# Patient Record
Sex: Male | Born: 1960 | Race: White | Hispanic: No | Marital: Married | Smoking: Former smoker
Health system: Southern US, Community
[De-identification: ages and names within clinical notes are randomized; demographics above are authoritative.]

## PROBLEM LIST (undated history)

## (undated) DIAGNOSIS — N529 Male erectile dysfunction, unspecified: Secondary | ICD-10-CM

## (undated) DIAGNOSIS — F419 Anxiety disorder, unspecified: Secondary | ICD-10-CM

## (undated) DIAGNOSIS — I10 Essential (primary) hypertension: Secondary | ICD-10-CM

## (undated) DIAGNOSIS — R0602 Shortness of breath: Secondary | ICD-10-CM

## (undated) DIAGNOSIS — H1045 Other chronic allergic conjunctivitis: Secondary | ICD-10-CM

## (undated) DIAGNOSIS — E785 Hyperlipidemia, unspecified: Secondary | ICD-10-CM

## (undated) DIAGNOSIS — J309 Allergic rhinitis, unspecified: Secondary | ICD-10-CM

## (undated) DIAGNOSIS — I509 Heart failure, unspecified: Secondary | ICD-10-CM

## (undated) DIAGNOSIS — Z860100 Personal history of colon polyps, unspecified: Secondary | ICD-10-CM

## (undated) DIAGNOSIS — G4733 Obstructive sleep apnea (adult) (pediatric): Secondary | ICD-10-CM

## (undated) DIAGNOSIS — E669 Obesity, unspecified: Secondary | ICD-10-CM

## (undated) DIAGNOSIS — M2550 Pain in unspecified joint: Secondary | ICD-10-CM

## (undated) DIAGNOSIS — IMO0002 Reserved for concepts with insufficient information to code with codable children: Secondary | ICD-10-CM

## (undated) DIAGNOSIS — I5032 Chronic diastolic (congestive) heart failure: Secondary | ICD-10-CM

## (undated) DIAGNOSIS — I2510 Atherosclerotic heart disease of native coronary artery without angina pectoris: Secondary | ICD-10-CM

## (undated) HISTORY — PX: RETINAL DETACHMENT SURGERY: SHX105

## (undated) HISTORY — PX: QUADRICEPS REPAIR: SHX2281

## (undated) HISTORY — DX: Anxiety disorder, unspecified: F41.9

## (undated) HISTORY — PX: APPENDECTOMY: SHX54

## (undated) HISTORY — DX: Obesity, unspecified: E66.9

## (undated) HISTORY — DX: Pain in unspecified joint: M25.50

## (undated) HISTORY — PX: SPINE SURGERY: SHX786

## (undated) HISTORY — DX: Male erectile dysfunction, unspecified: N52.9

## (undated) HISTORY — DX: Obstructive sleep apnea (adult) (pediatric): G47.33

## (undated) HISTORY — PX: TONSILLECTOMY AND ADENOIDECTOMY: SHX28

## (undated) HISTORY — DX: Chronic diastolic (congestive) heart failure: I50.32

## (undated) HISTORY — DX: Personal history of colon polyps, unspecified: Z86.0100

## (undated) HISTORY — PX: QUADRICEPS TENDON REPAIR: SHX756

## (undated) HISTORY — PX: ROTATOR CUFF REPAIR: SHX139

## (undated) HISTORY — DX: Heart failure, unspecified: I50.9

## (undated) HISTORY — PX: CATARACT EXTRACTION: SUR2

## (undated) HISTORY — DX: Reserved for concepts with insufficient information to code with codable children: IMO0002

## (undated) HISTORY — DX: Hyperlipidemia, unspecified: E78.5

## (undated) HISTORY — PX: BACK SURGERY: SHX140

## (undated) HISTORY — DX: Shortness of breath: R06.02

## (undated) HISTORY — DX: Allergic rhinitis, unspecified: J30.9

## (undated) HISTORY — DX: Other chronic allergic conjunctivitis: H10.45

## (undated) HISTORY — DX: Personal history of colonic polyps: Z86.010

## (undated) HISTORY — DX: Essential (primary) hypertension: I10

---
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
## 1978-07-11 HISTORY — PX: OTHER SURGICAL HISTORY: SHX169

## 1997-11-04 ENCOUNTER — Ambulatory Visit (HOSPITAL_COMMUNITY): Admission: RE | Admit: 1997-11-04 | Discharge: 1997-11-04 | Payer: Self-pay | Admitting: *Deleted

## 1997-11-12 ENCOUNTER — Ambulatory Visit (HOSPITAL_COMMUNITY): Admission: RE | Admit: 1997-11-12 | Discharge: 1997-11-12 | Payer: Self-pay | Admitting: *Deleted

## 1997-12-19 ENCOUNTER — Encounter (HOSPITAL_COMMUNITY): Admission: RE | Admit: 1997-12-19 | Discharge: 1998-03-19 | Payer: Self-pay | Admitting: *Deleted

## 1999-05-17 ENCOUNTER — Encounter: Payer: Self-pay | Admitting: Emergency Medicine

## 1999-05-17 ENCOUNTER — Inpatient Hospital Stay (HOSPITAL_COMMUNITY): Admission: EM | Admit: 1999-05-17 | Discharge: 1999-05-19 | Payer: Self-pay | Admitting: Emergency Medicine

## 1999-05-27 ENCOUNTER — Encounter (INDEPENDENT_AMBULATORY_CARE_PROVIDER_SITE_OTHER): Payer: Self-pay | Admitting: Specialist

## 1999-05-27 ENCOUNTER — Ambulatory Visit (HOSPITAL_COMMUNITY): Admission: RE | Admit: 1999-05-27 | Discharge: 1999-05-27 | Payer: Self-pay | Admitting: Gastroenterology

## 2000-05-25 ENCOUNTER — Encounter: Payer: Self-pay | Admitting: Family Medicine

## 2000-05-25 LAB — CONVERTED CEMR LAB
Blood Glucose, Fasting: 134 mg/dL
Hgb A1c MFr Bld: 9.7 %

## 2000-05-26 ENCOUNTER — Encounter: Payer: Self-pay | Admitting: Family Medicine

## 2000-05-26 LAB — CONVERTED CEMR LAB: Microalbumin U total vol: 2.8 mg/L

## 2000-05-31 ENCOUNTER — Encounter: Payer: Self-pay | Admitting: Family Medicine

## 2001-02-23 ENCOUNTER — Encounter: Payer: Self-pay | Admitting: Family Medicine

## 2001-05-30 ENCOUNTER — Encounter: Payer: Self-pay | Admitting: Family Medicine

## 2001-05-30 LAB — CONVERTED CEMR LAB: Blood Glucose, Fasting: 160 mg/dL

## 2001-05-31 ENCOUNTER — Encounter: Payer: Self-pay | Admitting: Family Medicine

## 2001-05-31 LAB — CONVERTED CEMR LAB: Hgb A1c MFr Bld: 9.4 %

## 2001-09-13 ENCOUNTER — Encounter: Payer: Self-pay | Admitting: Family Medicine

## 2001-09-13 LAB — CONVERTED CEMR LAB: RBC count: 4.88 10*6/uL

## 2001-10-05 ENCOUNTER — Inpatient Hospital Stay (HOSPITAL_COMMUNITY): Admission: AD | Admit: 2001-10-05 | Discharge: 2001-10-06 | Payer: Self-pay | Admitting: Cardiology

## 2001-11-23 ENCOUNTER — Encounter: Payer: Self-pay | Admitting: Family Medicine

## 2001-11-23 LAB — CONVERTED CEMR LAB: Hgb A1c MFr Bld: 8.8 %

## 2002-02-12 ENCOUNTER — Encounter: Payer: Self-pay | Admitting: Family Medicine

## 2002-02-12 LAB — CONVERTED CEMR LAB: Hgb A1c MFr Bld: 9.2 %

## 2002-07-31 ENCOUNTER — Encounter: Payer: Self-pay | Admitting: Family Medicine

## 2002-07-31 LAB — CONVERTED CEMR LAB
Hgb A1c MFr Bld: 11.6 %
Microalbumin U total vol: 6.2 mg/L

## 2002-08-01 ENCOUNTER — Encounter: Payer: Self-pay | Admitting: Family Medicine

## 2002-08-01 LAB — CONVERTED CEMR LAB: TSH: 3.13 microintl units/mL

## 2003-01-29 ENCOUNTER — Encounter: Payer: Self-pay | Admitting: Family Medicine

## 2003-05-06 ENCOUNTER — Encounter: Payer: Self-pay | Admitting: Family Medicine

## 2003-05-06 LAB — CONVERTED CEMR LAB
Blood Glucose, Fasting: 174 mg/dL
Hgb A1c MFr Bld: 10.3 %

## 2003-08-06 ENCOUNTER — Encounter: Admission: RE | Admit: 2003-08-06 | Discharge: 2003-08-06 | Payer: Self-pay | Admitting: Neurosurgery

## 2003-08-20 ENCOUNTER — Encounter: Admission: RE | Admit: 2003-08-20 | Discharge: 2003-08-20 | Payer: Self-pay | Admitting: Neurosurgery

## 2003-08-28 ENCOUNTER — Encounter: Admission: RE | Admit: 2003-08-28 | Discharge: 2003-08-28 | Payer: Self-pay | Admitting: Neurosurgery

## 2003-11-11 ENCOUNTER — Encounter: Payer: Self-pay | Admitting: Family Medicine

## 2003-11-13 ENCOUNTER — Inpatient Hospital Stay (HOSPITAL_COMMUNITY): Admission: RE | Admit: 2003-11-13 | Discharge: 2003-11-17 | Payer: Self-pay | Admitting: Neurosurgery

## 2004-05-21 ENCOUNTER — Ambulatory Visit: Payer: Self-pay | Admitting: Family Medicine

## 2004-06-10 ENCOUNTER — Ambulatory Visit: Payer: Self-pay | Admitting: Family Medicine

## 2004-06-10 LAB — CONVERTED CEMR LAB
Blood Glucose, Fasting: 280 mg/dL
Microalbumin U total vol: 3.3 mg/L

## 2004-06-14 ENCOUNTER — Ambulatory Visit: Payer: Self-pay | Admitting: Family Medicine

## 2004-08-02 ENCOUNTER — Ambulatory Visit (HOSPITAL_COMMUNITY): Admission: RE | Admit: 2004-08-02 | Discharge: 2004-08-02 | Payer: Self-pay | Admitting: Gastroenterology

## 2004-08-16 ENCOUNTER — Ambulatory Visit (HOSPITAL_COMMUNITY): Admission: RE | Admit: 2004-08-16 | Discharge: 2004-08-16 | Payer: Self-pay | Admitting: Gastroenterology

## 2004-09-08 ENCOUNTER — Ambulatory Visit: Payer: Self-pay | Admitting: Family Medicine

## 2004-09-13 ENCOUNTER — Ambulatory Visit: Payer: Self-pay | Admitting: Family Medicine

## 2004-10-26 ENCOUNTER — Ambulatory Visit: Payer: Self-pay | Admitting: Family Medicine

## 2004-12-31 ENCOUNTER — Ambulatory Visit: Payer: Self-pay | Admitting: Family Medicine

## 2005-01-03 ENCOUNTER — Ambulatory Visit: Payer: Self-pay | Admitting: Family Medicine

## 2005-01-12 ENCOUNTER — Ambulatory Visit: Payer: Self-pay | Admitting: Family Medicine

## 2005-01-21 ENCOUNTER — Ambulatory Visit: Payer: Self-pay | Admitting: Family Medicine

## 2005-03-08 ENCOUNTER — Ambulatory Visit: Payer: Self-pay | Admitting: Family Medicine

## 2005-05-11 ENCOUNTER — Ambulatory Visit: Payer: Self-pay | Admitting: Family Medicine

## 2005-06-08 ENCOUNTER — Ambulatory Visit: Payer: Self-pay | Admitting: Family Medicine

## 2005-06-08 LAB — CONVERTED CEMR LAB
Blood Glucose, Fasting: 111 mg/dL
Hgb A1c MFr Bld: 9.2 %

## 2005-06-10 ENCOUNTER — Ambulatory Visit: Payer: Self-pay | Admitting: Family Medicine

## 2005-09-06 ENCOUNTER — Ambulatory Visit: Payer: Self-pay | Admitting: Family Medicine

## 2005-09-08 ENCOUNTER — Ambulatory Visit: Payer: Self-pay | Admitting: Family Medicine

## 2006-07-24 ENCOUNTER — Ambulatory Visit: Payer: Self-pay | Admitting: Family Medicine

## 2006-07-24 LAB — CONVERTED CEMR LAB
ALT: 17 units/L (ref 0–40)
AST: 14 units/L (ref 0–37)
Albumin: 3.5 g/dL (ref 3.5–5.2)
Alkaline Phosphatase: 61 units/L (ref 39–117)
Chloride: 104 meq/L (ref 96–112)
Glucose, Bld: 260 mg/dL — ABNORMAL HIGH (ref 70–99)
HDL: 40 mg/dL (ref >39.0)
LDL Cholesterol: 108 mg/dL — ABNORMAL HIGH (ref 0–99)
Microalb, Ur: 1 mg/dL (ref 0.0–1.9)
Potassium: 4.4 meq/L (ref 3.5–5.1)
Sodium: 140 meq/L (ref 135–145)
TSH: 3.74 microintl units/mL (ref 0.35–5.50)
Total Bilirubin: 0.7 mg/dL (ref 0.3–1.2)
VLDL: 18 mg/dL (ref 0–40)

## 2006-07-28 ENCOUNTER — Ambulatory Visit: Payer: Self-pay | Admitting: Family Medicine

## 2006-08-11 ENCOUNTER — Ambulatory Visit: Payer: Self-pay | Admitting: Family Medicine

## 2006-12-02 ENCOUNTER — Encounter: Admission: RE | Admit: 2006-12-02 | Discharge: 2006-12-02 | Payer: Self-pay | Admitting: Orthopaedic Surgery

## 2006-12-22 ENCOUNTER — Ambulatory Visit (HOSPITAL_COMMUNITY): Admission: RE | Admit: 2006-12-22 | Discharge: 2006-12-22 | Payer: Self-pay | Admitting: Orthopaedic Surgery

## 2007-02-14 ENCOUNTER — Ambulatory Visit: Payer: Self-pay | Admitting: Family Medicine

## 2007-02-15 ENCOUNTER — Ambulatory Visit: Payer: Self-pay | Admitting: Physical Medicine & Rehabilitation

## 2007-02-17 ENCOUNTER — Ambulatory Visit: Payer: Self-pay | Admitting: Family Medicine

## 2007-02-17 ENCOUNTER — Inpatient Hospital Stay (HOSPITAL_COMMUNITY): Admission: EM | Admit: 2007-02-17 | Discharge: 2007-02-20 | Payer: Self-pay | Admitting: Emergency Medicine

## 2007-02-27 ENCOUNTER — Ambulatory Visit: Payer: Self-pay | Admitting: Family Medicine

## 2007-03-27 ENCOUNTER — Emergency Department (HOSPITAL_COMMUNITY): Admission: EM | Admit: 2007-03-27 | Discharge: 2007-03-27 | Payer: Self-pay | Admitting: Emergency Medicine

## 2007-03-29 ENCOUNTER — Encounter: Admission: RE | Admit: 2007-03-29 | Discharge: 2007-03-29 | Payer: Self-pay | Admitting: Orthopaedic Surgery

## 2007-04-02 ENCOUNTER — Inpatient Hospital Stay (HOSPITAL_COMMUNITY): Admission: RE | Admit: 2007-04-02 | Discharge: 2007-04-04 | Payer: Self-pay | Admitting: Orthopaedic Surgery

## 2007-11-30 ENCOUNTER — Encounter: Payer: Self-pay | Admitting: Family Medicine

## 2007-11-30 DIAGNOSIS — E785 Hyperlipidemia, unspecified: Secondary | ICD-10-CM | POA: Insufficient documentation

## 2007-11-30 DIAGNOSIS — E669 Obesity, unspecified: Secondary | ICD-10-CM | POA: Insufficient documentation

## 2007-11-30 DIAGNOSIS — I10 Essential (primary) hypertension: Secondary | ICD-10-CM | POA: Insufficient documentation

## 2007-11-30 DIAGNOSIS — K3184 Gastroparesis: Secondary | ICD-10-CM | POA: Insufficient documentation

## 2008-03-31 ENCOUNTER — Encounter: Admission: RE | Admit: 2008-03-31 | Discharge: 2008-03-31 | Payer: Self-pay | Admitting: Neurosurgery

## 2008-08-04 ENCOUNTER — Inpatient Hospital Stay (HOSPITAL_COMMUNITY): Admission: RE | Admit: 2008-08-04 | Discharge: 2008-08-08 | Payer: Self-pay | Admitting: Neurosurgery

## 2008-08-04 ENCOUNTER — Ambulatory Visit: Payer: Self-pay | Admitting: Internal Medicine

## 2008-12-31 ENCOUNTER — Telehealth: Payer: Self-pay | Admitting: Family Medicine

## 2009-01-08 HISTORY — PX: OTHER SURGICAL HISTORY: SHX169

## 2009-01-10 ENCOUNTER — Observation Stay (HOSPITAL_COMMUNITY): Admission: EM | Admit: 2009-01-10 | Discharge: 2009-01-11 | Payer: Self-pay | Admitting: Emergency Medicine

## 2009-01-10 ENCOUNTER — Ambulatory Visit: Payer: Self-pay | Admitting: Internal Medicine

## 2009-02-05 ENCOUNTER — Ambulatory Visit (HOSPITAL_COMMUNITY): Admission: RE | Admit: 2009-02-05 | Discharge: 2009-02-06 | Payer: Self-pay | Admitting: Cardiology

## 2009-06-03 IMAGING — CR DG KNEE 4+V BILAT
8 series · 8 of 8 positions shown · non-contrast
Comparison: None.

CLINICAL DATA: Trauma. 
LEFT KNEE ? 4 VIEW:

[t knee ap left]
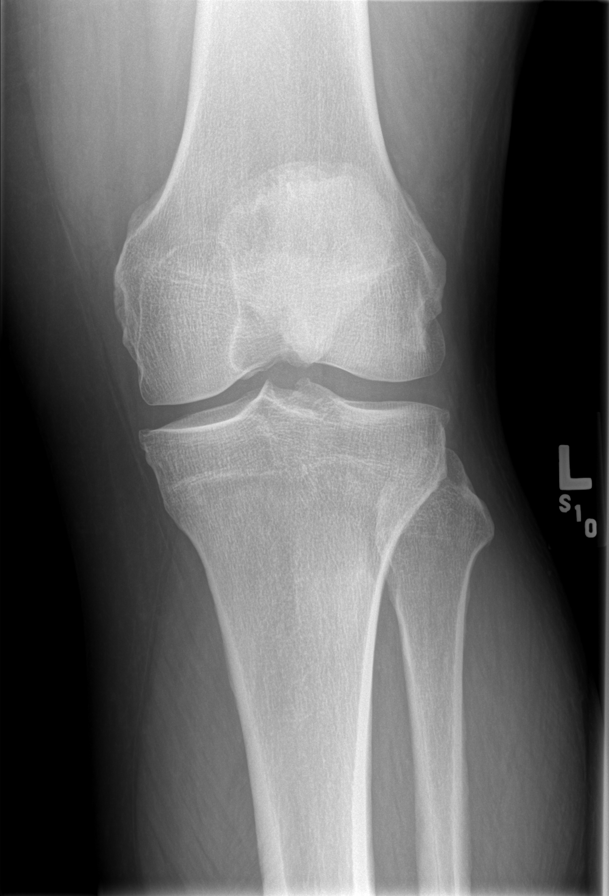

[t knee oblique left (1 of 2)]
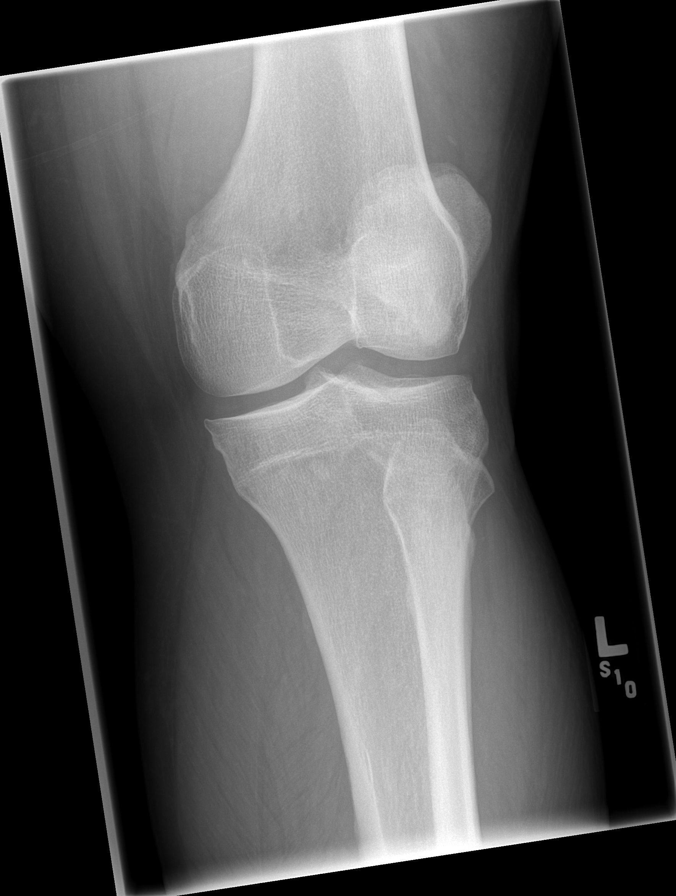

[t knee oblique left (2 of 2)]
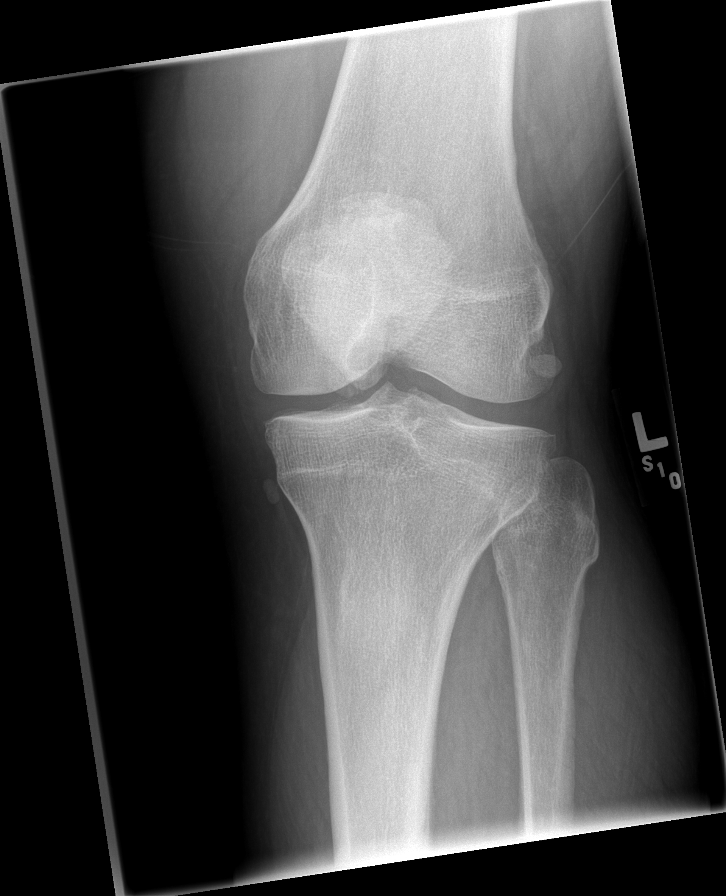

[t knee ap right]
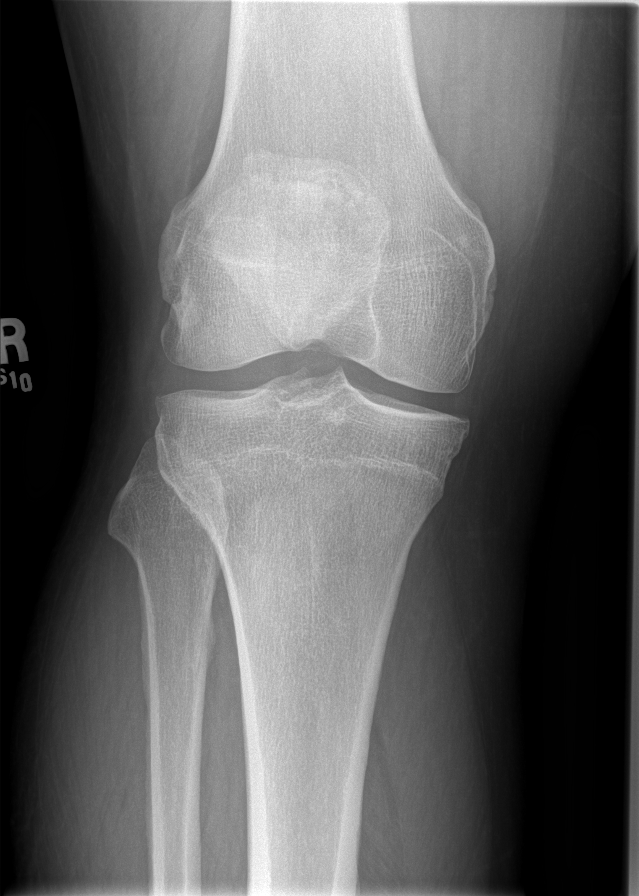

[t knee oblique right (1 of 2)]
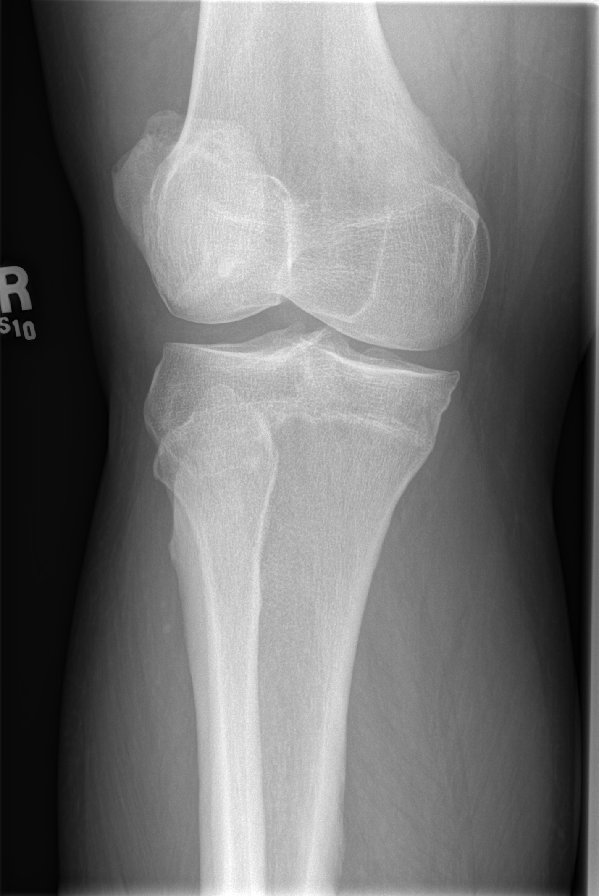

[t knee oblique right (2 of 2)]
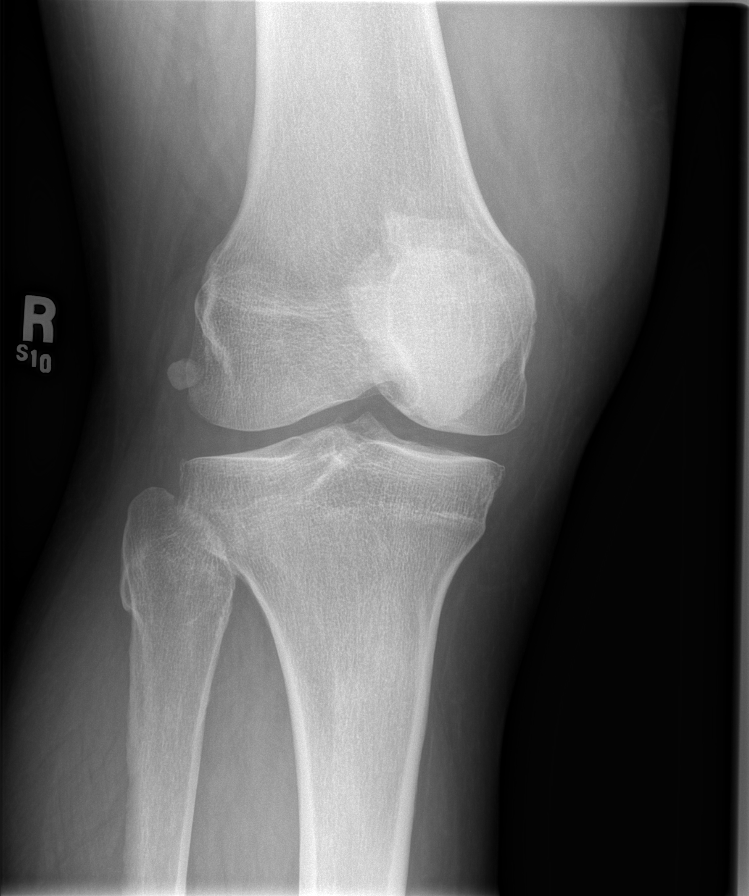

[t knee lat right]
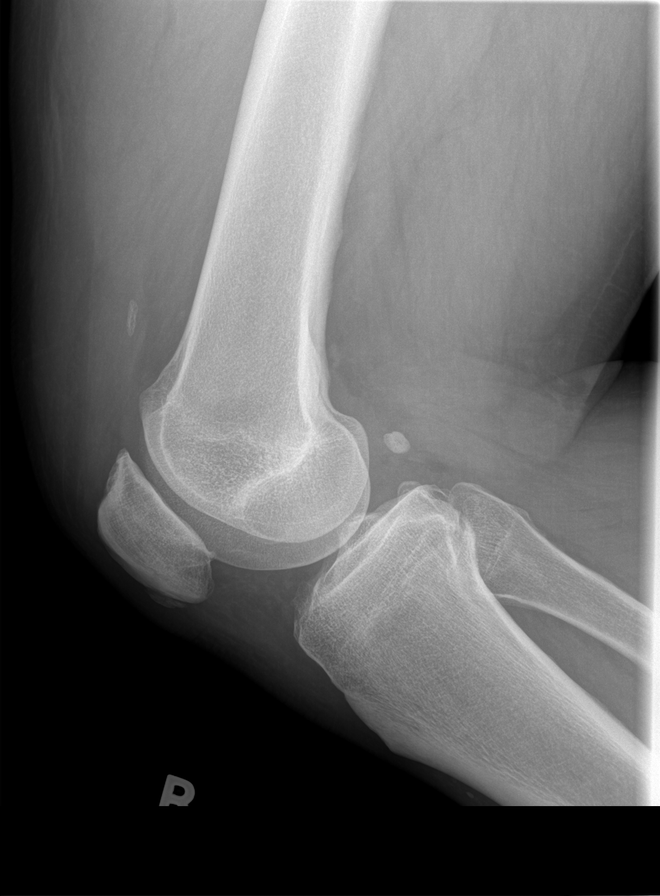

[t knee lat left]
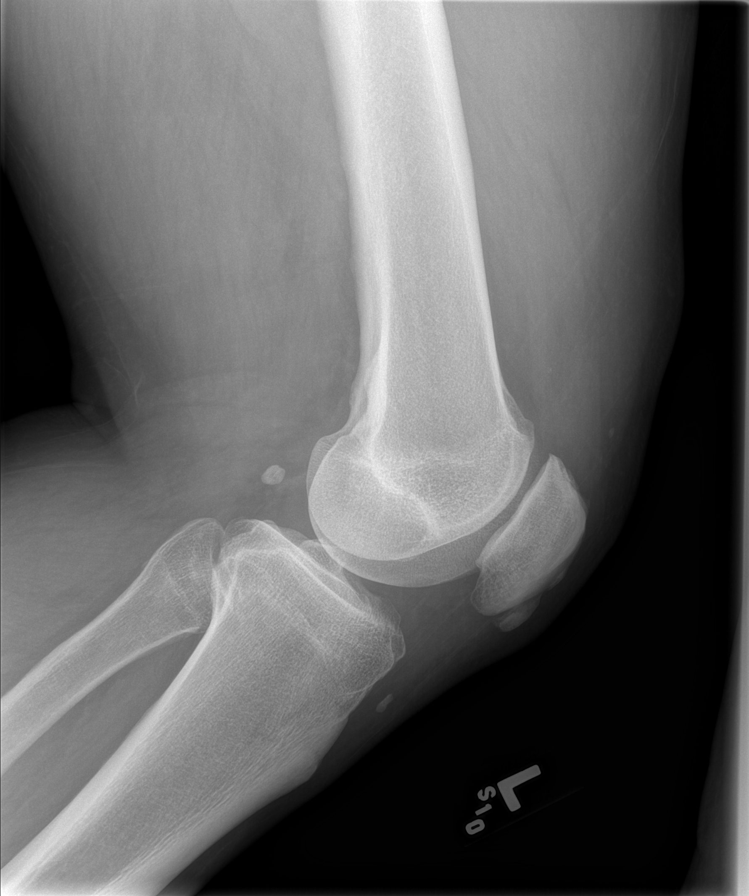

[8 of 8 positions shown; findings below may reference images not displayed]

Mild joint space narrowing involves the medial and lateral compartments.   Insertional enthesophyte at the patellar origin.   No acute fracture, dislocation, or joint effusion.
IMPRESSION: Degenerative change without acute finding in the left knee.
RIGHT KNEE ? 4 VIEW:  Mild medial and lateral compartment degenerative change.   Cannot exclude small joint effusion.   Increased density in distal quadriceps likely related to tendinopathy.   Mild patellofemoral joint osteoarthritis as well.
IMPRESSION: Mild degenerative changes three compartment without acute finding.

## 2009-06-11 ENCOUNTER — Telehealth: Payer: Self-pay | Admitting: Family Medicine

## 2009-06-26 ENCOUNTER — Ambulatory Visit (HOSPITAL_COMMUNITY): Admission: RE | Admit: 2009-06-26 | Discharge: 2009-06-26 | Payer: Self-pay | Admitting: Surgery

## 2009-07-01 ENCOUNTER — Ambulatory Visit (HOSPITAL_COMMUNITY): Admission: RE | Admit: 2009-07-01 | Discharge: 2009-07-01 | Payer: Self-pay | Admitting: Surgery

## 2009-07-11 HISTORY — PX: COLONOSCOPY: SHX174

## 2009-08-04 ENCOUNTER — Encounter: Admission: RE | Admit: 2009-08-04 | Discharge: 2009-11-02 | Payer: Self-pay | Admitting: Surgery

## 2009-09-15 ENCOUNTER — Ambulatory Visit (HOSPITAL_COMMUNITY): Admission: RE | Admit: 2009-09-15 | Discharge: 2009-09-16 | Payer: Self-pay | Admitting: Surgery

## 2009-09-15 HISTORY — PX: LAPAROSCOPIC GASTRIC BANDING: SHX1100

## 2009-11-17 ENCOUNTER — Encounter: Admission: RE | Admit: 2009-11-17 | Discharge: 2009-11-17 | Payer: Self-pay | Admitting: Surgery

## 2010-01-25 ENCOUNTER — Encounter: Admission: RE | Admit: 2010-01-25 | Discharge: 2010-01-25 | Payer: Self-pay | Admitting: Surgery

## 2010-07-11 HISTORY — PX: OTHER SURGICAL HISTORY: SHX169

## 2010-10-04 LAB — CBC
HCT: 35.1 % — ABNORMAL LOW (ref 39.0–52.0)
HCT: 41.4 % (ref 39.0–52.0)
Hemoglobin: 13.3 g/dL (ref 13.0–17.0)
MCHC: 32.2 g/dL (ref 30.0–36.0)
MCHC: 32.8 g/dL (ref 30.0–36.0)
MCV: 88.9 fL (ref 78.0–100.0)
MCV: 90.3 fL (ref 78.0–100.0)
RBC: 3.88 MIL/uL — ABNORMAL LOW (ref 4.22–5.81)
RBC: 4.65 MIL/uL (ref 4.22–5.81)
WBC: 6.2 10*3/uL (ref 4.0–10.5)

## 2010-10-04 LAB — COMPREHENSIVE METABOLIC PANEL
ALT: 22 U/L (ref 0–53)
CO2: 29 mEq/L (ref 19–32)
Calcium: 9.4 mg/dL (ref 8.4–10.5)
Creatinine, Ser: 0.81 mg/dL (ref 0.4–1.5)
GFR calc non Af Amer: >60 mL/min (ref >60)
Glucose, Bld: 90 mg/dL (ref 70–99)
Sodium: 140 mEq/L (ref 135–145)

## 2010-10-04 LAB — GLUCOSE, CAPILLARY: Glucose-Capillary: 148 mg/dL — ABNORMAL HIGH (ref 70–99)

## 2010-10-04 LAB — DIFFERENTIAL
Basophils Relative: 1 % (ref 0–1)
Eosinophils Absolute: 0.1 10*3/uL (ref 0.0–0.7)
Eosinophils Absolute: 0.2 10*3/uL (ref 0.0–0.7)
Eosinophils Relative: 3 % (ref 0–5)
Lymphs Abs: 2.5 10*3/uL (ref 0.7–4.0)
Lymphs Abs: 2.7 10*3/uL (ref 0.7–4.0)
Monocytes Absolute: 0.5 10*3/uL (ref 0.1–1.0)
Monocytes Relative: 9 % (ref 3–12)
Neutrophils Relative %: 48 % (ref 43–77)
Neutrophils Relative %: 69 % (ref 43–77)

## 2010-10-04 LAB — HEMOGLOBIN AND HEMATOCRIT, BLOOD
HCT: 37.2 % — ABNORMAL LOW (ref 39.0–52.0)
Hemoglobin: 12.1 g/dL — ABNORMAL LOW (ref 13.0–17.0)

## 2010-10-17 LAB — BASIC METABOLIC PANEL
BUN: 17 mg/dL (ref 6–23)
BUN: 18 mg/dL (ref 6–23)
CO2: 30 mEq/L (ref 19–32)
CO2: 27 mEq/L (ref 19–32)
Chloride: 101 mEq/L (ref 96–112)
Chloride: 103 mEq/L (ref 96–112)
Chloride: 103 mEq/L (ref 96–112)
Creatinine, Ser: 0.78 mg/dL (ref 0.4–1.5)
GFR calc Af Amer: >60 mL/min (ref >60)
GFR calc Af Amer: >60 mL/min (ref >60)
GFR calc non Af Amer: >60 mL/min (ref >60)
Glucose, Bld: 234 mg/dL — ABNORMAL HIGH (ref 70–99)
Potassium: 3.9 mEq/L (ref 3.5–5.1)
Potassium: 4.3 mEq/L (ref 3.5–5.1)
Sodium: 139 mEq/L (ref 135–145)

## 2010-10-17 LAB — LIPID PANEL
Cholesterol: 108 mg/dL (ref 0–200)
HDL: 43 mg/dL (ref >39)
HDL: 36 mg/dL — ABNORMAL LOW (ref >39)
LDL Cholesterol: 58 mg/dL (ref 0–99)
Total CHOL/HDL Ratio: 2.6 RATIO
Total CHOL/HDL Ratio: 3 RATIO
Triglycerides: 44 mg/dL (ref <150)
Triglycerides: 68 mg/dL (ref <150)
VLDL: 14 mg/dL (ref 0–40)

## 2010-10-17 LAB — POCT I-STAT, CHEM 8
Calcium, Ion: 1.14 mmol/L (ref 1.12–1.32)
Chloride: 98 mEq/L (ref 96–112)
HCT: 45 % (ref 39.0–52.0)
Potassium: 4.2 mEq/L (ref 3.5–5.1)
Sodium: 141 mEq/L (ref 135–145)

## 2010-10-17 LAB — URINALYSIS, ROUTINE W REFLEX MICROSCOPIC
Glucose, UA: >1000 mg/dL — AB
Hgb urine dipstick: NEGATIVE
Specific Gravity, Urine: 1.031 — ABNORMAL HIGH (ref 1.005–1.030)

## 2010-10-17 LAB — CBC
HCT: 37.1 % — ABNORMAL LOW (ref 39.0–52.0)
HCT: 38 % — ABNORMAL LOW (ref 39.0–52.0)
HCT: 35.7 % — ABNORMAL LOW (ref 39.0–52.0)
Hemoglobin: 12.9 g/dL — ABNORMAL LOW (ref 13.0–17.0)
Hemoglobin: 12.3 g/dL — ABNORMAL LOW (ref 13.0–17.0)
MCHC: 34.1 g/dL (ref 30.0–36.0)
MCHC: 34.5 g/dL (ref 30.0–36.0)
MCV: 87.6 fL (ref 78.0–100.0)
MCV: 88.5 fL (ref 78.0–100.0)
MCV: 88.2 fL (ref 78.0–100.0)
Platelets: 239 10*3/uL (ref 150–400)
Platelets: 261 10*3/uL (ref 150–400)
Platelets: 231 10*3/uL (ref 150–400)
Platelets: 289 10*3/uL (ref 150–400)
RBC: 4.24 MIL/uL (ref 4.22–5.81)
RBC: 4.3 MIL/uL (ref 4.22–5.81)
RBC: 4.05 MIL/uL — ABNORMAL LOW (ref 4.22–5.81)
RDW: 14.9 % (ref 11.5–15.5)
WBC: 5.5 10*3/uL (ref 4.0–10.5)
WBC: 6.4 10*3/uL (ref 4.0–10.5)
WBC: 10.4 10*3/uL (ref 4.0–10.5)
WBC: 4.3 10*3/uL (ref 4.0–10.5)

## 2010-10-17 LAB — DIFFERENTIAL
Eosinophils Absolute: 0.1 10*3/uL (ref 0.0–0.7)
Lymphocytes Relative: 19 % (ref 12–46)
Lymphs Abs: 2 10*3/uL (ref 0.7–4.0)
Neutro Abs: 8 10*3/uL — ABNORMAL HIGH (ref 1.7–7.7)
Neutrophils Relative %: 77 % (ref 43–77)

## 2010-10-17 LAB — GLUCOSE, CAPILLARY
Glucose-Capillary: 256 mg/dL — ABNORMAL HIGH (ref 70–99)
Glucose-Capillary: 263 mg/dL — ABNORMAL HIGH (ref 70–99)
Glucose-Capillary: 271 mg/dL — ABNORMAL HIGH (ref 70–99)

## 2010-10-17 LAB — HEPATIC FUNCTION PANEL
ALT: 15 U/L (ref 0–53)
Albumin: 3.3 g/dL — ABNORMAL LOW (ref 3.5–5.2)
Alkaline Phosphatase: 67 U/L (ref 39–117)
Indirect Bilirubin: 0.6 mg/dL (ref 0.3–0.9)
Total Protein: 6.9 g/dL (ref 6.0–8.3)

## 2010-10-17 LAB — CARDIAC PANEL(CRET KIN+CKTOT+MB+TROPI)
CK, MB: 1.7 ng/mL (ref 0.3–4.0)
Total CK: 102 U/L (ref 7–232)
Troponin I: 0.01 ng/mL (ref 0.00–0.06)
Troponin I: 0.02 ng/mL (ref 0.00–0.06)

## 2010-10-17 LAB — URINE MICROSCOPIC-ADD ON

## 2010-10-17 LAB — POCT I-STAT 3, ART BLOOD GAS (G3+)
Acid-Base Excess: 3 mmol/L — ABNORMAL HIGH (ref 0.0–2.0)
Bicarbonate: 27.8 mEq/L — ABNORMAL HIGH (ref 20.0–24.0)
Patient temperature: 98

## 2010-10-17 LAB — PROTIME-INR
INR: 1.1 (ref 0.00–1.49)
INR: 1.1 (ref 0.00–1.49)
Prothrombin Time: 14.5 seconds (ref 11.6–15.2)
Prothrombin Time: 15 seconds (ref 11.6–15.2)

## 2010-10-17 LAB — URINE DRUGS OF ABUSE SCREEN W ALC, ROUTINE (REF LAB)
Amphetamine Screen, Ur: NEGATIVE
Barbiturate Quant, Ur: NEGATIVE
Creatinine,U: 174.7 mg/dL
Ethyl Alcohol: <10 mg/dL (ref <10)
Marijuana Metabolite: NEGATIVE
Methadone: NEGATIVE
Phencyclidine (PCP): NEGATIVE

## 2010-10-17 LAB — BRAIN NATRIURETIC PEPTIDE: Pro B Natriuretic peptide (BNP): <30 pg/mL (ref 0.0–100.0)

## 2010-10-17 LAB — APTT: aPTT: 31 seconds (ref 24–37)

## 2010-10-17 LAB — HEMOGLOBIN A1C: Hgb A1c MFr Bld: 10.3 % — ABNORMAL HIGH (ref 4.6–6.1)

## 2010-10-17 LAB — TSH: TSH: 2.121 u[IU]/mL (ref 0.350–4.500)

## 2010-10-17 LAB — D-DIMER, QUANTITATIVE: D-Dimer, Quant: 2.73 ug/mL-FEU — ABNORMAL HIGH (ref 0.00–0.48)

## 2010-10-17 LAB — POCT CARDIAC MARKERS: Troponin i, poc: <0.05 ng/mL (ref 0.00–0.09)

## 2010-10-25 LAB — GLUCOSE, CAPILLARY
Glucose-Capillary: 101 mg/dL — ABNORMAL HIGH (ref 70–99)
Glucose-Capillary: 156 mg/dL — ABNORMAL HIGH (ref 70–99)
Glucose-Capillary: 161 mg/dL — ABNORMAL HIGH (ref 70–99)
Glucose-Capillary: 179 mg/dL — ABNORMAL HIGH (ref 70–99)
Glucose-Capillary: 192 mg/dL — ABNORMAL HIGH (ref 70–99)
Glucose-Capillary: 199 mg/dL — ABNORMAL HIGH (ref 70–99)
Glucose-Capillary: 227 mg/dL — ABNORMAL HIGH (ref 70–99)
Glucose-Capillary: 227 mg/dL — ABNORMAL HIGH (ref 70–99)
Glucose-Capillary: 230 mg/dL — ABNORMAL HIGH (ref 70–99)
Glucose-Capillary: 231 mg/dL — ABNORMAL HIGH (ref 70–99)
Glucose-Capillary: 60 mg/dL — ABNORMAL LOW (ref 70–99)
Glucose-Capillary: 81 mg/dL (ref 70–99)
Glucose-Capillary: 90 mg/dL (ref 70–99)
Glucose-Capillary: 97 mg/dL (ref 70–99)

## 2010-10-25 LAB — ABO/RH: ABO/RH(D): A POS

## 2010-10-25 LAB — DIFFERENTIAL
Basophils Relative: 1 % (ref 0–1)
Monocytes Absolute: 0.5 10*3/uL (ref 0.1–1.0)
Monocytes Relative: 6 % (ref 3–12)
Neutro Abs: 6.7 10*3/uL (ref 1.7–7.7)

## 2010-10-25 LAB — BLOOD GAS, ARTERIAL
Acid-Base Excess: 2.3 mmol/L — ABNORMAL HIGH (ref 0.0–2.0)
Acid-base deficit: 0.1 mmol/L (ref 0.0–2.0)
Bicarbonate: 26.3 mEq/L — ABNORMAL HIGH (ref 20.0–24.0)
FIO2: 0.3 %
FIO2: 0.4 %
MECHVT: 600 mL
O2 Saturation: 98 %
Patient temperature: 97.7
Patient temperature: 98.6
Pressure support: 5 cmH2O
TCO2: 25.9 mmol/L (ref 0–100)
TCO2: 27.5 mmol/L (ref 0–100)

## 2010-10-25 LAB — COMPREHENSIVE METABOLIC PANEL
Albumin: 3.4 g/dL — ABNORMAL LOW (ref 3.5–5.2)
Alkaline Phosphatase: 54 U/L (ref 39–117)
BUN: 27 mg/dL — ABNORMAL HIGH (ref 6–23)
GFR calc Af Amer: >60 mL/min (ref >60)
Potassium: 4.5 mEq/L (ref 3.5–5.1)
Sodium: 139 mEq/L (ref 135–145)
Total Protein: 6.8 g/dL (ref 6.0–8.3)

## 2010-10-25 LAB — BASIC METABOLIC PANEL
BUN: 10 mg/dL (ref 6–23)
Calcium: 7.6 mg/dL — ABNORMAL LOW (ref 8.4–10.5)
Chloride: 107 mEq/L (ref 96–112)
Creatinine, Ser: 0.68 mg/dL (ref 0.4–1.5)
Creatinine, Ser: 0.69 mg/dL (ref 0.4–1.5)
GFR calc Af Amer: >60 mL/min (ref >60)
GFR calc non Af Amer: >60 mL/min (ref >60)
GFR calc non Af Amer: >60 mL/min (ref >60)
Glucose, Bld: 110 mg/dL — ABNORMAL HIGH (ref 70–99)
Potassium: 3.7 mEq/L (ref 3.5–5.1)

## 2010-10-25 LAB — URINE MICROSCOPIC-ADD ON

## 2010-10-25 LAB — TYPE AND SCREEN: ABO/RH(D): A POS

## 2010-10-25 LAB — APTT: aPTT: 28 seconds (ref 24–37)

## 2010-10-25 LAB — URINALYSIS, ROUTINE W REFLEX MICROSCOPIC
Leukocytes, UA: NEGATIVE
Nitrite: NEGATIVE
Specific Gravity, Urine: 1.035 — ABNORMAL HIGH (ref 1.005–1.030)
pH: 5 (ref 5.0–8.0)

## 2010-10-25 LAB — CBC
HCT: 40.7 % (ref 39.0–52.0)
Platelets: 234 10*3/uL (ref 150–400)
Platelets: 316 10*3/uL (ref 150–400)
RDW: 14.1 % (ref 11.5–15.5)
RDW: 14.2 % (ref 11.5–15.5)
WBC: 7.1 10*3/uL (ref 4.0–10.5)

## 2010-10-25 LAB — POCT I-STAT GLUCOSE
Glucose, Bld: 106 mg/dL — ABNORMAL HIGH (ref 70–99)
Operator id: 117072

## 2010-10-25 LAB — MAGNESIUM: Magnesium: 1.6 mg/dL (ref 1.5–2.5)

## 2010-10-25 LAB — POCT I-STAT 4, (NA,K, GLUC, HGB,HCT): Sodium: 141 mEq/L (ref 135–145)

## 2010-10-31 ENCOUNTER — Encounter (HOSPITAL_COMMUNITY): Payer: Self-pay | Admitting: Radiology

## 2010-10-31 ENCOUNTER — Emergency Department (HOSPITAL_COMMUNITY): Payer: BC Managed Care – PPO

## 2010-10-31 ENCOUNTER — Emergency Department (HOSPITAL_COMMUNITY)
Admission: EM | Admit: 2010-10-31 | Discharge: 2010-10-31 | Disposition: A | Discharge status: Home / Self Care | Payer: BC Managed Care – PPO | Attending: Emergency Medicine | Admitting: Emergency Medicine

## 2010-10-31 DIAGNOSIS — R079 Chest pain, unspecified: Secondary | ICD-10-CM | POA: Insufficient documentation

## 2010-10-31 DIAGNOSIS — R0989 Other specified symptoms and signs involving the circulatory and respiratory systems: Secondary | ICD-10-CM | POA: Insufficient documentation

## 2010-10-31 DIAGNOSIS — Z79899 Other long term (current) drug therapy: Secondary | ICD-10-CM | POA: Insufficient documentation

## 2010-10-31 DIAGNOSIS — R0609 Other forms of dyspnea: Secondary | ICD-10-CM | POA: Insufficient documentation

## 2010-10-31 DIAGNOSIS — L298 Other pruritus: Secondary | ICD-10-CM | POA: Insufficient documentation

## 2010-10-31 DIAGNOSIS — I10 Essential (primary) hypertension: Secondary | ICD-10-CM | POA: Insufficient documentation

## 2010-10-31 DIAGNOSIS — E119 Type 2 diabetes mellitus without complications: Secondary | ICD-10-CM | POA: Insufficient documentation

## 2010-10-31 DIAGNOSIS — E7800 Pure hypercholesterolemia, unspecified: Secondary | ICD-10-CM | POA: Insufficient documentation

## 2010-10-31 DIAGNOSIS — R0602 Shortness of breath: Secondary | ICD-10-CM | POA: Insufficient documentation

## 2010-10-31 DIAGNOSIS — R61 Generalized hyperhidrosis: Secondary | ICD-10-CM | POA: Insufficient documentation

## 2010-10-31 DIAGNOSIS — L2989 Other pruritus: Secondary | ICD-10-CM | POA: Insufficient documentation

## 2010-10-31 LAB — POCT I-STAT, CHEM 8
BUN: 25 mg/dL — ABNORMAL HIGH (ref 6–23)
Calcium, Ion: 1.15 mmol/L (ref 1.12–1.32)
Chloride: 103 mEq/L (ref 96–112)
Creatinine, Ser: 1.1 mg/dL (ref 0.4–1.5)
Glucose, Bld: 224 mg/dL — ABNORMAL HIGH (ref 70–99)
HCT: 43 % (ref 39.0–52.0)
Hemoglobin: 14.6 g/dL (ref 13.0–17.0)
Potassium: 4.1 mEq/L (ref 3.5–5.1)
Sodium: 142 mEq/L (ref 135–145)
TCO2: 30 mmol/L (ref 0–100)

## 2010-10-31 LAB — POCT CARDIAC MARKERS
CKMB, poc: 1.2 ng/mL (ref 1.0–8.0)
Myoglobin, poc: 93.9 ng/mL (ref 12–200)

## 2010-10-31 LAB — D-DIMER, QUANTITATIVE: D-Dimer, Quant: 1.69 ug/mL-FEU — ABNORMAL HIGH (ref 0.00–0.48)

## 2010-10-31 MED ORDER — IOHEXOL 350 MG/ML SOLN
80.0000 mL | Freq: Once | INTRAVENOUS | Status: AC | PRN
  Administered 2010-10-31: 80 mL via INTRAVENOUS

## 2010-11-23 NOTE — Op Note (Signed)
[name], [name]                 ACCOUNT NO.:  [id number]   MEDICAL RECORD NO.:  [id number]          PATIENT TYPE:  OIB   LOCATION:  5019                         FACILITY:  [facility]   PHYSICIAN:  Mark C. Ophelia Charter, M.D.    DATE OF BIRTH:  [birth year]   DATE OF PROCEDURE:  04/02/2007  DATE OF DISCHARGE:                               OPERATIVE REPORT   POSTOPERATIVE DIAGNOSIS:  On-the-job injury with acute left quadriceps  rupture.   PREOPERATIVE DIAGNOSIS:  On-the-job injury with acute left quadriceps  rupture.   PROCEDURE:  Repair of left quadriceps tendon rupture.   SURGEON:  Mark C. Ophelia Charter, M.D.   ANESTHESIA:  General MAL plus femoral block preoperatively.   DRAINS:  One Hemovac.   TOURNIQUET TIME:  1 hour 11 minutes x 400.   BRIEF HISTORY:  A 50 year old male with previous injury to both legs  with a fall requiring hospitalization.  He has also had a right shoulder  rotator cuff repaired back in June 2008.  The patient was in the  hospital after a sprawl, with bruising to both quads.  He had admission  to the hospital due to immobility.  He had a fall at work on February 14, 2007 when he was walking down the slopes, stepped in a hole with his  left foot causing him to fall.  He felt pain in both the knee and distal  quad.  He was seen by Physical Therapy and was ambulatory, and had  finally been back at work doing work activity.  He was fully ambulatory,  when he stepped off a step and felt acute sharp pain in his left quad.  He described knee hyperextension with sharp quad contracture at the same  time, with sharp pain and pop; and inability to extend his knee  afterwards.  The MRI scan was performed, which showed complete  quadriceps rupture on the left.   PROCEDURE:  After induction of general anesthesia with preoperative  femoral nerve block performed by the Anesthesia staff, preoperative  vancomycin and time-out procedure after prepping.  The left lower  extremity proximal  thigh tourniquet had been applied.  Usual impervious  stockinette Coban and extremity sheets and drapes were applied.  Sterile  skin marker for midline incision. Betadine Vi-Drape application and  wrapping the leg with an Esmarch.  Time-out procedure was done.  Incision was made midline.  There was extensive hemorrhage in the  subcutaneous tissue, with the quad fascia stuck to the subcutaneous  tissue.  There was a quad rupture of hematoma present, which was just  above the synovial layer; and had both vertical and transverse  components to it.  VMO was evulsed as was the lateralis and rectus  femoris.  Synovium was opened, there was clear fluid inside the knee,  and the MRI scan (which appeared to show a large hemarthrosis) was  actually in the layer just above the synovium.  Lateral ligaments were  stable on exam.  Hematoma was irrigated and evacuated using suction,  removing large clumps of clotted blood.  Then #2 FiberWire was  used with  Dalbert Mayotte, placing 3 Bunnell weaves in the VMO portion, 2 in the  vastus lateralis, and then drilling multiple holes through the patella -  - passing a large Keith needle through the holes.  The 2 lateral holes  had a single suture, and each of the holes in the middle had 2 sutures  passed; so that when the tendon was pulled down directly down to the  patella.  The fiber wire sutures were tied directly over bone.  The quad  tendon was pulled down; this actually primarily pulled the patellar  tendon back up to the quad tendon, and then sutures were tied keeping  tension on the others until all were repaired.  The FiberWire was cut  with the scalpel.   A side-to-side repair and the midline split was performed with FiberWire  horizontal mattress and figure-of-eight sutures.  Additional 0 Vicryls  were placed in between.  The superficial retinaculum was repaired with 2-  0 Vicryl.  Hemovac drain was placed prior to closure, with a separate  stab  incision using the trocar with in-and-out technique.  Skin was  closed with staples.  Xeroform, 4x4s, ABDs, Webril and 26-inch knee  immobilizer was applied securely.  The patient tolerated the procedure  well and as transferred to recovery room.  He will be admitted for  postoperative rehabilitation due to his large size at 360 or 370 pounds;  with his previous repaired rotator cuff and also with the opposite leg  showing some quad strain.      Mark C. Ophelia Charter, M.D.  Electronically Signed     MCY/MEDQ  D:  04/02/2007  T:  04/02/2007  Job:  [id number]

## 2010-11-23 NOTE — Cardiovascular Report (Signed)
[name], [name] NO.:  [id number]   MEDICAL RECORD NO.:  [id number]          PATIENT TYPE:  INP   LOCATION:  3703                         FACILITY:  [facility]   PHYSICIAN:  Sheliah Mends, MD      DATE OF BIRTH:  06-21-1961   DATE OF PROCEDURE:  02/05/2009  DATE OF DISCHARGE:                            CARDIAC CATHETERIZATION   INDICATION:  [name] is a 50 year old gentleman who presented  initially with an episode of syncope, hypertension, chest pain, and  shortness of breath.  He was brought by EMS to the emergency room and  ruled out for myocardial infarction as well as pulmonary embolism.  He  was discharged and has subsequent episodes of chest pain and near  syncope.  He was scheduled for cardiac stress test that was abnormal  with evidence of ischemia in the RCA territory.  An echocardiogram  showed normal left ventricular function.  No significant valve  abnormalities.  Given his comorbidities including morbid obesity, type 2  diabetes mellitus, hypertension, and dyslipidemia, a decision was made  to proceed with cardiac catheterization.   PROCEDURE:  1. Left heart catheterization.  2. Left ventriculography.  3. Selective coronary angiography.   After informed consent was obtained, the patient was brought to the  second floor cardiac catheterization lab at [facility] in the  postabsorptive state.  He was draped in sterile fashion.  He was started  on conscious sedation with Versed and fentanyl.  Local anesthesia to the  right groin was performed using lidocaine 1%.  A 5-French arterial  sheath was inserted into the right femoral artery using the modified  Seldinger technique.  A 5-French #4 right and left Judkins catheter was  used for selective coronary angiography.  A pigtail catheter was used  for left ventriculography.   FINDINGS:  1. Hemodynamics:  Aortic pressure 153/79 mmHg.  Left ventricular      pressure 153/15 mmHg.  There was  no significant aortic valve      gradient noted.   1. Left ventriculography:  Left ventriculography showed normal left      ventricular function and ejection fraction of 60%.  There was no      significant mitral regurgitation noted.   SELECTIVE CORONARY ANGIOGRAPHY:  Left main coronary artery:  The left  main coronary artery is a short, large-caliber vessel that trifurcates  into the left anterior descending artery, ramus intermedius, and left  circumflex artery.  The left main coronary artery is free of  angiographically significant disease.   Left anterior descending artery:  The left anterior descending artery is  a large vessel that gives off 1 large branching, branching diagonal  branch.  There was no angiographically significant disease in the LAD  territory seen.   Ramus intermedius:  The ramus intermedius is a large vessel without  angiographically significant disease.   Left circumflex artery:  The left circumflex artery is a large vessel  that gives off a large obtuse marginal branch.  There is no  angiographically significant disease seen.   Right coronary artery:  The  right coronary artery is a large and  dominant vessel.  There is no angiographically significant disease seen.   FINDINGS:  1. Normal coronary arteries without evidence of angiographically      significant disease.  2. Normal left ventricular size and function.  3. No significant valve disease noted.   RECOMMENDATIONS:  [name] will be admitted to [facility] for  24-hour observation given his history of morbid obesity and  comorbidities.  He will be monitored on telemetry.  He will proceed with  aggressive risk factor management of his diabetes, dyslipidemia, and  hypertension.  I will encourage [name] to undergo bariatric surgery  in the future.  Upon discharge, he will receive a 48-hour Holter monitor  to monitor for cardiac rhythm abnormalities.   The procedure was completed  without complications.      Sheliah Mends, MD  Electronically Signed     Sheliah Mends, MD  Electronically Signed    JE/MEDQ  D:  02/05/2009  T:  02/06/2009  Job:  [id number]

## 2010-11-23 NOTE — Op Note (Signed)
[name], [name] NO.:  [id number]   MEDICAL RECORD NO.:  [id number]          PATIENT TYPE:  INP   LOCATION:  3106                         FACILITY:  [facility]   PHYSICIAN:  Clydene Fake, M.D.  DATE OF BIRTH:  1960-09-26   DATE OF PROCEDURE:  08/04/2008  DATE OF DISCHARGE:                               OPERATIVE REPORT   PREOPERATIVE DIAGNOSES:  1. Lumbar stenosis.  2. Spondylosis.  3. Degenerative disk disease at L4-5 with prior fusion decompression      at L5-S1.   POSTOPERATIVE DIAGNOSES:  1. Lumbar stenosis.  2. Spondylosis.  3. Degenerative disk disease at L4-5 with prior fusion decompression      at L5-S1.   PROCEDURES:  Redo decompressing the L4 and L5 roots (two levels),  posterior lumbar interbody fusion at L4-5, Saber interbody cages at L4-  5, pedicle screw fixation segmented L4 through S1, posterolateral fusion  L4-5, autograft same incision, infused BNP.   SURGEON:  Clydene Fake, MD   ANESTHESIA:  General endotracheal tube anesthesia.   ESTIMATED BLOOD LOSS:  550 mL.   BLOOD GIVEN:  125 mL as Cell Saver return.   COMPLICATIONS:  None.   DRAINS:  None.   REASON FOR PROCEDURE:  The patient is a 50 year old gentleman who has  had L5-S1 fusion previously, has been having new back and leg pain,  trouble walking.  Epidural injections have helped, but not lasted.  An  MRI and x-rays show some __________ degeneration and some hypertrophy  degenerative disk disease and progressive stenosis at the L4-L5 level,  and the patient brought in for decompression fusion at the next level  higher from his previous fusion.   PROCEDURE IN DETAIL:  The patient was brought into the operating room  and general anesthesia was induced.  The patient was placed in a prone  position on Wilson frame with all pressure points padded.  The patient  was prepped and draped in sterile fashion.  Incision was injected with  20 mL of 1% lidocaine with epinephrine  20 mL of lidocaine with  epinephrine.  Incision was then made at the site of the previous surgery  in the lower lumbar spine and this incision was increased in size,  cephalad, incision was taken down the fascia.  Hemostasis was obtained  with Bovie cauterization.  The fascia was incised over the bottom most  spinous process lamina that we could feel and subperiosteal dissection  was done at the L4 spinous process lamina up to the facet.  We found the  pedicle screws.  We dissected the pedicle screws at L5 and S1.  We  dissected out laterally finding the transverse process of L4.  Markers  were placed in  the interspace and the pedicle of 4 and x-rays obtained  confirming our positioning.  The locking nuts were removed and the rods  removed from the screw construct, so we continued our dissection out  around the facets, so we then did a decompressive laminectomy with high-  speed drill, Kerrison punches, and Leksell rongeurs decompressing the  central  canal, decompressing the 4 and 5 roots bilaterally.  There was  extensive facet hypertrophy and extensive laminectomy was done here to  decompress the 4 and 5 roots more than what was needed for posterior  lumbar interbody fusion.  Disk space then explored.  Good hemostasis  with bipolar cauterization.  Disk space incised with a 15-blade and  diskectomy done with pituitary rongeurs and curette.  We distracted the  interspace up to 11 mm, continued to prepare the space for interbody  fusion.  Once we had all the disk removed, we packed two 11 high by 11  wide Saber interbody cages with infused BMP and autograft bone.  We  packed the interspace with autograft bone and tapped the cages into  place.  We then decorticated the lateral facets and transverse process  of L4 and L5 with high-speed drill.  We then placed pedicle screws at L4-  5 decorticating high-speed drill, placing pedicle probe down the pedicle  probing with a small probe making  sure we had good bony circumference  tapped with a hole and then placing Expedium  screws, we had Monarche  screws at the L5 and S1 areas.  They were left in place.  Rods were then  placed into the screw tops and appropriate locking nuts placed and these  were locked at L5 and S1 with some compression.  We then left down the  L4.  We checked the PLIF grafts and they were firmly in place.  AP and  lateral x-rays obtained showing good position of the screws, bone plugs.  We packed the rest of the infused BMP and the rest of the autograft  bone, posterolateral gutters and getting posterolateral fusion at L4-5.  Explored nerve roots dura.  We had good decompression.  We placed a  small piece of Gelfoam over the lateral gutter so no bone fragments  would get in and  compressing the nerve roots.  Retractors removed.  Fascia closed with 0-Vicryl interrupted sutures.  Subcutaneous tissue  closed with 0-2 and 3-0 Vicryl interrupted sutures.  Skin closed with  benzoin, Steri-Strips dressing was placed.  The patient was placed back  in a supine position, awoken from anesthesia, and transferred to  recovery room in stable condition.           ______________________________  Clydene Fake, M.D.     JRH/MEDQ  D:  08/05/2008  T:  08/05/2008  Job:  [id number]

## 2010-11-23 NOTE — Op Note (Signed)
[name], [name]                 ACCOUNT NO.:  [id number]   MEDICAL RECORD NO.:  [id number]          PATIENT TYPE:  AMB   LOCATION:  SDS                          FACILITY:  [facility]   PHYSICIAN:  Mark C. Ophelia Charter, M.D.    DATE OF BIRTH:  06/22/1961   DATE OF PROCEDURE:  12/22/2006  DATE OF DISCHARGE:  12/22/2006                               OPERATIVE REPORT   DICTATION NOTE:  This is a re-dictation.   PREOPERATIVE DIAGNOSIS:  Right shoulder labral tear, rotator cuff tear.   POSTOPERATIVE DIAGNOSIS:  Right shoulder labral tear, rotator cuff tear.   PROCEDURE PERFORMED:  Diagnostic operative arthroscopy, right shoulder;  examination under anesthesia; arthroscopic debridement of labral tear;  and open rotator cuff repair.   SURGEON:  Mark C. Ophelia Charter, M.D.   ANESTHESIA:  General.   DESCRIPTION OF PROCEDURE:  This 50 year old male underwent induction of  general anesthesia with some difficulty with visualization.  History of  difficult intubation in the past and also sleep apnea.  He was intubated  over a blue stylet with the establishment of a good airway.  Placed in  the beach-chair position, careful positioning, and a 10-15 drape  applied.  Standard prep with DuraPrep, appropriate stockinette, Coban  and split sheets draped, arthroscopic pouch.  Inflow was placed through  a posterior portal.  There was difficulty establishing the posterior  portal due to the patient's extreme obesity and great difficulty in even  palpating the spine or the scapula.   Once the posterior portal was established, three small stab incisions  had to be made.  The joint was insufflated with some local anesthesia  and a blunt trocar was used for entering the joint.  The anterior portal  was made with in-and-out technique inferior to the biceps tendon.  The  biceps tendon was intact.  There was some fraying of the posterior  labrum which was trimmed back, complex tear of the labrum.  The  undersurface of the  rotator cuff showed some tearing just lateral to the  biceps tendon involving the supraspinatus anterior edge.  There was some  egress of fluid out through this area and, after debridement of the  labrum, probing of the anterior labrum showed that it was intact.  There  was one small tiny little tear at 3 o'clock, but the anterior ligaments  were intact.  The tear did not extend distally and did not need to be  repaired.  The biceps anchor was intact.  The counter surface looked  good.  No Hill-Sachs lesion.   The subacromial space was then entered.  A blue probe was placed from my  anterolateral portal.  Probing showed the small tear and a small  incision was made, peeling deltoid off the acromion.  Acromioplasty was  performed with a three-quarters straight osteotome and subacromial  bursectomy was performed.  The rotator cuff was cut back, freshened up,  and an alternate anchor was placed, repairing the rotator cuff back down  to the freshened greater tuberosity.  Additional sutures were used to  oversew over the tops and smooth  the repair.  The subacromial space was  normal and the distal aspect of the clavicle was not prominent.   After irrigation and aspiration, the deltoid was threaded through holes  in the bone of the acromion, two on the subcutaneous tissue, and  subcuticular skin closure.  4-0 nylon was placed in the arthroscopic  portals, front and back, and then the patient transferred to the  recovery room after postoperative dressing and an extra-extra-large  sling application.  Instrument counts and needle counts were correct.      Mark C. Ophelia Charter, M.D.  Electronically Signed     MCY/MEDQ  D:  12/27/2006  T:  12/28/2006  Job:  [id number]

## 2010-11-23 NOTE — Discharge Summary (Signed)
NAMEHOLLIS, TULLER NO.:  [id number]   MEDICAL RECORD NO.:  [id number]          PATIENT TYPE:  INP   LOCATION:  3703                         FACILITY:  [facility]   PHYSICIAN:  Sheliah Mends, MD      DATE OF BIRTH:  10-Jul-1961   DATE OF ADMISSION:  02/05/2009  DATE OF DISCHARGE:  02/06/2009                               DISCHARGE SUMMARY   DISCHARGE DIAGNOSES:  1. Syncope.  2. Normal coronary arteries by cardiac catheterization.  3. Type 2 diabetes mellitus.  4. Hypertension.  5. Dyslipidemia.  6. Morbid obesity.  7. Obstructive sleep apnea, on continuous positive airway pressure.   DISCHARGE CONDITION:  Stable.   PROCEDURES:  Combined left heart cath by Dr. Sheliah Mends.   DISCHARGE MEDICATIONS:  1. Lantus 75 units subcu daily.  2. Glucophage, do not take until February 08, 2009, then continue at 1000      mg twice a day.  3. NovoLog 12 units twice a day with meals.  4. Altace 10 mg twice a day.  5. Actos 45 mg daily.  6. Stop Cardizem 120 mg daily.  7. Aspirin 81 mg daily.  8. Furosemide 20 mg daily.  9. KCl 20 mEq daily.  10.Vytorin 10/40 one daily.  11.Bystolic 10 mg 1 daily is being started during this      hospitalization.   DISCHARGE INSTRUCTIONS:  1. Increase activity slowly.  May shower and bathe.  No lifting for 2      days.  No driving for 2 days.  No sexual activities for 2 days.  2. Low-sodium, heart-healthy, diabetic diet.  3. Wash cath site with soap and water.  Call if any bleeding,      swelling, or drainage.  4. Follow up with Dr. Garen Lah on February 19, 2009, at 11:45 a.m.  5. Go to [facility] for Holter monitor today      after finishing with the hospital.   HOSPITAL COURSE:  The patient was seen as an outpatient by Dr. Garen Lah  due to syncope and some chest discomfort with his other issues as stated  in his problem list.  It was felt cardiac catheterization needed to be  done.  He underwent cardiac  catheterization on January 16, 2009, revealing  patent course.  He was stable.  He was kept overnight to monitor.  He  remained stable in sinus rhythm.  By the next morning, he ambulated  without problems and was ready for discharge.   LABORATORY WORK PRIOR TO DISCHARGE:  Hemoglobin 12.7, hematocrit 37.1,  platelets 239, WBC 5.5.  Sodium 136, potassium 4.3, BUN 17, creatinine  0.88, glucose 161.  LDL was 59.   The patient will proceed to [facility] for 48-hour  Holter monitor and follow up with Dr. Garen Lah as stated.  Please note  on his exam, the right groin cath site was stable, minimal dime-sized  ecchymosis, no hematoma.      Darcella Gasman. Annie Paras, N.P.      Sheliah Mends, MD  Electronically Signed    LRI/MEDQ  D:  02/06/2009  T:  02/06/2009  Job:  [id number]   cc:   Reita Chard

## 2010-11-23 NOTE — H&P (Signed)
[name], [name]                 ACCOUNT NO.:  [id number]   MEDICAL RECORD NO.:  [id number]          PATIENT TYPE:  INP   LOCATION:  5729                         FACILITY:  [facility]   PHYSICIAN:  Wayne A. Sheffield Slider, M.D.    DATE OF BIRTH:  19-Sep-1960   DATE OF ADMISSION:  02/14/2007  DATE OF DISCHARGE:                              HISTORY & PHYSICAL   PRIMARY CARE PHYSICIAN:  Dr. Nilda Simmer at [facility]   CHIEF COMPLAINT:  Knee pain.   HISTORY OF PRESENT ILLNESS:  [name] is a 50 year old obese male who  was brought by ambulance to the emergency department this morning  following a fall at work at approximately 10:30 this morning.  He was  walking down a slope when he stepped into a hole with his left foot  causing him to fall.  He felt a pop above both knees and fell to the  ground on his hands and knees.  He could not stand up due to the pain.  He was, however, able to crawl on his knees back up the slope where he  was able to get help.   REVIEW OF SYSTEMS:  He denies back pain, hip pain or ankle pain.  He  denies any bowel or bladder incontinence or any new numbness or  tingling.  Of note, he does have residual numbness from a ruptured  disc/laminectomy over his third through fifth toes on his left foot and  the sole of his left foot.   PAST MEDICAL HISTORY:  1. Type 2 diabetes, since 1993.  2. Hyperlipidemia.  3. Hypertension.  4. Morbid obesity.  5. Right rotator cuff surgery in June of 2008.  6. Left back surgery x2, the first in 1998, the second in 2005.   MEDICATIONS:  1. Lantus 80 units subcu q.h.s.  2. Glucophage 850 mg tablets 2 tabs in the morning, 1 at night.  3. Glimepiride 4 mg in the morning.  4. Vytorin 10/40 q.h.s.  5. Altace 10 mg daily.  6. Actos 45 mg daily.  7. Reglan 10 mg q.a.c. and q.h.s.   FAMILY HISTORY:  Noncontributory.   SOCIAL HISTORY:  The patient is married and has 3 children.  He works  for the department of  transportation and he denies any smoking or  alcohol.   PHYSICAL EXAMINATION:  VITAL SIGNS:  Temperature 97.8.  Pulse 95.  Respirations 20.  Blood pressure 144/85.  Saturating 100% on room air.  GENERALLY:  He is alert and oriented , obese, in no acute distress.  He  is pleasant and cooperative to exam.  HEENT:  He is normocephalic, atraumatic.  Extraocular movements are  intact.  Mucous membranes are moist.  NECK:  Has full range of motion and no  lymphadenopathy.  CV:  Regular rate and rhythm without murmurs, rubs or gallops.  Strong  2+ radial and PL pulses.  LUNGS:  Clear to auscultation bilaterally.  No wheezes or crackles.  ABDOMEN:  Soft, obese, nontender, nondistended and positive bowel  sounds.  EXTREMITIES:  There is a small palpable and  visible bulge with subtle  overlying swelling just proximal to the left knee at rest.  With  contraction, there is a palpable depression thigh and medial thigh just  proximal to the knee.  This corresponds to the site of his pain.  There  is residual numbness over this left third and fifth toes and sole from a  previous back injury.  He has difficulty raising his legs bilaterally  secondary to the pain of flexing his left knee secondary to the pain.   LABORATORY DATA:  CBC was normal, except for a white blood cell count of  12.1.  His CK was elevated at 981 and BMET within normal limits, except  for a glucose of 207.  Of note, her creatinine is 1.00 on admission.  Knee films taken in the ER were negative.   ASSESSMENT/PLAN:  This is a 50 year old with an acute left lower  extremity injury suspicious for a possible partial quadriceps tear.   1. Left leg pain.  The patient is unable to walk secondary to pain and      body habitus.  It is suspicious for quadriceps tear.  We will admit      him for pain control and we will consult orthopedics in the      morning.  This would be a workers comp issue and his company uses      [employer], Dr. Ophelia Charter.  We will give him Vicodin 5 mg 1-      2 tabs q.4-6 hours p.r.n. pain.  2. He does have an elevated CK secondary to muscle injury.  We will      run IV fluids, normal saline at 125 mg an hour to help flush the      myoglobin through his kidneys and out the urine.  We will check a      BMET in the a.m. to assess his renal function.  We will recheck a      CK in the a.m. as well.  3. Diabetes mellitus.  His hemoglobin A1c is unknown.  Glucose is      elevated on admission.  We will check a hemoglobin A1c and put him      on his home dose of Lantus and Actos and cover him with moderate      resistant sliding scale insulin.  We will hold his metformin if his      kidney function temporary deteriorates while he clears the      increased CK.  4. Hypertension.  We will continue the Altace for now, but monitor his      renal function closely.  If his creatinine worsens we will hold the      Altace until his renal function improves and cover him with an      alternate agent if needed.  5. Hyperlipidemia.  We will check a fasting lipid panel here and      continue his home Vytorin dose.  6. Disposition.  We will admit him for 23 hour observation for pain      control and an orthopedic consult and to monitor his renal function      while he clears his creatinine kinase.      [name] Garland, MD  Electronically Signed      [name], M.D.  Electronically Signed    LM/MEDQ  D:  02/14/2007  T:  02/15/2007  Job:  [id number]

## 2011-01-07 ENCOUNTER — Encounter (INDEPENDENT_AMBULATORY_CARE_PROVIDER_SITE_OTHER): Payer: BC Managed Care – PPO

## 2011-01-27 ENCOUNTER — Encounter (INDEPENDENT_AMBULATORY_CARE_PROVIDER_SITE_OTHER): Payer: BC Managed Care – PPO | Admitting: Surgery

## 2011-02-23 ENCOUNTER — Encounter (INDEPENDENT_AMBULATORY_CARE_PROVIDER_SITE_OTHER): Payer: Self-pay | Admitting: General Surgery

## 2011-02-25 ENCOUNTER — Ambulatory Visit (INDEPENDENT_AMBULATORY_CARE_PROVIDER_SITE_OTHER): Payer: BC Managed Care – PPO | Admitting: Surgery

## 2011-02-25 DIAGNOSIS — Z4651 Encounter for fitting and adjustment of gastric lap band: Secondary | ICD-10-CM

## 2011-02-25 DIAGNOSIS — Z9884 Bariatric surgery status: Secondary | ICD-10-CM

## 2011-02-25 NOTE — Progress Notes (Signed)
[name] returns today in followup. He is 1.4 years out from his lap band and has lost 48 pounds. Today I added 0.3 cc to his band.  He did have [facility] Spotted Fever last month.  Will see him back in December.

## 2011-04-21 LAB — URINALYSIS, MICROSCOPIC ONLY
Ketones, ur: NEGATIVE
Leukocytes, UA: NEGATIVE
Nitrite: NEGATIVE
pH: 5.5

## 2011-04-21 LAB — CBC
HCT: 39
MCV: 87.2
Platelets: 449 — ABNORMAL HIGH
RDW: 13.6
WBC: 8.8

## 2011-04-21 LAB — COMPREHENSIVE METABOLIC PANEL
AST: 17
Albumin: 3.6
BUN: 17
Calcium: 9.5
Creatinine, Ser: 0.85
GFR calc Af Amer: >60
Total Bilirubin: 0.8
Total Protein: 8

## 2011-04-21 LAB — HEMOGLOBIN A1C: Mean Plasma Glucose: 218

## 2011-04-21 LAB — DIFFERENTIAL
Basophils Absolute: 0
Eosinophils Relative: 2
Lymphocytes Relative: 27
Lymphs Abs: 2.4
Monocytes Absolute: 0.6
Monocytes Relative: 7
Neutro Abs: 5.6

## 2011-04-21 LAB — APTT: aPTT: 32

## 2011-04-25 LAB — CBC
HCT: 34.7 — ABNORMAL LOW
HCT: 35.2 — ABNORMAL LOW
HCT: 40.8
Hemoglobin: 12.2 — ABNORMAL LOW
Hemoglobin: 14
MCHC: 34.2
MCHC: 34.5
MCV: 85.3
MCV: 85.9
MCV: 86.2
Platelets: 357
Platelets: 379
RBC: 3.95 — ABNORMAL LOW
RBC: 4.09 — ABNORMAL LOW
RBC: 4.73
RDW: 13.9
WBC: 12.1 — ABNORMAL HIGH
WBC: 6.2
WBC: 6.8
WBC: 6.8

## 2011-04-25 LAB — BASIC METABOLIC PANEL
BUN: 18
BUN: 25 — ABNORMAL HIGH
BUN: 26 — ABNORMAL HIGH
CO2: 27
CO2: 30
Calcium: 8.8
Chloride: 103
Chloride: 98
Creatinine, Ser: 0.7
Creatinine, Ser: 0.76
Creatinine, Ser: 0.93
GFR calc Af Amer: >60
GFR calc Af Amer: >60
GFR calc Af Amer: >60
GFR calc non Af Amer: >60
GFR calc non Af Amer: >60
Glucose, Bld: 146 — ABNORMAL HIGH
Potassium: 4.1
Sodium: 141

## 2011-04-25 LAB — LIPID PANEL
LDL Cholesterol: 132 — ABNORMAL HIGH
Total CHOL/HDL Ratio: 5.3
Triglycerides: 122
VLDL: 24

## 2011-04-25 LAB — DIFFERENTIAL
Basophils Absolute: 0.1
Basophils Relative: 1
Eosinophils Absolute: 0.1
Eosinophils Relative: 1
Lymphocytes Relative: 25
Lymphs Abs: 3
Monocytes Absolute: 1 — ABNORMAL HIGH
Monocytes Relative: 9
Neutro Abs: 7.9 — ABNORMAL HIGH
Neutrophils Relative %: 65

## 2011-04-25 LAB — HEMOGLOBIN A1C: Hgb A1c MFr Bld: 10.8 — ABNORMAL HIGH

## 2011-04-25 LAB — CK: Total CK: 981 — ABNORMAL HIGH

## 2011-04-25 LAB — BASIC METABOLIC PANEL WITH GFR
Calcium: 8.8
Creatinine, Ser: 1
GFR calc non Af Amer: >60
Glucose, Bld: 277 — ABNORMAL HIGH
Sodium: 133 — ABNORMAL LOW

## 2011-04-28 LAB — COMPREHENSIVE METABOLIC PANEL
Albumin: 3.3 — ABNORMAL LOW
Alkaline Phosphatase: 62
BUN: 13
Creatinine, Ser: 0.96
GFR calc Af Amer: >60
Potassium: 4.5
Total Protein: 6.7

## 2011-04-28 LAB — DIFFERENTIAL
Lymphocytes Relative: 30
Monocytes Absolute: 0.4
Monocytes Relative: 5
Neutro Abs: 5.4

## 2011-04-28 LAB — CBC
HCT: 40.6
Platelets: 324
RDW: 13.6

## 2011-04-28 LAB — APTT: aPTT: 28

## 2011-07-22 ENCOUNTER — Encounter (INDEPENDENT_AMBULATORY_CARE_PROVIDER_SITE_OTHER): Payer: Self-pay | Admitting: Surgery

## 2011-07-22 ENCOUNTER — Ambulatory Visit (INDEPENDENT_AMBULATORY_CARE_PROVIDER_SITE_OTHER): Payer: BC Managed Care – PPO | Admitting: Surgery

## 2011-07-22 VITALS — BP 132/80 | HR 80 | Temp 98.5°F | Resp 18 | Ht 72.0 in | Wt 335.2 lb

## 2011-07-22 DIAGNOSIS — Z9884 Bariatric surgery status: Secondary | ICD-10-CM

## 2011-07-22 DIAGNOSIS — Z4651 Encounter for fitting and adjustment of gastric lap band: Secondary | ICD-10-CM

## 2011-07-28 DIAGNOSIS — Z9884 Bariatric surgery status: Secondary | ICD-10-CM | POA: Insufficient documentation

## 2011-07-28 NOTE — Progress Notes (Signed)
[name] Body mass index is 45.46 kg/(m^2).  Having regurgitation:  no  Nocturnal reflux?  no  Amount of fill  .3 cc

## 2011-09-10 ENCOUNTER — Ambulatory Visit (INDEPENDENT_AMBULATORY_CARE_PROVIDER_SITE_OTHER): Payer: BC Managed Care – PPO | Admitting: Family Medicine

## 2011-09-10 VITALS — BP 128/75 | HR 97 | Temp 98.0°F | Resp 16 | Ht 73.5 in | Wt 331.0 lb

## 2011-09-10 DIAGNOSIS — R110 Nausea: Secondary | ICD-10-CM

## 2011-09-10 DIAGNOSIS — E109 Type 1 diabetes mellitus without complications: Secondary | ICD-10-CM

## 2011-09-10 DIAGNOSIS — N200 Calculus of kidney: Secondary | ICD-10-CM

## 2011-09-10 LAB — POCT URINALYSIS DIPSTICK
Bilirubin, UA: NEGATIVE
Glucose, UA: NEGATIVE
Spec Grav, UA: 1.01

## 2011-09-10 LAB — POCT UA - MICROSCOPIC ONLY
Bacteria, U Microscopic: NEGATIVE
Mucus, UA: NEGATIVE
WBC, Ur, HPF, POC: NEGATIVE

## 2011-09-10 MED ORDER — ONDANSETRON 8 MG PO TBDP: 8.0000 mg | ORAL_TABLET | Freq: Three times a day (TID) | ORAL | Status: AC | PRN

## 2011-09-10 MED ORDER — HYDROCODONE-ACETAMINOPHEN 5-500 MG PO CAPS: 1.0000 | ORAL_CAPSULE | Freq: Four times a day (QID) | ORAL | Status: AC | PRN

## 2011-09-10 NOTE — Progress Notes (Signed)
  Subjective:    Patient ID: [name], male    DOB: [birth year], 50 y.o.   MRN: [id number]  Flank Pain This is a recurrent problem. The current episode started yesterday. The problem occurs constantly. The problem has been gradually improving since onset. Pertinent negatives include no fever.   Pain initially in the (L) flank then moved to patient's (L) anterior abdomen and pelvis. Pain very severe at 6 PM then eased off 7:15 PM  Patient describes urinary hesitancy and pressure then symptoms resolved  History of nephrolithiasis year's ago and this episode feels identical  Nausea without emesis with pain Review of Systems  Constitutional: Negative for fever and chills.  Genitourinary: Positive for flank pain.   Recent flair of lower back symptoms S/P lumbar fusion L4-L5, 2010, 3 procedures 1993,2006,  Type 2 DM fasting BS's all less than 100    Objective:   Physical Exam  Constitutional: He appears well-developed and well-nourished.  HENT:  Head: Normocephalic and atraumatic.  Neck: Neck supple.  Cardiovascular: Normal rate, regular rhythm and normal heart sounds.   Pulmonary/Chest: Effort normal and breath sounds normal.  Abdominal: Soft.  Neurological: He is alert.  Skin: Skin is warm.       Appendectomy scar      Results for orders placed in visit on 09/10/11  POCT UA - MICROSCOPIC ONLY      Component Value Range   WBC, Ur, HPF, POC neg     RBC, urine, microscopic 6-7     Bacteria, U Microscopic neg     Mucus, UA neg     Epithelial cells, urine per micros neg     Crystals, Ur, HPF, POC neg     Casts, Ur, LPF, POC neg     Yeast, UA neg    POCT URINALYSIS DIPSTICK      Component Value Range   Color, UA yeloow     Clarity, UA hazy     Glucose, UA neg     Bilirubin, UA neg     Ketones, UA trace     Spec Grav, UA 1.010     Blood, UA large     pH, UA 7.0     Protein, UA neg     Urobilinogen, UA 0.2     Nitrite, UA neg     Leukocytes, UA Negative        Assessment & Plan:   1. Nephrolithiasis  POCT UA - Microscopic Only, POCT urinalysis dipstick, ondansetron (ZOFRAN ODT) 8 MG disintegrating tablet, hydrocodone-acetaminophen (LORCET-HD) 5-500 MG per capsule  2. Nausea      Urine strainer with urine cup provided should patient pass a stone. Will then send for analysis.  Patient in full agreement with plan of care.

## 2011-09-23 ENCOUNTER — Encounter (INDEPENDENT_AMBULATORY_CARE_PROVIDER_SITE_OTHER): Payer: BC Managed Care – PPO | Admitting: Surgery

## 2011-11-24 ENCOUNTER — Encounter (INDEPENDENT_AMBULATORY_CARE_PROVIDER_SITE_OTHER): Payer: BC Managed Care – PPO | Admitting: Surgery

## 2012-01-19 ENCOUNTER — Ambulatory Visit (INDEPENDENT_AMBULATORY_CARE_PROVIDER_SITE_OTHER): Payer: BC Managed Care – PPO | Admitting: Surgery

## 2012-01-19 ENCOUNTER — Encounter (INDEPENDENT_AMBULATORY_CARE_PROVIDER_SITE_OTHER): Payer: Self-pay | Admitting: Surgery

## 2012-01-19 VITALS — BP 132/75 | HR 69 | Temp 97.7°F | Resp 18 | Ht 72.0 in | Wt 332.0 lb

## 2012-01-19 DIAGNOSIS — Z9884 Bariatric surgery status: Secondary | ICD-10-CM

## 2012-01-19 NOTE — Progress Notes (Signed)
[name] Body mass index is 45.03 kg/(m^2).  Having regurgitation:  no  Nocturnal reflux?  no  Amount of fill  0.3 Weight at 332.  Needs some more restriction Return 3 months

## 2012-01-19 NOTE — Patient Instructions (Addendum)

## 2012-04-23 ENCOUNTER — Ambulatory Visit: Payer: BC Managed Care – PPO | Admitting: Family Medicine

## 2012-05-14 ENCOUNTER — Ambulatory Visit: Payer: BC Managed Care – PPO | Admitting: Family Medicine

## 2012-05-28 ENCOUNTER — Encounter: Payer: Self-pay | Admitting: *Deleted

## 2012-05-31 ENCOUNTER — Encounter (INDEPENDENT_AMBULATORY_CARE_PROVIDER_SITE_OTHER): Payer: BC Managed Care – PPO | Admitting: Surgery

## 2012-06-05 ENCOUNTER — Ambulatory Visit (INDEPENDENT_AMBULATORY_CARE_PROVIDER_SITE_OTHER): Payer: BC Managed Care – PPO | Admitting: Family Medicine

## 2012-06-05 ENCOUNTER — Encounter: Payer: Self-pay | Admitting: Family Medicine

## 2012-06-05 VITALS — BP 153/81 | HR 88 | Temp 98.0°F | Resp 18 | Ht 72.0 in | Wt 315.2 lb

## 2012-06-05 DIAGNOSIS — E669 Obesity, unspecified: Secondary | ICD-10-CM

## 2012-06-05 DIAGNOSIS — I10 Essential (primary) hypertension: Secondary | ICD-10-CM

## 2012-06-05 DIAGNOSIS — E1159 Type 2 diabetes mellitus with other circulatory complications: Secondary | ICD-10-CM | POA: Insufficient documentation

## 2012-06-05 DIAGNOSIS — Z9884 Bariatric surgery status: Secondary | ICD-10-CM

## 2012-06-05 DIAGNOSIS — Z23 Encounter for immunization: Secondary | ICD-10-CM

## 2012-06-05 DIAGNOSIS — E1165 Type 2 diabetes mellitus with hyperglycemia: Secondary | ICD-10-CM | POA: Insufficient documentation

## 2012-06-05 DIAGNOSIS — E119 Type 2 diabetes mellitus without complications: Secondary | ICD-10-CM

## 2012-06-05 DIAGNOSIS — E7800 Pure hypercholesterolemia, unspecified: Secondary | ICD-10-CM

## 2012-06-05 LAB — COMPREHENSIVE METABOLIC PANEL
ALT: 11 U/L (ref 0–53)
AST: 11 U/L (ref 0–37)
Albumin: 4.3 g/dL (ref 3.5–5.2)
BUN: 12 mg/dL (ref 6–23)
Calcium: 9.5 mg/dL (ref 8.4–10.5)
Chloride: 100 mEq/L (ref 96–112)
Potassium: 4.6 mEq/L (ref 3.5–5.3)
Sodium: 139 mEq/L (ref 135–145)
Total Protein: 7.8 g/dL (ref 6.0–8.3)

## 2012-06-05 LAB — CBC WITH DIFFERENTIAL/PLATELET
Eosinophils Relative: 2 % (ref 0–5)
HCT: 42.7 % (ref 39.0–52.0)
Lymphocytes Relative: 37 % (ref 12–46)
Lymphs Abs: 2.7 10*3/uL (ref 0.7–4.0)
MCV: 85.9 fL (ref 78.0–100.0)
Monocytes Absolute: 0.5 10*3/uL (ref 0.1–1.0)
Platelets: 323 10*3/uL (ref 150–400)
RBC: 4.97 MIL/uL (ref 4.22–5.81)
WBC: 7.2 10*3/uL (ref 4.0–10.5)

## 2012-06-05 LAB — LIPID PANEL
Cholesterol: 146 mg/dL (ref 0–200)
LDL Cholesterol: 85 mg/dL (ref 0–99)
VLDL: 20 mg/dL (ref 0–40)

## 2012-06-05 NOTE — Progress Notes (Signed)
[address]   [place], [place]  [address]   [phone]  Subjective:    Patient ID: [name], male    DOB: 23-May-1961, 51 y.o.   MRN: [id number]  HPIThis 51 y.o. male presents for four month follow-up:  1.  DMII: last visit four months ago; Lantus increased to 80 units in 01/2012; last HgbA1c in 01/2012 of 10.0.  Sugars running good; fasting sugars running 90s; highest in evening 246 before supper.  Did not schedule eye exam.  Reports good compliance with medication, good tolerance to medication; good symptom control.  2.  HTN:  Four month follow-up;  ran out of medication yesterday; has not taken medication this morning.  Does not check blood pressure at home.  Reports good compliance with medication, good tolerance to medication, good symptom control.  Denies chest pain, palpitations, shortness of breath, leg swelling.  3.  Hyperlipidemia:  Fasting this morning; four month follow-up; cholesterol uncontrolled at last visit.  Reports good tolerance to medication, good symptom control.  Denies HA, dizziness, focal weakness, paresthesias.      4.  Flu vaccine: agreeable.   5. Obesity: weight down 17 pounds from 01/2012; appointment in upcoming two weeks with general surgery. Good satiety.    Review of Systems  Constitutional: Positive for chills. Negative for fever, diaphoresis, activity change, appetite change and fatigue.  Respiratory: Negative for cough and shortness of breath.   Cardiovascular: Negative for chest pain, palpitations and leg swelling.  Neurological: Negative for dizziness, tremors, syncope, facial asymmetry, speech difficulty, weakness, light-headedness, numbness and headaches.        Past Medical History  Diagnosis Date  . Diabetes mellitus   . Hyperlipidemia   . Hypertension   . Impotence of organic origin   . Unspecified disorder of esophagus   . Anemia, unspecified   . Obesity, unspecified   . Allergic rhinitis, cause unspecified   . Other chronic  allergic conjunctivitis   . Obstructive sleep apnea (adult) (pediatric)   . Degeneration of intervertebral disc, site unspecified   . Personal history of colonic polyps   . Pain in limb     Past Surgical History  Procedure Date  . Laparoscopic gastric banding 09/15/09  . Back surgery     x3  . Quadriceps tendon repair     left  . Quadriceps repair     left  . Rotator cuff repair     right  . Appendectomy   . Tonsillectomy and adenoidectomy 1960  . Arthroscopy of knee 1980    Prior to Admission medications   Medication Sig Start Date End Date Taking? Authorizing Provider  aspirin 81 MG tablet Take 81 mg by mouth daily.   Yes Historical Provider, MD  CALCIUM PO Take 1,500 mg by mouth daily.   Yes Historical Provider, MD  cyclobenzaprine (FLEXERIL) 5 MG tablet Ad lib. 07/13/11  Yes Historical Provider, MD  glimepiride (AMARYL) 2 MG tablet Take 2 mg by mouth 2 (two) times daily with a meal.   Yes Historical Provider, MD  INS SYRINGE/NEEDLE 1CC/28G (B-D INS SYR MICROFINE 1CC/28G) 28G X 1/2" 1 ML MISC by Does not apply route.   Yes Historical Provider, MD  insulin aspart (NOVOLOG FLEXPEN) 100 UNIT/ML injection Inject 5 Units into the skin daily with supper.   Yes Historical Provider, MD  LANTUS 100 UNIT/ML injection 65 Units daily. 07/13/11  Yes Historical Provider, MD  metFORMIN (GLUCOPHAGE) 1000 MG tablet BID times 48H. 05/31/11  Yes Historical  Provider, MD  Multiple Vitamin (MULTIVITAMIN) capsule Take 1 capsule by mouth daily.   Yes Historical Provider, MD  ramipril (ALTACE) 10 MG capsule daily. 06/28/11  Yes Historical Provider, MD  sildenafil (VIAGRA) 100 MG tablet Take 100 mg by mouth daily as needed.   Yes Historical Provider, MD  simvastatin (ZOCOR) 40 MG tablet Take 40 mg by mouth daily.   Yes Historical Provider, MD    Allergies  Allergen Reactions  . Voltaren (Diclofenac Sodium) Anaphylaxis  . Diclofenac Sodium Itching    SOB, Hypotension     History   Social History    . Marital Status: Married    Spouse Name: N/A    Number of Children: 3  . Years of Education: N/A   Occupational History  . [employer]     Supervisor x 23 yrs   Social History Main Topics  . Smoking status: Former Smoker    Types: Cigarettes  . Smokeless tobacco: Never Used     Comment: quit 30 years ago  . Alcohol Use: Yes     Comment: occasional Beer on ce every two weeks on the weekends  . Drug Use: No  . Sexually Active: Not on file   Other Topics Concern  . Not on file   Social History Narrative   Always uses seat belts. Smoke alarm in the home. Guns in the home stored in locked cabinet. Exercise: Light. 3 x week, 20 - 30 minutes, walking. Married x 26 years, happy. Pets: Dog.    Family History  Problem Relation Age of Onset  . Cancer Father     skin  . Heart disease Father   . Diabetes Father   . Hypertension Father   . Hyperlipidemia Father   . Cancer Maternal Uncle     lymphoma  . Cancer Paternal Grandfather     skin  . Diabetes Mother   . Depression Sister   . Diabetes Sister   . Peripheral Artery Disease Father   . Parkinson's disease Father     Objective:   Physical Exam  Nursing note and vitals reviewed. Constitutional: He is oriented to person, place, and time. He appears well-developed and well-nourished. No distress.  Eyes: Conjunctivae normal are normal. Pupils are equal, round, and reactive to light.  Neck: Normal range of motion. Neck supple. No JVD present. No thyromegaly present.  Cardiovascular: Normal rate, regular rhythm, normal heart sounds and intact distal pulses.  Exam reveals no gallop and no friction rub.   No murmur heard. Pulmonary/Chest: Effort normal and breath sounds normal. He has no wheezes. He has no rales.  Musculoskeletal: He exhibits no edema.  Lymphadenopathy:    He has no cervical adenopathy.  Neurological: He is alert and oriented to person, place, and time. No cranial nerve deficit. He exhibits normal  muscle tone. Coordination normal.  Skin: He is not diaphoretic.       Thickened hypertrophied skin diffusely B feet; callus formation R 1st toe.  No foot ulcerations.  Psychiatric: He has a normal mood and affect. His behavior is normal. Judgment and thought content normal.      INFLUENZA VACCINE ADMINISTERED.  Assessment & Plan:   1. Flu vaccine need  Flu vaccine greater than or equal to 3yo preservative free IM  2. Type II or unspecified type diabetes mellitus without mention of complication, not stated as uncontrolled  Comprehensive metabolic panel, Hemoglobin A1c  3. Essential hypertension, benign  CBC with Differential, CK  4. Pure  hypercholesterolemia  Lipid panel

## 2012-06-05 NOTE — Assessment & Plan Note (Signed)
Uncontrolled; tolerating higher dose of Lantus.  Obtain labs.  Encouraged again to schedule ophthalmology follow-up.

## 2012-06-05 NOTE — Assessment & Plan Note (Signed)
Uncontrolled but non-compliance with medication.  If remains elevated at next visit, will warrant additional medication.  Obtain labs.

## 2012-06-05 NOTE — Patient Instructions (Addendum)
1. Flu vaccine need  Flu vaccine greater than or equal to 53yo preservative free IM  2. Type II or unspecified type diabetes mellitus without mention of complication, not stated as uncontrolled    3. Essential hypertension, benign    4. Pure hypercholesterolemia

## 2012-06-05 NOTE — Assessment & Plan Note (Signed)
Improving; weight down in past four months by 15-17 pounds; follow-up with general surgery in upcoming two weeks.

## 2012-06-05 NOTE — Assessment & Plan Note (Signed)
Administered in office.

## 2012-06-05 NOTE — Assessment & Plan Note (Signed)
Slowly improving; followed every 6-8 weeks by general surgery for lap band adjustments.

## 2012-06-05 NOTE — Assessment & Plan Note (Signed)
Uncontrolled; obtain labs; s/p 15 pound weight loss in past four months; no change in medications at this time.

## 2012-06-10 MED ORDER — GLIMEPIRIDE 4 MG PO TABS: 4.0000 mg | ORAL_TABLET | Freq: Two times a day (BID) | ORAL | Status: DC

## 2012-06-10 NOTE — Addendum Note (Signed)
Addended by: Johnnette Litter on: 06/10/2012 03:17 PM   Modules accepted: Orders

## 2012-06-29 ENCOUNTER — Encounter (INDEPENDENT_AMBULATORY_CARE_PROVIDER_SITE_OTHER): Payer: BC Managed Care – PPO | Admitting: Surgery

## 2012-08-16 ENCOUNTER — Other Ambulatory Visit: Payer: Self-pay | Admitting: *Deleted

## 2012-08-16 MED ORDER — INSULIN GLARGINE 100 UNIT/ML ~~LOC~~ SOLN: 65.0000 [IU] | Freq: Every day | SUBCUTANEOUS | Status: DC

## 2012-08-17 ENCOUNTER — Ambulatory Visit (INDEPENDENT_AMBULATORY_CARE_PROVIDER_SITE_OTHER): Payer: BC Managed Care – PPO | Admitting: Family Medicine

## 2012-08-17 VITALS — BP 125/84 | HR 93 | Temp 97.7°F | Resp 16 | Ht 73.0 in | Wt 323.0 lb

## 2012-08-17 DIAGNOSIS — J019 Acute sinusitis, unspecified: Secondary | ICD-10-CM

## 2012-08-17 DIAGNOSIS — J309 Allergic rhinitis, unspecified: Secondary | ICD-10-CM

## 2012-08-17 MED ORDER — FLUTICASONE PROPIONATE 50 MCG/ACT NA SUSP: 2.0000 | Freq: Every day | NASAL | Status: DC

## 2012-08-17 MED ORDER — AMOXICILLIN-POT CLAVULANATE 875-125 MG PO TABS: 1.0000 | ORAL_TABLET | Freq: Two times a day (BID) | ORAL | Status: DC

## 2012-08-17 NOTE — Progress Notes (Signed)
[facility]:  Office Visit  Chief Complaint:  Chief Complaint  Patient presents with  . Sinusitis    x 2 week    HPI: [name] is a 52 y.o. male who complains of  2 week history of cold sxs , HA and sinus congestion, occasional draiange, rhinorrhea. He has sinus pain and pressure behinds eyes/nose. Ears are fine. Denies fevers, chills, msk aches, n/v, abd pain. Has taken otc sinus meds with minimal releif. Gets better then gets worse.  Has h/o allergies, no asthma. Has been able to use his CPAP "most of the time".   Past Medical History  Diagnosis Date  . Diabetes mellitus   . Hyperlipidemia   . Hypertension   . Impotence of organic origin   . Unspecified disorder of esophagus   . Anemia, unspecified   . Obesity, unspecified   . Allergic rhinitis, cause unspecified   . Other chronic allergic conjunctivitis   . Obstructive sleep apnea (adult) (pediatric)   . Degeneration of intervertebral disc, site unspecified   . Personal history of colonic polyps   . Pain in limb    Past Surgical History  Procedure Date  . Laparoscopic gastric banding 09/15/09  . Back surgery     x3  . Quadriceps tendon repair     left  . Quadriceps repair     left  . Rotator cuff repair     right  . Appendectomy   . Tonsillectomy and adenoidectomy 1960  . Arthroscopy of knee 1980  . Colonoscopy 07/11/2009    single polyp; repeat in 5 years.  [name].  . Allergy consult 07/11/2010    SOB, itching, facial tingling, hypotension.  Allergy testing: no food allergies; +reaction to tree pollen, grass pollen, dust mites, mold.  Avoid diclofenac and similar products.  [name].  [name] consult 07/11/2010    CT chest in ED (gastric thickening).  [name].  No EGD warranted due to lack of symptoms.  . Sleep study 07/11/2010    repeat sleep study due to weight loss.  Severe OSA; CPAP titration to 8 cm.    . Admission 01/2009    [facility].  Syncopal event with nausea, diaphoresis, hypertension.   D-dimer elevated at 2.54 with negative CT chest. and VQ scan.  Cardiac enzymes negative x 3.  Labs normal.  Cardiology consult---cardiolite abn; cath, 2D-echo, holter monitor unrevealing.  SE H&V.   History   Social History  . Marital Status: Married    Spouse Name: N/A    Number of Children: 3  . Years of Education: N/A   Occupational History  . [employer]     Supervisor x 23 yrs   Social History Main Topics  . Smoking status: Former Smoker    Types: Cigarettes  . Smokeless tobacco: Never Used     Comment: quit 30 years ago  . Alcohol Use: Yes     Comment: occasional Beer on ce every two weeks on the weekends  . Drug Use: No  . Sexually Active: None   Other Topics Concern  . None   Social History Narrative   Always uses seat belts. Smoke alarm in the home. Guns in the home stored in locked cabinet. Exercise: Light. 3 x week, 20 - 30 minutes, walking. Married x 26 years, happy. Pets: Dog.   Family History  Problem Relation Age of Onset  . Cancer Father     skin  . Heart disease Father   .  Diabetes Father   . Hypertension Father   . Hyperlipidemia Father   . Cancer Maternal Uncle     lymphoma  . Cancer Paternal Grandfather     skin  . Diabetes Mother   . Depression Sister   . Diabetes Sister   . Peripheral Artery Disease Father   . Parkinson's disease Father    Allergies  Allergen Reactions  . Voltaren (Diclofenac Sodium) Anaphylaxis  . Diclofenac Sodium Itching    SOB, Hypotension    Prior to Admission medications   Medication Sig Start Date End Date Taking? Authorizing Provider  aspirin 81 MG tablet Take 81 mg by mouth daily.   Yes Historical Provider, MD  CALCIUM PO Take 1,500 mg by mouth daily.   Yes Historical Provider, MD  cyclobenzaprine (FLEXERIL) 5 MG tablet Ad lib. 07/13/11  Yes Historical Provider, MD  glimepiride (AMARYL) 4 MG tablet Take 1 tablet (4 mg total) by mouth 2 (two) times daily. 06/10/12  Yes [name], MD  INS  SYRINGE/NEEDLE 1CC/28G (B-D INS SYR MICROFINE 1CC/28G) 28G X 1/2" 1 ML MISC by Does not apply route.   Yes Historical Provider, MD  insulin aspart (NOVOLOG FLEXPEN) 100 UNIT/ML injection Inject 5 Units into the skin daily with supper.   Yes Historical Provider, MD  insulin glargine (LANTUS) 100 UNIT/ML injection Inject 65 Units into the skin daily. 08/16/12  Yes Godfrey Pick, PA-C  metFORMIN (GLUCOPHAGE) 1000 MG tablet BID times 48H. 05/31/11  Yes Historical Provider, MD  Multiple Vitamin (MULTIVITAMIN) capsule Take 1 capsule by mouth daily.   Yes Historical Provider, MD  ramipril (ALTACE) 10 MG capsule daily. 06/28/11  Yes Historical Provider, MD  sildenafil (VIAGRA) 100 MG tablet Take 100 mg by mouth daily as needed.   Yes Historical Provider, MD  simvastatin (ZOCOR) 40 MG tablet Take 40 mg by mouth daily.   Yes Historical Provider, MD     ROS: The patient denies  night sweats, unintentional weight loss, chest pain, palpitations, wheezing, dyspnea on exertion, nausea, vomiting, abdominal pain, dysuria, hematuria, melena, numbness, weakness, or tingling.   All other systems have been reviewed and were otherwise negative with the exception of those mentioned in the HPI and as above.    PHYSICAL EXAM: Filed Vitals:   08/17/12 1239  BP: 125/84  Pulse: 93  Temp: 97.7 F (36.5 C)  Resp: 16   Filed Vitals:   08/17/12 1239  Height: 6\' 1"  (1.854 m)  Weight: 323 lb (146.512 kg)   Body mass index is 42.61 kg/(m^2).  General: Alert, no acute distress, obese male HEENT:  Normocephalic, atraumatic, oropharynx patent. Tm nl, No exudates. + sinus tenderness Cardiovascular:  Regular rate and rhythm, no rubs murmurs or gallops.  No Carotid bruits, radial pulse intact. No pedal edema.  Respiratory: Clear to auscultation bilaterally.  No wheezes, rales, or rhonchi.  No cyanosis, no use of accessory musculature GI: No organomegaly, abdomen is soft and non-tender, positive bowel sounds.  No  masses. Skin: No rashes. Neurologic: Facial musculature symmetric. Psychiatric: Patient is appropriate throughout our interaction. Lymphatic: No cervical lymphadenopathy Musculoskeletal: Gait intact.   LABS:    EKG/XRAY:   Primary read interpreted by Dr. Conley Rolls at [facility].   ASSESSMENT/PLAN: Encounter Diagnoses  Name Primary?  . Acute sinusitis Yes  . Allergic rhinitis    Rx Augmentin Rx Flonase F/u prn    LE, THAO PHUONG, DO 08/17/2012 1:05 PM

## 2012-09-03 ENCOUNTER — Ambulatory Visit (INDEPENDENT_AMBULATORY_CARE_PROVIDER_SITE_OTHER): Payer: BC Managed Care – PPO | Admitting: Family Medicine

## 2012-09-03 ENCOUNTER — Encounter: Payer: Self-pay | Admitting: Family Medicine

## 2012-09-03 VITALS — BP 150/80 | HR 80 | Temp 97.9°F | Resp 16 | Ht 74.0 in | Wt 318.6 lb

## 2012-09-03 DIAGNOSIS — Z659 Problem related to unspecified psychosocial circumstances: Secondary | ICD-10-CM | POA: Insufficient documentation

## 2012-09-03 DIAGNOSIS — E119 Type 2 diabetes mellitus without complications: Secondary | ICD-10-CM

## 2012-09-03 DIAGNOSIS — R454 Irritability and anger: Secondary | ICD-10-CM | POA: Insufficient documentation

## 2012-09-03 DIAGNOSIS — H9190 Unspecified hearing loss, unspecified ear: Secondary | ICD-10-CM | POA: Insufficient documentation

## 2012-09-03 DIAGNOSIS — I10 Essential (primary) hypertension: Secondary | ICD-10-CM

## 2012-09-03 DIAGNOSIS — E7800 Pure hypercholesterolemia, unspecified: Secondary | ICD-10-CM

## 2012-09-03 DIAGNOSIS — H9193 Unspecified hearing loss, bilateral: Secondary | ICD-10-CM

## 2012-09-03 DIAGNOSIS — E669 Obesity, unspecified: Secondary | ICD-10-CM

## 2012-09-03 DIAGNOSIS — F411 Generalized anxiety disorder: Secondary | ICD-10-CM

## 2012-09-03 LAB — CBC WITH DIFFERENTIAL/PLATELET
Hemoglobin: 13.7 g/dL (ref 13.0–17.0)
Lymphocytes Relative: 42 % (ref 12–46)
Lymphs Abs: 2.5 10*3/uL (ref 0.7–4.0)
Monocytes Relative: 7 % (ref 3–12)
Neutro Abs: 2.8 10*3/uL (ref 1.7–7.7)
Neutrophils Relative %: 45 % (ref 43–77)
Platelets: 314 10*3/uL (ref 150–400)
RBC: 4.76 MIL/uL (ref 4.22–5.81)
WBC: 6 10*3/uL (ref 4.0–10.5)

## 2012-09-03 LAB — COMPREHENSIVE METABOLIC PANEL
ALT: 13 U/L (ref 0–53)
AST: 14 U/L (ref 0–37)
CO2: 30 mEq/L (ref 19–32)
Calcium: 9.4 mg/dL (ref 8.4–10.5)
Chloride: 102 mEq/L (ref 96–112)
Sodium: 140 mEq/L (ref 135–145)
Total Protein: 7.2 g/dL (ref 6.0–8.3)

## 2012-09-03 LAB — HEMOGLOBIN A1C: Hgb A1c MFr Bld: 11.2 % — ABNORMAL HIGH (ref <5.7)

## 2012-09-03 LAB — CK: Total CK: 99 U/L (ref 7–232)

## 2012-09-03 LAB — LIPID PANEL
HDL: 44 mg/dL (ref >39)
LDL Cholesterol: 97 mg/dL (ref 0–99)
Triglycerides: 47 mg/dL (ref <150)
VLDL: 9 mg/dL (ref 0–40)

## 2012-09-03 MED ORDER — FLUOXETINE HCL 20 MG PO TABS: 20.0000 mg | ORAL_TABLET | Freq: Every day | ORAL | Status: DC

## 2012-09-03 NOTE — Assessment & Plan Note (Signed)
Uncontrolled due to non-compliance with medication; obtain labs.  Recent BP for acute visit normal.

## 2012-09-03 NOTE — Assessment & Plan Note (Signed)
New.  Rx for Prozac 20mg  daily provided.  Counseling provided during visit.  Multiple stressors contributing.

## 2012-09-03 NOTE — Patient Instructions (Addendum)
Type II or unspecified type diabetes mellitus without mention of complication, not stated as uncontrolled - Plan: CBC with Differential, CK, Comprehensive metabolic panel, Hemoglobin A1c, Lipid panel  Pure hypercholesterolemia - Plan: CBC with Differential, CK, Comprehensive metabolic panel, Lipid panel  Essential hypertension, benign - Plan: CBC with Differential, CK, Comprehensive metabolic panel  Generalized anxiety disorder

## 2012-09-03 NOTE — Progress Notes (Signed)
   [address]   [place], [place]  [address]   [phone]  Subjective:    Patient ID: [name], male    DOB: Nov 18, 1960, 52 y.o.   MRN: [id number]  HPI This 52 y.o. male presents for three month follow-up:  1.  DMII:  Three month follow-up for diabetes; changes at last visit included adding Amaryl 4mg  bid.    2.  HTN:  3. Hyperlipidemia:    4.  Obesity  5. Plantar Fasciitis:  R foot; managed by Dr. Charlsie Merles; follow-up today.       Review of Systems     Objective:   Physical Exam        Assessment & Plan:

## 2012-09-03 NOTE — Assessment & Plan Note (Signed)
Controlled; obtain labs; continue current medication. 

## 2012-09-03 NOTE — Assessment & Plan Note (Addendum)
New. S/p audiometry in office; +high frequency hearing loss.  Recent sinus infection; undergoes annual hearing testing at work.  Continue to monitor.  Retest at next visit due to recent sinusitis.

## 2012-09-03 NOTE — Assessment & Plan Note (Signed)
Uncontrolled; tolerating Amaryl bid.  Obtain labs.  S/p recent ophthalmology consultation.

## 2012-09-03 NOTE — Assessment & Plan Note (Signed)
Stable; weight down from recent visit; encourage further weight loss.

## 2012-09-03 NOTE — Progress Notes (Signed)
[address]   [place], [place]  [address]   [phone]  Subjective:    Patient ID: [name], male    DOB: May 20, 1961, 52 y.o.   MRN: [id number]  HPI This 52 y.o. male presents for evaluation of the following:  1.  DMII:  Three month follow-up; changes made at last visit included adding Amaryl 4mg  bid with food.  Glucometer crashed two weeks ago; needs new rx.  Blood sugars mostly running < 200.  Has suffered with a few sugars > 200 after supper.  S/p eye exam at [facility]; +diabetic retinopathy but apparently mild.  Denies polyuria, polydipsia, weight changes.  Denies n/t in feet.  Compliance with medication; good tolerance to medication; good symptom control.  Last HgbA1c of 9.3.  2.  HTN:  Thee month follow-up; no changes to management made at last visit; has not taken medication in three days; needs to pick up refill at pharmacy.  3.  H yperlipidemia:  Three month follow-up; no changes to management made at last visit.  Reports good tolerance to medication, good compliance with medication; good symptom control  4.  R Plantar Fasciitis:  Undergoing treatment by Dr. Charlsie Merles of podiatry.  Follow-up today.    5.  Hearing Loss:  Wife has reported some hearing loss in past two weeks.  Recent treatment 08/17/12 for sinusitis.  Does have difficulties in loud surroundings.  No acute hearing loss. Undergoes hearing testing every year at work.  6.  Moodiness/stress reaction: suffering with multiple stressors with family recently; has noticed has become very moody lately.  No insomnia; no SI/HI.  +irritability at times.  Wife requesting something for mood and pt agreeable.   Review of Systems  Constitutional: Negative for fever, chills, diaphoresis and fatigue.  HENT: Positive for hearing loss, congestion, rhinorrhea and tinnitus. Negative for ear pain.   Respiratory: Negative for cough and shortness of breath.   Cardiovascular: Negative for chest pain, palpitations and leg swelling.    Gastrointestinal: Negative for nausea, vomiting, abdominal pain and diarrhea.  Skin: Negative for rash and wound.  Neurological: Negative for dizziness, syncope, facial asymmetry, speech difficulty, weakness, light-headedness, numbness and headaches.  Psychiatric/Behavioral: Positive for dysphoric mood. Negative for suicidal ideas, behavioral problems, sleep disturbance, self-injury and agitation. The patient is nervous/anxious.         Past Medical History  Diagnosis Date  . Diabetes mellitus   . Hyperlipidemia   . Hypertension   . Impotence of organic origin   . Unspecified disorder of esophagus   . Anemia, unspecified   . Obesity, unspecified   . Allergic rhinitis, cause unspecified   . Other chronic allergic conjunctivitis   . Obstructive sleep apnea (adult) (pediatric)   . Degeneration of intervertebral disc, site unspecified   . Personal history of colonic polyps   . Pain in limb     Past Surgical History  Procedure Laterality Date  . Laparoscopic gastric banding  09/15/09  . Back surgery      x3  . Quadriceps tendon repair      left  . Quadriceps repair      left  . Rotator cuff repair      right  . Appendectomy    . Tonsillectomy and adenoidectomy  1960  . Arthroscopy of knee  1980  . Colonoscopy  07/11/2009    single polyp; repeat in 5 years.  [name].  . Allergy consult  07/11/2010    SOB, itching, facial tingling, hypotension.  Allergy  testing: no food allergies; +reaction to tree pollen, grass pollen, dust mites, mold.  Avoid diclofenac and similar products.  [name].  [name] consult  07/11/2010    CT chest in ED (gastric thickening).  [name].  No EGD warranted due to lack of symptoms.  . Sleep study  07/11/2010    repeat sleep study due to weight loss.  Severe OSA; CPAP titration to 8 cm.    . Admission  01/2009    [facility].  Syncopal event with nausea, diaphoresis, hypertension.  D-dimer elevated at 2.54 with negative CT chest. and VQ scan.  Cardiac enzymes negative x  3.  Labs normal.  Cardiology consult---cardiolite abn; cath, 2D-echo, holter monitor unrevealing.  SE H&V.  Marland Kitchen Spine surgery      Prior to Admission medications   Medication Sig Start Date End Date Taking? Authorizing Provider  aspirin 81 MG tablet Take 81 mg by mouth daily.   Yes Historical Provider, MD  CALCIUM PO Take 1,500 mg by mouth daily.   Yes Historical Provider, MD  cyclobenzaprine (FLEXERIL) 5 MG tablet Ad lib. 07/13/11  Yes Historical Provider, MD  fluticasone (FLONASE) 50 MCG/ACT nasal spray Place 2 sprays into the nose daily. 08/17/12  Yes Thao P Le, DO  glimepiride (AMARYL) 4 MG tablet Take 1 tablet (4 mg total) by mouth 2 (two) times daily. 06/10/12  Yes [name], MD  INS SYRINGE/NEEDLE 1CC/28G (B-D INS SYR MICROFINE 1CC/28G) 28G X 1/2" 1 ML MISC by Does not apply route.   Yes Historical Provider, MD  insulin glargine (LANTUS) 100 UNIT/ML injection Inject 65 Units into the skin daily. 08/16/12  Yes Godfrey Pick, PA-C  metFORMIN (GLUCOPHAGE) 1000 MG tablet BID times 48H. 05/31/11  Yes Historical Provider, MD  Multiple Vitamin (MULTIVITAMIN) capsule Take 1 capsule by mouth daily.   Yes Historical Provider, MD  ramipril (ALTACE) 10 MG capsule daily. 06/28/11  Yes Historical Provider, MD  sildenafil (VIAGRA) 100 MG tablet Take 100 mg by mouth daily as needed.   Yes Historical Provider, MD  simvastatin (ZOCOR) 40 MG tablet Take 40 mg by mouth daily.   Yes Historical Provider, MD  amoxicillin-clavulanate (AUGMENTIN) 875-125 MG per tablet Take 1 tablet by mouth 2 (two) times daily. 08/17/12   Thao P Le, DO  FLUoxetine (PROZAC) 20 MG tablet Take 1 tablet (20 mg total) by mouth daily. 09/03/12   [name], MD  insulin aspart (NOVOLOG FLEXPEN) 100 UNIT/ML injection Inject 5 Units into the skin daily with supper.    Historical Provider, MD  meloxicam (MOBIC) 15 MG tablet  08/27/12   Historical Provider, MD    Allergies  Allergen Reactions  . Voltaren (Diclofenac Sodium) Anaphylaxis    . Diclofenac Sodium Itching    SOB, Hypotension     History   Social History  . Marital Status: Married    Spouse Name: N/A    Number of Children: 3  . Years of Education: N/A   Occupational History  . [employer]     Supervisor x 23 yrs   Social History Main Topics  . Smoking status: Former Smoker    Types: Cigarettes  . Smokeless tobacco: Never Used     Comment: quit 30 years ago  . Alcohol Use: Yes     Comment: occasional Beer on ce every two weeks on the weekends  . Drug Use: No  . Sexually Active: Not on file   Other Topics Concern  . Not on file  Social History Narrative   Always uses seat belts. Smoke alarm in the home. Guns in the home stored in locked cabinet. Exercise: Light. 3 x week, 20 - 30 minutes, walking. Married x 26 years, happy. Pets: Dog.    Family History  Problem Relation Age of Onset  . Cancer Father     skin  . Heart disease Father   . Diabetes Father   . Hypertension Father   . Hyperlipidemia Father   . Peripheral Artery Disease Father   . Parkinson's disease Father   . Cancer Maternal Uncle     lymphoma  . Cancer Paternal Grandfather     skin  . Diabetes Mother   . Depression Sister   . Diabetes Sister     Objective:   Physical Exam  Nursing note and vitals reviewed. Constitutional: He is oriented to person, place, and time. He appears well-developed and well-nourished. No distress.  HENT:  Head: Normocephalic and atraumatic.  Right Ear: External ear normal.  Left Ear: External ear normal.  Nose: Nose normal.  Mouth/Throat: Oropharynx is clear and moist. No oropharyngeal exudate.  Eyes: Conjunctivae and EOM are normal. Pupils are equal, round, and reactive to light.  Neck: Normal range of motion. Neck supple. No JVD present. No thyromegaly present.  Cardiovascular: Normal rate, regular rhythm, normal heart sounds and intact distal pulses.  Exam reveals no gallop and no friction rub.   No murmur  heard. Pulmonary/Chest: Effort normal and breath sounds normal. No respiratory distress. He has no wheezes. He has no rales.  Lymphadenopathy:    He has no cervical adenopathy.  Neurological: He is alert and oriented to person, place, and time. No cranial nerve deficit. He exhibits normal muscle tone. Coordination normal.  Skin: Skin is warm and dry. He is not diaphoretic. No erythema. No pallor.  Psychiatric: He has a normal mood and affect. His behavior is normal. Judgment and thought content normal.   AUDIOMETRY COMPLETED.    Assessment & Plan:  Type II or unspecified type diabetes mellitus without mention of complication, not stated as uncontrolled - Plan: CBC with Differential, CK, Comprehensive metabolic panel, Hemoglobin A1c, Lipid panel  Pure hypercholesterolemia - Plan: CBC with Differential, CK, Comprehensive metabolic panel, Lipid panel  Essential hypertension, benign - Plan: CBC with Differential, CK, Comprehensive metabolic panel  Generalized anxiety disorder  Hearing Loss

## 2012-09-11 ENCOUNTER — Encounter: Payer: Self-pay | Admitting: Family Medicine

## 2012-10-02 ENCOUNTER — Other Ambulatory Visit: Payer: Self-pay | Admitting: Physician Assistant

## 2012-10-02 NOTE — Telephone Encounter (Signed)
Dr Katrinka Blazing, your instr's on MyChart message are for pt to increase lantus to 70 units QD. Do you want to change Rx to this sig and increase quantity dispensed?

## 2012-11-29 ENCOUNTER — Other Ambulatory Visit: Payer: Self-pay | Admitting: Family Medicine

## 2012-12-04 ENCOUNTER — Other Ambulatory Visit: Payer: Self-pay | Admitting: Family Medicine

## 2012-12-04 ENCOUNTER — Ambulatory Visit (INDEPENDENT_AMBULATORY_CARE_PROVIDER_SITE_OTHER): Payer: BC Managed Care – PPO | Admitting: Family Medicine

## 2012-12-04 ENCOUNTER — Encounter: Payer: Self-pay | Admitting: Family Medicine

## 2012-12-04 VITALS — BP 138/74 | HR 85 | Temp 97.7°F | Resp 16 | Ht 72.5 in | Wt 307.0 lb

## 2012-12-04 DIAGNOSIS — H9193 Unspecified hearing loss, bilateral: Secondary | ICD-10-CM

## 2012-12-04 DIAGNOSIS — E7800 Pure hypercholesterolemia, unspecified: Secondary | ICD-10-CM

## 2012-12-04 DIAGNOSIS — F411 Generalized anxiety disorder: Secondary | ICD-10-CM

## 2012-12-04 DIAGNOSIS — H9190 Unspecified hearing loss, unspecified ear: Secondary | ICD-10-CM

## 2012-12-04 DIAGNOSIS — I10 Essential (primary) hypertension: Secondary | ICD-10-CM

## 2012-12-04 DIAGNOSIS — E119 Type 2 diabetes mellitus without complications: Secondary | ICD-10-CM

## 2012-12-04 LAB — CBC WITH DIFFERENTIAL/PLATELET
Basophils Relative: 1 % (ref 0–1)
Lymphocytes Relative: 41 % (ref 12–46)
Lymphs Abs: 2.3 10*3/uL (ref 0.7–4.0)
MCH: 29.2 pg (ref 26.0–34.0)
MCV: 84.2 fL (ref 78.0–100.0)
Monocytes Absolute: 0.5 10*3/uL (ref 0.1–1.0)
Neutrophils Relative %: 47 % (ref 43–77)
Platelets: 293 10*3/uL (ref 150–400)
RBC: 4.76 MIL/uL (ref 4.22–5.81)
RDW: 13.7 % (ref 11.5–15.5)
WBC: 5.6 10*3/uL (ref 4.0–10.5)

## 2012-12-04 LAB — COMPREHENSIVE METABOLIC PANEL
Albumin: 3.9 g/dL (ref 3.5–5.2)
BUN: 18 mg/dL (ref 6–23)
CO2: 28 mEq/L (ref 19–32)
Calcium: 9.6 mg/dL (ref 8.4–10.5)
Chloride: 98 mEq/L (ref 96–112)
Glucose, Bld: 333 mg/dL — ABNORMAL HIGH (ref 70–99)
Potassium: 4.4 mEq/L (ref 3.5–5.3)

## 2012-12-04 LAB — LIPID PANEL
Cholesterol: 226 mg/dL — ABNORMAL HIGH (ref 0–200)
HDL: 41 mg/dL (ref >39)
Triglycerides: 120 mg/dL (ref <150)

## 2012-12-04 MED ORDER — INSULIN GLARGINE 100 UNIT/ML ~~LOC~~ SOLN: 76.0000 [IU] | Freq: Every day | SUBCUTANEOUS | Status: DC

## 2012-12-04 MED ORDER — SIMVASTATIN 40 MG PO TABS: 40.0000 mg | ORAL_TABLET | Freq: Every day | ORAL | Status: DC

## 2012-12-04 NOTE — Progress Notes (Signed)
[address]   [place], [place]  [address]   [phone]  Subjective:    Patient ID: [name], male    DOB: 23-Feb-1961, 52 y.o.   MRN: [id number]  HPI This 52 y.o. male presents for three month follow-up:  1.  Anxiety/depression:  Three month follow-up; initiated Prozac 20mg  daily at last visit due to multiple family stressors.  No side effects to medication.  Wife has noticed improvement.  Pt also has noticed improvement; +less stressed. Reports compliance with medication; good tolerance to medication; good symptom control.  2. Hearing loss: three month follow-up; +high frequency hearing loss on audiometry at last visit; s/p recent sinus infection at last visit; to undergo repeat audiometry today.  Loud noise exposure at work for past thirty years. Father also with hearing loss.  3. DMII: three month follow-up; HgbA1c of 11.2 at last visit; advised to increase Lantus to 70 units daily.  Compliance with increasing Lantus to 70 units daily.   Glucometer broken; not checking sugars for past several weeks.  Has new meter; needs to get out.  Not using Novolog at current time.  4.  HTN:  Three month follow-up; weight down eleven pounds since last visit; not checking BP at home.  Reports compliance with medication; good tolerance to medication; good symptom control.   Review of Systems  Constitutional: Negative for fever, chills, diaphoresis and fatigue.  HENT: Positive for hearing loss. Negative for congestion, rhinorrhea and postnasal drip.   Respiratory: Negative for shortness of breath, wheezing and stridor.   Cardiovascular: Negative for chest pain, palpitations and leg swelling.  Endocrine: Negative for cold intolerance, heat intolerance, polydipsia, polyphagia and polyuria.  Skin: Negative for color change and wound.  Neurological: Positive for numbness. Negative for dizziness, tremors, seizures, syncope, facial asymmetry, speech difficulty, weakness, light-headedness and headaches.         Chronic numbness lateral L foot from lumbar DDD.  Psychiatric/Behavioral: Negative for sleep disturbance and dysphoric mood. The patient is not nervous/anxious.         Past Medical History  Diagnosis Date  . Diabetes mellitus   . Hyperlipidemia   . Hypertension   . Impotence of organic origin   . Unspecified disorder of esophagus   . Anemia, unspecified   . Obesity, unspecified   . Allergic rhinitis, cause unspecified   . Other chronic allergic conjunctivitis   . Obstructive sleep apnea (adult) (pediatric)   . Degeneration of intervertebral disc, site unspecified   . Personal history of colonic polyps   . Pain in limb     Past Surgical History  Procedure Laterality Date  . Laparoscopic gastric banding  09/15/09  . Back surgery      x3  . Quadriceps tendon repair      left  . Quadriceps repair      left  . Rotator cuff repair      right  . Appendectomy    . Tonsillectomy and adenoidectomy  1960  . Arthroscopy of knee  1980  . Colonoscopy  07/11/2009    single polyp; repeat in 5 years.  [name].  . Allergy consult  07/11/2010    SOB, itching, facial tingling, hypotension.  Allergy testing: no food allergies; +reaction to tree pollen, grass pollen, dust mites, mold.  Avoid diclofenac and similar products.  [name].  [name] consult  07/11/2010    CT chest in ED (gastric thickening).  [name].  No EGD warranted due to lack of symptoms.  Marland Kitchen  Sleep study  07/11/2010    repeat sleep study due to weight loss.  Severe OSA; CPAP titration to 8 cm.    . Admission  01/2009    [facility].  Syncopal event with nausea, diaphoresis, hypertension.  D-dimer elevated at 2.54 with negative CT chest. and VQ scan.  Cardiac enzymes negative x 3.  Labs normal.  Cardiology consult---cardiolite abn; cath, 2D-echo, holter monitor unrevealing.  SE H&V.  Marland Kitchen Spine surgery      Prior to Admission medications   Medication Sig Start Date End Date Taking? Authorizing Provider  aspirin 81 MG tablet Take 81 mg by  mouth daily.   Yes Historical Provider, MD  B-D INS SYR ULTRAFINE 1CC/30G 30G X 1/2" 1 ML MISC USE TO ADMINISTER INSULIN THREE (3) TIMES DAILY AS DIRECTED 11/29/12  Yes [name], MD  CALCIUM PO Take 1,500 mg by mouth daily.   Yes Historical Provider, MD  cyclobenzaprine (FLEXERIL) 5 MG tablet Ad lib. 07/13/11  Yes Historical Provider, MD  FLUoxetine (PROZAC) 20 MG tablet Take 1 tablet (20 mg total) by mouth daily. 09/03/12  Yes [name], MD  fluticasone (FLONASE) 50 MCG/ACT nasal spray Place 2 sprays into the nose daily. 08/17/12  Yes Thao P Le, DO  glimepiride (AMARYL) 4 MG tablet Take 1 tablet (4 mg total) by mouth 2 (two) times daily. 06/10/12  Yes [name], MD  insulin aspart (NOVOLOG FLEXPEN) 100 UNIT/ML injection Inject 5 Units into the skin daily with supper.   Yes Historical Provider, MD  insulin glargine (LANTUS) 100 UNIT/ML injection Inject 0.7 mLs (70 Units total) into the skin at bedtime. 10/02/12  Yes [name], MD  metFORMIN (GLUCOPHAGE) 1000 MG tablet BID times 48H. 05/31/11  Yes Historical Provider, MD  Multiple Vitamin (MULTIVITAMIN) capsule Take 1 capsule by mouth daily.   Yes Historical Provider, MD  ramipril (ALTACE) 10 MG capsule daily. 06/28/11  Yes Historical Provider, MD  sildenafil (VIAGRA) 100 MG tablet Take 100 mg by mouth daily as needed.   Yes Historical Provider, MD  simvastatin (ZOCOR) 40 MG tablet Take 40 mg by mouth daily.   Yes Historical Provider, MD  amoxicillin-clavulanate (AUGMENTIN) 875-125 MG per tablet Take 1 tablet by mouth 2 (two) times daily. 08/17/12   Thao P Le, DO  meloxicam (MOBIC) 15 MG tablet  08/27/12   Historical Provider, MD    Allergies  Allergen Reactions  . Voltaren (Diclofenac Sodium) Anaphylaxis  . Diclofenac Sodium Itching    SOB, Hypotension     History   Social History  . Marital Status: Married    Spouse Name: N/A    Number of Children: 3  . Years of Education: N/A   Occupational History  . [employer]  maintenance     Supervisor x 23 yrs   Social History Main Topics  . Smoking status: Former Smoker    Types: Cigarettes  . Smokeless tobacco: Never Used     Comment: quit 30 years ago  . Alcohol Use: Yes     Comment: occasional Beer on ce every two weeks on the weekends  . Drug Use: No  . Sexually Active: Not on file   Other Topics Concern  . Not on file   Social History Narrative   Always uses seat belts. Smoke alarm in the home. Guns in the home stored in locked cabinet. Exercise: Light. 3 x week, 20 - 30 minutes, walking. Married x 26 years, happy. Pets: Dog.  Family History  Problem Relation Age of Onset  . Cancer Father     skin  . Heart disease Father   . Diabetes Father   . Hypertension Father   . Hyperlipidemia Father   . Peripheral Artery Disease Father   . Parkinson's disease Father   . Cancer Maternal Uncle     lymphoma  . Cancer Paternal Grandfather     skin  . Diabetes Mother   . Depression Sister   . Diabetes Sister     Objective:   Physical Exam  Nursing note and vitals reviewed. Constitutional: He appears well-developed and well-nourished. No distress.  HENT:  Head: Normocephalic and atraumatic.  Right Ear: External ear normal.  Left Ear: External ear normal.  Nose: Nose normal.  Mouth/Throat: Oropharynx is clear and moist.  Eyes: Conjunctivae and EOM are normal. Pupils are equal, round, and reactive to light.  Neck: Neck supple. No JVD present. No thyromegaly present.  Cardiovascular: Normal rate, regular rhythm and normal heart sounds.  Exam reveals no gallop and no friction rub.   No murmur heard. Pulmonary/Chest: Effort normal and breath sounds normal. He has no wheezes. He has no rales.  Lymphadenopathy:    He has no cervical adenopathy.  Neurological: He is alert. No cranial nerve deficit. He exhibits normal muscle tone. Coordination normal.  Monofilament intact RLE; decreased sensation L foot laterally.  Skin: Skin is warm and dry. No  rash noted. He is not diaphoretic. No erythema.  Psychiatric: He has a normal mood and affect. His behavior is normal. Judgment and thought content normal.      Results for orders placed in visit on 12/04/12  POCT GLYCOSYLATED HEMOGLOBIN (HGB A1C)      Result Value Range   Hemoglobin A1C 9.9         Assessment & Plan:  Pure hypercholesterolemia - Plan: Lipid panel  Type II or unspecified type diabetes mellitus without mention of complication, not stated as uncontrolled - Plan: POCT glycosylated hemoglobin (Hb A1C)  Essential hypertension, benign - Plan: CBC with Differential, Comprehensive metabolic panel, CK  Hearing loss, bilateral  Generalized anxiety disorder   Meds ordered this encounter  Medications  . simvastatin (ZOCOR) 40 MG tablet    Sig: Take 1 tablet (40 mg total) by mouth daily.    Dispense:  30 tablet    Refill:  11

## 2012-12-04 NOTE — Assessment & Plan Note (Signed)
Persistent; advised to wear protective hearing gear at work and home.

## 2012-12-04 NOTE — Assessment & Plan Note (Signed)
Improved; continue current medication; obtain labs.

## 2012-12-04 NOTE — Assessment & Plan Note (Signed)
Improved; continue Prozac 20mg  daily; advised to continue for minimum of six months.  Tolerating without side effects.

## 2012-12-04 NOTE — Assessment & Plan Note (Signed)
Uncontrolled but improved.  Increase Lantus to 76 units daily.  Obtain labs.

## 2012-12-04 NOTE — Assessment & Plan Note (Signed)
Controlled; obtain labs; refill provided; out of medication for past week.

## 2012-12-10 ENCOUNTER — Encounter: Payer: Self-pay | Admitting: Family Medicine

## 2012-12-11 NOTE — Telephone Encounter (Signed)
I do not see this on his problem list.

## 2012-12-19 ENCOUNTER — Encounter: Payer: Self-pay | Admitting: Family Medicine

## 2013-01-16 ENCOUNTER — Other Ambulatory Visit: Payer: Self-pay | Admitting: Family Medicine

## 2013-02-07 ENCOUNTER — Other Ambulatory Visit: Payer: Self-pay | Admitting: Family Medicine

## 2013-03-04 ENCOUNTER — Encounter: Payer: BC Managed Care – PPO | Admitting: Family Medicine

## 2013-04-01 ENCOUNTER — Encounter: Payer: Self-pay | Admitting: Family Medicine

## 2013-04-01 ENCOUNTER — Ambulatory Visit (INDEPENDENT_AMBULATORY_CARE_PROVIDER_SITE_OTHER): Payer: BC Managed Care – PPO | Admitting: Family Medicine

## 2013-04-01 VITALS — BP 139/82 | HR 90 | Temp 98.4°F | Resp 16 | Ht 73.5 in | Wt 311.0 lb

## 2013-04-01 DIAGNOSIS — J0101 Acute recurrent maxillary sinusitis: Secondary | ICD-10-CM

## 2013-04-01 DIAGNOSIS — Z23 Encounter for immunization: Secondary | ICD-10-CM

## 2013-04-01 DIAGNOSIS — IMO0001 Reserved for inherently not codable concepts without codable children: Secondary | ICD-10-CM

## 2013-04-01 DIAGNOSIS — Z0000 Encounter for general adult medical examination without abnormal findings: Secondary | ICD-10-CM

## 2013-04-01 DIAGNOSIS — L309 Dermatitis, unspecified: Secondary | ICD-10-CM

## 2013-04-01 DIAGNOSIS — E119 Type 2 diabetes mellitus without complications: Secondary | ICD-10-CM

## 2013-04-01 DIAGNOSIS — J301 Allergic rhinitis due to pollen: Secondary | ICD-10-CM

## 2013-04-01 LAB — POCT URINALYSIS DIPSTICK
Bilirubin, UA: NEGATIVE
Leukocytes, UA: NEGATIVE
Nitrite, UA: NEGATIVE
Protein, UA: NEGATIVE
Urobilinogen, UA: 0.2

## 2013-04-01 LAB — COMPREHENSIVE METABOLIC PANEL
ALT: 13 U/L (ref 0–53)
AST: 12 U/L (ref 0–37)
Albumin: 4.1 g/dL (ref 3.5–5.2)
Calcium: 9.9 mg/dL (ref 8.4–10.5)
Chloride: 101 mEq/L (ref 96–112)
Potassium: 4.7 mEq/L (ref 3.5–5.3)

## 2013-04-01 LAB — POCT UA - MICROSCOPIC ONLY
Bacteria, U Microscopic: NEGATIVE
Casts, Ur, LPF, POC: NEGATIVE
Crystals, Ur, HPF, POC: NEGATIVE

## 2013-04-01 LAB — LIPID PANEL
Total CHOL/HDL Ratio: 5.3 Ratio
VLDL: 22 mg/dL (ref 0–40)

## 2013-04-01 LAB — CBC
Platelets: 310 10*3/uL (ref 150–400)
RDW: 13.7 % (ref 11.5–15.5)
WBC: 5.5 10*3/uL (ref 4.0–10.5)

## 2013-04-01 MED ORDER — FLUTICASONE PROPIONATE 50 MCG/ACT NA SUSP: 2.0000 | Freq: Every day | NASAL | Status: DC

## 2013-04-01 MED ORDER — FLUOXETINE HCL 20 MG PO TABS: 20.0000 mg | ORAL_TABLET | Freq: Every day | ORAL | Status: DC

## 2013-04-01 MED ORDER — RAMIPRIL 10 MG PO CAPS: 10.0000 mg | ORAL_CAPSULE | Freq: Every day | ORAL | Status: DC

## 2013-04-01 MED ORDER — KETOCONAZOLE 2 % EX CREA: TOPICAL_CREAM | Freq: Two times a day (BID) | CUTANEOUS | Status: DC

## 2013-04-01 MED ORDER — METFORMIN HCL 1000 MG PO TABS: 1000.0000 mg | ORAL_TABLET | Freq: Two times a day (BID) | ORAL | Status: DC

## 2013-04-01 MED ORDER — GLIMEPIRIDE 4 MG PO TABS: 4.0000 mg | ORAL_TABLET | Freq: Two times a day (BID) | ORAL | Status: DC

## 2013-04-01 MED ORDER — SIMVASTATIN 40 MG PO TABS: 40.0000 mg | ORAL_TABLET | Freq: Every day | ORAL | Status: DC

## 2013-04-01 NOTE — Progress Notes (Signed)
[address]   [place], [place]  [address]   [phone]  Subjective:    Patient ID: [name], male    DOB: 06-26-1961, 52 y.o.   MRN: [id number]  HPI This 52 y.o. male presents for complete physical exam.  "pretty good." Daughter at [employer]. Doing "great."  Last CPE- 1 year Colo- 2011, due 2021 Tetanus- 2012 Pneum- 2008 Flu- today Hep B- has had series twice  Eye- 1 year ago, due. [facility]. Dentist- this month  Lantis 76 units, blood sugar 200 this morning, out of metformin. Usually 120-150. Has had a few low blood sugar middle of night- 2-3x in last couple of months. Gets up and has some milk.   Back and legs doing ok- rarely needs flexeril. Mood ok. OTC Allegra as needed. Allergies not bad. Viagra works fine. Married 28 years. 3 children (27,24/24). Three grand children, one great grand child.  No tob, occasional ETOH (1-2x month). Walking 2-3 x week. Wears seatbelt. Wears sunscreen. Guns in gun safe.  Father 31- parkinsons, DM, CAD stents, MI x 3 (early 4s) Mother 69- DM, chronic hives, anxiety Sisters x 2- DM, mental illness  Allergy testing previously  CPAP every night, feels good in am.  Will be making an appointment with Dr. Daphine Deutscher soon  Review of Systems No dizziness, no headaches, no problems with ears. No mouth sores that won't heal No chest pain, no palpitations, no SOB, no cough No neck pain, right shoulder pain occasional (surgery), no elbow pain, no wrist pain, left foot nerve pain. Left shin with skin lesion from bumping while helping to build deck. Checks feet regularly, wears shoes outside. Wears socks inside. No constipation, no diarrhea. No abdominal pain. Sleeps 7-8 hours a night. No urinary symptoms, stream unchanged. Occasional nocturia. No nervousness, no sadness, no anger. No genital pain or swelling. Occasional itching of rash on rectal area.    Past Medical History  Diagnosis Date  . Diabetes mellitus   . Hyperlipidemia    . Hypertension   . Impotence of organic origin   . Unspecified disorder of esophagus   . Anemia, unspecified   . Obesity, unspecified   . Allergic rhinitis, cause unspecified   . Other chronic allergic conjunctivitis   . Obstructive sleep apnea (adult) (pediatric)   . Degeneration of intervertebral disc, site unspecified   . Personal history of colonic polyps   . Pain in limb    Past Surgical History  Procedure Laterality Date  . Laparoscopic gastric banding  09/15/09  . Back surgery      x3  . Quadriceps tendon repair      left  . Quadriceps repair      left  . Rotator cuff repair      right  . Appendectomy    . Tonsillectomy and adenoidectomy  1960  . Arthroscopy of knee  1980  . Colonoscopy  07/11/2009    single polyp; repeat in 5 years.  [name].  . Allergy consult  07/11/2010    SOB, itching, facial tingling, hypotension.  Allergy testing: no food allergies; +reaction to tree pollen, grass pollen, dust mites, mold.  Avoid diclofenac and similar products.  [name].  [name] consult  07/11/2010    CT chest in ED (gastric thickening).  [name].  No EGD warranted due to lack of symptoms.  . Sleep study  07/11/2010    repeat sleep study due to weight loss.  Severe OSA; CPAP titration to 8 cm.    Marland Kitchen  Admission  01/2009    [facility].  Syncopal event with nausea, diaphoresis, hypertension.  D-dimer elevated at 2.54 with negative CT chest. and VQ scan.  Cardiac enzymes negative x 3.  Labs normal.  Cardiology consult---cardiolite abn; cath, 2D-echo, holter monitor unrevealing.  SE H&V.  Marland Kitchen Spine surgery     Allergies  Allergen Reactions  . Voltaren [Diclofenac Sodium] Anaphylaxis  . Diclofenac Sodium Itching    SOB, Hypotension    Current Outpatient Prescriptions on File Prior to Visit  Medication Sig Dispense Refill  . aspirin 81 MG tablet Take 81 mg by mouth daily.      . B-D INS SYR ULTRAFINE 1CC/30G 30G X 1/2" 1 ML MISC USE TO ADMINISTER INSULIN THREE (3) TIMES DAILY AS DIRECTED  100 each   2  . B-D INS SYR ULTRAFINE 1CC/30G 30G X 1/2" 1 ML MISC USE TO ADMINISTER INSULIN THREE (3) TIMES DAILY AS DIRECTED  100 each  2  . CALCIUM PO Take 1,500 mg by mouth daily.      . cyclobenzaprine (FLEXERIL) 5 MG tablet Ad lib.      Marland Kitchen insulin glargine (LANTUS) 100 UNIT/ML injection Inject 0.76 mLs (76 Units total) into the skin at bedtime.  20 mL  11  . Multiple Vitamin (MULTIVITAMIN) capsule Take 1 capsule by mouth daily.      . sildenafil (VIAGRA) 100 MG tablet Take 100 mg by mouth daily as needed.       No current facility-administered medications on file prior to visit.   History   Social History  . Marital Status: Married    Spouse Name: N/A    Number of Children: 3  . Years of Education: N/A   Occupational History  . [employer]     Supervisor x 23 yrs   Social History Main Topics  . Smoking status: Former Smoker    Types: Cigarettes  . Smokeless tobacco: Never Used     Comment: quit 30 years ago  . Alcohol Use: Yes     Comment: occasional Beer on ce every two weeks on the weekends  . Drug Use: No  . Sexual Activity: Yes   Other Topics Concern  . Not on file   Social History Narrative   Always uses seat belts. Smoke alarm in the home. Guns in the home stored in locked cabinet. Exercise: Light. 3 x week, 20 - 30 minutes, walking. Married x 26 years, happy. Pets: Dog.   Family History  Problem Relation Age of Onset  . Cancer Father     skin  . Heart disease Father 38    AMI first age 36; stents.  . Diabetes Father   . Hypertension Father   . Hyperlipidemia Father   . Peripheral Artery Disease Father   . Parkinson's disease Father   . Cancer Maternal Uncle     lymphoma  . Cancer Paternal Grandfather     skin  . Diabetes Mother   . Depression Sister   . Diabetes Sister    Objective:   Physical Exam  Nursing note and vitals reviewed. Constitutional: He is oriented to person, place, and time. He appears well-developed and well-nourished. No  distress.  HENT:  Head: Normocephalic and atraumatic.  Right Ear: External ear normal.  Left Ear: External ear normal.  Nose: Nose normal.  Mouth/Throat: Oropharynx is clear and moist.  Eyes: Conjunctivae and EOM are normal. Pupils are equal, round, and reactive to light.  Neck: Normal range of motion.  Neck supple. No JVD present. No thyromegaly present.  Cardiovascular: Normal rate, regular rhythm, normal heart sounds and intact distal pulses.  Exam reveals no gallop and no friction rub.   No murmur heard. Pulmonary/Chest: Effort normal and breath sounds normal. He has no wheezes. He has no rales.  Abdominal: Soft. Bowel sounds are normal. He exhibits no distension and no mass. There is no tenderness. There is no rebound and no guarding. Hernia confirmed negative in the right inguinal area and confirmed negative in the left inguinal area.  Genitourinary: Rectum normal, prostate normal, testes normal and penis normal. Circumcised.  Musculoskeletal: Normal range of motion.  Lymphadenopathy:    He has no cervical adenopathy.       Right: No inguinal adenopathy present.       Left: No inguinal adenopathy present.  Neurological: He is alert and oriented to person, place, and time. He has normal reflexes. No cranial nerve deficit. He exhibits normal muscle tone. Coordination normal.  Skin: Skin is warm and dry. Rash noted. He is not diaphoretic. No erythema.  Scaling hyperpigmented rash B buttocks along gluteal fold.  No erythema.  No induration.  Psychiatric: He has a normal mood and affect. His behavior is normal. Judgment and thought content normal.   INFLUENZA VACCINE ADMINISTERED.  Results for orders placed in visit on 04/01/13  POCT URINALYSIS DIPSTICK      Result Value Range   Color, UA yellow     Clarity, UA clear     Glucose, UA >=1000     Bilirubin, UA neg     Ketones, UA trace     Spec Grav, UA 1.015     Blood, UA trace-lysed     pH, UA 5.5     Protein, UA neg      Urobilinogen, UA 0.2     Nitrite, UA neg     Leukocytes, UA Negative    POCT UA - MICROSCOPIC ONLY      Result Value Range   WBC, Ur, HPF, POC 0-1     RBC, urine, microscopic 0-3     Bacteria, U Microscopic neg     Mucus, UA trace     Epithelial cells, urine per micros 0-3     Crystals, Ur, HPF, POC neg     Casts, Ur, LPF, POC neg     Yeast, UA neg    POCT GLYCOSYLATED HEMOGLOBIN (HGB A1C)      Result Value Range   Hemoglobin A1C 10.1         Assessment & Plan:  Diabetes - Plan: HM Diabetes Foot Exam, POCT glycosylated hemoglobin (Hb A1C), CANCELED: Hemoglobin A1c, CANCELED: Hemoglobin A1c  Need for influenza vaccination - Plan: Flu Vaccine QUAD 36+ mos IM, Flu Vaccine QUAD 36+ mos IM  Routine general medical examination at a health care facility - Plan: CBC, Comprehensive metabolic panel, TSH, PSA, Vit D  25 hydroxy (rtn osteoporosis monitoring), CK, Lipid panel, POCT urinalysis dipstick, Microalbumin, urine, EKG 12-Lead, POCT UA - Microscopic Only, CANCELED: Hemoglobin A1c  Acute sinusitis - Plan: fluticasone (FLONASE) 50 MCG/ACT nasal spray  Allergic rhinitis - Plan: fluticasone (FLONASE) 50 MCG/ACT nasal spray  Dermatitis  1.  CPE: anticipatory guidance --- further weight loss, exercise.  Colonoscopy UTD.  Immunizations UTD; s/p flu vaccine in office.  Obtain labs. 2.  DMII:  Uncontrolled; obtain labs; due for eye exam; patient to schedule. S/p monofilament exam intact; obtain urine microalbumin.  Refills provided. 3.  Allergic Rhinitis: worsening; refill  of Flonase provided. 4.  Dermatitis:  New. Rx for Ketoconazole cream provided to use bid for two weeks; advised to call if rash not improved in 2-3 weeks.  Meds ordered this encounter  Medications  . FLUoxetine (PROZAC) 20 MG tablet    Sig: Take 1 tablet (20 mg total) by mouth daily.    Dispense:  30 tablet    Refill:  11  . fluticasone (FLONASE) 50 MCG/ACT nasal spray    Sig: Place 2 sprays into the nose daily.     Dispense:  16 g    Refill:  11  . glimepiride (AMARYL) 4 MG tablet    Sig: Take 1 tablet (4 mg total) by mouth 2 (two) times daily.    Dispense:  60 tablet    Refill:  11  . metFORMIN (GLUCOPHAGE) 1000 MG tablet    Sig: Take 1 tablet (1,000 mg total) by mouth 2 (two) times daily with a meal.    Dispense:  60 tablet    Refill:  11  . ramipril (ALTACE) 10 MG capsule    Sig: Take 1 capsule (10 mg total) by mouth daily.    Dispense:  30 capsule    Refill:  11  . simvastatin (ZOCOR) 40 MG tablet    Sig: Take 1 tablet (40 mg total) by mouth daily.    Dispense:  30 tablet    Refill:  11  . ketoconazole (NIZORAL) 2 % cream    Sig: Apply topically 2 (two) times daily.    Dispense:  15 g    Refill:  0   Nilda Simmer, M.D.  [facility] [address] [place], [place]  [address] [phone] phone [phone] fax

## 2013-04-02 LAB — VITAMIN D 25 HYDROXY (VIT D DEFICIENCY, FRACTURES): Vit D, 25-Hydroxy: 26 ng/mL — ABNORMAL LOW (ref 30–89)

## 2013-04-02 LAB — MICROALBUMIN, URINE: Microalb, Ur: 0.5 mg/dL (ref 0.00–1.89)

## 2013-04-05 ENCOUNTER — Encounter: Payer: Self-pay | Admitting: Family Medicine

## 2013-07-08 ENCOUNTER — Ambulatory Visit: Payer: BC Managed Care – PPO | Admitting: Family Medicine

## 2013-07-11 DIAGNOSIS — I2510 Atherosclerotic heart disease of native coronary artery without angina pectoris: Secondary | ICD-10-CM

## 2013-07-11 HISTORY — DX: Atherosclerotic heart disease of native coronary artery without angina pectoris: I25.10

## 2013-09-16 ENCOUNTER — Encounter: Payer: Self-pay | Admitting: Family Medicine

## 2013-09-16 ENCOUNTER — Ambulatory Visit (INDEPENDENT_AMBULATORY_CARE_PROVIDER_SITE_OTHER): Payer: BC Managed Care – PPO | Admitting: Family Medicine

## 2013-09-16 VITALS — BP 144/72 | HR 82 | Temp 97.8°F | Resp 16 | Ht 73.0 in | Wt 317.4 lb

## 2013-09-16 DIAGNOSIS — E1165 Type 2 diabetes mellitus with hyperglycemia: Secondary | ICD-10-CM

## 2013-09-16 DIAGNOSIS — J309 Allergic rhinitis, unspecified: Secondary | ICD-10-CM

## 2013-09-16 DIAGNOSIS — F411 Generalized anxiety disorder: Secondary | ICD-10-CM

## 2013-09-16 DIAGNOSIS — IMO0001 Reserved for inherently not codable concepts without codable children: Secondary | ICD-10-CM

## 2013-09-16 DIAGNOSIS — E7800 Pure hypercholesterolemia, unspecified: Secondary | ICD-10-CM

## 2013-09-16 DIAGNOSIS — J019 Acute sinusitis, unspecified: Secondary | ICD-10-CM

## 2013-09-16 DIAGNOSIS — H9190 Unspecified hearing loss, unspecified ear: Secondary | ICD-10-CM

## 2013-09-16 DIAGNOSIS — I10 Essential (primary) hypertension: Secondary | ICD-10-CM

## 2013-09-16 LAB — CBC WITH DIFFERENTIAL/PLATELET
BASOS ABS: 0.1 10*3/uL (ref 0.0–0.1)
Basophils Relative: 1 % (ref 0–1)
EOS PCT: 3 % (ref 0–5)
Eosinophils Absolute: 0.2 10*3/uL (ref 0.0–0.7)
HCT: 41.4 % (ref 39.0–52.0)
Hemoglobin: 14.1 g/dL (ref 13.0–17.0)
LYMPHS PCT: 38 % (ref 12–46)
Lymphs Abs: 2.3 10*3/uL (ref 0.7–4.0)
MCH: 29.5 pg (ref 26.0–34.0)
MCHC: 34.1 g/dL (ref 30.0–36.0)
MCV: 86.6 fL (ref 78.0–100.0)
MONO ABS: 0.5 10*3/uL (ref 0.1–1.0)
MONOS PCT: 8 % (ref 3–12)
Neutro Abs: 3.1 10*3/uL (ref 1.7–7.7)
Neutrophils Relative %: 50 % (ref 43–77)
PLATELETS: 293 10*3/uL (ref 150–400)
RBC: 4.78 MIL/uL (ref 4.22–5.81)
RDW: 13.9 % (ref 11.5–15.5)
WBC: 6.1 10*3/uL (ref 4.0–10.5)

## 2013-09-16 LAB — COMPLETE METABOLIC PANEL WITH GFR
ALBUMIN: 4 g/dL (ref 3.5–5.2)
ALT: 13 U/L (ref 0–53)
AST: 12 U/L (ref 0–37)
Alkaline Phosphatase: 73 U/L (ref 39–117)
BUN: 12 mg/dL (ref 6–23)
CALCIUM: 9.4 mg/dL (ref 8.4–10.5)
CHLORIDE: 100 meq/L (ref 96–112)
CO2: 31 mEq/L (ref 19–32)
Creat: 0.7 mg/dL (ref 0.50–1.35)
GFR, Est Non African American: >89 mL/min
Glucose, Bld: 239 mg/dL — ABNORMAL HIGH (ref 70–99)
Potassium: 4.2 mEq/L (ref 3.5–5.3)
Sodium: 139 mEq/L (ref 135–145)
Total Bilirubin: 0.5 mg/dL (ref 0.2–1.2)
Total Protein: 7.3 g/dL (ref 6.0–8.3)

## 2013-09-16 LAB — LIPID PANEL
CHOLESTEROL: 213 mg/dL — AB (ref 0–200)
HDL: 47 mg/dL (ref >39)
LDL Cholesterol: 146 mg/dL — ABNORMAL HIGH (ref 0–99)
TRIGLYCERIDES: 98 mg/dL (ref <150)
Total CHOL/HDL Ratio: 4.5 Ratio
VLDL: 20 mg/dL (ref 0–40)

## 2013-09-16 LAB — CK: Total CK: 65 U/L (ref 7–232)

## 2013-09-16 LAB — POCT GLYCOSYLATED HEMOGLOBIN (HGB A1C): Hemoglobin A1C: 10.3

## 2013-09-16 MED ORDER — FLUTICASONE PROPIONATE 50 MCG/ACT NA SUSP: 2.0000 | Freq: Every day | NASAL | Status: DC

## 2013-09-16 MED ORDER — METFORMIN HCL 1000 MG PO TABS: 1000.0000 mg | ORAL_TABLET | Freq: Two times a day (BID) | ORAL | Status: DC

## 2013-09-16 MED ORDER — SIMVASTATIN 40 MG PO TABS: 40.0000 mg | ORAL_TABLET | Freq: Every day | ORAL | Status: DC

## 2013-09-16 MED ORDER — INSULIN GLARGINE 100 UNIT/ML ~~LOC~~ SOLN: 76.0000 [IU] | Freq: Every day | SUBCUTANEOUS | Status: DC

## 2013-09-16 MED ORDER — RAMIPRIL 10 MG PO CAPS: 10.0000 mg | ORAL_CAPSULE | Freq: Every day | ORAL | Status: DC

## 2013-09-16 MED ORDER — FLUOXETINE HCL 20 MG PO TABS: 20.0000 mg | ORAL_TABLET | Freq: Every day | ORAL | Status: DC

## 2013-09-16 MED ORDER — INSULIN ASPART 100 UNIT/ML CARTRIDGE (PENFILL): 5.0000 [IU] | Freq: Three times a day (TID) | SUBCUTANEOUS | Status: DC

## 2013-09-16 MED ORDER — AMOXICILLIN 500 MG PO CAPS: 1000.0000 mg | ORAL_CAPSULE | Freq: Two times a day (BID) | ORAL | Status: DC

## 2013-09-16 MED ORDER — CYCLOBENZAPRINE HCL 5 MG PO TABS: 5.0000 mg | ORAL_TABLET | Freq: Every day | ORAL | Status: DC

## 2013-09-16 NOTE — Progress Notes (Signed)
This chart was scribed for [name] Honour, MD by [name], ED Scribe. This patient was seen in room 21 and the patient's care was started at 10:55 AM. Subjective:    Patient ID: [name], male    DOB: Jun 21, 1961, 53 y.o.   MRN: [id number]  09/16/2013  3 month follow up, Hyperlipidemia, Diabetes and Hypertension   HPI HPI Comments: [name] is a 53 y.o. male who presents to the [facility] requesting a follow up for DM, hypertension, and hyperlipidemia. Pt was seen in September for a CPE. His A1C was 10.1 and was sent an e-mail to update his medication, but pt never replied nor read  the e-mail.    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 So could not get to check his e-mail. Pt reports that his fasting blood sugar in the AM fluctuates between 120-150. He states that his sugar spikes after consumption.   He states that he is taking his prescribed medication daily. Pt states that he checks his sugar every other day whenever he gets a chance. He has not had the chance to check his blood sugar today.   Pt has gained 6 lbs since his last visit. The last time he was seen his cholesterol was a bit high. He states that he's been out of medication for a couple of weeks.   Told pt that his BP is a bit elevated. He states that he has been taking his BP medication regularly. But working night shifts may have thrown off the times he takes his BP. Pt states that the elevated blood pressure is due to recent family issues.   Wife states that her husband has been complaining of nasal congestion since November. Pt states that he blows his nose a couple of times but does not experience any other adverse symptoms. He reports taking Allegra daily to relieve his symptoms.  Pt has not been taking Flonase; he does not like to use nasal sprays.  Wife has noticed worsening hearing in patient for past six months.  Pt states that this morning upon waking  it felt like there was a bug moving around in his right ear. He is requesting an ear exam. Denies any chest pain, fever, chills, diaphoresis, cough, SOB, joint swelling, nausea, emesis, GERD or abdominal pain.   Pt is concerned of potential hearing loss. Advised pt to continue to wear hearing protective gear at work mostly.   He is requesting a refill on all his medications. Pt reports getting his Flu vaccine 04/01/2013.    Review of Systems  Constitutional: Negative for fever, chills and diaphoresis.  HENT: Positive for congestion, hearing loss, postnasal drip, rhinorrhea and sinus pressure. Negative for ear pain, sore throat and voice change.   Eyes: Negative for photophobia and visual disturbance.  Respiratory: Negative for cough and shortness of breath.   Cardiovascular: Negative for chest pain.  Gastrointestinal: Negative for nausea, vomiting and abdominal pain.  Endocrine: Negative for cold intolerance, heat intolerance, polydipsia, polyphagia and polyuria.  Musculoskeletal: Negative for joint swelling.  Skin: Negative for color change, pallor, rash and wound.  Neurological: Negative for dizziness, tremors, seizures, syncope, facial asymmetry, speech difficulty, weakness, light-headedness, numbness and headaches.  Psychiatric/Behavioral: Negative for suicidal ideas, sleep disturbance, self-injury and dysphoric mood. The patient is not nervous/anxious.     Past Medical History  Diagnosis Date  . Diabetes mellitus   . Hyperlipidemia   .  Hypertension   . Impotence of organic origin   . Unspecified disorder of esophagus   . Anemia, unspecified   . Obesity, unspecified   . Allergic rhinitis, cause unspecified   . Other chronic allergic conjunctivitis   . Obstructive sleep apnea (adult) (pediatric)   . Degeneration of intervertebral disc, site unspecified   . Personal history of colonic polyps   . Pain in limb    Allergies  Allergen Reactions  . Voltaren [Diclofenac Sodium]  Anaphylaxis  . Diclofenac Sodium Itching    SOB, Hypotension    Current Outpatient Prescriptions  Medication Sig Dispense Refill  . aspirin 81 MG tablet Take 81 mg by mouth daily.      . B-D INS SYR ULTRAFINE 1CC/30G 30G X 1/2" 1 ML MISC USE TO ADMINISTER INSULIN THREE (3) TIMES DAILY AS DIRECTED  100 each  2  . B-D INS SYR ULTRAFINE 1CC/30G 30G X 1/2" 1 ML MISC USE TO ADMINISTER INSULIN THREE (3) TIMES DAILY AS DIRECTED  100 each  2  . CALCIUM PO Take 1,500 mg by mouth daily.      . cyclobenzaprine (FLEXERIL) 5 MG tablet Take 1 tablet (5 mg total) by mouth at bedtime.  30 tablet  5  . FLUoxetine (PROZAC) 20 MG tablet Take 1 tablet (20 mg total) by mouth daily.  30 tablet  11  . fluticasone (FLONASE) 50 MCG/ACT nasal spray Place 2 sprays into both nostrils daily.  16 g  11  . glimepiride (AMARYL) 4 MG tablet Take 1 tablet (4 mg total) by mouth 2 (two) times daily.  60 tablet  11  . insulin glargine (LANTUS) 100 UNIT/ML injection Inject 0.76 mLs (76 Units total) into the skin at bedtime.  20 mL  11  . ketoconazole (NIZORAL) 2 % cream Apply topically 2 (two) times daily.  15 g  0  . metFORMIN (GLUCOPHAGE) 1000 MG tablet Take 1 tablet (1,000 mg total) by mouth 2 (two) times daily with a meal.  60 tablet  11  . Multiple Vitamin (MULTIVITAMIN) capsule Take 1 capsule by mouth daily.      . ramipril (ALTACE) 10 MG capsule Take 1 capsule (10 mg total) by mouth daily.  30 capsule  11  . sildenafil (VIAGRA) 100 MG tablet Take 100 mg by mouth daily as needed.      . simvastatin (ZOCOR) 40 MG tablet Take 1 tablet (40 mg total) by mouth daily.  30 tablet  11  . amoxicillin (AMOXIL) 500 MG capsule Take 2 capsules (1,000 mg total) by mouth 2 (two) times daily.  40 capsule  0  . insulin aspart (NOVOLOG) cartridge Inject 5-15 Units into the skin 3 (three) times daily with meals.  15 mL  11   No current facility-administered medications for this visit.   History   Social History  . Marital Status:  Married    Spouse Name: N/A    Number of Children: 3  . Years of Education: N/A   Occupational History  . [employer]     Supervisor x 23 yrs   Social History Main Topics  . Smoking status: Former Smoker    Types: Cigarettes  . Smokeless tobacco: Never Used     Comment: quit 30 years ago  . Alcohol Use: Yes     Comment: occasional Beer on ce every two weeks on the weekends  . Drug Use: No  . Sexual Activity: Yes   Other Topics Concern  . Not on  file   Social History Narrative   Married x 26 years, happy.       Children: 3 children (27, 27, 24); 3 grandchildren; 1 gg.       Lives: with wife, dog.      Employment: employed.      Tobacco: never      Alcohol: beer twice per month on weekends.      Drugs: none      Exercise: walking three days per week.      Seatbelt: 100% of time      Guns: loaded and secured/locked.      Sexual activity: sexually active with wife.      Sunscreen: yes.       Objective:    Triage Vitals:BP 144/72  Pulse 82  Temp(Src) 97.8 F (36.6 C) (Oral)  Resp 16  Ht 6' 1"  (1.854 m)  Wt 317 lb 6.4 oz (143.972 kg)  BMI 41.89 kg/m2  SpO2 97%  Physical Exam  Nursing note and vitals reviewed. Constitutional: He is oriented to person, place, and time. He appears well-developed and well-nourished. No distress.  HENT:  Head: Normocephalic and atraumatic.  Right Ear: Tympanic membrane and external ear normal.  Left Ear: Tympanic membrane and external ear normal.  Mouth/Throat: No oropharyngeal exudate.  Drainage in nose and posterior oropharynx.   Eyes: Conjunctivae and EOM are normal. Pupils are equal, round, and reactive to light. Right eye exhibits no discharge. Left eye exhibits no discharge.  Neck: Normal range of motion. Neck supple. No thyromegaly present.  Cardiovascular: Normal rate, regular rhythm, normal heart sounds and intact distal pulses.  Exam reveals no gallop and no friction rub.   No murmur heard. Pulmonary/Chest:  Effort normal and breath sounds normal. No respiratory distress.  Abdominal: Soft. Bowel sounds are normal. He exhibits no distension and no mass. There is no tenderness. There is no rebound and no guarding.  Musculoskeletal: He exhibits no edema and no tenderness.  Lymphadenopathy:    He has no cervical adenopathy.  Neurological: He is alert and oriented to person, place, and time. No cranial nerve deficit.  Skin: Skin is warm and dry. No rash noted. He is not diaphoretic. No erythema. No pallor.  Psychiatric: He has a normal mood and affect. His behavior is normal. Thought content normal.    Results for orders placed in visit on 09/16/13  CBC WITH DIFFERENTIAL      Result Value Ref Range   WBC 6.1  4.0 - 10.5 K/uL   RBC 4.78  4.22 - 5.81 MIL/uL   Hemoglobin 14.1  13.0 - 17.0 g/dL   HCT 41.4  39.0 - 52.0 %   MCV 86.6  78.0 - 100.0 fL   MCH 29.5  26.0 - 34.0 pg   MCHC 34.1  30.0 - 36.0 g/dL   RDW 13.9  11.5 - 15.5 %   Platelets 293  150 - 400 K/uL   Neutrophils Relative % 50  43 - 77 %   Neutro Abs 3.1  1.7 - 7.7 K/uL   Lymphocytes Relative 38  12 - 46 %   Lymphs Abs 2.3  0.7 - 4.0 K/uL   Monocytes Relative 8  3 - 12 %   Monocytes Absolute 0.5  0.1 - 1.0 K/uL   Eosinophils Relative 3  0 - 5 %   Eosinophils Absolute 0.2  0.0 - 0.7 K/uL   Basophils Relative 1  0 - 1 %   Basophils Absolute 0.1  0.0 -  0.1 K/uL   Smear Review Criteria for review not met    COMPLETE METABOLIC PANEL WITH GFR      Result Value Ref Range   Sodium 139  135 - 145 mEq/L   Potassium 4.2  3.5 - 5.3 mEq/L   Chloride 100  96 - 112 mEq/L   CO2 31  19 - 32 mEq/L   Glucose, Bld 239 (*) 70 - 99 mg/dL   BUN 12  6 - 23 mg/dL   Creat 0.70  0.50 - 1.35 mg/dL   Total Bilirubin 0.5  0.2 - 1.2 mg/dL   Alkaline Phosphatase 73  39 - 117 U/L   AST 12  0 - 37 U/L   ALT 13  0 - 53 U/L   Total Protein 7.3  6.0 - 8.3 g/dL   Albumin 4.0  3.5 - 5.2 g/dL   Calcium 9.4  8.4 - 10.5 mg/dL   GFR, Est African American >89       GFR, Est Non African American >89    LIPID PANEL      Result Value Ref Range   Cholesterol 213 (*) 0 - 200 mg/dL   Triglycerides 98  <150 mg/dL   HDL 47  >39 mg/dL   Total CHOL/HDL Ratio 4.5     VLDL 20  0 - 40 mg/dL   LDL Cholesterol 146 (*) 0 - 99 mg/dL  CK      Result Value Ref Range   Total CK 65  7 - 232 U/L  POCT GLYCOSYLATED HEMOGLOBIN (HGB A1C)      Result Value Ref Range   Hemoglobin A1C 10.3         Assessment & Plan:   1. Essential hypertension, benign   2. Pure hypercholesterolemia   3. Type II or unspecified type diabetes mellitus without mention of complication, uncontrolled   4. Acute sinusitis   5. Allergic rhinitis   6. Hearing loss   7. Generalized anxiety disorder    1. HTN: moderately controlled; if remains elevated, add low dose HCTZ 12.45m daily. 2.  Hypercholesterolemia:  Uncontrolled; obtain labs; continue current medications. 3.  DMII: uncontrolled; stop Glimepiride; start Novolog 5-15 qac; continue Lantus 76 units daily; continue Metformin bid.   4.  Acute sinusitis: New. Rx for Amoxicillin, Flonase. 5.  Allergic Rhinitis: uncontrolled; continue Allegra; rx for Flonase to add. 6.  Acute stress reaction: New. Coping well with family stressors; continue Prozac. 7. Anxiety with depression:  Stable; continue Prozac current dose. 8. Hearing loss: persistent; recommend hearing protection regularly; treat sinus infection and reevaluate.   Meds ordered this encounter  Medications  . insulin aspart (NOVOLOG) cartridge    Sig: Inject 5-15 Units into the skin 3 (three) times daily with meals.    Dispense:  15 mL    Refill:  11  . amoxicillin (AMOXIL) 500 MG capsule    Sig: Take 2 capsules (1,000 mg total) by mouth 2 (two) times daily.    Dispense:  40 capsule    Refill:  0  . cyclobenzaprine (FLEXERIL) 5 MG tablet    Sig: Take 1 tablet (5 mg total) by mouth at bedtime.    Dispense:  30 tablet    Refill:  5  . FLUoxetine (PROZAC) 20 MG tablet     Sig: Take 1 tablet (20 mg total) by mouth daily.    Dispense:  30 tablet    Refill:  11  . fluticasone (FLONASE) 50 MCG/ACT nasal spray  Sig: Place 2 sprays into both nostrils daily.    Dispense:  16 g    Refill:  11  . insulin glargine (LANTUS) 100 UNIT/ML injection    Sig: Inject 0.76 mLs (76 Units total) into the skin at bedtime.    Dispense:  20 mL    Refill:  11  . metFORMIN (GLUCOPHAGE) 1000 MG tablet    Sig: Take 1 tablet (1,000 mg total) by mouth 2 (two) times daily with a meal.    Dispense:  60 tablet    Refill:  11  . ramipril (ALTACE) 10 MG capsule    Sig: Take 1 capsule (10 mg total) by mouth daily.    Dispense:  30 capsule    Refill:  11  . simvastatin (ZOCOR) 40 MG tablet    Sig: Take 1 tablet (40 mg total) by mouth daily.    Dispense:  30 tablet    Refill:  11    Return in about 3 months (around 12/17/2013) for recheck diabetes, high blood pressure, high cholesterol.   I personally performed the services described in this documentation, which was scribed in my presence.  The recorded information has been reviewed and is accurate.  [name], M.D.  [facility] [address] [place], [place]  [address] [phone] phone [phone] fax

## 2013-09-18 ENCOUNTER — Encounter: Payer: Self-pay | Admitting: Family Medicine

## 2013-11-14 ENCOUNTER — Other Ambulatory Visit: Payer: Self-pay | Admitting: Family Medicine

## 2013-12-17 ENCOUNTER — Ambulatory Visit: Payer: BC Managed Care – PPO | Admitting: Family Medicine

## 2013-12-18 ENCOUNTER — Ambulatory Visit: Payer: Self-pay | Admitting: Family Medicine

## 2013-12-30 ENCOUNTER — Ambulatory Visit (INDEPENDENT_AMBULATORY_CARE_PROVIDER_SITE_OTHER): Payer: BC Managed Care – PPO | Admitting: Family Medicine

## 2013-12-30 ENCOUNTER — Encounter: Payer: Self-pay | Admitting: Family Medicine

## 2013-12-30 VITALS — BP 149/78 | HR 80 | Temp 98.0°F | Resp 16 | Ht 73.0 in | Wt 315.4 lb

## 2013-12-30 DIAGNOSIS — E669 Obesity, unspecified: Secondary | ICD-10-CM

## 2013-12-30 DIAGNOSIS — E7800 Pure hypercholesterolemia, unspecified: Secondary | ICD-10-CM

## 2013-12-30 DIAGNOSIS — IMO0001 Reserved for inherently not codable concepts without codable children: Secondary | ICD-10-CM

## 2013-12-30 DIAGNOSIS — F411 Generalized anxiety disorder: Secondary | ICD-10-CM

## 2013-12-30 DIAGNOSIS — I10 Essential (primary) hypertension: Secondary | ICD-10-CM

## 2013-12-30 DIAGNOSIS — E1165 Type 2 diabetes mellitus with hyperglycemia: Principal | ICD-10-CM

## 2013-12-30 LAB — LIPID PANEL
Cholesterol: 166 mg/dL (ref 0–200)
HDL: 44 mg/dL (ref >39)
LDL Cholesterol: 105 mg/dL — ABNORMAL HIGH (ref 0–99)
TRIGLYCERIDES: 84 mg/dL (ref <150)
Total CHOL/HDL Ratio: 3.8 Ratio
VLDL: 17 mg/dL (ref 0–40)

## 2013-12-30 LAB — CBC WITH DIFFERENTIAL/PLATELET
BASOS ABS: 0.1 10*3/uL (ref 0.0–0.1)
Basophils Relative: 2 % — ABNORMAL HIGH (ref 0–1)
EOS ABS: 0.1 10*3/uL (ref 0.0–0.7)
Eosinophils Relative: 2 % (ref 0–5)
HCT: 38.4 % — ABNORMAL LOW (ref 39.0–52.0)
Hemoglobin: 13 g/dL (ref 13.0–17.0)
Lymphocytes Relative: 42 % (ref 12–46)
Lymphs Abs: 2.7 10*3/uL (ref 0.7–4.0)
MCH: 28.6 pg (ref 26.0–34.0)
MCHC: 33.9 g/dL (ref 30.0–36.0)
MCV: 84.6 fL (ref 78.0–100.0)
Monocytes Absolute: 0.4 10*3/uL (ref 0.1–1.0)
Monocytes Relative: 7 % (ref 3–12)
NEUTROS PCT: 47 % (ref 43–77)
Neutro Abs: 3 10*3/uL (ref 1.7–7.7)
PLATELETS: 310 10*3/uL (ref 150–400)
RBC: 4.54 MIL/uL (ref 4.22–5.81)
RDW: 13.8 % (ref 11.5–15.5)
WBC: 6.4 10*3/uL (ref 4.0–10.5)

## 2013-12-30 LAB — COMPREHENSIVE METABOLIC PANEL
ALBUMIN: 4.2 g/dL (ref 3.5–5.2)
ALK PHOS: 72 U/L (ref 39–117)
ALT: 10 U/L (ref 0–53)
AST: 12 U/L (ref 0–37)
BUN: 11 mg/dL (ref 6–23)
CALCIUM: 9.2 mg/dL (ref 8.4–10.5)
CHLORIDE: 101 meq/L (ref 96–112)
CO2: 29 meq/L (ref 19–32)
Creat: 0.85 mg/dL (ref 0.50–1.35)
GLUCOSE: 246 mg/dL — AB (ref 70–99)
POTASSIUM: 4.4 meq/L (ref 3.5–5.3)
Sodium: 139 mEq/L (ref 135–145)
Total Bilirubin: 0.6 mg/dL (ref 0.2–1.2)
Total Protein: 7.5 g/dL (ref 6.0–8.3)

## 2013-12-30 LAB — CK: Total CK: 129 U/L (ref 7–232)

## 2013-12-30 LAB — POCT GLYCOSYLATED HEMOGLOBIN (HGB A1C): Hemoglobin A1C: 11.9

## 2013-12-30 MED ORDER — INSULIN ASPART 100 UNIT/ML CARTRIDGE (PENFILL): 5.0000 [IU] | Freq: Three times a day (TID) | SUBCUTANEOUS | Status: DC

## 2013-12-30 NOTE — Progress Notes (Signed)
Subjective:    Patient ID: [name], male    DOB: 04/03/1961, 53 y.o.   MRN: [id number]  12/30/2013  Follow-up, Diabetes, Hyperlipidemia, Hypertension and Depression   HPI This 53 y.o. male presents for three month follow-up on the following:  1. HTN: three month follow-up; no changes to management made at last visit but readings borderline elevated in 140s on three recent visits.   Checking BP at work sporadically and runs fairly normal.  Denies CP/palp/SOB/leg swelling.  Patient reports good compliance with medication, good tolerance to medication, and good symptom control.    2. Hypercholesterolemia: Uncontrolled at last visit due to non-compliance with statin.  Patient reports good compliance with medication, good tolerance to medication, and good symptom control.   Denies HA/dizziness/focal weakness/paresthesias.  3. DMII: uncontrolled at last visit; management changes made at last visit included stopping Glimepiride; starting Novolog 5-15 qac; continue Lantus 76 units daily; continue Metformin bid.  Did not get Novolog filled after last visit.  Taking Glimeperide bid currently.  Lantus 76 units every morning.  Checking sugars at home; checking once per day. Fastings are "good".  Post-prandials run high which is chronic pattern.  Evening sugars 150-225.  Nocturia x 1.  Drinking more water.  No new n/t in legs/feet.  No burning in feet.   4. Acute sinusitis: resolved after last visit.  Rx for Amoxicillin, Flonase provided.   5. Allergic Rhinitis: uncontrolled at last visit; advised to continue Allegra; rx for Flonase added; allergies improved; using Allegra and Flonase PRN.  6. Acute stress reaction: Coping well with family stressors; continued Prozac at same dose at last visit.  Father passed October 30, 2013.  Coping well with loss.  Father with Parkinson's disease and had really declined six months prior to death; had lost down to 139 pounds due to dysphagia.   Unable to eat solids  for last six weeks.  Father was miserable prior to death.  7. Anxiety with depression: Stable; continued Prozac at same dose at last visit.  Feels that mood is stable.  8. Hearing loss: persistent; recommend hearing protection regularly; treat sinus infection and reevaluate in future.    Review of Systems  Constitutional: Negative for fever, chills, diaphoresis, activity change, appetite change and fatigue.  Respiratory: Negative for cough and shortness of breath.   Cardiovascular: Negative for chest pain, palpitations and leg swelling.  Gastrointestinal: Negative for nausea, vomiting, abdominal pain and diarrhea.  Endocrine: Negative for cold intolerance, heat intolerance, polydipsia, polyphagia and polyuria.  Skin: Negative for color change, rash and wound.  Neurological: Negative for dizziness, tremors, seizures, syncope, facial asymmetry, speech difficulty, weakness, light-headedness, numbness and headaches.  Psychiatric/Behavioral: Negative for sleep disturbance and dysphoric mood. The patient is not nervous/anxious.     Past Medical History  Diagnosis Date  . Diabetes mellitus   . Hyperlipidemia   . Hypertension   . Impotence of organic origin   . Obesity, unspecified   . Allergic rhinitis, cause unspecified   . Other chronic allergic conjunctivitis   . Obstructive sleep apnea (adult) (pediatric)   . Degeneration of intervertebral disc, site unspecified   . Personal history of colonic polyps    Past Surgical History  Procedure Laterality Date  . Laparoscopic gastric banding  09/15/09  . Back surgery      x3  . Quadriceps tendon repair      left  . Quadriceps repair      left  . Rotator cuff repair  right  . Appendectomy    . Tonsillectomy and adenoidectomy  1960  . Arthroscopy of knee  1980  . Colonoscopy  07/11/2009    single polyp; repeat in 5 years.  [name].  . Allergy consult  07/11/2010    SOB, itching, facial tingling, hypotension.  Allergy testing: no food  allergies; +reaction to tree pollen, grass pollen, dust mites, mold.  Avoid diclofenac and similar products.  [name].  [name] consult  07/11/2010    CT chest in ED (gastric thickening).  [name].  No EGD warranted due to lack of symptoms.  . Sleep study  07/11/2010    repeat sleep study due to weight loss.  Severe OSA; CPAP titration to 8 cm.    . Admission  01/2009    [facility].  Syncopal event with nausea, diaphoresis, hypertension.  D-dimer elevated at 2.54 with negative CT chest. and VQ scan.  Cardiac enzymes negative x 3.  Labs normal.  Cardiology consult---cardiolite abn; cath, 2D-echo, holter monitor unrevealing.  SE H&V.  Marland Kitchen Spine surgery      Allergies  Allergen Reactions  . Voltaren [Diclofenac Sodium] Anaphylaxis  . Diclofenac Sodium Itching    SOB, Hypotension    Current Outpatient Prescriptions  Medication Sig Dispense Refill  . aspirin 81 MG tablet Take 81 mg by mouth daily.      . B-D INS SYR ULTRAFINE 1CC/30G 30G X 1/2" 1 ML MISC USE TO ADMINISTER INSULIN THREE (3) TIMES DAILY AS DIRECTED  100 each  2  . B-D INS SYR ULTRAFINE 1CC/30G 30G X 1/2" 1 ML MISC USE TO ADMINISTER INSULIN THREE (3) TIMES DAILY AS DIRECTED  100 each  2  . CALCIUM PO Take 1,500 mg by mouth daily.      . cyclobenzaprine (FLEXERIL) 5 MG tablet Take 1 tablet (5 mg total) by mouth at bedtime.  30 tablet  5  . FLUoxetine (PROZAC) 20 MG tablet Take 1 tablet (20 mg total) by mouth daily.  30 tablet  11  . fluticasone (FLONASE) 50 MCG/ACT nasal spray Place 2 sprays into both nostrils daily.  16 g  11  . glimepiride (AMARYL) 4 MG tablet Take 1 tablet (4 mg total) by mouth 2 (two) times daily.  60 tablet  11  . insulin aspart (NOVOLOG) cartridge Inject 5-15 Units into the skin 3 (three) times daily with meals.  15 mL  11  . insulin glargine (LANTUS) 100 UNIT/ML injection Inject 0.76 mLs (76 Units total) into the skin at bedtime.  20 mL  11  . LANTUS 100 UNIT/ML injection INJECT 70 UNITS (0.7ML) INTO THE SKIN EVERY  NIGHT AT BEDTIME AS DIRECTED  30 mL  9  . metFORMIN (GLUCOPHAGE) 1000 MG tablet Take 1 tablet (1,000 mg total) by mouth 2 (two) times daily with a meal.  60 tablet  11  . Multiple Vitamin (MULTIVITAMIN) capsule Take 1 capsule by mouth daily.      . ramipril (ALTACE) 10 MG capsule Take 1 capsule (10 mg total) by mouth daily.  30 capsule  11  . sildenafil (VIAGRA) 100 MG tablet Take 100 mg by mouth daily as needed.      . simvastatin (ZOCOR) 40 MG tablet Take 1 tablet (40 mg total) by mouth daily.  30 tablet  11   No current facility-administered medications for this visit.   History   Social History  . Marital Status: Married    Spouse Name: N/A    Number of Children: 3  .  Years of Education: N/A   Occupational History  . [employer]     Supervisor x 23 yrs   Social History Main Topics  . Smoking status: Former Smoker    Types: Cigarettes  . Smokeless tobacco: Never Used     Comment: quit 30 years ago  . Alcohol Use: Yes     Comment: occasional Beer on ce every two weeks on the weekends  . Drug Use: No  . Sexual Activity: Yes   Other Topics Concern  . Not on file   Social History Narrative   Married x 26 years, happy.       Children: 3 children (27, 27, 24); 3 grandchildren; 1 gg.       Lives: with wife, dog.      Employment: employed.      Tobacco: never      Alcohol: beer twice per month on weekends.      Drugs: none      Exercise: walking three days per week.      Seatbelt: 100% of time      Guns: loaded and secured/locked.      Sexual activity: sexually active with wife.      Sunscreen: yes.   Family History  Problem Relation Age of Onset  . Cancer Father     skin  . Heart disease Father [id number]    AMI first age 55; stents.  . Diabetes Father   . Hypertension Father   . Hyperlipidemia Father   . Peripheral Artery Disease Father   . Parkinson's disease Father   . Cancer Maternal Uncle     lymphoma  . Cancer Paternal Grandfather     skin  .  Diabetes Mother   . Depression Sister   . Diabetes Sister         Objective:    BP 149/78  Pulse 80  Temp(Src) 98 F (36.7 C) (Oral)  Resp 16  Ht 6\' 1"  (1.854 m)  Wt 315 lb 6.4 oz (143.065 kg)  BMI 41.62 kg/m2  SpO2 98% Physical Exam  Nursing note and vitals reviewed. Constitutional: He is oriented to person, place, and time. He appears well-developed and well-nourished. No distress.  HENT:  Head: Normocephalic and atraumatic.  Right Ear: External ear normal.  Left Ear: External ear normal.  Nose: Nose normal.  Mouth/Throat: Oropharynx is clear and moist.  Eyes: Conjunctivae and EOM are normal. Pupils are equal, round, and reactive to light.  Neck: Normal range of motion. Neck supple. Carotid bruit is not present. No thyromegaly present.  Cardiovascular: Normal rate, regular rhythm, normal heart sounds and intact distal pulses.  Exam reveals no gallop and no friction rub.   No murmur heard. Pulmonary/Chest: Effort normal and breath sounds normal. He has no wheezes. He has no rales.  Abdominal: Soft. Bowel sounds are normal. He exhibits no distension and no mass. There is no tenderness. There is no rebound and no guarding.  Lymphadenopathy:    He has no cervical adenopathy.  Neurological: He is alert and oriented to person, place, and time. No cranial nerve deficit.  Skin: Skin is warm and dry. No rash noted. He is not diaphoretic.  Psychiatric: He has a normal mood and affect. His behavior is normal.   Results for orders placed in visit on 12/30/13  POCT GLYCOSYLATED HEMOGLOBIN (HGB A1C)      Result Value Ref Range   Hemoglobin A1C 11.9         Assessment &  Plan:  Type II or unspecified type diabetes mellitus without mention of complication, uncontrolled - Plan: POCT glycosylated hemoglobin (Hb A1C), CBC with Differential, Comprehensive metabolic panel, Lipid panel, CK  Essential hypertension, benign - Plan: POCT glycosylated hemoglobin (Hb A1C), CBC with  Differential, Comprehensive metabolic panel, Lipid panel, CK  Pure hypercholesterolemia - Plan: POCT glycosylated hemoglobin (Hb A1C), CBC with Differential, Comprehensive metabolic panel, Lipid panel, CK  Obesity, unspecified  Generalized anxiety disorder  1. DMII:  Uncontrolled; to start Novolog and stop Glimepiride.  Non-compliance with these medication changes at last visit.  Obtain labs. 2.  HTN: moderately controlled; if systolic remains above 140 at next visit, will add HCTZ 12.5mg  daily; pt advised. Obtain labs. 3.  Hyperlipidemia: uncontrolled at last visit due to non-compliance with statin; now with improved compliance; obtain labs. 4.  Obesity: weight down 2 pounds since last visit; encourage further weight loss.  S/p lab band in past. 5.  Generalized anxiety disorder: stable; grieving the loss of father in past two months.  Coping well.   Meds ordered this encounter  Medications  . insulin aspart (NOVOLOG) cartridge    Sig: Inject 5-15 Units into the skin 3 (three) times daily with meals.    Dispense:  15 mL    Refill:  11    Return in about 3 months (around 04/01/2014) for complete physical examiniation.     Nilda Simmer, M.D.  [facility] [address] [place], [place]  [address] [phone] phone [phone] fax

## 2013-12-31 ENCOUNTER — Encounter: Payer: Self-pay | Admitting: Family Medicine

## 2014-03-01 ENCOUNTER — Other Ambulatory Visit: Payer: Self-pay | Admitting: Physician Assistant

## 2014-03-01 ENCOUNTER — Emergency Department (HOSPITAL_COMMUNITY)
Admission: EM | Admit: 2014-03-01 | Discharge: 2014-03-01 | Disposition: A | Discharge status: Home / Self Care | Payer: BC Managed Care – PPO | Attending: Emergency Medicine | Admitting: Emergency Medicine

## 2014-03-01 ENCOUNTER — Encounter (HOSPITAL_COMMUNITY): Payer: Self-pay | Admitting: Emergency Medicine

## 2014-03-01 ENCOUNTER — Ambulatory Visit (INDEPENDENT_AMBULATORY_CARE_PROVIDER_SITE_OTHER): Payer: BC Managed Care – PPO | Admitting: Family Medicine

## 2014-03-01 ENCOUNTER — Ambulatory Visit (INDEPENDENT_AMBULATORY_CARE_PROVIDER_SITE_OTHER): Payer: BC Managed Care – PPO

## 2014-03-01 VITALS — BP 128/56 | HR 95 | Temp 98.1°F | Resp 24 | Ht 73.0 in | Wt 313.0 lb

## 2014-03-01 DIAGNOSIS — E119 Type 2 diabetes mellitus without complications: Secondary | ICD-10-CM | POA: Diagnosis not present

## 2014-03-01 DIAGNOSIS — Z860100 Personal history of colon polyps, unspecified: Secondary | ICD-10-CM | POA: Insufficient documentation

## 2014-03-01 DIAGNOSIS — Z79899 Other long term (current) drug therapy: Secondary | ICD-10-CM | POA: Insufficient documentation

## 2014-03-01 DIAGNOSIS — R197 Diarrhea, unspecified: Secondary | ICD-10-CM | POA: Diagnosis not present

## 2014-03-01 DIAGNOSIS — R52 Pain, unspecified: Secondary | ICD-10-CM | POA: Diagnosis not present

## 2014-03-01 DIAGNOSIS — Z7982 Long term (current) use of aspirin: Secondary | ICD-10-CM | POA: Diagnosis not present

## 2014-03-01 DIAGNOSIS — Z87891 Personal history of nicotine dependence: Secondary | ICD-10-CM | POA: Diagnosis not present

## 2014-03-01 DIAGNOSIS — R5381 Other malaise: Secondary | ICD-10-CM

## 2014-03-01 DIAGNOSIS — R0602 Shortness of breath: Secondary | ICD-10-CM

## 2014-03-01 DIAGNOSIS — E118 Type 2 diabetes mellitus with unspecified complications: Secondary | ICD-10-CM

## 2014-03-01 DIAGNOSIS — N529 Male erectile dysfunction, unspecified: Secondary | ICD-10-CM | POA: Insufficient documentation

## 2014-03-01 DIAGNOSIS — Z8739 Personal history of other diseases of the musculoskeletal system and connective tissue: Secondary | ICD-10-CM | POA: Diagnosis not present

## 2014-03-01 DIAGNOSIS — R0989 Other specified symptoms and signs involving the circulatory and respiratory systems: Secondary | ICD-10-CM

## 2014-03-01 DIAGNOSIS — Z794 Long term (current) use of insulin: Secondary | ICD-10-CM | POA: Diagnosis not present

## 2014-03-01 DIAGNOSIS — E669 Obesity, unspecified: Secondary | ICD-10-CM | POA: Diagnosis not present

## 2014-03-01 DIAGNOSIS — E785 Hyperlipidemia, unspecified: Secondary | ICD-10-CM | POA: Insufficient documentation

## 2014-03-01 DIAGNOSIS — I10 Essential (primary) hypertension: Secondary | ICD-10-CM

## 2014-03-01 DIAGNOSIS — R0609 Other forms of dyspnea: Secondary | ICD-10-CM

## 2014-03-01 DIAGNOSIS — R61 Generalized hyperhidrosis: Secondary | ICD-10-CM

## 2014-03-01 DIAGNOSIS — R509 Fever, unspecified: Secondary | ICD-10-CM | POA: Insufficient documentation

## 2014-03-01 DIAGNOSIS — R5383 Other fatigue: Principal | ICD-10-CM

## 2014-03-01 DIAGNOSIS — R0600 Dyspnea, unspecified: Secondary | ICD-10-CM

## 2014-03-01 LAB — COMPREHENSIVE METABOLIC PANEL
ALT: 12 U/L (ref 0–53)
AST: 14 U/L (ref 0–37)
Albumin: 4 g/dL (ref 3.5–5.2)
Alkaline Phosphatase: 75 U/L (ref 39–117)
BUN: 21 mg/dL (ref 6–23)
CO2: 25 mEq/L (ref 19–32)
Calcium: 8.7 mg/dL (ref 8.4–10.5)
Chloride: 101 mEq/L (ref 96–112)
Creat: 0.87 mg/dL (ref 0.50–1.35)
Glucose, Bld: 250 mg/dL — ABNORMAL HIGH (ref 70–99)
Potassium: 4.4 mEq/L (ref 3.5–5.3)
Sodium: 136 mEq/L (ref 135–145)
Total Bilirubin: 0.6 mg/dL (ref 0.2–1.2)
Total Protein: 7.1 g/dL (ref 6.0–8.3)

## 2014-03-01 LAB — T4, FREE: Free T4: 0.91 ng/dL (ref 0.80–1.80)

## 2014-03-01 LAB — POCT CBC
Granulocyte percent: 84.2 %G — AB (ref 37–80)
HCT, POC: 44.2 % (ref 43.5–53.7)
Hemoglobin: 14.7 g/dL (ref 14.1–18.1)
Lymph, poc: 0.8 (ref 0.6–3.4)
MCH, POC: 29.5 pg (ref 27–31.2)
MCHC: 33.3 g/dL (ref 31.8–35.4)
MCV: 88.7 fL (ref 80–97)
MID (cbc): 0.2 (ref 0–0.9)
MPV: 7.5 fL (ref 0–99.8)
POC Granulocyte: 5.2 (ref 2–6.9)
POC LYMPH PERCENT: 13.3 %L (ref 10–50)
POC MID %: 2.5 %M (ref 0–12)
Platelet Count, POC: 282 10*3/uL (ref 142–424)
RBC: 4.98 M/uL (ref 4.69–6.13)
RDW, POC: 13.7 %
WBC: 6.2 10*3/uL (ref 4.6–10.2)

## 2014-03-01 LAB — PRO B NATRIURETIC PEPTIDE: Pro B Natriuretic peptide (BNP): 94.3 pg/mL (ref 0–125)

## 2014-03-01 LAB — I-STAT TROPONIN, ED: TROPONIN I, POC: 0 ng/mL (ref 0.00–0.08)

## 2014-03-01 LAB — BASIC METABOLIC PANEL
Anion gap: 15 (ref 5–15)
BUN: 22 mg/dL (ref 6–23)
CHLORIDE: 99 meq/L (ref 96–112)
CO2: 23 mEq/L (ref 19–32)
Calcium: 8.8 mg/dL (ref 8.4–10.5)
Creatinine, Ser: 0.72 mg/dL (ref 0.50–1.35)
GFR calc Af Amer: >90 mL/min (ref >90)
GLUCOSE: 239 mg/dL — AB (ref 70–99)
POTASSIUM: 3.9 meq/L (ref 3.7–5.3)
Sodium: 137 mEq/L (ref 137–147)

## 2014-03-01 LAB — POCT URINALYSIS DIPSTICK
Bilirubin, UA: NEGATIVE
Blood, UA: NEGATIVE
Glucose, UA: 500
Ketones, UA: NEGATIVE
Leukocytes, UA: NEGATIVE
Nitrite, UA: NEGATIVE
Protein, UA: 30
Spec Grav, UA: 1.025
Urobilinogen, UA: 0.2
pH, UA: 5

## 2014-03-01 LAB — CBC
HEMATOCRIT: 40 % (ref 39.0–52.0)
Hemoglobin: 13.2 g/dL (ref 13.0–17.0)
MCH: 28.3 pg (ref 26.0–34.0)
MCHC: 33 g/dL (ref 30.0–36.0)
MCV: 85.7 fL (ref 78.0–100.0)
Platelets: 247 10*3/uL (ref 150–400)
RBC: 4.67 MIL/uL (ref 4.22–5.81)
RDW: 13.1 % (ref 11.5–15.5)
WBC: 4.8 10*3/uL (ref 4.0–10.5)

## 2014-03-01 LAB — POCT GLYCOSYLATED HEMOGLOBIN (HGB A1C): Hemoglobin A1C: 11.6

## 2014-03-01 LAB — GLUCOSE, POCT (MANUAL RESULT ENTRY): POC Glucose: 254 mg/dl — AB (ref 70–99)

## 2014-03-01 LAB — TSH: TSH: 2.311 u[IU]/mL (ref 0.350–4.500)

## 2014-03-01 MED ORDER — ASPIRIN 81 MG PO CHEW
324.0000 mg | CHEWABLE_TABLET | Freq: Once | ORAL | Status: AC
  Administered 2014-03-01: 324 mg via ORAL
  Filled 2014-03-01: qty 4

## 2014-03-01 MED ORDER — NITROGLYCERIN 0.4 MG SL SUBL
0.4000 mg | SUBLINGUAL_TABLET | SUBLINGUAL | Status: DC | PRN
  Filled 2014-03-01: qty 1

## 2014-03-01 NOTE — ED Provider Notes (Signed)
CSN: [id number]     Arrival date & time 03/01/14  1002 History   First MD Initiated Contact with Patient 03/01/14 1100     Chief Complaint  Patient presents with  . Shortness of Breath  . Fever  . Generalized Body Aches     (Consider location/radiation/quality/duration/timing/severity/associated sxs/prior Treatment) Patient is a 53 y.o. male presenting with shortness of breath and fever.  Shortness of Breath Severity:  Moderate Onset quality:  Gradual Duration:  6 months Timing:  Intermittent Progression:  Worsening Context: activity   Relieved by:  Rest Worsened by:  Nothing tried Ineffective treatments:  Diuretics Associated symptoms: diaphoresis and fever   Associated symptoms: no chest pain   Fever Associated symptoms: no chest pain     Past Medical History  Diagnosis Date  . Diabetes mellitus   . Hyperlipidemia   . Hypertension   . Impotence of organic origin   . Obesity, unspecified   . Allergic rhinitis, cause unspecified   . Other chronic allergic conjunctivitis   . Obstructive sleep apnea (adult) (pediatric)   . Degeneration of intervertebral disc, site unspecified   . Personal history of colonic polyps    Past Surgical History  Procedure Laterality Date  . Laparoscopic gastric banding  09/15/09  . Back surgery      x3  . Quadriceps tendon repair      left  . Quadriceps repair      left  . Rotator cuff repair      right  . Appendectomy    . Tonsillectomy and adenoidectomy  1960  . Arthroscopy of knee  1980  . Colonoscopy  07/11/2009    single polyp; repeat in 5 years.  [name].  . Allergy consult  07/11/2010    SOB, itching, facial tingling, hypotension.  Allergy testing: no food allergies; +reaction to tree pollen, grass pollen, dust mites, mold.  Avoid diclofenac and similar products.  [name].  [name] consult  07/11/2010    CT chest in ED (gastric thickening).  [name].  No EGD warranted due to lack of symptoms.  . Sleep study  07/11/2010    repeat sleep  study due to weight loss.  Severe OSA; CPAP titration to 8 cm.    . Admission  01/2009    [facility].  Syncopal event with nausea, diaphoresis, hypertension.  D-dimer elevated at 2.54 with negative CT chest. and VQ scan.  Cardiac enzymes negative x 3.  Labs normal.  Cardiology consult---cardiolite abn; cath, 2D-echo, holter monitor unrevealing.  SE H&V.  Marland Kitchen Spine surgery     Family History  Problem Relation Age of Onset  . Cancer Father     skin  . Heart disease Father 40    AMI first age 63; stents.  . Diabetes Father   . Hypertension Father   . Hyperlipidemia Father   . Peripheral Artery Disease Father   . Parkinson's disease Father   . Cancer Maternal Uncle     lymphoma  . Cancer Paternal Grandfather     skin  . Diabetes Mother   . Depression Sister   . Diabetes Sister    History  Substance Use Topics  . Smoking status: Former Smoker    Types: Cigarettes  . Smokeless tobacco: Never Used     Comment: quit 30 years ago  . Alcohol Use: Yes     Comment: occasional Beer on ce every two weeks on the weekends    Review of Systems  Constitutional: Positive for fever and  diaphoresis.  Respiratory: Positive for shortness of breath.   Cardiovascular: Negative for chest pain.  All other systems reviewed and are negative.     Allergies  Diclofenac sodium and Voltaren  Home Medications   Prior to Admission medications   Medication Sig Start Date End Date Taking? Authorizing Provider  aspirin 81 MG tablet Take 81 mg by mouth daily.   Yes Historical Provider, MD  Calcium-Magnesium-Vitamin D (CALCIUM 500 PO) Take 500 mg by mouth 3 (three) times daily.   Yes Historical Provider, MD  cyclobenzaprine (FLEXERIL) 5 MG tablet Take 5 mg by mouth at bedtime as needed for muscle spasms.   Yes Historical Provider, MD  FLUoxetine (PROZAC) 20 MG tablet Take 1 tablet (20 mg total) by mouth daily. 09/16/13  Yes [name], MD  glimepiride (AMARYL) 4 MG tablet Take 1 tablet (4 mg total) by  mouth 2 (two) times daily. 04/01/13  Yes [name], MD  insulin glargine (LANTUS) 100 UNIT/ML injection Inject 76 Units into the skin every morning.   Yes Historical Provider, MD  Insulin Syringe-Needle U-100 (B-D INS SYR ULTRAFINE 1CC/30G) 30G X 1/2" 1 ML MISC 1 each by Does not apply route See admin instructions. Use daily with insulin   Yes Historical Provider, MD  metFORMIN (GLUCOPHAGE) 1000 MG tablet Take 1 tablet (1,000 mg total) by mouth 2 (two) times daily with a meal. 09/16/13  Yes [name], MD  Multiple Vitamin (MULTIVITAMIN) capsule Take 1 capsule by mouth daily.   Yes Historical Provider, MD  ramipril (ALTACE) 10 MG capsule Take 1 capsule (10 mg total) by mouth daily. 09/16/13  Yes [name], MD  sildenafil (VIAGRA) 100 MG tablet Take 100 mg by mouth daily as needed for erectile dysfunction.    Yes Historical Provider, MD  simvastatin (ZOCOR) 40 MG tablet Take 1 tablet (40 mg total) by mouth daily. 09/16/13  Yes [name], MD   BP 124/49  Pulse 79  Temp(Src) 98.7 F (37.1 C) (Oral)  Resp 15  Ht  (1.854 m)  Wt 312 lb (141.522 kg)  BMI 41.17 kg/m2  SpO2 96% Physical Exam  Nursing note and vitals reviewed. Constitutional: He is oriented to person, place, and time. He appears well-developed and well-nourished. No distress.  HENT:  Head: Normocephalic and atraumatic.  Mouth/Throat: Oropharynx is clear and moist.  Eyes: Conjunctivae are normal. Pupils are equal, round, and reactive to light. No scleral icterus.  Neck: Neck supple.  Cardiovascular: Normal rate, regular rhythm, normal heart sounds and intact distal pulses.   No murmur heard. Pulmonary/Chest: Effort normal and breath sounds normal. No stridor. No respiratory distress. He has no wheezes. He has no rales.  Abdominal: Soft. He exhibits no distension. There is no tenderness. There is no rebound and no guarding.  Musculoskeletal: Normal range of motion. He exhibits no edema.  Neurological: He is alert  and oriented to person, place, and time.  Skin: Skin is warm and dry. No rash noted.  Psychiatric: He has a normal mood and affect. His behavior is normal.    ED Course  Procedures (including critical care time) Labs Review Labs Reviewed  BASIC METABOLIC PANEL - Abnormal; Notable for the following:    Glucose, Bld 239 (*)    All other components within normal limits  CBC  PRO B NATRIURETIC PEPTIDE  I-STAT TROPOININ, ED    Imaging Review Dg Chest 2 View  03/01/2014   CLINICAL DATA:  Dyspnea.  EXAM: CHEST  2 VIEW  COMPARISON:  October 31, 2010.  FINDINGS: The heart size and mediastinal contours are within normal limits. Both lungs are clear. No pneumothorax or pleural effusion is noted. The visualized skeletal structures are unremarkable.  IMPRESSION: No acute cardiopulmonary abnormality seen.   Electronically Signed   By: Roque Lias M.D.   On: 03/01/2014 10:09  All radiology studies independently viewed by me.      EKG Interpretation   Date/Time:  Saturday March 01 2014 10:13:18 EDT Ventricular Rate:  98 PR Interval:  130 QRS Duration: 90 QT Interval:  350 QTC Calculation: 446 R Axis:   29 Text Interpretation:  Normal sinus rhythm Normal ECG No significant change  was found Confirmed by [facility]  MD, TREY ([id number]) on 03/01/2014 12:34:29 PM      MDM   Final diagnoses:  Diarrhea  Shortness of breath    53 yo male who presents with several months of intermittent dyspnea and diaphoresis on exertion and fatigue.  He was evaluated at urgent care and advised to come to the nursing department for further cardiac workup. It appears that his reason for presenting to the urgent care was a self-limited vomiting and diarrheal illness from which he is now asymptomatic. From a cardiac standpoint, his EKG was unremarkable, troponin was negative showed no acute cardiopulmonary abnormality. Dr. Johney Frame (cardiology) evaluated the patient in the emergency department and felt that he was  appropriate for further outpatient workup. Patient and family agreeable with this plan and discharged in good condition.  Candyce Churn III, MD 03/02/14 [phone]

## 2014-03-01 NOTE — ED Notes (Addendum)
Pt here for fever, sob, diaphoresis when he gets hot, sts symptoms have been going on for several months but now sts fever, not feeling well and joint pain last night made it worse. Pt sent from dr lowenstein office for DOE

## 2014-03-01 NOTE — ED Notes (Signed)
Dr. Wofford at bedside 
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
## 2014-03-01 NOTE — ED Notes (Signed)
Cardiololgy at the bedside.

## 2014-03-06 ENCOUNTER — Ambulatory Visit (HOSPITAL_COMMUNITY): Payer: BC Managed Care – PPO | Attending: Cardiovascular Disease | Admitting: Radiology

## 2014-03-06 VITALS — BP 134/71 | Ht 73.0 in | Wt 313.0 lb

## 2014-03-06 DIAGNOSIS — R9439 Abnormal result of other cardiovascular function study: Secondary | ICD-10-CM | POA: Insufficient documentation

## 2014-03-06 DIAGNOSIS — R0602 Shortness of breath: Secondary | ICD-10-CM | POA: Diagnosis not present

## 2014-03-06 DIAGNOSIS — E109 Type 1 diabetes mellitus without complications: Secondary | ICD-10-CM

## 2014-03-06 MED ORDER — TECHNETIUM TC 99M SESTAMIBI GENERIC - CARDIOLITE
33.0000 | Freq: Once | INTRAVENOUS | Status: AC | PRN
  Administered 2014-03-06: 33 via INTRAVENOUS

## 2014-03-06 NOTE — Progress Notes (Signed)
[facility] SITE 3 NUCLEAR MED [address] [place], [place] [address] [phone]    Cardiology Nuclear Med Study  EISEN ROBENSON is a 53 y.o. male     MRN : [id number]     DOB: February 22, 1961  Procedure Date: 03/06/2014  Nuclear Med Background Indication for Stress Test:  Evaluation for Ischemia, and Patient seen in hospital on 03-01-2014 for SOB, Fever, Fatigue, Enzymes negative History:  2007 MPI: ok per pt, No H/O CAD Cardiac Risk Factors: Family History - CAD, History of Smoking, Hypertension, IDDM, and Lipids  Symptoms:  DOE, Fatigue and SOB   Nuclear Pre-Procedure Caffeine/Decaff Intake:  None> 12hrs NPO After: 7:00pm   Lungs:  clear O2 Sat: 99% on room air. IV 0.9% NS with Angio Cath:  22g  IV Site: R Wrist x 1, tolerated well IV Started by:  Irean Hong, RN  Chest Size (in):  54 Cup Size: n/a  Height:  (1.854 m)  Weight:  313 lb (141.976 kg)  BMI:  Body mass index is 41.3 kg/(m^2). Tech Comments:  Fasting CBG was 214 at 0620 today. No Insulin, Amaryl, or Metformin, this am. Patient took Altace this am.    Nuclear Med Study 1 or 2 day study: 2 day  Stress Test Type:  Stress  Reading MD: N/A  Order Authorizing Provider:  Hillis Range, MD  Resting Radionuclide: Technetium 83m Sestamibi  Resting Radionuclide Dose: 33.0 mCi on 03/11/14  Stress Radionuclide:  Technetium 91m Sestamibi  Stress Radionuclide Dose: 33.0 mCi on 03/06/14          Stress Protocol Rest HR: 77 Stress HR: 155  Rest BP: 134/71 Stress BP: 211/66  Exercise Time (min): 5:30 METS: 7.0   Predicted Max HR: 166 bpm % Max HR: 93.37 bpm Rate Pressure Product: 21308   Dose of Adenosine (mg):  n/a Dose of Lexiscan: n/a mg  Dose of Atropine (mg): n/a Dose of Dobutamine: n/a mcg/kg/min (at max HR)  Stress Test Technologist: Milana Na, EMT-P  Nuclear Technologist:  Doyne Keel, CNMT     Rest Procedure:  Myocardial perfusion imaging was performed at rest 45 minutes following the  intravenous administration of Technetium 58m Sestamibi. Rest ECG: NSR - Normal EKG  Stress Procedure:  The patient exercised on the treadmill utilizing the Bruce Protocol for 5:30 minutes. The patient stopped due to fatigue, sob, and denied any chest pain.  Technetium 33m Sestamibi was injected at peak exercise and myocardial perfusion imaging was performed after a brief delay. Stress ECG: No significant ST segment change suggestive of ischemia.  QPS Raw Data Images:  Normal; no motion artifact; normal heart/lung ratio. Stress Images:  There is decreased uptake in the apex. Rest Images:  There is decreased uptake in the apex. Subtraction (SDS):  No ischemia. Fixed apical defect. Transient Ischemic Dilatation (Normal <1.22):  1.01 Lung/Heart Ratio (Normal <0.45):  0.37  Quantitative Gated Spect Images QGS EDV:  130 ml QGS ESV:  61 ml  Impression Exercise Capacity:  Fair exercise capacity. BP Response:  Hypertensive blood pressure response. Clinical Symptoms:  There is dyspnea. ECG Impression:  Wandering baseline, no significant ST depressions Comparison with Prior Nuclear Study: No previous nuclear study performed  Overall Impression:  Low risk stress nuclear study with fixed apical defect, suggestive of possible small scar..  LV Ejection Fraction: 53%.  LV Wall Motion:  Apical hypokinesis.  Chrystie Nose, MD, [facility] Board Certified in Nuclear Cardiology Attending Cardiologist [facility]

## 2014-03-11 ENCOUNTER — Ambulatory Visit (HOSPITAL_COMMUNITY): Payer: BC Managed Care – PPO | Attending: Cardiovascular Disease

## 2014-03-11 DIAGNOSIS — R0989 Other specified symptoms and signs involving the circulatory and respiratory systems: Secondary | ICD-10-CM

## 2014-03-11 MED ORDER — TECHNETIUM TC 99M SESTAMIBI GENERIC - CARDIOLITE
30.0000 | Freq: Once | INTRAVENOUS | Status: AC | PRN
  Administered 2014-03-11: 30 via INTRAVENOUS

## 2014-03-13 ENCOUNTER — Telehealth: Payer: Self-pay | Admitting: Physician Assistant

## 2014-03-13 NOTE — Telephone Encounter (Signed)
      I spoke to the patient about the results of his myoview which was felt to be low risk but did have an apical defect. Dr. Johney Frame advised him to make an appointment with a NP or PA for further follow up. He understood and agreed to make an appointment over the next couple weeks. He has generally been feeling pretty good.   Cline Crock PA-C  [facility]

## 2014-03-20 ENCOUNTER — Encounter: Payer: Self-pay | Admitting: Cardiology

## 2014-03-20 ENCOUNTER — Encounter: Payer: Self-pay | Admitting: *Deleted

## 2014-03-20 ENCOUNTER — Ambulatory Visit (INDEPENDENT_AMBULATORY_CARE_PROVIDER_SITE_OTHER): Payer: BC Managed Care – PPO | Admitting: Cardiology

## 2014-03-20 VITALS — BP 124/80 | HR 74 | Ht 73.0 in | Wt 314.8 lb

## 2014-03-20 DIAGNOSIS — R0989 Other specified symptoms and signs involving the circulatory and respiratory systems: Secondary | ICD-10-CM

## 2014-03-20 DIAGNOSIS — E119 Type 2 diabetes mellitus without complications: Secondary | ICD-10-CM

## 2014-03-20 DIAGNOSIS — G4733 Obstructive sleep apnea (adult) (pediatric): Secondary | ICD-10-CM | POA: Insufficient documentation

## 2014-03-20 DIAGNOSIS — E785 Hyperlipidemia, unspecified: Secondary | ICD-10-CM

## 2014-03-20 DIAGNOSIS — R0609 Other forms of dyspnea: Secondary | ICD-10-CM

## 2014-03-20 DIAGNOSIS — I10 Essential (primary) hypertension: Secondary | ICD-10-CM

## 2014-03-20 DIAGNOSIS — Z8249 Family history of ischemic heart disease and other diseases of the circulatory system: Secondary | ICD-10-CM

## 2014-03-20 DIAGNOSIS — R0600 Dyspnea, unspecified: Secondary | ICD-10-CM

## 2014-03-20 DIAGNOSIS — R9439 Abnormal result of other cardiovascular function study: Secondary | ICD-10-CM

## 2014-03-20 DIAGNOSIS — E669 Obesity, unspecified: Secondary | ICD-10-CM

## 2014-03-20 DIAGNOSIS — G4730 Sleep apnea, unspecified: Secondary | ICD-10-CM

## 2014-03-20 NOTE — Assessment & Plan Note (Signed)
Apical scar

## 2014-03-20 NOTE — Assessment & Plan Note (Signed)
Reports compliance with C-pap 

## 2014-03-20 NOTE — Patient Instructions (Signed)
Your physician recommends that you continue on your current medications as directed. Please refer to the Current Medication list given to you today.   Your physician has requested that you have an echocardiogram. Echocardiography is a painless test that uses sound waves to create images of your heart. It provides your doctor with information about the size and shape of your heart and how well your heart's chambers and valves are working. This procedure takes approximately one hour. There are no restrictions for this procedure.    Your physician has requested that you have a cardiac catheterization. ON 9/23/ 15 @ 10 AM WITH DR [facility]  Cardiac catheterization is used to diagnose and/or treat various heart conditions. Doctors may recommend this procedure for a number of different reasons. The most common reason is to evaluate chest pain. Chest pain can be a symptom of coronary artery disease (CAD), and cardiac catheterization can show whether plaque is narrowing or blocking your heart's arteries. This procedure is also used to evaluate the valves, as well as measure the blood flow and oxygen levels in different parts of your heart. For further information please visit [email or web address] Please follow instruction sheet, as given.

## 2014-03-20 NOTE — Assessment & Plan Note (Signed)
His father had severe CAD, PVD in his 59's

## 2014-03-20 NOTE — Assessment & Plan Note (Signed)
He has lost 100 lbs, s/p lap banding

## 2014-03-20 NOTE — Assessment & Plan Note (Signed)
Controlled.  

## 2014-03-20 NOTE — Assessment & Plan Note (Signed)
Last LDL 105 June 2015

## 2014-03-20 NOTE — Assessment & Plan Note (Signed)
New last few months- ? Anginal equivalent
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
## 2014-03-26 ENCOUNTER — Ambulatory Visit (HOSPITAL_COMMUNITY): Payer: BC Managed Care – PPO | Attending: Cardiology | Admitting: Radiology

## 2014-03-26 ENCOUNTER — Other Ambulatory Visit (HOSPITAL_COMMUNITY): Payer: BC Managed Care – PPO | Admitting: *Deleted

## 2014-03-26 DIAGNOSIS — R9439 Abnormal result of other cardiovascular function study: Secondary | ICD-10-CM | POA: Diagnosis not present

## 2014-03-26 DIAGNOSIS — R0600 Dyspnea, unspecified: Secondary | ICD-10-CM

## 2014-03-26 MED ORDER — PERFLUTREN LIPID MICROSPHERE
1.0000 mL | Freq: Once | INTRAVENOUS | Status: AC
  Administered 2014-03-26: 5 mL via INTRAVENOUS

## 2014-03-26 NOTE — Progress Notes (Signed)
Echocardiogram performed with [facility].  

## 2014-03-28 ENCOUNTER — Encounter (HOSPITAL_COMMUNITY): Payer: Self-pay | Admitting: Pharmacy Technician

## 2014-03-31 ENCOUNTER — Other Ambulatory Visit: Payer: Self-pay | Admitting: Family Medicine
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
## 2014-04-01 NOTE — Interval H&P Note (Signed)
Cath Lab Visit (complete for each Cath Lab visit)  Clinical Evaluation Leading to the Procedure:   ACS: No.  Non-ACS:    Anginal Classification: CCS II  Anti-ischemic medical therapy: Minimal Therapy (1 class of medications)  Non-Invasive Test Results: Low-risk stress test findings: cardiac mortality <1%/year  Prior CABG: No previous CABG      History and Physical Interval Note:  04/01/2014 6:53 PM  [name]  has presented today for surgery, with the diagnosis of abnormal stress test  The various methods of treatment have been discussed with the patient and family. After consideration of risks, benefits and other options for treatment, the patient has consented to  Procedure(s): LEFT HEART CATHETERIZATION WITH CORONARY ANGIOGRAM (N/A) as a surgical intervention .  The patient's history has been reviewed, patient examined, no change in status, stable for surgery.  I have reviewed the patient's chart and labs.  Questions were answered to the patient's satisfaction.     Lesleigh Noe

## 2014-04-02 ENCOUNTER — Ambulatory Visit (HOSPITAL_COMMUNITY)
Admission: RE | Admit: 2014-04-02 | Discharge: 2014-04-02 | Disposition: A | Discharge status: Home / Self Care | Payer: BC Managed Care – PPO | Source: Ambulatory Visit | Attending: Interventional Cardiology | Admitting: Interventional Cardiology

## 2014-04-02 ENCOUNTER — Encounter (HOSPITAL_COMMUNITY)
Admission: RE | Disposition: A | Discharge status: Home / Self Care | Payer: Self-pay | Source: Ambulatory Visit | Attending: Interventional Cardiology

## 2014-04-02 ENCOUNTER — Encounter: Payer: BC Managed Care – PPO | Admitting: Family Medicine

## 2014-04-02 DIAGNOSIS — I10 Essential (primary) hypertension: Secondary | ICD-10-CM

## 2014-04-02 DIAGNOSIS — I5030 Unspecified diastolic (congestive) heart failure: Secondary | ICD-10-CM | POA: Diagnosis not present

## 2014-04-02 DIAGNOSIS — Z7982 Long term (current) use of aspirin: Secondary | ICD-10-CM | POA: Insufficient documentation

## 2014-04-02 DIAGNOSIS — Z9884 Bariatric surgery status: Secondary | ICD-10-CM | POA: Diagnosis not present

## 2014-04-02 DIAGNOSIS — Z8249 Family history of ischemic heart disease and other diseases of the circulatory system: Secondary | ICD-10-CM | POA: Insufficient documentation

## 2014-04-02 DIAGNOSIS — G4733 Obstructive sleep apnea (adult) (pediatric): Secondary | ICD-10-CM | POA: Insufficient documentation

## 2014-04-02 DIAGNOSIS — I2510 Atherosclerotic heart disease of native coronary artery without angina pectoris: Secondary | ICD-10-CM

## 2014-04-02 DIAGNOSIS — R0789 Other chest pain: Secondary | ICD-10-CM | POA: Diagnosis not present

## 2014-04-02 DIAGNOSIS — E785 Hyperlipidemia, unspecified: Secondary | ICD-10-CM | POA: Diagnosis not present

## 2014-04-02 DIAGNOSIS — E119 Type 2 diabetes mellitus without complications: Secondary | ICD-10-CM | POA: Diagnosis not present

## 2014-04-02 DIAGNOSIS — Z794 Long term (current) use of insulin: Secondary | ICD-10-CM | POA: Diagnosis not present

## 2014-04-02 DIAGNOSIS — Z87891 Personal history of nicotine dependence: Secondary | ICD-10-CM | POA: Diagnosis not present

## 2014-04-02 DIAGNOSIS — R9439 Abnormal result of other cardiovascular function study: Secondary | ICD-10-CM

## 2014-04-02 HISTORY — PX: LEFT HEART CATHETERIZATION WITH CORONARY ANGIOGRAM: SHX5451

## 2014-04-02 LAB — GLUCOSE, CAPILLARY: GLUCOSE-CAPILLARY: 130 mg/dL — AB (ref 70–99)

## 2014-04-02 LAB — CBC
HCT: 38.5 % — ABNORMAL LOW (ref 39.0–52.0)
Hemoglobin: 12.9 g/dL — ABNORMAL LOW (ref 13.0–17.0)
MCH: 29.5 pg (ref 26.0–34.0)
MCHC: 33.5 g/dL (ref 30.0–36.0)
MCV: 87.9 fL (ref 78.0–100.0)
Platelets: 255 10*3/uL (ref 150–400)
RBC: 4.38 MIL/uL (ref 4.22–5.81)
RDW: 12.9 % (ref 11.5–15.5)
WBC: 5.1 10*3/uL (ref 4.0–10.5)

## 2014-04-02 LAB — BASIC METABOLIC PANEL
Anion gap: 8 (ref 5–15)
BUN: 15 mg/dL (ref 6–23)
CO2: 30 mEq/L (ref 19–32)
Calcium: 8.9 mg/dL (ref 8.4–10.5)
Chloride: 100 mEq/L (ref 96–112)
Creatinine, Ser: 0.62 mg/dL (ref 0.50–1.35)
GFR calc Af Amer: >90 mL/min (ref >90)
GFR calc non Af Amer: >90 mL/min (ref >90)
Glucose, Bld: 190 mg/dL — ABNORMAL HIGH (ref 70–99)
Potassium: 3.9 mEq/L (ref 3.7–5.3)
Sodium: 138 mEq/L (ref 137–147)

## 2014-04-02 LAB — PROTIME-INR
INR: 1.08 (ref 0.00–1.49)
Prothrombin Time: 14 seconds (ref 11.6–15.2)

## 2014-04-02 SURGERY — LEFT HEART CATHETERIZATION WITH CORONARY ANGIOGRAM
Anesthesia: LOCAL

## 2014-04-02 MED ORDER — ACETAMINOPHEN 325 MG PO TABS: 650.0000 mg | ORAL_TABLET | ORAL | Status: DC | PRN

## 2014-04-02 MED ORDER — ASPIRIN 81 MG PO CHEW
CHEWABLE_TABLET | ORAL | Status: AC
  Filled 2014-04-02: qty 1

## 2014-04-02 MED ORDER — VERAPAMIL HCL 2.5 MG/ML IV SOLN
INTRAVENOUS | Status: AC
  Filled 2014-04-02: qty 2

## 2014-04-02 MED ORDER — SODIUM CHLORIDE 0.9 % IJ SOLN: 3.0000 mL | Freq: Two times a day (BID) | INTRAMUSCULAR | Status: DC

## 2014-04-02 MED ORDER — ASPIRIN 81 MG PO CHEW
81.0000 mg | CHEWABLE_TABLET | ORAL | Status: AC
  Administered 2014-04-02: 81 mg via ORAL

## 2014-04-02 MED ORDER — FENTANYL CITRATE 0.05 MG/ML IJ SOLN
INTRAMUSCULAR | Status: AC
  Filled 2014-04-02: qty 2

## 2014-04-02 MED ORDER — HEPARIN SODIUM (PORCINE) 1000 UNIT/ML IJ SOLN
INTRAMUSCULAR | Status: AC
  Filled 2014-04-02: qty 1

## 2014-04-02 MED ORDER — SODIUM CHLORIDE 0.9 % IV SOLN: INTRAVENOUS | Status: AC

## 2014-04-02 MED ORDER — ONDANSETRON HCL 4 MG/2ML IJ SOLN: 4.0000 mg | Freq: Four times a day (QID) | INTRAMUSCULAR | Status: DC | PRN

## 2014-04-02 MED ORDER — HEPARIN (PORCINE) IN NACL 2-0.9 UNIT/ML-% IJ SOLN
INTRAMUSCULAR | Status: AC
  Filled 2014-04-02: qty 1000

## 2014-04-02 MED ORDER — NITROGLYCERIN 1 MG/10 ML FOR IR/CATH LAB
INTRA_ARTERIAL | Status: AC
  Filled 2014-04-02: qty 10

## 2014-04-02 MED ORDER — SODIUM CHLORIDE 0.9 % IV SOLN
INTRAVENOUS | Status: DC
Start: 2014-04-02 — End: 2014-04-02

## 2014-04-02 MED ORDER — LIDOCAINE HCL (PF) 1 % IJ SOLN
INTRAMUSCULAR | Status: AC
  Filled 2014-04-02: qty 30

## 2014-04-02 MED ORDER — SODIUM CHLORIDE 0.9 % IV SOLN
INTRAVENOUS | Status: DC
  Administered 2014-04-02: 09:00:00 via INTRAVENOUS

## 2014-04-02 MED ORDER — MIDAZOLAM HCL 2 MG/2ML IJ SOLN
INTRAMUSCULAR | Status: AC
  Filled 2014-04-02: qty 2

## 2014-04-02 MED ORDER — OXYCODONE-ACETAMINOPHEN 5-325 MG PO TABS
1.0000 | ORAL_TABLET | ORAL | Status: DC | PRN
Start: 2014-04-02 — End: 2014-04-02

## 2014-04-02 NOTE — CV Procedure (Addendum)
     Left Heart Catheterization with Coronary Angiography  Report  [name]  53 y.o.  male 23-May-1961  Procedure Date: 04/02/2014 Referring Physician: Hillis Range, M.D. Primary Cardiologist: Hillis Range, M.D./[facility]  INDICATIONS: Chest discomfort, apical defect on nuclear scintigraphy, and multiple risk factors for coronary disease.  PROCEDURE: 1. Left heart catheterization; 2. Coronary angiography; 3. Left ventriculography  CONSENT:  The risks, benefits, and details of the procedure were explained in detail to the patient. Risks including death, stroke, heart attack, kidney injury, allergy, limb ischemia, bleeding and radiation injury were discussed.  The patient verbalized understanding and wanted to proceed.  Informed written consent was obtained.  PROCEDURE TECHNIQUE:  After Xylocaine anesthesia a 5 French Slender sheath was placed in the right radial artery with an angiocath and the modified Seldinger technique.  Coronary angiography was done using a 5 F JR 4 and JL 3.5 cm diagnostic catheters.  Left ventriculography was done using the JL4 catheter and hand injection.   Images were reviewed and no significant obstruction was noted. Heavy proximal LAD calcification is noted.   CONTRAST:  Total of 90 cc.  COMPLICATIONS:  None   HEMODYNAMICS:  Aortic pressure 130/69 mmHg; LV pressure 146/14 mmHg; LVEDP 22 mm mercury  ANGIOGRAPHIC DATA:   The left main coronary artery is widely patent/normal.  The left anterior descending artery is proximally calcified free of any significant obstruction. The LAD wraps around the left ventricular apex. A large first diagonal arises and has ostial to proximal 30% narrowing. .  The left circumflex artery is widely patent and normal. Gives origin to 3 obtuse marginal branches with the first and second being the larger of the 3.  The right coronary artery is widely patent with distal 30% narrowing.Marland Kitchen    LEFT VENTRICULOGRAM:  Left  ventricular angiogram was done in the 30 RAO projection and revealed normal cavity size and contractility with an EF of 60%   IMPRESSIONS:  1. Calcified proximal LAD both no significant obstruction. The first diagonal contains 30% narrowing. The distal RCA contains 30% narrowing. The vessel is otherwise widely patent and without evidence of atherosclerosis.  2. Normal left ventricular systolic function with EF 60% and hemodynamic evidence of diastolic heart failure with elevated EDP.   RECOMMENDATION:  Medical management of diastolic heart failure. Also needs aggressive risk factor modification.

## 2014-04-02 NOTE — Discharge Instructions (Signed)
Radial Site Care °Refer to this sheet in the next few weeks. These instructions provide you with information on caring for yourself after your procedure. Your caregiver may also give you more specific instructions. Your treatment has been planned according to current medical practices, but problems sometimes occur. Call your caregiver if you have any problems or questions after your procedure. °HOME CARE INSTRUCTIONS °· You may shower the day after the procedure. Remove the bandage (dressing) and gently wash the site with plain soap and water. Gently pat the site dry. °· Do not apply powder or lotion to the site. °· Do not submerge the affected site in water for 3 to 5 days. °· Inspect the site at least twice daily. °· Do not flex or bend the affected arm for 24 hours. °· No lifting over 5 pounds (2.3 kg) for 5 days after your procedure. °· Do not drive home if you are discharged the same day of the procedure. Have someone else drive you. °· You may drive 24 hours after the procedure unless otherwise instructed by your caregiver. °· Do not operate machinery or power tools for 24 hours. °· A responsible adult should be with you for the first 24 hours after you arrive home. °What to expect: °· Any bruising will usually fade within 1 to 2 weeks. °· Blood that collects in the tissue (hematoma) may be painful to the touch. It should usually decrease in size and tenderness within 1 to 2 weeks. °SEEK IMMEDIATE MEDICAL CARE IF: °· You have unusual pain at the radial site. °· You have redness, warmth, swelling, or pain at the radial site. °· You have drainage (other than a small amount of blood on the dressing). °· You have chills. °· You have a fever or persistent symptoms for more than 72 hours. °· You have a fever and your symptoms suddenly get worse. °· Your arm becomes pale, cool, tingly, or numb. °· You have heavy bleeding from the site. Hold pressure on the site. °Document Released: 07/30/2010 Document Revised:  09/19/2011 Document Reviewed: 07/30/2010 °ExitCare® Patient Information ©2015 [employer]. This information is not intended to replace advice given to you by your health care provider. Make sure you discuss any questions you have with your health care provider. ° °

## 2014-04-02 NOTE — Interval H&P Note (Signed)
Cath Lab Visit (complete for each Cath Lab visit)  Clinical Evaluation Leading to the Procedure:   ACS: No.  Non-ACS:    Anginal Classification: CCS III  Anti-ischemic medical therapy: Minimal Therapy (1 class of medications)  Non-Invasive Test Results: Low-risk stress test findings: cardiac mortality <1%/year  Prior CABG: No previous CABG      History and Physical Interval Note:  04/02/2014 9:40 AM  [name]  has presented today for surgery, with the diagnosis of abnormal stress test  The various methods of treatment have been discussed with the patient and family. After consideration of risks, benefits and other options for treatment, the patient has consented to  Procedure(s): LEFT HEART CATHETERIZATION WITH CORONARY ANGIOGRAM (N/A) as a surgical intervention .  The patient's history has been reviewed, patient examined, no change in status, stable for surgery.  I have reviewed the patient's chart and labs.  Questions were answered to the patient's satisfaction.     Lesleigh Noe

## 2014-04-17 ENCOUNTER — Encounter: Payer: Self-pay | Admitting: Physician Assistant

## 2014-04-17 ENCOUNTER — Ambulatory Visit (INDEPENDENT_AMBULATORY_CARE_PROVIDER_SITE_OTHER): Payer: BC Managed Care – PPO | Admitting: Physician Assistant

## 2014-04-17 VITALS — BP 138/68 | HR 76 | Ht 73.0 in | Wt 319.0 lb

## 2014-04-17 DIAGNOSIS — I2510 Atherosclerotic heart disease of native coronary artery without angina pectoris: Secondary | ICD-10-CM

## 2014-04-17 DIAGNOSIS — I5032 Chronic diastolic (congestive) heart failure: Secondary | ICD-10-CM

## 2014-04-17 DIAGNOSIS — I10 Essential (primary) hypertension: Secondary | ICD-10-CM

## 2014-04-17 DIAGNOSIS — E785 Hyperlipidemia, unspecified: Secondary | ICD-10-CM

## 2014-04-17 DIAGNOSIS — E119 Type 2 diabetes mellitus without complications: Secondary | ICD-10-CM

## 2014-04-17 DIAGNOSIS — G4730 Sleep apnea, unspecified: Secondary | ICD-10-CM

## 2014-04-17 MED ORDER — SPIRONOLACTONE 25 MG PO TABS: 12.5000 mg | ORAL_TABLET | Freq: Every day | ORAL | Status: DC

## 2014-04-17 NOTE — Patient Instructions (Signed)
Your physician has recommended you make the following change in your medication:  1. START SPIRONOLACTONE 12.5 MG DAILY  LAB WORK ON 10/14 DUE TO NEW START OF SPIRONOLACTONE  LAB WORK ON 10/21 DUE TO NEW START OF SPIRONOLACTONE   PLEASE FOLLOW UP WITH HavelockSCOTT WEAVER, [facility] ON 05/16/14 @ 8:50

## 2014-04-17 NOTE — Progress Notes (Addendum)
Cardiology Office Note   Date:  04/17/2014   ID:  [name], DOB May 13, 1961, MRN [id number]  PCP:  Nilda Simmer, MD  Cardiologist:  Dr. Verne Carrow (patient request)     History of Present Illness: [name] is a 53 y.o. male with a history of HTN, HL, morbid obesity status post lap band surgery, diabetes, sleep apnea and strong family history of CAD.  He was seen in the emergency room 03/01/14 with chest pain and dyspnea. Cardiac markers were normal. He was seen by Dr. Johney Frame and outpatient evaluation was recommended.  Myoview demonstrated apical defect (possible scar).  LHC was recommended.  This demonstrated mild plaque but no obstructive disease.  LVEDP was elevated and continued management of diastolic CHF was recommended.  He returns for FU.  He continues to have dyspnea with more extreme activities. He is NYHA IIb. He denies significant chest discomfort. He denies syncope. He sleeps on 2 pillows chronically. He sleeps with CPAP. He denies LE edema.   Studies:  - LHC (9/15):  Ostial-proximal LAD 30%, distal RCA 30%, EF 60%; LVEDP 22 mm Hg  - Echo (9/15):  Moderate LVH, EF 60-65%, grade 1 diastolic dysfunction  - Nuclear (8/15):  Fixed apical defect (possible scar), no ischemia, EF 53%, low risk   Recent Labs/Images:   Recent Labs  12/30/13 1029 03/01/14 0911  03/01/14 1016  04/02/14 0830  NA 139 136  --   --   < > 138  K 4.4 4.4  --   --   < > 3.9  BUN 11 21  --   --   < > 15  CREATININE 0.85 0.87  --   --   < > 0.62  ALT 10 12  --   --   --   --   HGB 13.0  --   < >  --   < > 12.9*  TSH  --  2.311  --   --   --   --   LDLCALC 105*  --   --   --   --   --   HDL 44  --   --   --   --   --   PROBNP  --   --   --  94.3  --   --   < > = values in this interval not displayed.     Wt Readings from Last 3 Encounters:  04/17/14 319 lb (144.697 kg)  04/02/14 312 lb (141.522 kg)  04/02/14 312 lb (141.522 kg)     Past Medical History  Diagnosis Date  .  Diabetes mellitus   . Hyperlipidemia   . Hypertension   . Impotence of organic origin   . Obesity, unspecified   . Allergic rhinitis, cause unspecified   . Other chronic allergic conjunctivitis   . Obstructive sleep apnea (adult) (pediatric)   . Degeneration of intervertebral disc, site unspecified   . Personal history of colonic polyps     Current Outpatient Prescriptions  Medication Sig Dispense Refill  . aspirin EC 81 MG tablet Take 81 mg by mouth daily.      . Calcium-Magnesium-Vitamin D (CALCIUM 500 PO) Take 500 mg by mouth 3 (three) times daily.      . cyclobenzaprine (FLEXERIL) 5 MG tablet Take 5 mg by mouth at bedtime as needed for muscle spasms.      Marland Kitchen FLUoxetine (PROZAC) 20 MG tablet Take 1 tablet (20 mg total) by mouth  daily.  30 tablet  11  . glimepiride (AMARYL) 4 MG tablet Take 1 tablet (4 mg total) by mouth 2 (two) times daily.  60 tablet  11  . insulin glargine (LANTUS) 100 UNIT/ML injection Inject 76 Units into the skin every morning.      . Insulin Syringe-Needle U-100 (B-D INS SYR ULTRAFINE 1CC/30G) 30G X 1/2" 1 ML MISC 1 each by Does not apply route See admin instructions. Use daily with insulin      . metFORMIN (GLUCOPHAGE) 1000 MG tablet Take 1 tablet (1,000 mg total) by mouth 2 (two) times daily with a meal.  60 tablet  11  . Multiple Vitamin (MULTIVITAMIN) capsule Take 1 capsule by mouth daily.      Marland Kitchen. NOVOLOG FLEXPEN 100 UNIT/ML FlexPen Inject 5-15 Units into the skin 3 (three) times daily as needed for high blood sugar (according to sliding scale).       . ramipril (ALTACE) 10 MG capsule Take 1 capsule (10 mg total) by mouth daily.  30 capsule  11  . sildenafil (VIAGRA) 100 MG tablet Take 100 mg by mouth daily as needed for erectile dysfunction.       . simvastatin (ZOCOR) 40 MG tablet TAKE ONE (1) TABLET BY MOUTH EVERY DAY  30 tablet  1   No current facility-administered medications for this visit.     Allergies:   Diclofenac sodium and Voltaren   Social  History:  The patient  reports that he has quit smoking. His smoking use included Cigarettes. He smoked 0.00 packs per day. He has never used smokeless tobacco. He reports that he drinks alcohol. He reports that he does not use illicit drugs.   Family History:  The patient's family history includes Cancer in his father, maternal uncle, and paternal grandfather; Depression in his sister; Diabetes in his father, mother, and sister; Heart disease (age of onset: 4562) in his father; Hyperlipidemia in his father; Hypertension in his father; Parkinson's disease in his father; Peripheral Artery Disease in his father.   ROS:  Please see the history of present illness.       All other systems reviewed and negative.    PHYSICAL EXAM: VS:  BP 138/68  Pulse 76  Ht 6\' 1"  (1.854 m)  Wt 319 lb (144.697 kg)  BMI 42.10 kg/m2 Well nourished, well developed, in no acute distress HEENT: normal Neck:  no JVD Cardiac:  normal S1, S2;  RRR; no murmur Lungs:   clear to auscultation bilaterally, no wheezing, rhonchi or rales Abd: soft, nontender, no hepatomegaly Ext: right wrist without hematoma or mass;  no edema Skin: warm and dry Neuro:  CNs 2-12 intact, no focal abnormalities noted  EKG:  NSR, HR 76, normal axis, no ST changes      ASSESSMENT AND PLAN:  1.  Chronic diastolic CHF:  LVEDP was elevated on recent cardiac catheterization. Given his dyspnea, I will start him on low-dose diuretic therapy. I will place him on spironolactone 12.5 mg daily. Check a basic metabolic panel in 5 days and 12 days.  2.  Coronary artery disease:  Mild plaque by recent cardiac catheterization. Continue aspirin, statin.    3.  Essential hypertension:  Controlled.  4.  Dyslipidemia:  Managed by primary care. Continue statin.  5.  Type 2 diabetes mellitus without complication:  Poor control. Recent Hgb A1c 11.6. We discussed the importance of strict control. Follow up with primary care.  6.  Sleep apnea:  Continue  CPAP.  Disposition:    FU with me in 1 month.   Signed, Brynda Rim, MHS 04/17/2014 8:40 AM    [facility] [address] [place], [place], [place]  [address] Phone: [phone]; Fax: [phone]

## 2014-04-23 ENCOUNTER — Other Ambulatory Visit (INDEPENDENT_AMBULATORY_CARE_PROVIDER_SITE_OTHER): Payer: BC Managed Care – PPO | Admitting: *Deleted

## 2014-04-23 DIAGNOSIS — I5032 Chronic diastolic (congestive) heart failure: Secondary | ICD-10-CM

## 2014-04-23 LAB — BASIC METABOLIC PANEL
BUN: 19 mg/dL (ref 6–23)
CALCIUM: 9 mg/dL (ref 8.4–10.5)
CO2: 26 mEq/L (ref 19–32)
Chloride: 102 mEq/L (ref 96–112)
Creatinine, Ser: 0.9 mg/dL (ref 0.4–1.5)
GFR: 93.11 mL/min (ref >60.00)
Glucose, Bld: 152 mg/dL — ABNORMAL HIGH (ref 70–99)
Potassium: 3.9 mEq/L (ref 3.5–5.1)
SODIUM: 136 meq/L (ref 135–145)

## 2014-04-24 ENCOUNTER — Telehealth: Payer: Self-pay | Admitting: *Deleted

## 2014-04-24 NOTE — Telephone Encounter (Signed)
pt's wife notified about lab results with verbal understanding 

## 2014-04-25 ENCOUNTER — Other Ambulatory Visit: Payer: Self-pay

## 2014-04-25 ENCOUNTER — Other Ambulatory Visit: Payer: Self-pay | Admitting: Family Medicine

## 2014-04-29 ENCOUNTER — Telehealth: Payer: Self-pay

## 2014-04-29 ENCOUNTER — Other Ambulatory Visit: Payer: Self-pay | Admitting: Family Medicine

## 2014-04-29 MED ORDER — FLUOXETINE HCL 20 MG PO TABS: 20.0000 mg | ORAL_TABLET | Freq: Every day | ORAL | Status: DC

## 2014-04-29 NOTE — Telephone Encounter (Signed)
Patient wife, Marchelle Folksmanda, called regarding refill request that was denied. He missed his last appt because he was in hospital. He has appt scheduled 05/28/2014. Wanted a refill until then. Spoke with Benny LennertSarah Weber PA-C and approve 30 day supply until appt. RX sent to pharmacy and [facility] notified.

## 2014-04-29 NOTE — Telephone Encounter (Signed)
Patient is upset due to denial of medication, they have appointment on Nov 18, first available.   Please call [phone] (M)

## 2014-04-30 ENCOUNTER — Other Ambulatory Visit (INDEPENDENT_AMBULATORY_CARE_PROVIDER_SITE_OTHER): Payer: BC Managed Care – PPO | Admitting: *Deleted

## 2014-04-30 DIAGNOSIS — I5032 Chronic diastolic (congestive) heart failure: Secondary | ICD-10-CM

## 2014-04-30 LAB — BASIC METABOLIC PANEL
BUN: 21 mg/dL (ref 6–23)
CHLORIDE: 101 meq/L (ref 96–112)
CO2: 31 mEq/L (ref 19–32)
Calcium: 9.2 mg/dL (ref 8.4–10.5)
Creatinine, Ser: 0.9 mg/dL (ref 0.4–1.5)
GFR: 99.45 mL/min (ref >60.00)
Glucose, Bld: 145 mg/dL — ABNORMAL HIGH (ref 70–99)
POTASSIUM: 4.4 meq/L (ref 3.5–5.1)
Sodium: 140 mEq/L (ref 135–145)

## 2014-04-30 MED ORDER — FLUOXETINE HCL 20 MG PO TABS: 20.0000 mg | ORAL_TABLET | Freq: Every day | ORAL | Status: DC

## 2014-04-30 MED ORDER — RAMIPRIL 10 MG PO CAPS: 10.0000 mg | ORAL_CAPSULE | Freq: Every day | ORAL | Status: DC

## 2014-04-30 NOTE — Telephone Encounter (Signed)
Sent in #30 day supply.  Pt advised.

## 2014-05-01 ENCOUNTER — Telehealth: Payer: Self-pay | Admitting: *Deleted

## 2014-05-01 NOTE — Telephone Encounter (Signed)
pt notified about lab results with verbal understanding  

## 2014-05-05 ENCOUNTER — Other Ambulatory Visit: Payer: Self-pay | Admitting: Family Medicine

## 2014-05-06 ENCOUNTER — Other Ambulatory Visit: Payer: Self-pay | Admitting: Family Medicine

## 2014-05-16 ENCOUNTER — Encounter: Payer: Self-pay | Admitting: Physician Assistant

## 2014-05-16 ENCOUNTER — Ambulatory Visit (INDEPENDENT_AMBULATORY_CARE_PROVIDER_SITE_OTHER): Payer: BC Managed Care – PPO | Admitting: Physician Assistant

## 2014-05-16 VITALS — BP 140/70 | HR 87 | Ht 73.0 in | Wt 326.0 lb

## 2014-05-16 DIAGNOSIS — I10 Essential (primary) hypertension: Secondary | ICD-10-CM

## 2014-05-16 DIAGNOSIS — E119 Type 2 diabetes mellitus without complications: Secondary | ICD-10-CM

## 2014-05-16 DIAGNOSIS — I2510 Atherosclerotic heart disease of native coronary artery without angina pectoris: Secondary | ICD-10-CM

## 2014-05-16 DIAGNOSIS — I5032 Chronic diastolic (congestive) heart failure: Secondary | ICD-10-CM

## 2014-05-16 DIAGNOSIS — E785 Hyperlipidemia, unspecified: Secondary | ICD-10-CM

## 2014-05-16 DIAGNOSIS — G4730 Sleep apnea, unspecified: Secondary | ICD-10-CM

## 2014-05-16 NOTE — Progress Notes (Signed)
Cardiology Office Note   Date:  05/16/2014   ID:  [name], DOB 12/24/1960, MRN [id number]  PCP:  Nilda SimmerSMITH,KRISTI, MD  Cardiologist:  Dr. Verne Carrowhristopher McAlhany (patient request)     History of Present Illness: [name] is a 53 y.o. male with a history of HTN, HL, morbid obesity status post lap band surgery, diabetes, sleep apnea and strong family history of CAD.  He was seen in the emergency room 03/01/14 with chest pain and dyspnea. Cardiac markers were normal. He was seen by Dr. Johney FrameAllred and outpatient evaluation was recommended.  Myoview demonstrated apical defect (possible scar).  LHC was recommended.  This demonstrated mild plaque but no obstructive disease.  LVEDP was elevated and continued management of diastolic CHF was recommended.    I saw him last month for FU.  He continued to have DOE and I placed him on Spironolactone 12.5 mg QD.  FU labs were ok.  He returns for FU.  His breathing is better.  He is now NYHA 2.  He denies orthopnea, PND, edema.  No chest pain, syncope.  [place] this [place] to participate in a [place] re-enactment.     Studies:  - LHC (9/15):  Ostial-proximal LAD 30%, distal RCA 30%, EF 60%; LVEDP 22 mm Hg  - Echo (9/15):  Moderate LVH, EF 60-65%, grade 1 diastolic dysfunction  - Nuclear (8/15):  Fixed apical defect (possible scar), no ischemia, EF 53%, low risk   Recent Labs/Images:  Recent Labs  12/30/13 1029 03/01/14 0911  03/01/14 1016  04/02/14 0830  04/30/14 0831  NA 139 136  --   --   < > 138  < > 140  K 4.4 4.4  --   --   < > 3.9  < > 4.4  BUN 11 21  --   --   < > 15  < > 21  CREATININE 0.85 0.87  --   --   < > 0.62  < > 0.9  ALT 10 12  --   --   --   --   --   --   HGB 13.0  --   < >  --   < > 12.9*  --   --   TSH  --  2.311  --   --   --   --   --   --   LDLCALC 105*  --   --   --   --   --   --   --   HDL 44  --   --   --   --   --   --   --   PROBNP  --   --   --  94.3  --   --   --   --   < > = values  in this interval not displayed.     Wt Readings from Last 3 Encounters:  05/16/14 326 lb (147.873 kg)  04/17/14 319 lb (144.697 kg)  04/02/14 312 lb (141.522 kg)     Past Medical History  Diagnosis Date  . Diabetes mellitus   . Hyperlipidemia   . Hypertension   . Impotence of organic origin   . Obesity, unspecified   . Allergic rhinitis, cause unspecified   . Other chronic allergic conjunctivitis   . Obstructive sleep apnea (adult) (pediatric)   . Degeneration of intervertebral disc, site unspecified   . Personal history of colonic  polyps     Current Outpatient Prescriptions  Medication Sig Dispense Refill  . aspirin EC 81 MG tablet Take 81 mg by mouth daily.    . B-D INS SYR ULTRAFINE 1CC/31G 31G X 5/16" 1 ML MISC USE FOR INSULIN INJECTIONS THREE TIMES DAILY 100 each 5  . Calcium-Magnesium-Vitamin D (CALCIUM 500 PO) Take 500 mg by mouth 3 (three) times daily.    . cyclobenzaprine (FLEXERIL) 5 MG tablet Take 5 mg by mouth at bedtime as needed for muscle spasms.    Marland Kitchen. FLUoxetine (PROZAC) 20 MG tablet Take 1 tablet (20 mg total) by mouth daily. 30 tablet 0  . glimepiride (AMARYL) 4 MG tablet Take 1 tablet (4 mg total) by mouth 2 (two) times daily. 60 tablet 11  . insulin glargine (LANTUS) 100 UNIT/ML injection Inject 76 Units into the skin every morning.    . Insulin Syringe-Needle U-100 (B-D INS SYR ULTRAFINE 1CC/30G) 30G X 1/2" 1 ML MISC 1 each by Does not apply route See admin instructions. Use daily with insulin    . metFORMIN (GLUCOPHAGE) 1000 MG tablet Take 1 tablet (1,000 mg total) by mouth 2 (two) times daily with a meal. 60 tablet 11  . Multiple Vitamin (MULTIVITAMIN) capsule Take 1 capsule by mouth daily.    Marland Kitchen. NOVOLOG FLEXPEN 100 UNIT/ML FlexPen Inject 5-15 Units into the skin 3 (three) times daily as needed for high blood sugar (according to sliding scale).     . ramipril (ALTACE) 10 MG capsule Take 1 capsule (10 mg total) by mouth daily. 30 capsule 0  . sildenafil  (VIAGRA) 100 MG tablet Take 100 mg by mouth daily as needed for erectile dysfunction.     . simvastatin (ZOCOR) 40 MG tablet TAKE ONE (1) TABLET BY MOUTH EVERY DAY 30 tablet 1  . spironolactone (ALDACTONE) 25 MG tablet Take 0.5 tablets (12.5 mg total) by mouth daily. 30 tablet 11   No current facility-administered medications for this visit.     Allergies:   Diclofenac sodium and Voltaren   Social History:  The patient  reports that he has quit smoking. His smoking use included Cigarettes. He smoked 0.00 packs per day. He has never used smokeless tobacco. He reports that he drinks alcohol. He reports that he does not use illicit drugs.   Family History:  The patient's family history includes Cancer in his father, maternal uncle, and paternal grandfather; Depression in his sister; Diabetes in his father, mother, and sister; Heart disease (age of onset: 2362) in his father; Hyperlipidemia in his father; Hypertension in his father; Parkinson's disease in his father; Peripheral Artery Disease in his father.    ROS:  Please see the history of present illness.        All other systems reviewed and negative.    PHYSICAL EXAM: VS:  BP 140/70 mmHg  Pulse 87  Ht 6\' 1"  (1.854 m)  Wt 326 lb (147.873 kg)  BMI 43.02 kg/m2 Well nourished, well developed, in no acute distress HEENT: normal Neck:  no JVD Cardiac:  normal S1, S2;  RRR; no murmur Lungs:   clear to auscultation bilaterally, no wheezing, rhonchi or rales Abd: soft, nontender, no hepatomegaly Ext: right wrist without hematoma or mass;  no edema Skin: warm and dry Neuro:  CNs 2-12 intact, no focal abnormalities noted  EKG:  NSR HR 87, normal axis, no ST changes.      ASSESSMENT AND PLAN:  1. Chronic diastolic CHF:   Improved.  Continue current Rx.    -  Check BMET in 2 weeks.  2.  Coronary artery disease:  Mild plaque by recent cardiac catheterization. No angina. Continue aspirin, statin.   3.  Essential hypertension:   Borderline  control.  Continue to monitor.  Consider increasing Spironolactone to 25 mg QD if BP remains elevated.  4.  Dyslipidemia:   Managed by primary care. Continue statin. 5.  Type 2 diabetes mellitus without complication:  FU with PCP.  Recent A1c over 11. 6.  Sleep apnea:  Continue CPAP.   Disposition:    FU with Dr. Verne Carrow 6 mos.   Signed, Brynda Rim, MHS 05/16/2014 9:17 AM    [facility] [address] [place], [place], [place]  [address] Phone: [phone]; Fax: [phone]

## 2014-05-16 NOTE — Patient Instructions (Signed)
I will put an order in for you to get a blood test on 05/28/14 when you see Dr. Katrinka BlazingSmith (BMET) to recheck your potassium.  Schedule a follow up appointment with Dr. Verne Carrowhristopher McAlhany in 6 mos.  The office will mail you a card 2-3 mos before you need to schedule this appointment.   Call for earlier follow up if needed.

## 2014-05-22 ENCOUNTER — Other Ambulatory Visit: Payer: Self-pay | Admitting: Family Medicine

## 2014-05-28 ENCOUNTER — Encounter: Payer: Self-pay | Admitting: Family Medicine

## 2014-05-28 ENCOUNTER — Ambulatory Visit (INDEPENDENT_AMBULATORY_CARE_PROVIDER_SITE_OTHER): Payer: BC Managed Care – PPO | Admitting: Family Medicine

## 2014-05-28 VITALS — BP 138/80 | HR 108 | Temp 97.8°F | Resp 16 | Ht 73.0 in | Wt 315.8 lb

## 2014-05-28 DIAGNOSIS — E669 Obesity, unspecified: Secondary | ICD-10-CM

## 2014-05-28 DIAGNOSIS — J0100 Acute maxillary sinusitis, unspecified: Secondary | ICD-10-CM

## 2014-05-28 DIAGNOSIS — I10 Essential (primary) hypertension: Secondary | ICD-10-CM

## 2014-05-28 DIAGNOSIS — I5030 Unspecified diastolic (congestive) heart failure: Secondary | ICD-10-CM

## 2014-05-28 DIAGNOSIS — R0609 Other forms of dyspnea: Secondary | ICD-10-CM

## 2014-05-28 DIAGNOSIS — Z125 Encounter for screening for malignant neoplasm of prostate: Secondary | ICD-10-CM

## 2014-05-28 DIAGNOSIS — E1310 Other specified diabetes mellitus with ketoacidosis without coma: Secondary | ICD-10-CM

## 2014-05-28 DIAGNOSIS — E785 Hyperlipidemia, unspecified: Secondary | ICD-10-CM

## 2014-05-28 DIAGNOSIS — E1110 Type 2 diabetes mellitus with ketoacidosis without coma: Secondary | ICD-10-CM

## 2014-05-28 DIAGNOSIS — Z0000 Encounter for general adult medical examination without abnormal findings: Secondary | ICD-10-CM

## 2014-05-28 DIAGNOSIS — R0600 Dyspnea, unspecified: Secondary | ICD-10-CM

## 2014-05-28 LAB — CBC WITH DIFFERENTIAL/PLATELET
Basophils Absolute: 0.1 10*3/uL (ref 0.0–0.1)
Basophils Relative: 1 % (ref 0–1)
Eosinophils Absolute: 0.2 10*3/uL (ref 0.0–0.7)
Eosinophils Relative: 4 % (ref 0–5)
HEMATOCRIT: 42.3 % (ref 39.0–52.0)
Hemoglobin: 14.7 g/dL (ref 13.0–17.0)
LYMPHS ABS: 2.6 10*3/uL (ref 0.7–4.0)
LYMPHS PCT: 42 % (ref 12–46)
MCH: 29.8 pg (ref 26.0–34.0)
MCHC: 34.8 g/dL (ref 30.0–36.0)
MCV: 85.6 fL (ref 78.0–100.0)
MONOS PCT: 8 % (ref 3–12)
MPV: 10.3 fL (ref 9.4–12.4)
Monocytes Absolute: 0.5 10*3/uL (ref 0.1–1.0)
NEUTROS ABS: 2.8 10*3/uL (ref 1.7–7.7)
Neutrophils Relative %: 45 % (ref 43–77)
Platelets: 296 10*3/uL (ref 150–400)
RBC: 4.94 MIL/uL (ref 4.22–5.81)
RDW: 14.1 % (ref 11.5–15.5)
WBC: 6.2 10*3/uL (ref 4.0–10.5)

## 2014-05-28 LAB — POCT URINALYSIS DIPSTICK
Bilirubin, UA: NEGATIVE
GLUCOSE UA: 1000
Leukocytes, UA: NEGATIVE
Nitrite, UA: NEGATIVE
PROTEIN UA: NEGATIVE
Spec Grav, UA: 1.015
UROBILINOGEN UA: 0.2
pH, UA: 5

## 2014-05-28 LAB — POCT GLYCOSYLATED HEMOGLOBIN (HGB A1C): HEMOGLOBIN A1C: 11.4

## 2014-05-28 MED ORDER — AMOXICILLIN-POT CLAVULANATE 875-125 MG PO TABS: 1.0000 | ORAL_TABLET | Freq: Two times a day (BID) | ORAL | Status: DC

## 2014-05-28 NOTE — Progress Notes (Signed)
Subjective:    Patient ID: [name], male    DOB: 1961/05/03, 53 y.o.   MRN: [id number]  05/28/2014  Annual Exam   HPI This 53 y.o. male presents for Complete Physical Examination.  Last physical: 2014 Colonoscopy:  07/27/2009; [facility]; repeat in 5 years.   TDAP:  2012 Pneumovax:  2008 Zostavax: never Influenza:  04/25/2014 Eye exam:  07/2013 Dental exam:   Every six months.   CHF diastolic grade II:  Started Spironolactone by cardiology.    SOB: has been feeling SOB with exertion.  Will get sweaty and dizzy.  Has been sweating excessively. Feeling some better on Spirolonactone.  Continues to sweat excessively and feeling fatigued.  S/p echo; stress test.  Cardiac catheterization.  Not as bad, but still present.  Decreased energy.  Fatigues easier.    DMII:  134 fasting; 108 fasting yesterday; had 300s over the  Weekend.  Struggling with Novolog.  Not eating breakfast until 10:00am and then eating supper is late.  Lantus 76 units daily.  One low sugar per month on average.  Using Novolog at least once per day; usually administers  4-5 units per meal.  Does not administer at lunch.  Breakfast and supper.    Headaches, ear pain, congestion: onset several weeks with recent worsening. Also has bad tooth in upper front region.     Review of Systems  Constitutional: Negative for fever, chills, diaphoresis, activity change, appetite change, fatigue and unexpected weight change.  HENT: Positive for congestion, ear pain, hearing loss, rhinorrhea and sinus pressure. Negative for dental problem, drooling, ear discharge, facial swelling, mouth sores, nosebleeds, postnasal drip, sneezing, sore throat, tinnitus, trouble swallowing and voice change.   Eyes: Negative for photophobia, pain, discharge, redness, itching and visual disturbance.  Respiratory: Positive for shortness of breath. Negative for apnea, cough, choking, chest tightness, wheezing and stridor.   Cardiovascular: Negative for  chest pain, palpitations and leg swelling.  Gastrointestinal: Negative for nausea, vomiting, abdominal pain, diarrhea, constipation and blood in stool.  Endocrine: Negative for cold intolerance, heat intolerance, polydipsia, polyphagia and polyuria.  Genitourinary: Negative for dysuria, urgency, frequency, hematuria, flank pain, decreased urine volume, discharge, penile swelling, scrotal swelling, enuresis, difficulty urinating, genital sores, penile pain and testicular pain.  Musculoskeletal: Negative for myalgias, back pain, joint swelling, arthralgias, gait problem, neck pain and neck stiffness.  Skin: Negative for color change, pallor, rash and wound.  Allergic/Immunologic: Positive for environmental allergies. Negative for food allergies and immunocompromised state.  Neurological: Positive for headaches. Negative for dizziness, tremors, seizures, syncope, facial asymmetry, speech difficulty, weakness, light-headedness and numbness.  Hematological: Negative for adenopathy. Does not bruise/bleed easily.  Psychiatric/Behavioral: Negative for suicidal ideas, hallucinations, behavioral problems, confusion, sleep disturbance, self-injury, dysphoric mood, decreased concentration and agitation. The patient is not nervous/anxious and is not hyperactive.     Past Medical History  Diagnosis Date  . Diabetes mellitus   . Hyperlipidemia   . Hypertension   . Impotence of organic origin   . Obesity, unspecified   . Allergic rhinitis, cause unspecified   . Other chronic allergic conjunctivitis   . Obstructive sleep apnea (adult) (pediatric)   . Degeneration of intervertebral disc, site unspecified   . Personal history of colonic polyps    Past Surgical History  Procedure Laterality Date  . Laparoscopic gastric banding  09/15/09  . Back surgery      x3  . Quadriceps tendon repair      left  . Quadriceps repair  left  . Rotator cuff repair      right  . Appendectomy    . Tonsillectomy and  adenoidectomy  1960  . Arthroscopy of knee  1980  . Colonoscopy  07/11/2009    single polyp; repeat in 5 years.  [name].  . Allergy consult  07/11/2010    SOB, itching, facial tingling, hypotension.  Allergy testing: no food allergies; +reaction to tree pollen, grass pollen, dust mites, mold.  Avoid diclofenac and similar products.  [name].  [name] consult  07/11/2010    CT chest in ED (gastric thickening).  [name].  No EGD warranted due to lack of symptoms.  . Sleep study  07/11/2010    repeat sleep study due to weight loss.  Severe OSA; CPAP titration to 8 cm.    . Admission  01/2009    [facility].  Syncopal event with nausea, diaphoresis, hypertension.  D-dimer elevated at 2.54 with negative CT chest. and VQ scan.  Cardiac enzymes negative x 3.  Labs normal.  Cardiology consult---cardiolite abn; cath, 2D-echo, holter monitor unrevealing.  SE H&V.  Marland Kitchen Spine surgery     Allergies  Allergen Reactions  . Diclofenac Sodium Shortness Of Breath, Itching and Other (See Comments)    Hypotension   . Voltaren [Diclofenac Sodium] Anaphylaxis   Current Outpatient Prescriptions  Medication Sig Dispense Refill  . aspirin EC 81 MG tablet Take 81 mg by mouth daily.    . Calcium-Magnesium-Vitamin D (CALCIUM 500 PO) Take 500 mg by mouth 3 (three) times daily.    . cyclobenzaprine (FLEXERIL) 5 MG tablet Take 5 mg by mouth at bedtime as needed for muscle spasms.    Marland Kitchen FLUoxetine (PROZAC) 20 MG tablet Take 1 tablet (20 mg total) by mouth daily. 30 tablet 0  . insulin glargine (LANTUS) 100 UNIT/ML injection Inject 76 Units into the skin every morning.    . Insulin Syringe-Needle U-100 (B-D INS SYR ULTRAFINE 1CC/30G) 30G X 1/2" 1 ML MISC 1 each by Does not apply route See admin instructions. Use daily with insulin    . metFORMIN (GLUCOPHAGE) 1000 MG tablet Take 1 tablet (1,000 mg total) by mouth 2 (two) times daily with a meal. 60 tablet 11  . Multiple Vitamin (MULTIVITAMIN) capsule Take 1 capsule by mouth daily.    Marland Kitchen  NOVOLOG FLEXPEN 100 UNIT/ML FlexPen Inject 5-15 Units into the skin 3 (three) times daily as needed for high blood sugar (according to sliding scale).     . ramipril (ALTACE) 10 MG capsule Take 1 capsule (10 mg total) by mouth daily. 30 capsule 0  . sildenafil (VIAGRA) 100 MG tablet Take 100 mg by mouth daily as needed for erectile dysfunction.     . simvastatin (ZOCOR) 40 MG tablet TAKE ONE (1) TABLET BY MOUTH EVERY DAY 30 tablet 1  . spironolactone (ALDACTONE) 25 MG tablet Take 0.5 tablets (12.5 mg total) by mouth daily. 30 tablet 11  . amoxicillin-clavulanate (AUGMENTIN) 875-125 MG per tablet Take 1 tablet by mouth 2 (two) times daily. 20 tablet 0  . B-D INS SYR ULTRAFINE 1CC/31G 31G X 5/16" 1 ML MISC USE FOR INSULIN INJECTIONS THREE TIMES DAILY 100 each 5   No current facility-administered medications for this visit.       Objective:    BP 138/80 mmHg  Pulse 108  Temp(Src) 97.8 F (36.6 C) (Oral)  Resp 16  Ht 6\' 1"  (1.854 m)  Wt 315 lb 12.8 oz (143.246 kg)  BMI 41.67 kg/m2  SpO2  97% Physical Exam  Constitutional: He is oriented to person, place, and time. He appears well-developed and well-nourished. No distress.  Morbidly obese.  HENT:  Head: Normocephalic and atraumatic.  Right Ear: External ear normal.  Left Ear: External ear normal.  Nose: Mucosal edema and rhinorrhea present. Right sinus exhibits maxillary sinus tenderness and frontal sinus tenderness. Left sinus exhibits maxillary sinus tenderness and frontal sinus tenderness.  Mouth/Throat: Oropharynx is clear and moist.  Eyes: Conjunctivae and EOM are normal. Pupils are equal, round, and reactive to light.  Neck: Normal range of motion. Neck supple. Carotid bruit is not present. No thyromegaly present.  Cardiovascular: Normal rate, regular rhythm, normal heart sounds and intact distal pulses.  Exam reveals no gallop and no friction rub.   No murmur heard. Pulmonary/Chest: Effort normal and breath sounds normal. He has  no wheezes. He has no rales.  Abdominal: Soft. Bowel sounds are normal. He exhibits no distension and no mass. There is no tenderness. There is no rebound and no guarding. Hernia confirmed negative in the right inguinal area and confirmed negative in the left inguinal area.  Genitourinary: Rectum normal, prostate normal, testes normal and penis normal. Right testis shows no mass, no swelling and no tenderness. Left testis shows no mass, no swelling and no tenderness.  Musculoskeletal:       Right shoulder: Normal.       Left shoulder: Normal.       Cervical back: Normal.  Lymphadenopathy:    He has no cervical adenopathy.       Right: No inguinal adenopathy present.       Left: No inguinal adenopathy present.  Neurological: He is alert and oriented to person, place, and time. He has normal reflexes. No cranial nerve deficit. He exhibits normal muscle tone. Coordination normal.  Skin: Skin is warm and dry. No rash noted. He is not diaphoretic. No erythema. No pallor.  Psychiatric: He has a normal mood and affect. His behavior is normal. Judgment and thought content normal.        Assessment & Plan:   1. Routine general medical examination at a health care facility   2. DM (diabetes mellitus) type 2, uncontrolled, with ketoacidosis   3. Essential hypertension   4. Hyperlipidemia   5. Screening for prostate cancer   6. Dyspnea on exertion   7. Acute maxillary sinusitis, recurrence not specified   8. CHF with left ventricular diastolic dysfunction, NYHA class 1   9. Obesity      1. Complete Physical Examination: anticipatory guidance --- exercise, weight loss.  Immunizations UTD.  Colonoscopy UTD. 2.  DMII: uncontrolled; obtain labs; continue Lantus at current dose due to stable/good fasting sugars.  Will likely warrant increase in Novolog dose.  Non-compliant with tid Novolog dosing.  Monofilament exam UTD.  Eye exam UTD.  Obtain urine microalbumin. 3.  HTN: controlled; obtain labs.     4.  Hyperlipidemia: controlled; obtain labs; continue current medication. 5.  Screening prostate cancer: obtain PSA; DRE completed. 6.  DOE: New.  S/p extensive cardiology evaluation with mild relief with Spironolactone but still suffering with significant DOE.  Obtain CT angio to rule out PE. If negative, refer to pulmonology.   7.  Acute maxillary sinusitis: New. Rx for Augmentin provided. 8. CHF diastolic: stable; Spironolactone added by cardiology recently. 9. Obesity: persistent; s/p lap band; encourage weight loss and exercise.    Meds ordered this encounter  Medications  . amoxicillin-clavulanate (AUGMENTIN) 875-125 MG per tablet  Sig: Take 1 tablet by mouth 2 (two) times daily.    Dispense:  20 tablet    Refill:  0    Return in about 3 months (around 08/28/2014).    Nilda SimmerKristi Jiyah Torpey, M.D.  [facility]  [place] [address] [place], [place]  [id number] [phone] phone [phone] fax

## 2014-05-28 NOTE — Progress Notes (Signed)
I have called to schedule patient CTA of chest, this should be done this week. He is scheduled for this on Friday Nov 20th at [facility]. He has had BUN/ Creatinine today and this will be available prior to his scan. I have asked Toni AmendCourtney in our referrals department to get prior authorization for the scan. I have assisted Toni AmendCourtney with the prior authorization process,  Clinical scenario was asked, and we have chosen Pulmonary Embolism from the list, since this is what Dr Katrinka BlazingSmith is ruling out with the CTA, there however was no place to put a comment that this is a suspected Pulmonary embolism. I want to document this in case there is any question from the insurance company regarding the indication for the study.

## 2014-05-28 NOTE — Patient Instructions (Signed)

## 2014-05-28 NOTE — Progress Notes (Signed)
   Subjective:    Patient ID: [name], male    DOB: 11/18/1960, 53 y.o.   MRN: [id number]  HPI    Review of Systems  Constitutional: Negative.   HENT: Positive for ear pain and hearing loss.   Eyes: Negative.   Respiratory: Positive for shortness of breath.   Cardiovascular: Negative.   Gastrointestinal: Negative.   Endocrine: Negative.   Genitourinary: Negative.   Musculoskeletal: Positive for arthralgias.  Skin: Negative.   Allergic/Immunologic: Positive for environmental allergies.  Neurological: Positive for headaches.  Hematological: Negative.   Psychiatric/Behavioral: Negative.        Objective:   Physical Exam        Assessment & Plan:

## 2014-05-29 LAB — COMPLETE METABOLIC PANEL WITH GFR
ALBUMIN: 4 g/dL (ref 3.5–5.2)
ALT: 17 U/L (ref 0–53)
AST: 20 U/L (ref 0–37)
Alkaline Phosphatase: 73 U/L (ref 39–117)
BUN: 19 mg/dL (ref 6–23)
CALCIUM: 9.4 mg/dL (ref 8.4–10.5)
CHLORIDE: 98 meq/L (ref 96–112)
CO2: 26 meq/L (ref 19–32)
Creat: 0.74 mg/dL (ref 0.50–1.35)
GFR, Est African American: >89 mL/min
GLUCOSE: 152 mg/dL — AB (ref 70–99)
POTASSIUM: 4.8 meq/L (ref 3.5–5.3)
Sodium: 137 mEq/L (ref 135–145)
Total Bilirubin: 0.5 mg/dL (ref 0.2–1.2)
Total Protein: 7.4 g/dL (ref 6.0–8.3)

## 2014-05-29 LAB — TSH: TSH: 3.422 u[IU]/mL (ref 0.350–4.500)

## 2014-05-29 LAB — LIPID PANEL
CHOLESTEROL: 157 mg/dL (ref 0–200)
HDL: 43 mg/dL (ref >39)
LDL Cholesterol: 92 mg/dL (ref 0–99)
Total CHOL/HDL Ratio: 3.7 Ratio
Triglycerides: 109 mg/dL (ref <150)
VLDL: 22 mg/dL (ref 0–40)

## 2014-05-29 LAB — PSA: PSA: 0.26 ng/mL (ref <=4.00)

## 2014-05-29 LAB — MICROALBUMIN, URINE: MICROALB UR: 0.5 mg/dL (ref <2.0)

## 2014-05-30 ENCOUNTER — Ambulatory Visit (HOSPITAL_COMMUNITY)
Admission: RE | Admit: 2014-05-30 | Discharge: 2014-05-30 | Disposition: A | Discharge status: Home / Self Care | Payer: BC Managed Care – PPO | Source: Ambulatory Visit | Attending: Family Medicine | Admitting: Family Medicine

## 2014-05-30 ENCOUNTER — Encounter (HOSPITAL_COMMUNITY): Payer: Self-pay

## 2014-05-30 DIAGNOSIS — R0600 Dyspnea, unspecified: Secondary | ICD-10-CM

## 2014-05-30 DIAGNOSIS — R0609 Other forms of dyspnea: Secondary | ICD-10-CM

## 2014-05-30 DIAGNOSIS — R0602 Shortness of breath: Secondary | ICD-10-CM | POA: Diagnosis not present

## 2014-05-30 MED ORDER — IOHEXOL 350 MG/ML SOLN
80.0000 mL | Freq: Once | INTRAVENOUS | Status: AC | PRN
  Administered 2014-05-30: 80 mL via INTRAVENOUS

## 2014-06-07 DIAGNOSIS — I5030 Unspecified diastolic (congestive) heart failure: Secondary | ICD-10-CM

## 2014-06-17 ENCOUNTER — Encounter: Payer: Self-pay | Admitting: Internal Medicine

## 2014-06-17 ENCOUNTER — Other Ambulatory Visit (INDEPENDENT_AMBULATORY_CARE_PROVIDER_SITE_OTHER): Payer: BC Managed Care – PPO

## 2014-06-17 ENCOUNTER — Ambulatory Visit (INDEPENDENT_AMBULATORY_CARE_PROVIDER_SITE_OTHER): Payer: BC Managed Care – PPO | Admitting: Internal Medicine

## 2014-06-17 VITALS — BP 120/78 | HR 90 | Ht 73.0 in | Wt 329.0 lb

## 2014-06-17 DIAGNOSIS — I10 Essential (primary) hypertension: Secondary | ICD-10-CM

## 2014-06-17 DIAGNOSIS — R0600 Dyspnea, unspecified: Secondary | ICD-10-CM

## 2014-06-17 DIAGNOSIS — R0609 Other forms of dyspnea: Secondary | ICD-10-CM

## 2014-06-17 LAB — BASIC METABOLIC PANEL
BUN: 27 mg/dL — AB (ref 6–23)
CO2: 28 mEq/L (ref 19–32)
Calcium: 9.1 mg/dL (ref 8.4–10.5)
Chloride: 99 mEq/L (ref 96–112)
Creatinine, Ser: 1 mg/dL (ref 0.4–1.5)
GFR: 83.36 mL/min (ref >60.00)
Glucose, Bld: 290 mg/dL — ABNORMAL HIGH (ref 70–99)
POTASSIUM: 4.5 meq/L (ref 3.5–5.1)
SODIUM: 134 meq/L — AB (ref 135–145)

## 2014-06-17 MED ORDER — VALSARTAN 160 MG PO TABS: 160.0000 mg | ORAL_TABLET | Freq: Every day | ORAL | Status: DC

## 2014-06-17 NOTE — Progress Notes (Signed)
Subjective:    Patient ID: [name], male    DOB: 12/18/1960  MRN: [id number]  HPI  [age] yowm minimal smoking quit age [age] with morbid obesity peak wt  398 march 2011 > lap band then lost down to 310 then started trending back up some but around Spring / summer 2015 noted variable doe with wt aorund 220 > LHC 04/02/14 with LVEDP 22 by Dr Katrinka BlazingSmith > rec add aldactone  But no better so referred to pulmonary clinic 06/17/2014 by Dr Nilda SimmerKristi Smith   06/17/2014 1st [facility] office visit/ Wert  / on ACei  Chief Complaint  Patient presents with  . Pulmonary Consult    Referred by Dr. Nilda SimmerKristi Smith for eval of abnormal ct chest and SOB. Pt states having SOB for the past 6 months- notices this with lifting things and while he is working.   no regular ex routine but variable intol to exercise but still sometimes can do [employer]/   wife notices sitting still "breathing noisy", ok on cpap.  No obvious day to day or daytime variabilty or assoc chronic cough or cp or chest tightness, subjective wheeze overt sinus or hb symptoms. No unusual exp hx or h/o childhood pna/ asthma or knowledge of premature birth.  Sleeping ok without nocturnal  or early am exacerbation  of respiratory  c/o's or need for noct saba. Also denies any obvious fluctuation of symptoms with weather or environmental changes or other aggravating or alleviating factors except as outlined above   Current Medications, Allergies, Complete Past Medical History, Past Surgical History, Family History, and Social History were reviewed in [facility] electronic medical record.          Review of Systems  Constitutional: Negative for fever, chills, activity change, appetite change and unexpected weight change.  HENT: Positive for congestion and dental problem. Negative for postnasal drip, rhinorrhea, sneezing, sore throat, trouble swallowing and voice change.   Eyes: Negative for visual disturbance.  Respiratory: Positive for  shortness of breath. Negative for cough and choking.   Cardiovascular: Negative for chest pain and leg swelling.  Gastrointestinal: Negative for nausea, vomiting and abdominal pain.  Genitourinary: Negative for difficulty urinating.  Musculoskeletal: Positive for arthralgias.  Skin: Negative for rash.  Psychiatric/Behavioral: Negative for behavioral problems and confusion.       Objective:   Physical Exam  Pleasant amb wm nad  Wt Readings from Last 3 Encounters:  06/17/14 329 lb (149.233 kg)  05/28/14 315 lb 12.8 oz (143.246 kg)  05/16/14 326 lb (147.873 kg)    Vital signs reviewed   HEENT: nl dentition, turbinates, and orophanx. Nl external ear canals without cough reflex   NECK :  without JVD/Nodes/TM/ nl carotid upstrokes bilaterally   LUNGS: no acc muscle use, clear to A and P bilaterally without cough on insp or exp maneuvers   CV:  RRR  no s3 or murmur or increase in P2, no edema   ABD:  soft and nontender with nl excursion in the supine position. No bruits or organomegaly, bowel sounds nl  MS:  warm without deformities, calf tenderness, cyanosis or clubbing  SKIN: warm and dry without lesions    NEURO:  alert, approp, no deficits    Chemistry       Component Value Date/Time   CALCIUM 9.4 05/28/2014 1411   ALKPHOS 73 05/28/2014 1411   AST 20 05/28/2014 1411   ALT 17 05/28/2014 1411   BILITOT 0.5 05/28/2014 1411  Lab Results  Component Value Date   WBC 6.2 05/28/2014   HGB 14.7 05/28/2014   HCT 42.3 05/28/2014   MCV 85.6 05/28/2014   PLT 296 05/28/2014     Lab Results  Component Value Date   TSH 3.422 05/28/2014     Lab Results  Component Value Date   PROBNP 94.3 03/01/2014           Assessment & Plan:

## 2014-06-17 NOTE — Patient Instructions (Addendum)
Stop altace and take diovan 160 mg one daily in its place and your breathing should gradually improve over the next several weeks   Please remember to go to the lab   department downstairs for your tests - we will call you with the results when they are available.    Please schedule a follow up office visit in 4 weeks, sooner if needed with PFTs

## 2014-06-17 NOTE — Assessment & Plan Note (Signed)
ACE inhibitors are problematic in  pts with airway complaints because  even experienced pulmonologists can't always distinguish ace effects from copd/asthma.  By themselves they don't actually cause a problem, much like oxygen can't by itself start a fire, but they certainly serve as a powerful catalyst or enhancer for any "fire"  or inflammatory process in the upper airway, be it caused by an ET  tube or more commonly reflux (especially in the obese or pts with known GERD or who are on biphoshonates).    In the era of ARB near equivalency until we have a better handle on the reversibility of the airway problem, it just makes sense to avoid ACEI  entirely in the short run and then decide later, having established a level of airway control using a reasonable limited regimen, whether to add back ace but even then being very careful to observe the pt for worsening airway control and number of meds used/ needed to control symptoms.    For now try diovan 160 mg one daily after recheck K as had contrast scan in meantime and is on aldactone (advised not not change dietary K on this combination

## 2014-06-17 NOTE — Assessment & Plan Note (Addendum)
-   CTa 05/30/14 No evidence for an acute pulmonary embolism. Subtle ground-glass densities in the lungs are probably related to atelectasis. No large areas of airspace disease or consolidation. - 06/17/2014  Walked RA x 3 laps @ 185 ft each stopped due to end of study, no sob, nl pace, no desat   Symptoms are markedly disproportionate to objective findings and not clear this is a lung problem but pt does appear to have difficult airway management issues.  DDX of  difficult airways management all start with A and  include Adherence, Ace Inhibitors, Acid Reflux, Active Sinus Disease, Alpha 1 Antitripsin deficiency, Anxiety masquerading as Airways dz,  ABPA,  allergy(esp in young), Aspiration (esp in elderly), Adverse effects of DPI,  Active smokers, plus two Bs  = Bronchiectasis and Beta blocker use..and one C= CHF   Adherence is always the initial "prime suspect" and is a multilayered concern that requires a "trust but verify" approach in every patient - starting with knowing how to use medications, especially inhalers, correctly, keeping up with refills and understanding the fundamental difference between maintenance and prns vs those medications only taken for a very short course and then stopped and not refilled.   ACEi top of the usual list based on wife hearing noisy breathing and the variability in doe as well as elimination on CPAP  Acid reflux does the same thing, and he is at risk due to obesity, but will try off acei first (see hbp)  CHF > note elevated LVEDP but neg response to aldactone still doesn't exclude it and bnp may be falsely low in MO pts.

## 2014-06-18 NOTE — Progress Notes (Signed)
Quick Note:  Spoke with pt and notified of results per Dr. Wert. Pt verbalized understanding and denied any questions.  ______ 

## 2014-06-19 ENCOUNTER — Encounter (HOSPITAL_COMMUNITY): Payer: Self-pay | Admitting: Interventional Cardiology

## 2014-07-14 ENCOUNTER — Other Ambulatory Visit: Payer: Self-pay | Admitting: Internal Medicine

## 2014-07-14 DIAGNOSIS — R0600 Dyspnea, unspecified: Secondary | ICD-10-CM

## 2014-07-15 ENCOUNTER — Ambulatory Visit (INDEPENDENT_AMBULATORY_CARE_PROVIDER_SITE_OTHER): Payer: BC Managed Care – PPO | Admitting: Internal Medicine

## 2014-07-15 ENCOUNTER — Encounter: Payer: Self-pay | Admitting: Internal Medicine

## 2014-07-15 VITALS — BP 112/72 | HR 94 | Ht 74.0 in | Wt 320.0 lb

## 2014-07-15 DIAGNOSIS — E669 Obesity, unspecified: Secondary | ICD-10-CM

## 2014-07-15 DIAGNOSIS — R0600 Dyspnea, unspecified: Secondary | ICD-10-CM

## 2014-07-15 DIAGNOSIS — R0609 Other forms of dyspnea: Secondary | ICD-10-CM

## 2014-07-15 DIAGNOSIS — I10 Essential (primary) hypertension: Secondary | ICD-10-CM

## 2014-07-15 LAB — PULMONARY FUNCTION TEST
DL/VA % pred: 99 %
DL/VA: 4.83 ml/min/mmHg/L
DLCO UNC % PRED: 82 %
DLCO unc: 30.95 ml/min/mmHg
FEF 25-75 Post: 4.34 L/sec
FEF 25-75 Pre: 3.25 L/sec
FEF2575-%Change-Post: 33 %
FEF2575-%PRED-POST: 120 %
FEF2575-%Pred-Pre: 90 %
FEV1-%Change-Post: 6 %
FEV1-%PRED-PRE: 82 %
FEV1-%Pred-Post: 87 %
FEV1-Post: 3.77 L
FEV1-Pre: 3.53 L
FEV1FVC-%Change-Post: 4 %
FEV1FVC-%PRED-PRE: 103 %
FEV6-%Change-Post: 3 %
FEV6-%PRED-POST: 83 %
FEV6-%PRED-PRE: 81 %
FEV6-Post: 4.5 L
FEV6-Pre: 4.37 L
FEV6FVC-%CHANGE-POST: 1 %
FEV6FVC-%PRED-POST: 104 %
FEV6FVC-%Pred-Pre: 102 %
FVC-%Change-Post: 1 %
FVC-%Pred-Post: 80 %
FVC-%Pred-Pre: 78 %
FVC-POST: 4.5 L
FVC-Pre: 4.42 L
POST FEV6/FVC RATIO: 100 %
PRE FEV6/FVC RATIO: 99 %
Post FEV1/FVC ratio: 84 %
Pre FEV1/FVC ratio: 80 %
RV % PRED: 56 %
RV: 1.34 L
TLC % pred: 76 %
TLC: 5.95 L

## 2014-07-15 MED ORDER — FAMOTIDINE 20 MG PO TABS: ORAL_TABLET | ORAL | Status: DC

## 2014-07-15 MED ORDER — PANTOPRAZOLE SODIUM 40 MG PO TBEC: 40.0000 mg | DELAYED_RELEASE_TABLET | Freq: Every day | ORAL | Status: DC

## 2014-07-15 NOTE — Progress Notes (Signed)
Subjective:    Patient ID: [name], male    DOB: 07/17/1960  MRN: [id number]    Brief patient profile:  54 yowm minimal smoking quit age 54 with morbid obesity peak wt  398 march 2011 > lap band then lost down to 310 then started trending back up some but around Spring / summer 2015 noted variable doe with wt aorund 320 > LHC 04/02/14 with LVEDP 22 by Dr Katrinka Blazing > rec add aldactone  But no better so referred to pulmonary clinic 06/17/2014 by Dr Nilda Simmer with nl pfts except for low ERV 07/15/14    History of Present Illness  06/17/2014 1st [place] [facility] office visit/ [name]  / on ACei  Chief Complaint  Patient presents with  . Pulmonary Consult    Referred by Dr. Nilda Simmer for eval of abnormal ct chest and SOB. Pt states having SOB for the past 6 months- notices this with lifting things and while he is working.   no regular ex routine but variable intol to exercise but still sometimes can do [employer]/   wife notices sitting still "breathing noisy", ok on cpap. rec Stop altace and take diovan 160 mg one daily in its place and your breathing should gradually improve over the next several weeks  Please remember to go to the lab  department downstairs for your tests - we will call you with the results when they are available. Please schedule a follow up office visit in 4 weeks, sooner if needed with PFTs    07/15/2014 f/u ov/[name] re: sob with nl pfts x for low erv/ off acei x one month Chief Complaint  Patient presents with  . Follow-up    PFT done today. Pt states that his breathing has improved some since the last visit. No new co's today.    very physically active at work and doing some better with ex tol, still some "noisy breathing" at hs until puts on cpap then resolves    No obvious day to day or daytime variabilty or assoc chronic cough or cp or chest tightness, subjective wheeze overt sinus or hb symptoms. No unusual exp hx or h/o childhood pna/ asthma or knowledge of  premature birth.  Sleeping ok without nocturnal  or early am exacerbation  of respiratory  c/o's or need for noct saba. Also denies any obvious fluctuation of symptoms with weather or environmental changes or other aggravating or alleviating factors except as outlined above   Current Medications, Allergies, Complete Past Medical History, Past Surgical History, Family History, and Social History were reviewed in [employer] [facility] record.  ROS  The following are not active complaints unless bolded sore throat, dysphagia, dental problems, itching, sneezing,  nasal congestion or excess/ purulent secretions, ear ache,   fever, chills, sweats, unintended wt loss, pleuritic or exertional cp, hemoptysis,  orthopnea pnd or leg swelling, presyncope, palpitations, heartburn, abdominal pain, anorexia, nausea, vomiting, diarrhea  or change in bowel or urinary habits, change in stools or urine, dysuria,hematuria,  rash, arthralgias, visual complaints, headache, numbness weakness or ataxia or problems with walking or coordination,  change in mood/affect or memory.           Objective:   Physical Exam  Pleasant amb wm nad  07/15/2014          321  Wt Readings from Last 3 Encounters:  06/17/14 329 lb (149.233 kg)  05/28/14 315 lb 12.8 oz (143.246 kg)  05/16/14 326 lb (147.873 kg)  Vital signs reviewed   HEENT: nl dentition, turbinates, and orophanx. Nl external ear canals without cough reflex   NECK :  without JVD/Nodes/TM/ nl carotid upstrokes bilaterally   LUNGS: no acc muscle use, clear to A and P bilaterally without cough on insp or exp maneuvers   CV:  RRR  no s3 or murmur or increase in P2, no edema   ABD:  soft and nontender with nl excursion in the supine position. No bruits or organomegaly, bowel sounds nl  MS:  warm without deformities, calf tenderness, cyanosis or clubbing  SKIN: warm and dry without lesions    NEURO:  alert, approp, no deficits   Lab Results    Component Value Date   WBC 6.2 05/28/2014   HGB 14.7 05/28/2014   HCT 42.3 05/28/2014   MCV 85.6 05/28/2014   PLT 296 05/28/2014     Lab Results  Component Value Date   TSH 3.422 05/28/2014     Lab Results  Component Value Date   PROBNP 94.3 03/01/2014           Assessment & Plan:

## 2014-07-15 NOTE — Patient Instructions (Addendum)
Weight control is simply a matter of calorie balance which needs to be tilted in your favor by eating less and exercising more.  To get the most out of exercise, you need to be continuously aware that you are short of breath, but never out of breath, for 30 minutes daily. As you improve, it will actually be easier for you to do the same amount of exercise  in  30 minutes so always push to the level where you are short of breath.  If this does not result in gradual weight reduction then I strongly recommend you see a nutritionist with a food diary x 2 weeks so that we can work out a negative calorie balance which is universally effective in steady weight loss programs.  Think of your calorie balance like you do your bank account where in this case you want the balance to go down so you must take in less calories than you burn up.  It's just that simple:  Hard to do, but easy to understand.  Good luck!   Pantoprazole (protonix) 40 mg   Take 30-60 min before first meal of the day and Pepcid 20 mg one bedtime x one month then stop and see how you feel   GERD (REFLUX)  is an extremely common cause of respiratory symptoms just like yours , many times with no obvious heartburn at all.    It can be treated with medication, but also with lifestyle changes including avoidance of late meals, excessive alcohol, smoking cessation, and avoid fatty foods, chocolate, peppermint, colas, red wine, and acidic juices such as orange juice.  NO MINT OR MENTHOL PRODUCTS SO NO COUGH DROPS  USE SUGARLESS CANDY INSTEAD (Jolley ranchers or Stover's or Life Savers) or even ice chips will also do - the key is to swallow to prevent all throat clearing. NO OIL BASED VITAMINS - use powdered substitutes.     If you are satisfied with your treatment plan,  let your doctor know and he/she can either refill your medications or you can return here when your prescription runs out.     If in any way you are not 100% satisfied,  please  tell us.  If 100% better, tell your friends!  Pulmonary follow up is as needed

## 2014-07-15 NOTE — Progress Notes (Signed)
PFT done today. 

## 2014-07-16 ENCOUNTER — Encounter: Payer: Self-pay | Admitting: Internal Medicine

## 2014-07-16 NOTE — Assessment & Plan Note (Signed)
S/p lap band surgery 09/2009 [name] MurphyMatthew [name]. - PFTs 07/15/14 with low erv only, c/w with effects of obesity   I had an extended discussion with the patient reviewing all relevant studies completed to date and  lasting 15 to 20 minutes of a 25 minute visit on the following ongoing concerns:  1) reviewed pfts 2) reviewed cal bal issues   Pulmonary f/u prn

## 2014-07-16 NOTE — Assessment & Plan Note (Addendum)
Goal BP<130/80 due to DMII. - changed acei to ARB 06/17/14 due to ? Pseudoasthma > improved 07/15/14> no change rx = diovan 160 mg one daily

## 2014-07-16 NOTE — Assessment & Plan Note (Signed)
-   CTa 05/30/14 No evidence for an acute pulmonary embolism. Subtle ground-glass densities in the lungs are probably related to atelectasis. No large areas of airspace disease or consolidation. - 06/17/2014  Walked RA x 3 laps @ 185 ft each stopped due to end of study, no sob, nl pace, no desat,    - trial off acei  06/17/2014 > improved  - PFTs 07/15/14 c/w effects of obesity only   It's only been a month off altace, the most highly tissue bound acei, but already improving except for some upper airway "wheezing" his wife notes at night resolves on cpap so could be gerd related > try rx x one month and work on wt > pulmonary f/u prn

## 2014-08-27 ENCOUNTER — Ambulatory Visit (INDEPENDENT_AMBULATORY_CARE_PROVIDER_SITE_OTHER): Payer: BC Managed Care – PPO | Admitting: Family Medicine

## 2014-08-27 ENCOUNTER — Encounter: Payer: Self-pay | Admitting: Family Medicine

## 2014-08-27 VITALS — BP 143/84 | HR 83 | Temp 98.0°F | Resp 16 | Ht 73.0 in | Wt 320.4 lb

## 2014-08-27 DIAGNOSIS — E119 Type 2 diabetes mellitus without complications: Secondary | ICD-10-CM

## 2014-08-27 DIAGNOSIS — E785 Hyperlipidemia, unspecified: Secondary | ICD-10-CM

## 2014-08-27 DIAGNOSIS — K219 Gastro-esophageal reflux disease without esophagitis: Secondary | ICD-10-CM

## 2014-08-27 DIAGNOSIS — I10 Essential (primary) hypertension: Secondary | ICD-10-CM

## 2014-08-27 DIAGNOSIS — R0602 Shortness of breath: Secondary | ICD-10-CM

## 2014-08-27 LAB — COMPREHENSIVE METABOLIC PANEL
ALBUMIN: 3.7 g/dL (ref 3.5–5.2)
ALK PHOS: 65 U/L (ref 39–117)
ALT: 14 U/L (ref 0–53)
AST: 12 U/L (ref 0–37)
BILIRUBIN TOTAL: 0.4 mg/dL (ref 0.2–1.2)
BUN: 17 mg/dL (ref 6–23)
CHLORIDE: 99 meq/L (ref 96–112)
CO2: 26 mEq/L (ref 19–32)
Calcium: 8.8 mg/dL (ref 8.4–10.5)
Creat: 0.84 mg/dL (ref 0.50–1.35)
Glucose, Bld: 528 mg/dL (ref 70–99)
Potassium: 4.9 mEq/L (ref 3.5–5.3)
Sodium: 134 mEq/L — ABNORMAL LOW (ref 135–145)
TOTAL PROTEIN: 6.8 g/dL (ref 6.0–8.3)

## 2014-08-27 LAB — CBC WITH DIFFERENTIAL/PLATELET
BASOS ABS: 0.1 10*3/uL (ref 0.0–0.1)
BASOS PCT: 1 % (ref 0–1)
EOS PCT: 3 % (ref 0–5)
Eosinophils Absolute: 0.2 10*3/uL (ref 0.0–0.7)
HCT: 38.6 % — ABNORMAL LOW (ref 39.0–52.0)
Hemoglobin: 12.6 g/dL — ABNORMAL LOW (ref 13.0–17.0)
Lymphocytes Relative: 33 % (ref 12–46)
Lymphs Abs: 2.5 10*3/uL (ref 0.7–4.0)
MCH: 29.2 pg (ref 26.0–34.0)
MCHC: 32.6 g/dL (ref 30.0–36.0)
MCV: 89.4 fL (ref 78.0–100.0)
MPV: 9.9 fL (ref 8.6–12.4)
Monocytes Absolute: 0.5 10*3/uL (ref 0.1–1.0)
Monocytes Relative: 6 % (ref 3–12)
NEUTROS PCT: 57 % (ref 43–77)
Neutro Abs: 4.3 10*3/uL (ref 1.7–7.7)
PLATELETS: 320 10*3/uL (ref 150–400)
RBC: 4.32 MIL/uL (ref 4.22–5.81)
RDW: 13.7 % (ref 11.5–15.5)
WBC: 7.6 10*3/uL (ref 4.0–10.5)

## 2014-08-27 LAB — LIPID PANEL
Cholesterol: 142 mg/dL (ref 0–200)
HDL: 35 mg/dL — AB (ref >39)
LDL CALC: 71 mg/dL (ref 0–99)
TRIGLYCERIDES: 182 mg/dL — AB (ref <150)
Total CHOL/HDL Ratio: 4.1 Ratio
VLDL: 36 mg/dL (ref 0–40)

## 2014-08-27 LAB — POCT GLYCOSYLATED HEMOGLOBIN (HGB A1C): Hemoglobin A1C: 11.9

## 2014-08-27 NOTE — Patient Instructions (Addendum)
Take 10 units of Novolog 3 times daily before meal times.   Exercise improves every system in the body.  It lowers the risk of heart disease, decreases blood pressure, reduces the symptoms of depression and anxiety, and lowers blood sugar. To receive these benefits, try to get 150 minutes of planned exercise each week.  You can break this 150 minutes up however you like.  For instance, you can perform 30 minutes of brisk walking 5 days a week, or perform 50 minutes 3 days a week.  If you don't like walking, or can't find a safe place to walk, find another way to move that you can enjoy.  Exercise tapes, cycling, stair climbing, swimming, or a combination will be just as good as a walking program. To ensure the proper intensity, you can use the talk test. Essentially, you should be able to carry on a conversation, but you should have to take short breaks from the conversation in order catch your breath.

## 2014-08-27 NOTE — Progress Notes (Signed)
08/28/2014 at 10:10 AM  [name] / DOB: 12/21/1960 / MRN: [id number]  The patient has Dyslipidemia; Obesity; HTN (hypertension); GASTROPARESIS; History of laparoscopic adjustable gastric banding; DM2 (diabetes mellitus, type 2); Flu vaccine need; Hearing loss; Generalized anxiety disorder; Abnormal finding on cardiovascular stress test; Sleep apnea; DOE (dyspnea on exertion); Family history of coronary artery disease in father; Chronic diastolic CHF (congestive heart failure); CAD (coronary artery disease); and CHF with left ventricular diastolic dysfunction, NYHA class 1 on his problem list.  SUBJECTIVE  Chief compalaint: Diabetes; Hypertension; Depression; and Hyperlipidemia   History of present illness: [name] is 54 y.o. well appearing male presenting for a diabetic follow-up.  He reports having rare episodes of hypoglycemia, with the most recent being at night 3-4 weeks ago, and was able to treat himself accordingly.  He reports sugars of 60-70 at these times. His fasting sugars typically run 120-150 in the morning and typically runs around 250 to 300 after su[per. He reports "I should be checking my sugar more often." He has attendning DM EDU in the past and feels that he has enough information with regards to proper foods.  He does admit his diet could be better while working however.  He denies sock and glove paresthesia, but does report some leg numbness 2/2 lumbar back operations, but this has not changed.  He denies nocturia.  He denies SOB, DOE, and presycope and chest pain. He has received his annual flu shot, and reports receiving the pneumovax in the last few years.  Administering Lantus 76 units daily; also administering Novolog 4-5 units before breakfast some mornings and before supper most nights; no lunchtime insulin; did not increase Novolog to 10 units as recommended at last visit.  S/p CT angio after last visit due to shortness of breath; CT angio negative for pulmonary  embolism.  S/p recent cardiac evaluation with work up and no explanation for shortness of breath; thus, patient was referred to pulmonology.  Protonix and Pepcid started by pulmonology for GERD symptoms.  Ramipril also switched to Valsartan due to concern of dry cough and potential reactive airway response to ACE I.  SOB has resolved in past month with these treatment changes.  Feeling much better.  Not checking blood pressure at home.    Emotionally stable on Prozac 20mg  daily.  Planning to downsize and move soon.  Continues to care for elderly mother and elderly mother-in-law. Daughter still battling Crohn's disease and recently struggled with lower back pain.  Grandchildren bring patient happiness.  Plans to retire in three years; wife also plans to retire at that time.  Denies SI.   He  has a past medical history of Diabetes mellitus; Hyperlipidemia; Hypertension; Impotence of organic origin; Obesity, unspecified; Allergic rhinitis, cause unspecified; Other chronic allergic conjunctivitis; Obstructive sleep apnea (adult) (pediatric); Degeneration of intervertebral disc, site unspecified; and Personal history of colonic polyps.    He has a current medication list which includes the following prescription(s): aspirin ec, b-d ins syr ultrafine 1cc/31g, calcium-magnesium-vitamin d, cyclobenzaprine, famotidine, fluoxetine, insulin glargine, insulin syringe-needle u-100, metformin, multivitamin, novolog flexpen, pantoprazole, sildenafil, simvastatin, spironolactone, and valsartan.  [name] is allergic to diclofenac sodium and voltaren. He  reports that he has quit smoking. His smoking use included Cigarettes. He has a .5 pack-year smoking history. He has never used smokeless tobacco. He reports that he drinks alcohol. He reports that he does not use illicit drugs. He  reports that he currently engages in sexual activity. The patient  has past surgical history that includes Laparoscopic gastric banding  (09/15/09); Back surgery; Quadriceps tendon repair; Quadriceps repair; Rotator cuff repair; Appendectomy; Tonsillectomy and adenoidectomy (1960); arthroscopy of knee (1980); Colonoscopy (07/11/2009); Allergy Consult (07/11/2010); GI consult (07/11/2010); Sleep study (07/11/2010); admission (01/2009); Spine surgery; and left heart catheterization with coronary angiogram (N/A, 04/02/2014).  His family history includes Arthritis in his mother; Cancer in his father, maternal uncle, and paternal grandfather; Depression in his sister; Diabetes in his father, mother, and sister; Heart disease (age of onset: 16) in his father; Hyperlipidemia in his father; Hypertension in his father; Kidney failure in his mother; Parkinson's disease in his father; Peripheral Artery Disease in his father.  Review of Systems  Constitutional: Negative.  Negative for fever, weight loss, malaise/fatigue and diaphoresis.  Respiratory: Negative for cough, sputum production, shortness of breath and wheezing.   Cardiovascular: Negative for chest pain, palpitations, orthopnea and leg swelling.  Gastrointestinal: Negative for heartburn, nausea, vomiting, abdominal pain, diarrhea, constipation, blood in stool and melena.  Genitourinary: Negative for dysuria, urgency and frequency.  Musculoskeletal: Negative for myalgias.  Skin: Negative for itching and rash.  Neurological: Negative for dizziness, tingling, tremors, sensory change, speech change, focal weakness, loss of consciousness, weakness and headaches.  Psychiatric/Behavioral: Negative for depression and suicidal ideas. The patient is not nervous/anxious and does not have insomnia.     OBJECTIVE  His  height is  (1.854 m) and weight is 320 lb 6.4 oz (145.332 kg). His oral temperature is 98 F (36.7 C). His blood pressure is 143/84 and his pulse is 83. His respiration is 16 and oxygen saturation is 96%.  The patient's body mass index is 42.28 kg/(m^2).  Physical Exam  Vitals  reviewed. Constitutional: He is oriented to person, place, and time. He appears well-developed and well-nourished. No distress.  Morbidly obese.  HENT:  Head: Normocephalic and atraumatic.  Right Ear: External ear normal.  Left Ear: External ear normal.  Nose: Nose normal.  Mouth/Throat: Oropharynx is clear and moist.  Eyes: Conjunctivae and EOM are normal. Pupils are equal, round, and reactive to light.  Neck: Normal range of motion. Neck supple. No thyromegaly present.  Cardiovascular: Normal rate, regular rhythm and normal heart sounds.  Exam reveals no gallop and no friction rub.   No murmur heard. Respiratory: Effort normal and breath sounds normal. No respiratory distress. He has no wheezes. He has no rales.  GI: Soft. Bowel sounds are normal. He exhibits no distension and no mass. There is no tenderness. There is no rebound and no guarding.  Musculoskeletal: Normal range of motion.  Lymphadenopathy:    He has no cervical adenopathy.  Neurological: He is alert and oriented to person, place, and time. He has normal reflexes. No cranial nerve deficit. He exhibits normal muscle tone. Coordination normal.  Skin: Skin is warm and dry. No rash noted. He is not diaphoretic.  Psychiatric: He has a normal mood and affect. His behavior is normal. Judgment and thought content normal.   Diabetic Foot Exam - Simple   Simple Foot Form  Diabetic Foot exam was performed with the following findings:  Yes 08/27/2014  4:12 PM  Visual Inspection: Negative for lesions  Sensation Testing: Positive for decreased sensation on the right third digit and left third and fifth digit.  Pulse Check: DP PT pulses 2+ bilaterally  Comments        ASSESSMENT & PLAN  [name] was seen today for diabetes and hypertension.  Diagnoses and all orders for this  visit:  Type 2 diabetes mellitus without complication: Patient in poor control, which appears to be historical.  He was advised at last visit to check his  sugar after each meal and to take short acting 10 units prior to meals with non-compliance. Given today's information, patient was advised to take 10 units of short acting daily at breakfast, lunch, and dinner. Should this plan fail to bring his A1C to acceptable levels, consider inhaled insulin.   Orders: -     HM Diabetes Foot Exam -     POCT glycosylated hemoglobin (Hb A1C) -     Comprehensive metabolic panel  Essential hypertension, benign: Pressure mildly elevated today.  Will hold on changing therapy at this time as his pressures have been good recently.   Orders: -     CBC with Differential/Platelet -     Comprehensive metabolic panel  Hyperlipidemia:  Controlled currently; obtain labs; continue medications. Orders: -     Lipid panel   GERD:  New.  Controlled with Protonix and Pepcid; no changes to management.    Shortness of breath: resolved/controlled with treating GERD and stopping ACE-I.    S/p pulmonology consultation; s/p CT angio.   The patient was advised to call or come back to clinic if he does not see an improvement in symptoms, or worsens with the above plan.   Jrake Rodriquez Paulita Fujita, M.D. [facility] [address] [place], [place]  [phone] phone [phone] fax

## 2014-08-28 ENCOUNTER — Telehealth: Payer: Self-pay

## 2014-08-28 ENCOUNTER — Ambulatory Visit (INDEPENDENT_AMBULATORY_CARE_PROVIDER_SITE_OTHER): Payer: BC Managed Care – PPO | Admitting: Family Medicine

## 2014-08-28 ENCOUNTER — Encounter: Payer: Self-pay | Admitting: Family Medicine

## 2014-08-28 DIAGNOSIS — E119 Type 2 diabetes mellitus without complications: Secondary | ICD-10-CM

## 2014-08-28 LAB — GLUCOSE, POCT (MANUAL RESULT ENTRY): POC GLUCOSE: 416 mg/dL — AB (ref 70–99)

## 2014-08-28 NOTE — Telephone Encounter (Signed)
Thank you this sounds like a great plan

## 2014-08-28 NOTE — Telephone Encounter (Signed)
Baruch MerlMichael, [facility] called to report critical high glucose of 528

## 2014-08-28 NOTE — Telephone Encounter (Signed)
Patient advised to report to 104 given [facility]. On the phone patient denies symptoms, states he has increased his mealtime insulin per Dr. Michaelle CopasSmith's instructions, to Novolog 10 units before meals.  He increased this dose last night with supper and this morning with breakfast.  BG glucose at 416 at 104.  Patient called back by me and asked to trend his blood sugars for the next 5 days (fasting and 30 minutes after meals tid) and provide this information to me or Dr. Katrinka BlazingSmith.  Deliah BostonMichael Brice Potteiger, MS, PA-C   11:24 AM, 08/28/2014

## 2014-08-29 ENCOUNTER — Encounter: Payer: Self-pay | Admitting: Family Medicine

## 2014-08-29 NOTE — Telephone Encounter (Signed)
Noted and reviewed. 

## 2014-08-29 NOTE — Progress Notes (Signed)
Nurse visit only for elevated glucose.  Patient feels well without headache, visual changes.  Sugar of 528 on non-fasting labs yesterday.    Results for orders placed or performed in visit on 08/28/14  POCT glucose (manual entry)  Result Value Ref Range   POC Glucose 416 (A) 70 - 99 mg/dl   A/P:  DMII uncontrolled: repeat glucose today slightly improved; per [name]'s instructions, monitor sugars bid to tid for next 72 hours.  Call with readings.  Continue Novolog 10mg  qac and Lantus 76 units daily.  [name] [name], M.D. [facility]  [place] [address] [place], [place]  [id number] [phone] phone [phone] fax

## 2014-08-31 ENCOUNTER — Other Ambulatory Visit: Payer: Self-pay | Admitting: Family Medicine

## 2014-09-01 ENCOUNTER — Ambulatory Visit (INDEPENDENT_AMBULATORY_CARE_PROVIDER_SITE_OTHER): Payer: BC Managed Care – PPO

## 2014-09-01 ENCOUNTER — Ambulatory Visit (INDEPENDENT_AMBULATORY_CARE_PROVIDER_SITE_OTHER): Payer: BC Managed Care – PPO | Admitting: Internal Medicine

## 2014-09-01 VITALS — BP 130/60 | HR 97 | Temp 98.6°F | Resp 20 | Ht 73.0 in | Wt 325.4 lb

## 2014-09-01 DIAGNOSIS — R05 Cough: Secondary | ICD-10-CM

## 2014-09-01 DIAGNOSIS — R509 Fever, unspecified: Secondary | ICD-10-CM

## 2014-09-01 DIAGNOSIS — E119 Type 2 diabetes mellitus without complications: Secondary | ICD-10-CM

## 2014-09-01 DIAGNOSIS — R079 Chest pain, unspecified: Secondary | ICD-10-CM

## 2014-09-01 DIAGNOSIS — R059 Cough, unspecified: Secondary | ICD-10-CM

## 2014-09-01 DIAGNOSIS — J209 Acute bronchitis, unspecified: Secondary | ICD-10-CM

## 2014-09-01 LAB — POCT CBC
Granulocyte percent: 73.1 %G (ref 37–80)
HCT, POC: 37.6 % — AB (ref 43.5–53.7)
Hemoglobin: 12.5 g/dL — AB (ref 14.1–18.1)
LYMPH, POC: 1 (ref 0.6–3.4)
MCH, POC: 29.6 pg (ref 27–31.2)
MCHC: 33.3 g/dL (ref 31.8–35.4)
MCV: 89 fL (ref 80–97)
MID (CBC): 0.4 (ref 0–0.9)
MPV: 7.3 fL (ref 0–99.8)
POC Granulocyte: 3.8 (ref 2–6.9)
POC LYMPH PERCENT: 19.3 %L (ref 10–50)
POC MID %: 7.6 % (ref 0–12)
Platelet Count, POC: 256 10*3/uL (ref 142–424)
RBC: 4.23 M/uL — AB (ref 4.69–6.13)
RDW, POC: 13.7 %
WBC: 5.2 10*3/uL (ref 4.6–10.2)

## 2014-09-01 LAB — GLUCOSE, POCT (MANUAL RESULT ENTRY): POC GLUCOSE: 210 mg/dL — AB (ref 70–99)

## 2014-09-01 MED ORDER — LEVOFLOXACIN 500 MG PO TABS: 500.0000 mg | ORAL_TABLET | Freq: Every day | ORAL | Status: DC

## 2014-09-01 MED ORDER — BENZONATATE 200 MG PO CAPS: 200.0000 mg | ORAL_CAPSULE | Freq: Two times a day (BID) | ORAL | Status: DC | PRN

## 2014-09-01 NOTE — Patient Instructions (Signed)
Take meds as directed. Increase fluids. Watch your diabetic diet. Levaquin as directed tessalon perle if needed for cough Return for re eval if your symptoms worsen or if you develop new symptoms

## 2014-09-01 NOTE — Progress Notes (Signed)
Subjective:    Patient ID: [name], male    DOB: 1960-08-12, 54 y.o.   MRN: [id number]  HPI 54 year old male with CC 1.cough 2. shortness of breath 3. Fever 4.Achy in shoulders 5.Dizzy   Onset of all 2 days ago, symptoms moderate in severity, cough without sputum, fever of 102 today He did not take it yesterday. Has been dizzy all day today and his sugar has been running above 150 today despite his taking his insulin.He did eat.  He was so short of breath he used his wife's inhaler before coming to the office and did get some relief. He does not have chest pain , no edema. He has been compliant with his med's .  Review of Systems  Constitutional: Positive for fever, chills, activity change, appetite change and fatigue. Negative for diaphoresis and unexpected weight change.  HENT: Positive for congestion and sinus pressure.   Eyes: Negative.   Respiratory: Positive for cough and shortness of breath. Negative for choking, chest tightness, wheezing and stridor.   Gastrointestinal: Negative.   Endocrine: Negative.   Genitourinary: Negative.   Musculoskeletal: Negative.   Skin: Negative.   Allergic/Immunologic: Negative.   Neurological: Negative.   Hematological: Negative.   Psychiatric/Behavioral: Negative.   All other systems reviewed and are negative.      Objective:   Physical Exam  Constitutional: He is oriented to person, place, and time. He appears well-developed and well-nourished.  obese  HENT:  Head: Normocephalic and atraumatic.  Right Ear: External ear normal.  Left Ear: External ear normal.  Nose: Nose normal.  Mouth/Throat: Oropharynx is clear and moist.  Eyes: Conjunctivae and EOM are normal. Pupils are equal, round, and reactive to light.  Neck: Normal range of motion. Neck supple. No JVD present.  Cardiovascular: Normal rate and regular rhythm.  Exam reveals friction rub.   No murmur heard. Pulmonary/Chest: Effort normal. No stridor. No respiratory  distress. He has no wheezes. He has no rales. He exhibits no tenderness.  Course rhonchi bilaterally, no rales no wheezes  Abdominal: Bowel sounds are normal.  Musculoskeletal: Normal range of motion. He exhibits no edema or tenderness.  Lymphadenopathy:    He has no cervical adenopathy.  Neurological: He is alert and oriented to person, place, and time.  Skin: Skin is warm and dry.  Psychiatric: He has a normal mood and affect. His behavior is normal. Judgment and thought content normal.  Nursing note and vitals reviewed.  Sat is 98 percent on room air EKG: normal EKG, normal sinus rhythm, unchanged from previous tracings, normal sinus rhythm hr is 89. No acute st t changes.  Results for orders placed or performed in visit on 09/01/14  POCT CBC  Result Value Ref Range   WBC 5.2 4.6 - 10.2 K/uL   Lymph, poc 1.0 0.6 - 3.4   POC LYMPH PERCENT 19.3 10 - 50 %L   MID (cbc) 0.4 0 - 0.9   POC MID % 7.6 0 - 12 %M   POC Granulocyte 3.8 2 - 6.9   Granulocyte percent 73.1 37 - 80 %G   RBC 4.23 (A) 4.69 - 6.13 M/uL   Hemoglobin 12.5 (A) 14.1 - 18.1 g/dL   HCT, POC 65.7 (A) 84.6 - 53.7 %   MCV 89.0 80 - 97 fL   MCH, POC 29.6 27 - 31.2 pg   MCHC 33.3 31.8 - 35.4 g/dL   RDW, POC 96.2 %   Platelet Count, POC 256 142 -  424 K/uL   MPV 7.3 0 - 99.8 fL  POCT glucose (manual entry)  Result Value Ref Range   POC Glucose 210 (A) 70 - 99 mg/dl   Cbc including the wbc is normal  Glucose is elevated [facility] reading (PRIMARY) by  Dr. Mindi JunkerGottlieb.increased marking rll but no overt infiltrate No chf Heart size normal     Assessment & Plan:  1. Cough with fever and shortness of breath Suspect a bronchial infection Will do cbc and chest xray.no elevation of wbc and no infiltrate on xray Will rx with levaquin Tessalon perle for cough. 2. Achy shoulders, sob, Will eval ekg/ it is normal 3. Dizzy  With  hyperglycemia This is likely due to the pumonay infetion Will adjust insulin if necessary and diet  and fluid compliance treatment of hte underlying infection

## 2014-09-29 ENCOUNTER — Other Ambulatory Visit: Payer: Self-pay | Admitting: Internal Medicine

## 2014-10-06 ENCOUNTER — Other Ambulatory Visit: Payer: Self-pay | Admitting: Family Medicine

## 2014-11-04 ENCOUNTER — Other Ambulatory Visit: Payer: Self-pay | Admitting: Family Medicine

## 2014-11-04 ENCOUNTER — Other Ambulatory Visit: Payer: Self-pay | Admitting: Internal Medicine

## 2014-11-05 NOTE — Telephone Encounter (Signed)
Dr Katrinka BlazingSmith, you have seen pt recently for other chronic and acute issues, but don't see this med discussed. OK to RF?

## 2014-11-10 ENCOUNTER — Telehealth: Payer: Self-pay | Admitting: *Deleted

## 2014-11-10 NOTE — Telephone Encounter (Signed)
PA request for Pantoprazole 40 mg submitted via covermymeds. Key: [id number] Pt ID: [id number]

## 2014-11-12 NOTE — Telephone Encounter (Signed)
ZOXWRU:[id number];XBJYNWGCaseId:[id number];Product Name:QD: PPIs (Examples: Protonix, Pantoprazole, Prilosec, Omeprazole, Prevacid, Lansoprazole, Nexium, Esomeprazole, Aciphex, Rabeprazole) - *[place]*;Status:Denied;Appeal Information: Attention:Appeals and Investment banker, operationalGrievance Pharmacy Appeals Coordinator [address] [address],[place],[phone];  What would you like to try?

## 2014-11-13 NOTE — Telephone Encounter (Signed)
Not clear what is needed here

## 2014-11-13 NOTE — Telephone Encounter (Signed)
Sonya,  Was the PA for pantoprazole denied? Please advise thanks!

## 2014-11-14 ENCOUNTER — Other Ambulatory Visit: Payer: Self-pay | Admitting: Physician Assistant

## 2014-11-14 NOTE — Telephone Encounter (Signed)
Did they give any covered alternatives? Please advise thanks

## 2014-11-14 NOTE — Telephone Encounter (Signed)
No I just got the email denied They should be faxing denial [facility] with [facility] alternatives.

## 2014-11-14 NOTE — Telephone Encounter (Signed)
Dr Sherene SiresWert, I received the denial for pantoprazole, but unfortunately they listed NO alternative meds  Would you like him to get med OTC or try prescribing another med? Please advise, thanks!

## 2014-11-14 NOTE — Telephone Encounter (Signed)
lmtcb

## 2014-11-14 NOTE — Telephone Encounter (Signed)
Status:Denied °

## 2014-11-14 NOTE — Telephone Encounter (Signed)
otc prilosec 20 mg Take 30-60 min before first meal of the day

## 2014-11-17 NOTE — Telephone Encounter (Signed)
Pt is aware of MW's recommendations. Nothing further was needed. 

## 2014-12-03 ENCOUNTER — Ambulatory Visit: Payer: Self-pay | Admitting: Family Medicine

## 2014-12-10 ENCOUNTER — Other Ambulatory Visit: Payer: Self-pay | Admitting: Physician Assistant

## 2014-12-10 ENCOUNTER — Other Ambulatory Visit: Payer: Self-pay | Admitting: Internal Medicine

## 2014-12-22 ENCOUNTER — Other Ambulatory Visit: Payer: Self-pay | Admitting: Family Medicine

## 2014-12-23 NOTE — Telephone Encounter (Signed)
Called and LM that Rx was sent and make sure to keep appt.

## 2014-12-25 ENCOUNTER — Other Ambulatory Visit: Payer: Self-pay | Admitting: Family Medicine

## 2015-01-02 ENCOUNTER — Encounter: Payer: Self-pay | Admitting: Cardiovascular Disease

## 2015-01-02 ENCOUNTER — Ambulatory Visit (INDEPENDENT_AMBULATORY_CARE_PROVIDER_SITE_OTHER): Payer: BC Managed Care – PPO | Admitting: Cardiovascular Disease

## 2015-01-02 ENCOUNTER — Encounter: Payer: Self-pay | Admitting: Family Medicine

## 2015-01-02 ENCOUNTER — Ambulatory Visit (INDEPENDENT_AMBULATORY_CARE_PROVIDER_SITE_OTHER): Payer: BC Managed Care – PPO | Admitting: Family Medicine

## 2015-01-02 VITALS — BP 108/67 | HR 93 | Temp 98.0°F | Resp 16 | Ht 73.0 in | Wt 301.4 lb

## 2015-01-02 VITALS — BP 118/60 | HR 96 | Ht 73.0 in | Wt 308.0 lb

## 2015-01-02 DIAGNOSIS — F411 Generalized anxiety disorder: Secondary | ICD-10-CM

## 2015-01-02 DIAGNOSIS — I5032 Chronic diastolic (congestive) heart failure: Secondary | ICD-10-CM

## 2015-01-02 DIAGNOSIS — I2510 Atherosclerotic heart disease of native coronary artery without angina pectoris: Secondary | ICD-10-CM

## 2015-01-02 DIAGNOSIS — I10 Essential (primary) hypertension: Secondary | ICD-10-CM

## 2015-01-02 DIAGNOSIS — E119 Type 2 diabetes mellitus without complications: Secondary | ICD-10-CM | POA: Diagnosis not present

## 2015-01-02 DIAGNOSIS — E669 Obesity, unspecified: Secondary | ICD-10-CM | POA: Diagnosis not present

## 2015-01-02 DIAGNOSIS — E785 Hyperlipidemia, unspecified: Secondary | ICD-10-CM | POA: Diagnosis not present

## 2015-01-02 DIAGNOSIS — Z9884 Bariatric surgery status: Secondary | ICD-10-CM | POA: Diagnosis not present

## 2015-01-02 DIAGNOSIS — G4730 Sleep apnea, unspecified: Secondary | ICD-10-CM

## 2015-01-02 LAB — CBC WITH DIFFERENTIAL/PLATELET
BASOS ABS: 0.1 10*3/uL (ref 0.0–0.1)
Basophils Relative: 1 % (ref 0–1)
EOS PCT: 4 % (ref 0–5)
Eosinophils Absolute: 0.2 10*3/uL (ref 0.0–0.7)
HCT: 39.2 % (ref 39.0–52.0)
HEMOGLOBIN: 13.4 g/dL (ref 13.0–17.0)
LYMPHS ABS: 2.7 10*3/uL (ref 0.7–4.0)
Lymphocytes Relative: 48 % — ABNORMAL HIGH (ref 12–46)
MCH: 29.1 pg (ref 26.0–34.0)
MCHC: 34.2 g/dL (ref 30.0–36.0)
MCV: 85 fL (ref 78.0–100.0)
MONOS PCT: 7 % (ref 3–12)
MPV: 10.2 fL (ref 8.6–12.4)
Monocytes Absolute: 0.4 10*3/uL (ref 0.1–1.0)
Neutro Abs: 2.3 10*3/uL (ref 1.7–7.7)
Neutrophils Relative %: 40 % — ABNORMAL LOW (ref 43–77)
PLATELETS: 295 10*3/uL (ref 150–400)
RBC: 4.61 MIL/uL (ref 4.22–5.81)
RDW: 13.7 % (ref 11.5–15.5)
WBC: 5.7 10*3/uL (ref 4.0–10.5)

## 2015-01-02 LAB — COMPREHENSIVE METABOLIC PANEL
ALBUMIN: 3.9 g/dL (ref 3.5–5.2)
ALT: 18 U/L (ref 0–53)
AST: 16 U/L (ref 0–37)
Alkaline Phosphatase: 64 U/L (ref 39–117)
BUN: 17 mg/dL (ref 6–23)
CALCIUM: 9.1 mg/dL (ref 8.4–10.5)
CHLORIDE: 100 meq/L (ref 96–112)
CO2: 29 meq/L (ref 19–32)
CREATININE: 0.84 mg/dL (ref 0.50–1.35)
GLUCOSE: 183 mg/dL — AB (ref 70–99)
POTASSIUM: 4.5 meq/L (ref 3.5–5.3)
Sodium: 138 mEq/L (ref 135–145)
Total Bilirubin: 0.5 mg/dL (ref 0.2–1.2)
Total Protein: 7.5 g/dL (ref 6.0–8.3)

## 2015-01-02 LAB — LIPID PANEL
CHOLESTEROL: 157 mg/dL (ref 0–200)
HDL: 43 mg/dL (ref >=40)
LDL Cholesterol: 91 mg/dL (ref 0–99)
TRIGLYCERIDES: 113 mg/dL (ref <150)
Total CHOL/HDL Ratio: 3.7 Ratio
VLDL: 23 mg/dL (ref 0–40)

## 2015-01-02 LAB — POCT GLYCOSYLATED HEMOGLOBIN (HGB A1C): HEMOGLOBIN A1C: 11.4

## 2015-01-02 MED ORDER — INSULIN ASPART 100 UNIT/ML FLEXPEN: 15.0000 [IU] | PEN_INJECTOR | Freq: Three times a day (TID) | SUBCUTANEOUS | Status: DC

## 2015-01-02 MED ORDER — VALSARTAN 80 MG PO TABS: 80.0000 mg | ORAL_TABLET | Freq: Every day | ORAL | Status: DC

## 2015-01-02 NOTE — Progress Notes (Signed)
Chief Complaint  Patient presents with  . Dizziness     History of Present Illness: 54 y.o. male with a history of HTN, HLD, morbid obesity status post lap band surgery, diabetes mellitus, sleep apnea and CAD who is here today to establish in my office. I haven not met him before today. He was seen in the emergency room 03/01/14 with chest pain and dyspnea by Dr. Rayann Heman. Cardiac markers were normal. Stress myoview demonstrated apical defect (possible scar). LHC was recommended. This demonstrated mild plaque but no obstructive disease. LVEDP was elevated and continued management of diastolic CHF was recommended.He was seen in follow up by Richardson Dopp, PA-C and was started on Spironolactone 12.5 mg QD. Echo 03/26/14 with normal LV systolic function, grade 1 diastolic dysfunction.   He is here today for follow up. He has had no further chest pain. Breathing has been normal. He has lost 25 lbs in the last three months. He is a [employer]il [employer]  Primary Care Physician: [name]  Last Lipid Profile:Lipid Panel     Component Value Date/Time   CHOL 157 01/02/2015 1155   TRIG 113 01/02/2015 1155   TRIG 88 07/24/2006 1009   HDL 43 01/02/2015 1155   CHOLHDL 3.7 01/02/2015 1155   CHOLHDL 4.2 CALC 07/24/2006 1009   VLDL 23 01/02/2015 1155   [place] 91 01/02/2015 1155     Past Medical History  Diagnosis Date  . Diabetes mellitus   . Hyperlipidemia   . Hypertension   . Impotence of organic origin   . Obesity, unspecified   . Allergic rhinitis, cause unspecified   . Other chronic allergic conjunctivitis   . Obstructive sleep apnea (adult) (pediatric)   . Degeneration of intervertebral disc, site unspecified   . Personal history of colonic polyps     Past Surgical History  Procedure Laterality Date  . Laparoscopic gastric banding  09/15/09  . Back surgery      x3  . Quadriceps tendon repair      left  . Quadriceps repair      left  . Rotator cuff repair      right  .  Appendectomy    . Tonsillectomy and adenoidectomy  1960  . Arthroscopy of knee  1980  . Colonoscopy  07/11/2009    single polyp; repeat in 5 years.  [name].  . Allergy consult  07/11/2010    SOB, itching, facial tingling, hypotension.  Allergy testing: no food allergies; +reaction to tree pollen, grass pollen, dust mites, mold.  Avoid diclofenac and similar products.  [name].  Fabienne Bruns consult  07/11/2010    CT chest in ED (gastric thickening).  [name].  No EGD warranted due to lack of symptoms.  . Sleep study  07/11/2010    repeat sleep study due to weight loss.  Severe OSA; CPAP titration to 8 cm.    . Admission  01/2009    [facility].  Syncopal event with nausea, diaphoresis, hypertension.  D-dimer elevated at 2.54 with negative CT chest. and VQ scan.  Cardiac enzymes negative x 3.  Labs normal.  Cardiology consult---cardiolite abn; cath, 2D-echo, holter monitor unrevealing.  SE H&V.  Marland Kitchen Spine surgery    . Left heart catheterization with coronary angiogram N/A 04/02/2014    Procedure: LEFT HEART CATHETERIZATION WITH CORONARY ANGIOGRAM;  Surgeon: [name], MD;  Location: [facility] CATH LAB;  Service: Cardiovascular;  Laterality: N/A;    Current Outpatient Prescriptions  Medication Sig Dispense Refill  .  aspirin EC 81 MG tablet Take 81 mg by mouth daily.    . B-D INS SYR ULTRAFINE 1CC/31G 31G X 5/16" 1 ML MISC USE FOR INSULIN INJECTIONS THREE TIMES DAILY 100 each 5  . Calcium-Magnesium-Vitamin D (CALCIUM 500 PO) Take 500 mg by mouth 3 (three) times daily.    . cyclobenzaprine (FLEXERIL) 5 MG tablet TAKE ONE TABLET BY MOUTH EVERY NIGHT AT BEDTIME 30 tablet 3  . famotidine (PEPCID) 20 MG tablet TAKE ONE (1) TABLET BY MOUTH EVERY NIGHTAT BEDTIME 30 tablet 0  . FLUoxetine (PROZAC) 20 MG tablet Take 1 tablet (20 mg total) by mouth daily. 30 tablet 0  . glucose blood (ONE TOUCH ULTRA TEST) test strip Test blood sugar 3 times daily. Dx code: E11.9 300 each 3  . insulin aspart (NOVOLOG FLEXPEN) 100 UNIT/ML  FlexPen Inject 15 Units into the skin 3 (three) times daily with meals. 15 mL 11  . insulin glargine (LANTUS) 100 UNIT/ML injection Inject 76 Units into the skin every morning.    . Insulin Syringe-Needle U-100 (B-D INS SYR ULTRAFINE 1CC/30G) 30G X 1/2" 1 ML MISC 1 each by Does not apply route See admin instructions. Use daily with insulin    . metFORMIN (GLUCOPHAGE) 1000 MG tablet TAKE ONE TABLET BY MOUTH TWICE DAILY WITH MEALS. 60 tablet 0  . Multiple Vitamin (MULTIVITAMIN) capsule Take 1 capsule by mouth daily.    Marland Kitchen omeprazole (PRILOSEC) 40 MG capsule Take 40 mg by mouth daily.    . sildenafil (VIAGRA) 100 MG tablet Take 100 mg by mouth daily as needed for erectile dysfunction.     . simvastatin (ZOCOR) 40 MG tablet TAKE 1 TABLET BY MOUTH DAILY 30 tablet 2  . spironolactone (ALDACTONE) 25 MG tablet Take 0.5 tablets (12.5 mg total) by mouth daily. 30 tablet 11  . valsartan (DIOVAN) 160 MG tablet TAKE 1 TABLET BY MOUTH DAILY 30 tablet 2   No current facility-administered medications for this visit.    Allergies  Allergen Reactions  . Diclofenac Sodium Shortness Of Breath, Itching and Other (See Comments)    Hypotension   . Voltaren [Diclofenac Sodium] Anaphylaxis    History   Social History  . Marital Status: Married    Spouse Name: N/A  . Number of Children: 3  . Years of Education: N/A   Occupational History  . [employer]     Supervisor x 23 yrs   Social History Main Topics  . Smoking status: Former Smoker -- 0.25 packs/day for 2 years    Types: Cigarettes  . Smokeless tobacco: Never Used  . Alcohol Use: 0.0 oz/week    0 Standard drinks or equivalent per week     Comment: occasional Beer on ce every two weeks on the weekends  . Drug Use: No  . Sexual Activity: Yes   Other Topics Concern  . Not on file   Social History Narrative   Married x 26 years, happy.       Children: 3 children (27, 27, 24); 3 grandchildren; 1 gg.       Lives: with wife, dog.       Employment: employed.      Tobacco: never      Alcohol: beer twice per month on weekends.      Drugs: none      Exercise: walking three days per week.      Seatbelt: 100% of time      Guns: loaded and secured/locked.  Sexual activity: sexually active with wife.      Sunscreen: yes.    Family History  Problem Relation Age of Onset  . Cancer Father     skin  . Heart disease Father 44    AMI first age 12; stents.  . Diabetes Father   . Hypertension Father   . Hyperlipidemia Father   . Peripheral Artery Disease Father   . Parkinson's disease Father   . Cancer Maternal Uncle     lymphoma  . Cancer Paternal Grandfather     skin  . Diabetes Mother   . Arthritis Mother     total hip replacement  . Kidney failure Mother   . Depression Sister   . Diabetes Sister     Review of Systems:  As stated in the HPI and otherwise negative.   BP 118/60 mmHg  Pulse 96  Ht _0  (1.854 m)  Wt 308 lb (139.708 kg)  BMI 40.64 kg/m2  SpO2 98%  Physical Examination: General: Well developed, well nourished, NAD HEENT: OP clear, mucus membranes moist SKIN: warm, dry. No rashes. Neuro: No focal deficits Musculoskeletal: Muscle strength 5/5 all ext Psychiatric: Mood and affect normal Neck: No JVD, no carotid bruits, no thyromegaly, no lymphadenopathy. Lungs:Clear bilaterally, no wheezes, rhonci, crackles Cardiovascular: Regular rate and rhythm. No murmurs, gallops or rubs. Abdomen:Soft. Bowel sounds present. Non-tender.  Extremities: No lower extremity edema. Pulses are 2 + in the bilateral DP/PT.   Cardiac cath 04/02/14: HEMODYNAMICS: Aortic pressure 130/69 mmHg; LV pressure 146/14 mmHg; LVEDP 22 mm mercury ANGIOGRAPHIC DATA: The left main coronary artery is widely patent/normal. The left anterior descending artery is proximally calcified free of any significant obstruction. The LAD wraps around the left ventricular apex. A large first diagonal arises and has ostial to proximal  30% narrowing. . The left circumflex artery is widely patent and normal. Gives origin to 3 obtuse marginal branches with the first and second being the larger of the 3. The right coronary artery is widely patent with distal 30% narrowing.Marland Kitchen LEFT VENTRICULOGRAM: Left ventricular angiogram was done in the 30 RAO projection and revealed normal cavity size and contractility with an EF of 60%  Echo 03/26/14: Procedure narrative: Transthoracic echocardiography for left ventricular function evaluation, for right ventricular function evaluation, for assessment of valvular function, and for preoperative diagnosis. Image quality was suboptimal. The study was technically difficult, as a result of poor acoustic windows and poor sound wave transmission. Intravenous contrast (Definity) was administered to enhance regional wall motion assessment and opacify the LV. - Left ventricle: The cavity size was normal. There was moderate concentric hypertrophy. Systolic function was normal. The estimated ejection fraction was in the range of 60% to 65%. Images were inadequate for LV wall motion assessment. Doppler parameters are consistent with abnormal left ventricular relaxation (grade 1 diastolic dysfunction). The E/e&' ratio is between 8-15, suggesting indeterminate LV filling pressure. - Left atrium: The atrium was normal in size.  EKG:  EKG is not ordered today. The ekg ordered today demonstrates   Recent Labs: 03/01/2014: Pro B Natriuretic peptide (BNP) 94.3 05/28/2014: TSH 3.422 01/02/2015: ALT 18; BUN 17; Creat 0.84; Hemoglobin 13.4; Platelets 295; Potassium 4.5; Sodium 138   Lipid Panel    Component Value Date/Time   CHOL 157 01/02/2015 1155   TRIG 113 01/02/2015 1155   TRIG 88 07/24/2006 1009   HDL 43 01/02/2015 1155   CHOLHDL 3.7 01/02/2015 1155   CHOLHDL 4.2 CALC 07/24/2006 1009   VLDL 23 01/02/2015  [place] 01/02/2015 1155     Wt Readings from Last 3  Encounters:  01/02/15 308 lb (139.708 kg)  01/02/15 301 lb 6.4 oz (136.714 kg)  09/01/14 325 lb 6 oz (147.589 kg)     Other studies Reviewed: Additional studies/ records that were reviewed today include: . Review of the above records demonstrates:    Assessment and Plan:   1. Chronic diastolic ERQ:[id number].Weight is stable. No LE edema.    2. Coronary artery disease:Mild plaque by cardiac catheterization 2015. No angina. Continue aspirin, statin.   3. Essential hypertension:He has lost weight and has been hypotensive. Will reduce Valsartan to 80 mg daily.   4. Dyslipidemia: Managed by primary care. Continue statin.  Current medicines are reviewed at length with the patient today.  The patient does not have concerns regarding medicines.  The following changes have been made:  no change  Labs/ tests ordered today include:  No orders of the defined types were placed in this encounter.    Disposition:   F/U with me in 6 months  Signed, Lauree Chandler, MD 01/02/2015 3:58 PM    [address], [place], [place]  [address] Phone: [phone]; Fax: [phone]

## 2015-01-02 NOTE — Progress Notes (Signed)
Subjective:    Patient ID: [name], male    DOB: 1961-04-04, 54 y.o.   MRN: [id number]  01/02/2015  Follow-up   HPI This 54 y.o. male presents for four month follow-up:  1. DOE: better but still persistent; if really exerts self, really starts sweating and getting SOB. With bending over, gets dizzy.  BP today low 108/67.  When gets hot and sweaty and SOB, dizziness also develops.  No chest pain, chest tightness.  No syncope.  2.  DMII:  Couple of low sugars in middle of night.  Lantus 76.   Eats breakfast at work; does not administer insulin/Novolog.  Usually no Novolog at lunch.  Usually gives self 10 units before supper. Eats cheerios in morning.  Sugars are "good in normal range". 150 is highest in the morning.  At night, before supper sugars running 200.     Review of Systems  Constitutional: Negative for fever, chills, diaphoresis, activity change, appetite change and fatigue.  Eyes: Negative for visual disturbance.  Respiratory: Positive for shortness of breath. Negative for cough.   Cardiovascular: Negative for chest pain, palpitations and leg swelling.  Endocrine: Negative for cold intolerance, heat intolerance, polydipsia, polyphagia and polyuria.  Neurological: Negative for dizziness, tremors, seizures, syncope, facial asymmetry, speech difficulty, weakness, light-headedness, numbness and headaches.    Past Medical History  Diagnosis Date  . Diabetes mellitus   . Hyperlipidemia   . Hypertension   . Impotence of organic origin   . Obesity, unspecified   . Allergic rhinitis, cause unspecified   . Other chronic allergic conjunctivitis   . Obstructive sleep apnea (adult) (pediatric)   . Degeneration of intervertebral disc, site unspecified   . Personal history of colonic polyps    Past Surgical History  Procedure Laterality Date  . Laparoscopic gastric banding  09/15/09  . Back surgery      x3  . Quadriceps tendon repair      left  . Quadriceps repair     left  . Rotator cuff repair      right  . Appendectomy    . Tonsillectomy and adenoidectomy  1960  . Arthroscopy of knee  1980  . Colonoscopy  07/11/2009    single polyp; repeat in 5 years.  [name].  . Allergy consult  07/11/2010    SOB, itching, facial tingling, hypotension.  Allergy testing: no food allergies; +reaction to tree pollen, grass pollen, dust mites, mold.  Avoid diclofenac and similar products.  [name].  [name] consult  07/11/2010    CT chest in ED (gastric thickening).  [name].  No EGD warranted due to lack of symptoms.  . Sleep study  07/11/2010    repeat sleep study due to weight loss.  Severe OSA; CPAP titration to 8 cm.    . Admission  01/2009    [facility].  Syncopal event with nausea, diaphoresis, hypertension.  D-dimer elevated at 2.54 with negative CT chest. and VQ scan.  Cardiac enzymes negative x 3.  Labs normal.  Cardiology consult---cardiolite abn; cath, 2D-echo, holter monitor unrevealing.  SE H&V.  Marland Kitchen Spine surgery    . Left heart catheterization with coronary angiogram N/A 04/02/2014    Procedure: LEFT HEART CATHETERIZATION WITH CORONARY ANGIOGRAM;  Surgeon: Lesleigh Noe, MD;  Location: [facility] CATH LAB;  Service: Cardiovascular;  Laterality: N/A;   Allergies  Allergen Reactions  . Diclofenac Sodium Shortness Of Breath, Itching and Other (See Comments)    Hypotension   . Voltaren [  Diclofenac Sodium] Anaphylaxis   Current Outpatient Prescriptions  Medication Sig Dispense Refill  . aspirin EC 81 MG tablet Take 81 mg by mouth daily.    . Calcium-Magnesium-Vitamin D (CALCIUM 500 PO) Take 500 mg by mouth 3 (three) times daily.    . cyclobenzaprine (FLEXERIL) 5 MG tablet Take 5 mg by mouth at bedtime as needed for muscle spasms.    . famotidine (PEPCID) 20 MG tablet TAKE ONE (1) TABLET BY MOUTH EVERY NIGHTAT BEDTIME 30 tablet 0  . FLUoxetine (PROZAC) 20 MG tablet Take 1 tablet (20 mg total) by mouth daily. 30 tablet 0  . insulin glargine (LANTUS) 100 UNIT/ML injection  Inject 76 Units into the skin every morning.    Marland Kitchen LANTUS 100 UNIT/ML injection INJECT 70 UNITS INTO THE SKIN EVERY NIGHT AT BEDTIME AS DIRECTED 30 mL 0  . metFORMIN (GLUCOPHAGE) 1000 MG tablet TAKE ONE TABLET BY MOUTH TWICE DAILY WITH MEALS. 60 tablet 0  . Multiple Vitamin (MULTIVITAMIN) capsule Take 1 capsule by mouth daily.    Marland Kitchen NOVOLOG FLEXPEN 100 UNIT/ML FlexPen Inject 5-15 Units into the skin 3 (three) times daily as needed for high blood sugar (according to sliding scale).     . pantoprazole (PROTONIX) 40 MG tablet TAKE 1 TABLET BY MOUTH DAILY. TAKE 30-60MIN BEFORE FIRST MEAL OF THE DAY 30 tablet 2  . sildenafil (VIAGRA) 100 MG tablet Take 100 mg by mouth daily as needed for erectile dysfunction.     . simvastatin (ZOCOR) 40 MG tablet TAKE 1 TABLET BY MOUTH DAILY 30 tablet 2  . spironolactone (ALDACTONE) 25 MG tablet Take 0.5 tablets (12.5 mg total) by mouth daily. 30 tablet 11  . valsartan (DIOVAN) 160 MG tablet TAKE 1 TABLET BY MOUTH DAILY 30 tablet 2  . B-D INS SYR ULTRAFINE 1CC/31G 31G X 5/16" 1 ML MISC USE FOR INSULIN INJECTIONS THREE TIMES DAILY 100 each 5  . cyclobenzaprine (FLEXERIL) 5 MG tablet TAKE ONE TABLET BY MOUTH EVERY NIGHT AT BEDTIME 30 tablet 3  . glucose blood (ONE TOUCH ULTRA TEST) test strip Test blood sugar 3 times daily. Dx code: E11.9 300 each 3  . Insulin Syringe-Needle U-100 (B-D INS SYR ULTRAFINE 1CC/30G) 30G X 1/2" 1 ML MISC 1 each by Does not apply route See admin instructions. Use daily with insulin    . levofloxacin (LEVAQUIN) 500 MG tablet Take 1 tablet (500 mg total) by mouth daily. (Patient not taking: Reported on 01/02/2015) 7 tablet 0  . metFORMIN (GLUCOPHAGE) 1000 MG tablet TAKE 1 TABLET BY MOUTH TWICE A DAY WITH A MEAL 60 tablet 0   No current facility-administered medications for this visit.       Objective:    BP 108/67 mmHg  Pulse 93  Temp(Src) 98 F (36.7 C) (Oral)  Resp 16  Ht  (1.854 m)  Wt 301 lb 6.4 oz (136.714 kg)  BMI 39.77 kg/m2   SpO2 99% Physical Exam  Constitutional: He is oriented to person, place, and time. He appears well-developed and well-nourished. No distress.  HENT:  Head: Normocephalic and atraumatic.  Right Ear: External ear normal.  Left Ear: External ear normal.  Nose: Nose normal.  Mouth/Throat: Oropharynx is clear and moist.  Eyes: Conjunctivae and EOM are normal. Pupils are equal, round, and reactive to light.  Neck: Normal range of motion. Neck supple. Carotid bruit is not present. No thyromegaly present.  Cardiovascular: Normal rate, regular rhythm, normal heart sounds and intact distal pulses.  Exam reveals no  gallop and no friction rub.   No murmur heard. Pulmonary/Chest: Effort normal and breath sounds normal. He has no wheezes. He has no rales.  Abdominal: Soft. Bowel sounds are normal. He exhibits no distension and no mass. There is no tenderness. There is no rebound and no guarding.  Lymphadenopathy:    He has no cervical adenopathy.  Neurological: He is alert and oriented to person, place, and time. No cranial nerve deficit.  Skin: Skin is warm and dry. No rash noted. He is not diaphoretic.  Psychiatric: He has a normal mood and affect. His behavior is normal.  Nursing note and vitals reviewed.  Results for orders placed or performed in visit on 01/02/15  POCT glycosylated hemoglobin (Hb A1C)  Result Value Ref Range   Hemoglobin A1C 11.4        Assessment & Plan:   1. Sleep apnea   2. Obesity   3. Essential hypertension   4. History of laparoscopic adjustable gastric banding   5. Generalized anxiety disorder   6. Dyslipidemia   7. Type 2 diabetes mellitus without complication   8. Chronic diastolic CHF (congestive heart failure)   9. Coronary artery disease involving native coronary artery of native heart without angina pectoris     No orders of the defined types were placed in this encounter.    No Follow-up on file.

## 2015-01-02 NOTE — Patient Instructions (Signed)
1.  Increase Novolog to 16 units before supper. 2.  Start Novolog 10 units before breakfast.

## 2015-01-02 NOTE — Patient Instructions (Signed)
Medication Instructions:  Your physician has recommended you make the following change in your medication: Decrease Valsartan to 80 mg by mouth daily.   Labwork: none  Testing/Procedures: none  Follow-Up: Your physician wants you to follow-up in: 6 months.  You will receive a reminder letter in the mail two months in advance. If you don't receive a letter, please call our office to schedule the follow-up appointment.

## 2015-02-13 ENCOUNTER — Other Ambulatory Visit: Payer: Self-pay | Admitting: Family Medicine

## 2015-02-23 ENCOUNTER — Other Ambulatory Visit: Payer: Self-pay | Admitting: Family Medicine

## 2015-03-12 ENCOUNTER — Other Ambulatory Visit: Payer: Self-pay | Admitting: Internal Medicine

## 2015-03-23 ENCOUNTER — Other Ambulatory Visit: Payer: Self-pay | Admitting: Family Medicine

## 2015-03-24 ENCOUNTER — Other Ambulatory Visit: Payer: Self-pay | Admitting: Family Medicine

## 2015-03-30 ENCOUNTER — Ambulatory Visit: Payer: BC Managed Care – PPO | Admitting: Family Medicine

## 2015-04-06 ENCOUNTER — Ambulatory Visit: Payer: BC Managed Care – PPO | Admitting: Family Medicine

## 2015-04-23 ENCOUNTER — Other Ambulatory Visit: Payer: Self-pay | Admitting: Family Medicine

## 2015-04-24 ENCOUNTER — Other Ambulatory Visit: Payer: Self-pay

## 2015-04-24 DIAGNOSIS — I5032 Chronic diastolic (congestive) heart failure: Secondary | ICD-10-CM

## 2015-04-24 MED ORDER — SPIRONOLACTONE 25 MG PO TABS: 12.5000 mg | ORAL_TABLET | Freq: Every day | ORAL | Status: DC

## 2015-04-27 ENCOUNTER — Other Ambulatory Visit: Payer: Self-pay | Admitting: Family Medicine

## 2015-04-28 NOTE — Telephone Encounter (Signed)
Spoke with patient he will RTC in November for diabetes check

## 2015-05-01 ENCOUNTER — Other Ambulatory Visit: Payer: Self-pay | Admitting: Family Medicine

## 2015-05-18 ENCOUNTER — Ambulatory Visit (INDEPENDENT_AMBULATORY_CARE_PROVIDER_SITE_OTHER): Payer: BC Managed Care – PPO | Admitting: Family Medicine

## 2015-05-18 ENCOUNTER — Encounter: Payer: Self-pay | Admitting: Family Medicine

## 2015-05-18 VITALS — BP 116/72 | HR 90 | Temp 97.9°F | Resp 16 | Ht 73.0 in | Wt 298.6 lb

## 2015-05-18 DIAGNOSIS — I5030 Unspecified diastolic (congestive) heart failure: Secondary | ICD-10-CM | POA: Diagnosis not present

## 2015-05-18 DIAGNOSIS — Z23 Encounter for immunization: Secondary | ICD-10-CM

## 2015-05-18 DIAGNOSIS — I10 Essential (primary) hypertension: Secondary | ICD-10-CM

## 2015-05-18 DIAGNOSIS — E7800 Pure hypercholesterolemia, unspecified: Secondary | ICD-10-CM | POA: Diagnosis not present

## 2015-05-18 DIAGNOSIS — Z1159 Encounter for screening for other viral diseases: Secondary | ICD-10-CM

## 2015-05-18 DIAGNOSIS — Z794 Long term (current) use of insulin: Secondary | ICD-10-CM | POA: Diagnosis not present

## 2015-05-18 DIAGNOSIS — R0600 Dyspnea, unspecified: Secondary | ICD-10-CM | POA: Diagnosis not present

## 2015-05-18 DIAGNOSIS — Z114 Encounter for screening for human immunodeficiency virus [HIV]: Secondary | ICD-10-CM | POA: Diagnosis not present

## 2015-05-18 DIAGNOSIS — G4733 Obstructive sleep apnea (adult) (pediatric): Secondary | ICD-10-CM | POA: Diagnosis not present

## 2015-05-18 DIAGNOSIS — E119 Type 2 diabetes mellitus without complications: Secondary | ICD-10-CM | POA: Diagnosis not present

## 2015-05-18 DIAGNOSIS — E669 Obesity, unspecified: Secondary | ICD-10-CM

## 2015-05-18 DIAGNOSIS — Z9989 Dependence on other enabling machines and devices: Secondary | ICD-10-CM

## 2015-05-18 LAB — COMPREHENSIVE METABOLIC PANEL
ALT: 12 U/L (ref 9–46)
AST: 15 U/L (ref 10–35)
Albumin: 3.8 g/dL (ref 3.6–5.1)
Alkaline Phosphatase: 71 U/L (ref 40–115)
BUN: 17 mg/dL (ref 7–25)
CALCIUM: 8.8 mg/dL (ref 8.6–10.3)
CHLORIDE: 101 mmol/L (ref 98–110)
CO2: 27 mmol/L (ref 20–31)
Creat: 0.74 mg/dL (ref 0.70–1.33)
GLUCOSE: 104 mg/dL — AB (ref 65–99)
POTASSIUM: 4.1 mmol/L (ref 3.5–5.3)
Sodium: 141 mmol/L (ref 135–146)
Total Bilirubin: 0.5 mg/dL (ref 0.2–1.2)
Total Protein: 7 g/dL (ref 6.1–8.1)

## 2015-05-18 LAB — POCT GLYCOSYLATED HEMOGLOBIN (HGB A1C): Hemoglobin A1C: 14

## 2015-05-18 LAB — CBC WITH DIFFERENTIAL/PLATELET
Basophils Absolute: 0.1 10*3/uL (ref 0.0–0.1)
Basophils Relative: 2 % — ABNORMAL HIGH (ref 0–1)
EOS PCT: 4 % (ref 0–5)
Eosinophils Absolute: 0.2 10*3/uL (ref 0.0–0.7)
HEMATOCRIT: 40.6 % (ref 39.0–52.0)
Hemoglobin: 13.5 g/dL (ref 13.0–17.0)
LYMPHS PCT: 52 % — AB (ref 12–46)
Lymphs Abs: 3.1 10*3/uL (ref 0.7–4.0)
MCH: 29.1 pg (ref 26.0–34.0)
MCHC: 33.3 g/dL (ref 30.0–36.0)
MCV: 87.5 fL (ref 78.0–100.0)
MONO ABS: 0.5 10*3/uL (ref 0.1–1.0)
MONOS PCT: 8 % (ref 3–12)
MPV: 10.3 fL (ref 8.6–12.4)
Neutro Abs: 2 10*3/uL (ref 1.7–7.7)
Neutrophils Relative %: 34 % — ABNORMAL LOW (ref 43–77)
Platelets: 319 10*3/uL (ref 150–400)
RBC: 4.64 MIL/uL (ref 4.22–5.81)
RDW: 13.4 % (ref 11.5–15.5)
WBC: 6 10*3/uL (ref 4.0–10.5)

## 2015-05-18 LAB — HEPATITIS C ANTIBODY: HCV Ab: NEGATIVE

## 2015-05-18 LAB — LIPID PANEL
Cholesterol: 172 mg/dL (ref 125–200)
HDL: 44 mg/dL (ref >=40)
LDL CALC: 106 mg/dL (ref <130)
TRIGLYCERIDES: 108 mg/dL (ref <150)
Total CHOL/HDL Ratio: 3.9 Ratio (ref <=5.0)
VLDL: 22 mg/dL (ref <30)

## 2015-05-18 LAB — GLUCOSE, POCT (MANUAL RESULT ENTRY): POC Glucose: 99 mg/dl (ref 70–99)

## 2015-05-18 LAB — HEMOGLOBIN A1C: Hgb A1c MFr Bld: 14 % — AB (ref 4.0–6.0)

## 2015-05-18 MED ORDER — INSULIN GLARGINE 100 UNIT/ML ~~LOC~~ SOLN: SUBCUTANEOUS | Status: DC

## 2015-05-18 MED ORDER — ALBUTEROL SULFATE HFA 108 (90 BASE) MCG/ACT IN AERS: 2.0000 | INHALATION_SPRAY | Freq: Four times a day (QID) | RESPIRATORY_TRACT | Status: DC | PRN

## 2015-05-18 NOTE — Patient Instructions (Signed)
1.  Lantus 82 units daily. 2.  Always administer Novolog 14 units with supper. 3.  Try to administer Novolog 8 units before breakfast and lunch.

## 2015-05-18 NOTE — Progress Notes (Signed)
Subjective:    Patient ID: [name], male    DOB: [birth year], 54 y.o.   MRN: [id number]  05/18/2015  Follow-up; Hyperlipidemia; Hypertension; and Diabetes   HPI This 54 y.o. male presents for five month follow-up:   1. DMII: increased Novolog to 16 units before supper.  Start Novolog 10 units before breakfast.  Sugar is changing to [facility].  Lantus will no longer be on plan.  Levemir and [place].   Lantus 76 units daily.  Novolog in morning; minimal breakfast at work.  Not eating until 10:00am.  No Novolog in morning. Does not eat a lot of lunch either.  Lunchable or soup.  Does not go out to eat.  Vegetables.  Some low sugars in September.  Snacking some at night while working.  Prescriptions must be 30 day supply.   Fasting sugars running 150-175.   Novolog 10 units before supper.  Not checking sugars in evenings.    2.  HTN:  Patient reports good compliance with medication, good tolerance to medication, and good symptom control.    3.  Hyperlipidemia: Patient reports good compliance with medication, good tolerance to medication, and good symptom control.    4.  DOE: in July, went to reinactment in [place]; was really hot.  Laid down and took 30 minute nap and fell asleep. Upon awakening, everything was spinning; took 15-30 minutes; when laid down again that night, same thing occurred.  It was getting really hot.  Organization decided not to fight due to heat index.  If bent over, head would spin.  No recurrence since then.  Now suffering with excessive sweating and DOE.  Walking is fine; there are days when activity gets up, breathing is a struggle.  Then can go full course with no problems. This weekend, did not go because of long ways; during fights, carried and marched without any problems.  Saturday morning, breathing was off.  In afternoon, breathing was not a problem.  Some days can go fine; other days, no energy and does not want to do anything.  Wiped out completely.  Working  weird hours for past month; working nights.  Having more jerks in middle of night now.  Not wearing CPAP lately; needs to be retested.  Nasal pillows not fitting.  Needs new mask gear.  Last testing one year.  Has struggled with [employer].  Prefers [facility].  May need to find different face mask.  Was causing sores on nose.  If does not shave.  Albuterol did help a little when it first started.   S/p cardiology evaluation with onset of DOE.  Has appointment in January 2017. S/p pulmonology consultation. S/p CT chest with negative results. Breathing has improved from onset but it still bothers patient.   Breathing is unpredictable.   In April was sitting up tents. Heat is an issue.  Breathing issues  S/p cardiac catheterization 04/02/2014.   Mild CHF  5.  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  Father with selective hearing.    Review of Systems  Constitutional: Positive for diaphoresis. Negative for fever, chills, activity change, appetite change and fatigue.  Eyes: Negative for visual disturbance.  Respiratory: Positive for shortness of breath. Negative for cough and wheezing.   Cardiovascular: Negative for chest pain, palpitations and leg swelling.  Gastrointestinal: Negative for nausea, vomiting, abdominal pain, diarrhea and constipation.  Endocrine: Negative for cold intolerance, heat intolerance, polydipsia, polyphagia and polyuria.  Skin: Negative for color change, rash and wound.  Neurological: Negative for dizziness, tremors, seizures, syncope, facial asymmetry, speech difficulty, weakness, light-headedness, numbness and headaches.  Psychiatric/Behavioral: Negative for sleep disturbance and dysphoric mood. The patient is not nervous/anxious.     Past Medical History  Diagnosis Date  . Diabetes mellitus   . Hyperlipidemia   . Hypertension   . Impotence of organic origin   . Obesity, unspecified   . Allergic rhinitis, cause unspecified    . Other chronic allergic conjunctivitis   . Obstructive sleep apnea (adult) (pediatric)   . Degeneration of intervertebral disc, site unspecified   . Personal history of colonic polyps    Past Surgical History  Procedure Laterality Date  . Laparoscopic gastric banding  09/15/09  . Back surgery      x3  . Quadriceps tendon repair      left  . Quadriceps repair      left  . Rotator cuff repair      right  . Appendectomy    . Tonsillectomy and adenoidectomy  1960  . Arthroscopy of knee  1980  . Colonoscopy  07/11/2009    single polyp; repeat in 5 years.  [name].  . Allergy consult  07/11/2010    SOB, itching, facial tingling, hypotension.  Allergy testing: no food allergies; +reaction to tree pollen, grass pollen, dust mites, mold.  Avoid diclofenac and similar products.  [name].  [name] consult  07/11/2010    CT chest in ED (gastric thickening).  [name].  No EGD warranted due to lack of symptoms.  . Sleep study  07/11/2010    repeat sleep study due to weight loss.  Severe OSA; CPAP titration to 8 cm.    . Admission  01/2009    [facility].  Syncopal event with nausea, diaphoresis, hypertension.  D-dimer elevated at 2.54 with negative CT chest. and VQ scan.  Cardiac enzymes negative x 3.  Labs normal.  Cardiology consult---cardiolite abn; cath, 2D-echo, holter monitor unrevealing.  SE H&V.  Marland Kitchen Spine surgery    . Left heart catheterization with coronary angiogram N/A 04/02/2014    Procedure: LEFT HEART CATHETERIZATION WITH CORONARY ANGIOGRAM;  Surgeon: Lesleigh Noe, MD;  Location: [facility] CATH LAB;  Service: Cardiovascular;  Laterality: N/A;   Allergies  Allergen Reactions  . Diclofenac Sodium Shortness Of Breath, Itching and Other (See Comments)    Hypotension   . Voltaren [Diclofenac Sodium] Anaphylaxis   Current Outpatient Prescriptions  Medication Sig Dispense Refill  . aspirin EC 81 MG tablet Take 81 mg by mouth daily.    . Calcium-Magnesium-Vitamin D (CALCIUM 500 PO) Take 500 mg by mouth  3 (three) times daily.    . cyclobenzaprine (FLEXERIL) 5 MG tablet TAKE ONE TABLET BY MOUTH EVERY NIGHT AT BEDTIME 30 tablet 3  . famotidine (PEPCID) 20 MG tablet TAKE ONE (1) TABLET BY MOUTH EVERY NIGHTAT BEDTIME 30 tablet 0  . FLUoxetine (PROZAC) 20 MG tablet TAKE 1 TABLET BY MOUTH DAILY  "OFFICE VISIT NEEDED FOR REFILLS" 30 tablet 0  . insulin aspart (NOVOLOG FLEXPEN) 100 UNIT/ML FlexPen Inject 15 Units into the skin 3 (three) times daily with meals. 15 mL 11  . insulin glargine (LANTUS) 100 UNIT/ML injection INJECT 82 UNITS INTO THE SKIN EVERY NIGHT AT BEDTIME AS DIRECTED 30 mL 3  . metFORMIN (GLUCOPHAGE) 1000 MG tablet TAKE ONE TABLET BY MOUTH TWICE DAILY WITH MEALS. 60 tablet 0  . Multiple Vitamin (MULTIVITAMIN) capsule Take 1 capsule by mouth  daily.    . omeprazole (PRILOSEC) 40 MG capsule Take 40 mg by mouth daily.    . sildenafil (VIAGRA) 100 MG tablet Take 100 mg by mouth daily as needed for erectile dysfunction.     . simvastatin (ZOCOR) 40 MG tablet TAKE 1 TABLET BY MOUTH DAILY 30 tablet 1  . spironolactone (ALDACTONE) 25 MG tablet Take 0.5 tablets (12.5 mg total) by mouth daily. 45 tablet 3  . valsartan (DIOVAN) 80 MG tablet Take 1 tablet (80 mg total) by mouth daily. 30 tablet 11  . albuterol (PROVENTIL HFA;VENTOLIN HFA) 108 (90 BASE) MCG/ACT inhaler Inhale 2 puffs into the lungs every 6 (six) hours as needed for wheezing or shortness of breath (cough, shortness of breath or wheezing.). 1 Inhaler 1  . B-D INS SYR ULTRAFINE 1CC/31G 31G X 5/16" 1 ML MISC USE FOR INSULIN INJECTIONS THREE TIMES DAILY 100 each 5  . glucose blood (ONE TOUCH ULTRA TEST) test strip Test blood sugar 3 times daily. Dx code: E11.9 300 each 3  . Insulin Syringe-Needle U-100 (B-D INS SYR ULTRAFINE 1CC/30G) 30G X 1/2" 1 ML MISC 1 each by Does not apply route See admin instructions. Use daily with insulin    . metFORMIN (GLUCOPHAGE) 1000 MG tablet TAKE 1 TABLET BY MOUTH TWICE A DAY WITH FOOD 60 tablet 1   No  current facility-administered medications for this visit.   Social History   Social History  . Marital Status: Married    Spouse Name: N/A  . Number of Children: 3  . Years of Education: N/A   Occupational History  . [employer] maintenance     Supervisor x 23 yrs   Social History Main Topics  . Smoking status: Former Smoker -- 0.25 packs/day for 2 years    Types: Cigarettes  . Smokeless tobacco: Never Used  . Alcohol Use: 0.0 oz/week    0 Standard drinks or equivalent per week     Comment: occasional Beer on ce every two weeks on the weekends  . Drug Use: No  . Sexual Activity: Yes   Other Topics Concern  . Not on file   Social History Narrative   Married x 26 years, happy.       Children: 3 children (27, 27, 24); 3 grandchildren; 1 gg.       Lives: with wife, dog.      Employment: employed.      Tobacco: never      Alcohol: beer twice per month on weekends.      Drugs: none      Exercise: walking three days per week.      Seatbelt: 100% of time      Guns: loaded and secured/locked.      Sexual activity: sexually active with wife.      Sunscreen: yes.   Family History  Problem Relation Age of Onset  . Cancer Father     skin  . Heart disease Father 66    AMI first age 48; stents.  . Diabetes Father   . Hypertension Father   . Hyperlipidemia Father   . Peripheral Artery Disease Father   . Parkinson's disease Father   . Cancer Maternal Uncle     lymphoma  . Cancer Paternal Grandfather     skin  . Diabetes Mother   . Arthritis Mother     total hip replacement  . Kidney failure Mother   . Depression Sister   . Diabetes Sister  Objective:    BP 116/72 mmHg  Pulse 90  Temp(Src) 97.9 F (36.6 C) (Oral)  Resp 16  Ht  (1.854 m)  Wt 298 lb 9.6 oz (135.444 kg)  BMI 39.40 kg/m2 Physical Exam  Constitutional: He is oriented to person, place, and time. He appears well-developed and well-nourished. No distress.  obese  HENT:  Head:  Normocephalic and atraumatic.  Right Ear: External ear normal.  Left Ear: External ear normal.  Nose: Nose normal.  Mouth/Throat: Oropharynx is clear and moist.  Eyes: Conjunctivae and EOM are normal. Pupils are equal, round, and reactive to light.  Neck: Normal range of motion. Neck supple. Carotid bruit is not present. No thyromegaly present.  Cardiovascular: Normal rate, regular rhythm, normal heart sounds and intact distal pulses.  Exam reveals no gallop and no friction rub.   No murmur heard. Pulmonary/Chest: Effort normal and breath sounds normal. He has no wheezes. He has no rales.  Abdominal: Soft. Bowel sounds are normal. He exhibits no distension and no mass. There is no tenderness. There is no rebound and no guarding.  Lymphadenopathy:    He has no cervical adenopathy.  Neurological: He is alert and oriented to person, place, and time. No cranial nerve deficit.  Skin: Skin is warm and dry. No rash noted. He is not diaphoretic.  Psychiatric: He has a normal mood and affect. His behavior is normal.  Nursing note and vitals reviewed.       Assessment & Plan:   1. Type 2 diabetes mellitus without complication, with long-term current use of insulin (HCC)   2. Essential hypertension   3. CHF with left ventricular diastolic dysfunction, NYHA class 1 (HCC)   4. Need for hepatitis C screening test   5. Screening for HIV (human immunodeficiency virus)   6. Dyspnea   7. Obesity   8. OSA on CPAP   9. Pure hypercholesterolemia    1. DMII: uncontrolled; likely etiology to ongoing diaphoresis and DOE; non-compliant with Novolog insulin prior to meals; HgbA1c has not been under control in years.  Increase Lantus to 82 units qhs.   2.  HTN: controlled; obtain labs; continue medication. 3. Hyperlipidemia: controlled; obtain labs; continue medicatoins. 4.  Dyspnea with diaphoresis: persistent and intermittent: recommend trial of Albuterol prior to activity; s/p cardiac evaluation,  pulmonology evaluation, CT chest; no etiology to DOE; feel secondary to uncontrolled diabetes and obesity. 5.  OSA on CPAP: stable; reports compliance with CPAP. 6.  Obesity; continues to lose weight yet DOE continues. 7.  Need for Hepatitis C and HIV; obtain. 8. CHF diastolic: stable; no volume overload to suggest contributing to DOE. 9. S/p Pneumovax.  Orders Placed This Encounter  Procedures  . Pneumococcal polysaccharide vaccine 23-valent greater than or equal to 2yo subcutaneous/IM  . CBC with Differential/Platelet  . Comprehensive metabolic panel    Order Specific Question:  Has the patient fasted?    Answer:  Yes  . Lipid panel    Order Specific Question:  Has the patient fasted?    Answer:  Yes  . HIV antibody  . Hepatitis C antibody  . POCT glucose (manual entry)  . POCT glycosylated hemoglobin (Hb A1C)   Meds ordered this encounter  Medications  . albuterol (PROVENTIL HFA;VENTOLIN HFA) 108 (90 BASE) MCG/ACT inhaler    Sig: Inhale 2 puffs into the lungs every 6 (six) hours as needed for wheezing or shortness of breath (cough, shortness of breath or wheezing.).    Dispense:  1  Inhaler    Refill:  1  . insulin glargine (LANTUS) 100 UNIT/ML injection    Sig: INJECT 82 UNITS INTO THE SKIN EVERY NIGHT AT BEDTIME AS DIRECTED    Dispense:  30 mL    Refill:  3    ADMINISTER QS FOR 30 DAYS    Return in about 3 months (around 08/18/2015) for complete physical examiniation.     Treven Holtman Paulita FujitaMartin Teegan Brandis, M.D. [facility]  [place] [address] [place], [place]  [id number] [phone] phone [phone]) [phone] fax

## 2015-05-19 LAB — HIV ANTIBODY (ROUTINE TESTING W REFLEX): HIV 1&2 Ab, 4th Generation: NONREACTIVE

## 2015-06-10 ENCOUNTER — Other Ambulatory Visit: Payer: Self-pay | Admitting: Family Medicine

## 2015-06-11 ENCOUNTER — Encounter: Payer: Self-pay | Admitting: Family Medicine

## 2015-06-29 ENCOUNTER — Other Ambulatory Visit: Payer: Self-pay | Admitting: Family Medicine

## 2015-07-24 ENCOUNTER — Encounter: Payer: Self-pay | Admitting: Family Medicine

## 2015-07-26 MED ORDER — INSULIN DETEMIR 100 UNIT/ML FLEXPEN: 82.0000 [IU] | PEN_INJECTOR | Freq: Every day | SUBCUTANEOUS | Status: DC

## 2015-08-03 ENCOUNTER — Ambulatory Visit (INDEPENDENT_AMBULATORY_CARE_PROVIDER_SITE_OTHER): Payer: BC Managed Care – PPO | Admitting: Cardiovascular Disease

## 2015-08-03 ENCOUNTER — Encounter: Payer: Self-pay | Admitting: Cardiovascular Disease

## 2015-08-03 VITALS — BP 142/82 | HR 94 | Ht 73.0 in | Wt 309.0 lb

## 2015-08-03 DIAGNOSIS — E785 Hyperlipidemia, unspecified: Secondary | ICD-10-CM

## 2015-08-03 DIAGNOSIS — I10 Essential (primary) hypertension: Secondary | ICD-10-CM

## 2015-08-03 DIAGNOSIS — I2510 Atherosclerotic heart disease of native coronary artery without angina pectoris: Secondary | ICD-10-CM | POA: Diagnosis not present

## 2015-08-03 DIAGNOSIS — I5032 Chronic diastolic (congestive) heart failure: Secondary | ICD-10-CM

## 2015-08-03 NOTE — Patient Instructions (Signed)

## 2015-08-03 NOTE — Progress Notes (Signed)
Chief Complaint  Patient presents with  . Chest Pain     History of Present Illness: 55 y.o. male with a history of HTN, HLD, morbid obesity status post lap band surgery, diabetes mellitus, sleep apnea and CAD who is here today for follow up. I met him in June 2016. He was seen in the emergency room 03/01/14 with chest pain and dyspnea by Dr. Rayann Heman. Cardiac markers were normal. Stress myoview demonstrated apical defect (possible scar). LHC was recommended. This demonstrated mild plaque but no obstructive disease. LVEDP was elevated and continued management of diastolic CHF was recommended.He was seen in follow up by Richardson Dopp, PA-C and was started on Spironolactone 12.5 mg QD. Echo 03/26/14 with normal LV systolic function, grade 1 diastolic dysfunction.   He is here today for follow up. He denies any exertional chest pain. He has occasional dyspnea. No palpitations, near syncope or syncope.   He is a Set designer Care Physician: [name]  Past Medical History  Diagnosis Date  . Diabetes mellitus   . Hyperlipidemia   . Hypertension   . Impotence of organic origin   . Obesity, unspecified   . Allergic rhinitis, cause unspecified   . Other chronic allergic conjunctivitis   . Obstructive sleep apnea (adult) (pediatric)   . Degeneration of intervertebral disc, site unspecified   . Personal history of colonic polyps     Past Surgical History  Procedure Laterality Date  . Laparoscopic gastric banding  09/15/09  . Back surgery      x3  . Quadriceps tendon repair      left  . Quadriceps repair      left  . Rotator cuff repair      right  . Appendectomy    . Tonsillectomy and adenoidectomy  1960  . Arthroscopy of knee  1980  . Colonoscopy  07/11/2009    single polyp; repeat in 5 years.  [name].  . Allergy consult  07/11/2010    SOB, itching, facial tingling, hypotension.  Allergy testing: no food allergies; +reaction to tree pollen, grass pollen, dust  mites, mold.  Avoid diclofenac and similar products.  [name].  [name] consult  07/11/2010    CT chest in ED (gastric thickening).  [name].  No EGD warranted due to lack of symptoms.  . Sleep study  07/11/2010    repeat sleep study due to weight loss.  Severe OSA; CPAP titration to 8 cm.    . Admission  01/2009    [facility].  Syncopal event with nausea, diaphoresis, hypertension.  D-dimer elevated at 2.54 with negative CT chest. and VQ scan.  Cardiac enzymes negative x 3.  Labs normal.  Cardiology consult---cardiolite abn; cath, 2D-echo, holter monitor unrevealing.  SE H&V.  Marland Kitchen Spine surgery    . Left heart catheterization with coronary angiogram N/A 04/02/2014    Procedure: LEFT HEART CATHETERIZATION WITH CORONARY ANGIOGRAM;  Surgeon: [name], MD;  Location: [facility] CATH LAB;  Service: Cardiovascular;  Laterality: N/A;    Current Outpatient Prescriptions  Medication Sig Dispense Refill  . albuterol (PROVENTIL HFA;VENTOLIN HFA) 108 (90 BASE) MCG/ACT inhaler Inhale 2 puffs into the lungs every 6 (six) hours as needed for wheezing or shortness of breath (cough, shortness of breath or wheezing.). 1 Inhaler 1  . aspirin EC 81 MG tablet Take 81 mg by mouth daily.    . B-D INS SYR ULTRAFINE 1CC/31G 31G X 5/16" 1 ML MISC USE FOR INSULIN  INJECTIONS THREE TIMES DAILY 100 each 5  . Calcium-Magnesium-Vitamin D (CALCIUM 500 PO) Take 500 mg by mouth 3 (three) times daily.    . cyclobenzaprine (FLEXERIL) 5 MG tablet TAKE ONE TABLET BY MOUTH EVERY NIGHT AT BEDTIME 30 tablet 3  . famotidine (PEPCID) 20 MG tablet TAKE ONE (1) TABLET BY MOUTH EVERY NIGHTAT BEDTIME 30 tablet 0  . FLUoxetine (PROZAC) 20 MG tablet TAKE 1 TABLET BY MOUTH ONCE A DAY 30 tablet 0  . glucose blood (ONE TOUCH ULTRA TEST) test strip Test blood sugar 3 times daily. Dx code: E11.9 300 each 3  . insulin aspart (NOVOLOG FLEXPEN) 100 UNIT/ML FlexPen Inject 15 Units into the skin 3 (three) times daily with meals. 15 mL 11  . Insulin Detemir  (LEVEMIR) 100 UNIT/ML Pen Inject 82 Units into the skin daily at 10 pm. 15 mL 11  . insulin glargine (LANTUS) 100 UNIT/ML injection INJECT 82 UNITS INTO THE SKIN EVERY NIGHT AT BEDTIME AS DIRECTED 30 mL 3  . Insulin Syringe-Needle U-100 (B-D INS SYR ULTRAFINE 1CC/30G) 30G X 1/2" 1 ML MISC 1 each by Does not apply route See admin instructions. Use daily with insulin    . metFORMIN (GLUCOPHAGE) 1000 MG tablet TAKE ONE TABLET BY MOUTH TWICE DAILY WITH MEALS. 60 tablet 0  . Multiple Vitamin (MULTIVITAMIN) capsule Take 1 capsule by mouth daily.    Marland Kitchen omeprazole (PRILOSEC) 40 MG capsule Take 40 mg by mouth daily.    . sildenafil (VIAGRA) 100 MG tablet Take 100 mg by mouth daily as needed for erectile dysfunction.     . simvastatin (ZOCOR) 40 MG tablet TAKE 1 TABLET BY MOUTH DAILY 30 tablet 1  . spironolactone (ALDACTONE) 25 MG tablet Take 0.5 tablets (12.5 mg total) by mouth daily. 45 tablet 3  . valsartan (DIOVAN) 80 MG tablet Take 1 tablet (80 mg total) by mouth daily. 30 tablet 11   No current facility-administered medications for this visit.    Allergies  Allergen Reactions  . Diclofenac Sodium Shortness Of Breath, Itching and Other (See Comments)    Hypotension   . Voltaren [Diclofenac Sodium] Anaphylaxis    Social History   Social History  . Marital Status: Married    Spouse Name: N/A  . Number of Children: 3  . Years of Education: N/A   Occupational History  . [employer] bridge maintenance     Supervisor x 23 yrs   Social History Main Topics  . Smoking status: Former Smoker -- 0.25 packs/day for 2 years    Types: Cigarettes  . Smokeless tobacco: Never Used  . Alcohol Use: 0.0 oz/week    0 Standard drinks or equivalent per week     Comment: occasional Beer on ce every two weeks on the weekends  . Drug Use: No  . Sexual Activity: Yes   Other Topics Concern  . Not on file   Social History Narrative   Married x 26 years, happy.       Children: 3 children (27, 27, 24); 3  grandchildren; 1 gg.       Lives: with wife, dog.      Employment: employed.      Tobacco: never      Alcohol: beer twice per month on weekends.      Drugs: none      Exercise: walking three days per week.      Seatbelt: 100% of time      Guns: loaded and secured/locked.  Sexual activity: sexually active with wife.      Sunscreen: yes.    Family History  Problem Relation Age of Onset  . Cancer Father     skin  . Heart disease Father 47    AMI first age 85; stents.  . Diabetes Father   . Hypertension Father   . Hyperlipidemia Father   . Peripheral Artery Disease Father   . Parkinson's disease Father   . Cancer Maternal Uncle     lymphoma  . Cancer Paternal Grandfather     skin  . Diabetes Mother   . Arthritis Mother     total hip replacement  . Kidney failure Mother   . Depression Sister   . Diabetes Sister     Review of Systems:  As stated in the HPI and otherwise negative.   BP 142/82 mmHg  Pulse 94  Ht _0  (1.854 m)  Wt 309 lb (140.161 kg)  BMI 40.78 kg/m2  Physical Examination: General: Well developed, well nourished, NAD HEENT: OP clear, mucus membranes moist SKIN: warm, dry. No rashes. Neuro: No focal deficits Musculoskeletal: Muscle strength 5/5 all ext Psychiatric: Mood and affect normal Neck: No JVD, no carotid bruits, no thyromegaly, no lymphadenopathy. Lungs:Clear bilaterally, no wheezes, rhonci, crackles Cardiovascular: Regular rate and rhythm. No murmurs, gallops or rubs. Abdomen:Soft. Bowel sounds present. Non-tender.  Extremities: No lower extremity edema. Pulses are 2 + in the bilateral DP/PT.   Cardiac cath 04/02/14: HEMODYNAMICS: Aortic pressure 130/69 mmHg; LV pressure 146/14 mmHg; LVEDP 22 mm mercury ANGIOGRAPHIC DATA: The left main coronary artery is widely patent/normal. The left anterior descending artery is proximally calcified free of any significant obstruction. The LAD wraps around the left ventricular apex. A large first  diagonal arises and has ostial to proximal 30% narrowing. . The left circumflex artery is widely patent and normal. Gives origin to 3 obtuse marginal branches with the first and second being the larger of the 3. The right coronary artery is widely patent with distal 30% narrowing.Marland Kitchen LEFT VENTRICULOGRAM: Left ventricular angiogram was done in the 30 RAO projection and revealed normal cavity size and contractility with an EF of 60%  Echo 03/26/14: Procedure narrative: Transthoracic echocardiography for left ventricular function evaluation, for right ventricular function evaluation, for assessment of valvular function, and for preoperative diagnosis. Image quality was suboptimal. The study was technically difficult, as a result of poor acoustic windows and poor sound wave transmission. Intravenous contrast (Definity) was administered to enhance regional wall motion assessment and opacify the LV. - Left ventricle: The cavity size was normal. There was moderate concentric hypertrophy. Systolic function was normal. The estimated ejection fraction was in the range of 60% to 65%. Images were inadequate for LV wall motion assessment. Doppler parameters are consistent with abnormal left ventricular relaxation (grade 1 diastolic dysfunction). The E/e&' ratio is between 8-15, suggesting indeterminate LV filling pressure. - Left atrium: The atrium was normal in size.  EKG:  EKG is ordered today. The ekg ordered today demonstrates NSR, rate 92 bpm.   Recent Labs: 05/18/2015: ALT 12; BUN 17; Creat 0.74; Hemoglobin 13.5; Platelets 319; Potassium 4.1; Sodium 141   Lipid Panel    Component Value Date/Time   CHOL 172 05/18/2015 1316   TRIG 108 05/18/2015 1316   TRIG 88 07/24/2006 1009   HDL 44 05/18/2015 1316   CHOLHDL 3.9 05/18/2015 1316   CHOLHDL 4.2 CALC 07/24/2006 1009   VLDL 22 05/18/2015 1316   LDLCALC 106 05/18/2015 1316  Wt Readings from Last 3 Encounters:    08/03/15 309 lb (140.161 kg)  05/18/15 298 lb 9.6 oz (135.444 kg)  01/02/15 308 lb (139.708 kg)     Other studies Reviewed: Additional studies/ records that were reviewed today include: . Review of the above records demonstrates:    Assessment and Plan:   1. Chronic diastolic SCB:IPJRPZ is stable. No LE edema. Continue aldactone.   2. Coronary artery disease:Mild plaque by cardiac catheterization 2015. No angina. Continue aspirin, statin.   3. Essential hypertension:BP well controlled at home. Continue current therapy.    4. Dyslipidemia: Managed by primary care. Continue statin.  Current medicines are reviewed at length with the patient today.  The patient does not have concerns regarding medicines.  The following changes have been made:  no change  Labs/ tests ordered today include:   Orders Placed This Encounter  Procedures  . EKG 12-Lead    Disposition:   F/U with me in 12 months  Signed, Lauree Chandler, MD 08/03/2015 9:53 AM    [facility] [place], [place], [place]  [address] Phone: [phone]; Fax: [phone]

## 2015-08-10 ENCOUNTER — Other Ambulatory Visit: Payer: Self-pay | Admitting: Family Medicine

## 2015-08-21 ENCOUNTER — Ambulatory Visit: Payer: BC Managed Care – PPO | Admitting: Family Medicine

## 2015-09-01 ENCOUNTER — Ambulatory Visit: Payer: BC Managed Care – PPO | Admitting: Family Medicine

## 2015-09-18 ENCOUNTER — Other Ambulatory Visit: Payer: Self-pay | Admitting: Family Medicine

## 2015-10-13 ENCOUNTER — Telehealth: Payer: Self-pay | Admitting: *Deleted

## 2015-10-13 NOTE — Telephone Encounter (Signed)
Dr. Katrinka BlazingSmith, [facility] stating an rx was sent over for this pt to have Insulin Detemir 82 units once daily.  However, they advised the way it was written the pt has to pay 2 copays with his insurance.  They wanted to know if the rx can be written to indicate 82 unites or as directed.  This way it allows him to have more units or per say.  She stated if you needed to call her please do.  I advised her to go ahead to avoid the pt of having to pay 2 copays.

## 2015-10-15 MED ORDER — INSULIN DETEMIR 100 UNIT/ML FLEXPEN: 82.0000 [IU] | PEN_INJECTOR | Freq: Every day | SUBCUTANEOUS | Status: DC

## 2015-10-15 NOTE — Telephone Encounter (Signed)
Yes. Agree.

## 2015-10-20 ENCOUNTER — Other Ambulatory Visit: Payer: Self-pay | Admitting: Family Medicine

## 2015-10-27 ENCOUNTER — Other Ambulatory Visit: Payer: Self-pay | Admitting: Family Medicine

## 2015-11-03 ENCOUNTER — Ambulatory Visit (INDEPENDENT_AMBULATORY_CARE_PROVIDER_SITE_OTHER): Payer: BC Managed Care – PPO | Admitting: Family Medicine

## 2015-11-03 ENCOUNTER — Encounter: Payer: Self-pay | Admitting: Family Medicine

## 2015-11-03 VITALS — BP 122/76 | HR 85 | Temp 98.5°F | Ht 73.0 in | Wt 310.0 lb

## 2015-11-03 DIAGNOSIS — Z794 Long term (current) use of insulin: Secondary | ICD-10-CM

## 2015-11-03 DIAGNOSIS — E119 Type 2 diabetes mellitus without complications: Secondary | ICD-10-CM

## 2015-11-03 DIAGNOSIS — R5383 Other fatigue: Secondary | ICD-10-CM

## 2015-11-03 MED ORDER — VALSARTAN 80 MG PO TABS
40.0000 mg | ORAL_TABLET | Freq: Every day | ORAL | Status: DC
Start: 2015-11-03 — End: 2016-03-28

## 2015-11-03 NOTE — Patient Instructions (Addendum)
Go to the lab on the way out.  We'll contact you with your lab report. Call about an eye exam.  Don't change your meds for now but start checking your sugar more, before and after meals.   Cut the valsartan in half in the meantime, down to 40mg  a day.  See if that helps.  You can always go back up to a full pill if your BP is too high or if the lower dose doesn't help you feel better.   Let me look back at your records in the meantime.  We'll go from there.   Take care.  Glad to see you.

## 2015-11-03 NOTE — Progress Notes (Signed)
Pre visit review using our clinic review tool, if applicable. No additional management support is needed unless otherwise documented below in the visit note.  DM2.  Not checking sugar often, usually ~150 in the AM.  Due for labs.  D/w compliant with labs. Due for eye exam.    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  Was checked and treated, on doxycycline.  He had inc in sweats, more SOB, dizziness, dec in hearing in the interval.  Some days his breathing is worse than others.  If he tries to use his arms, ie repetitive upper body work, he'll get more SOB.  He has more trouble with upper body work than with walking/lower body work.  He doesn't have true focal weakness in the ext.  He hasn't been checking his sugar during these episodes.  He seems to do some better after cutting his BP meds prev.  No fevers.  He is fatigued.    He has had heart and lung eval prev.  Had allergy eval done prev.    He has had some episodes of vertigo over the years, with room spinning, but that seems to be a separate issue.   PMH and SH reviewed  ROS: See HPI, otherwise noncontributory.  Meds, vitals, and allergies reviewed.   GEN: nad, alert and oriented HEENT: mucous membranes moist NECK: supple w/o LA CV: rrr. PULM: ctab, no inc wob ABD: soft, +bs EXT: no edema SKIN: no acute rash

## 2015-11-04 LAB — CBC WITH DIFFERENTIAL/PLATELET
Basophils Absolute: 0 10*3/uL (ref 0.0–0.1)
Basophils Relative: 0.4 % (ref 0.0–3.0)
Eosinophils Absolute: 0.2 10*3/uL (ref 0.0–0.7)
Eosinophils Relative: 1.9 % (ref 0.0–5.0)
HEMATOCRIT: 40.5 % (ref 39.0–52.0)
HEMOGLOBIN: 13.6 g/dL (ref 13.0–17.0)
LYMPHS PCT: 34.2 % (ref 12.0–46.0)
Lymphs Abs: 3.1 10*3/uL (ref 0.7–4.0)
MCHC: 33.6 g/dL (ref 30.0–36.0)
MCV: 88.3 fl (ref 78.0–100.0)
MONOS PCT: 4.3 % (ref 3.0–12.0)
Monocytes Absolute: 0.4 10*3/uL (ref 0.1–1.0)
Neutro Abs: 5.4 10*3/uL (ref 1.4–7.7)
Neutrophils Relative %: 59.2 % (ref 43.0–77.0)
Platelets: 300 10*3/uL (ref 150.0–400.0)
RBC: 4.59 Mil/uL (ref 4.22–5.81)
RDW: 13.3 % (ref 11.5–15.5)
WBC: 9.1 10*3/uL (ref 4.0–10.5)

## 2015-11-04 LAB — BASIC METABOLIC PANEL
BUN: 17 mg/dL (ref 6–23)
CHLORIDE: 101 meq/L (ref 96–112)
CO2: 29 mEq/L (ref 19–32)
Calcium: 9.7 mg/dL (ref 8.4–10.5)
Creatinine, Ser: 1.02 mg/dL (ref 0.40–1.50)
GFR: 80.14 mL/min (ref >60.00)
Glucose, Bld: 282 mg/dL — ABNORMAL HIGH (ref 70–99)
POTASSIUM: 4.6 meq/L (ref 3.5–5.1)
SODIUM: 138 meq/L (ref 135–145)

## 2015-11-04 LAB — HEMOGLOBIN A1C: Hgb A1c MFr Bld: 14.2 % — ABNORMAL HIGH (ref 4.6–6.5)

## 2015-11-04 LAB — TSH: TSH: 3.06 u[IU]/mL (ref 0.35–4.50)

## 2015-11-05 NOTE — Assessment & Plan Note (Signed)
At this point, I'm not sure how much of his sx are related to DM/neuropathy/etc vs another cause.  I'll work to review is records in detail, in meantime see notes on labs.  >25 minutes spent in face to face time with patient, >50% spent in counselling or coordination of care.  It may be reasonable to cut his valsartan just to see if that helps his fatigue.

## 2015-11-08 ENCOUNTER — Encounter: Payer: Self-pay | Admitting: Family Medicine

## 2015-11-09 ENCOUNTER — Other Ambulatory Visit: Payer: Self-pay | Admitting: *Deleted

## 2015-11-09 DIAGNOSIS — I5032 Chronic diastolic (congestive) heart failure: Secondary | ICD-10-CM

## 2015-11-09 MED ORDER — SPIRONOLACTONE 25 MG PO TABS: 12.5000 mg | ORAL_TABLET | Freq: Every day | ORAL | Status: DC

## 2015-11-12 ENCOUNTER — Telehealth: Payer: Self-pay | Admitting: Family Medicine

## 2015-11-12 NOTE — Telephone Encounter (Signed)
I'll check the hard copy. Thanks.  

## 2015-11-12 NOTE — Telephone Encounter (Signed)
[name] stopped by the office and dropped off a sheet of his most recent "readings." He wants Dr. Para Marchuncan to see this. I'll leave the paper in Dr. Lianne Bushyuncan's box up front. Thank you.

## 2015-11-12 NOTE — Telephone Encounter (Signed)
Placed in Dr. Duncan's In Box. 

## 2015-11-13 NOTE — Telephone Encounter (Signed)
Call pt.  His AM sugars are not that bad, usually 140-180s.  The post meal sugars are a lot higher.   Is he still using his mealtime insulin?   How much is he taking? That is likely what we'll have to adjust.   Let me know.  Thanks.

## 2015-11-13 NOTE — Telephone Encounter (Addendum)
Patient advised.   Patient says he is using his mealtime insulin at 15 units but has problems sometimes with the lunch time dose if he is not at a convenient spot to take it.  He does always take his breakfast and supper dose.

## 2015-11-15 MED ORDER — INSULIN ASPART 100 UNIT/ML FLEXPEN: 18.0000 [IU] | PEN_INJECTOR | Freq: Three times a day (TID) | SUBCUTANEOUS | Status: DC

## 2015-11-15 NOTE — Addendum Note (Signed)
Addended by: Joaquim NamUNCAN, GRAHAM S on: 11/15/2015 11:15 PM   Modules accepted: Orders

## 2015-11-15 NOTE — Telephone Encounter (Signed)
I would up the mealtime insulin to 18 units and see if he can find a way to get the lunchtime dose in.   Please update me with sugars in about 7-10 days.  Thanks.

## 2015-11-16 NOTE — Telephone Encounter (Signed)
Patient advised.

## 2015-12-02 ENCOUNTER — Telehealth: Payer: Self-pay

## 2015-12-02 NOTE — Telephone Encounter (Signed)
[name] [phone]  [name] dropped off some recordings of his Blood Sugar counts. Placed in RX Box up front.

## 2015-12-02 NOTE — Telephone Encounter (Signed)
Blood sugar readings are on your desk.

## 2015-12-03 ENCOUNTER — Encounter: Payer: Self-pay | Admitting: *Deleted

## 2015-12-03 MED ORDER — INSULIN ASPART 100 UNIT/ML FLEXPEN: 20.0000 [IU] | PEN_INJECTOR | Freq: Three times a day (TID) | SUBCUTANEOUS | Status: DC

## 2015-12-03 NOTE — Telephone Encounter (Signed)
Patient advised.   Grid put in the mail to patient.

## 2015-12-03 NOTE — Telephone Encounter (Signed)
He still has elevated sugars after meals.   I would up the mealtime insulin to 20 units.  Have him fill out the grid with pre/post sugars and drop that off.   We'll go from there.

## 2015-12-14 ENCOUNTER — Encounter: Payer: Self-pay | Admitting: Family Medicine

## 2015-12-14 ENCOUNTER — Telehealth: Payer: Self-pay | Admitting: Family Medicine

## 2015-12-14 ENCOUNTER — Other Ambulatory Visit: Payer: Self-pay | Admitting: *Deleted

## 2015-12-14 MED ORDER — CYCLOBENZAPRINE HCL 5 MG PO TABS: 5.0000 mg | ORAL_TABLET | Freq: Every day | ORAL | Status: DC

## 2015-12-14 NOTE — Telephone Encounter (Signed)
MyChart refill request.  Last Filled:    30 tablet 3 11/05/2014  Please advise.

## 2015-12-14 NOTE — Telephone Encounter (Signed)
I sent the Rx to the pharmacy.

## 2015-12-14 NOTE — Telephone Encounter (Signed)
Please check on him re: updated sugar readings.   Notify pt about reviewing his old records.  I still think it is likely that his prev sx were at least partially related to uncontrolled DM2.

## 2015-12-15 NOTE — Telephone Encounter (Signed)
Left detailed message on voicemail.  

## 2015-12-22 ENCOUNTER — Telehealth: Payer: Self-pay | Admitting: Family Medicine

## 2015-12-22 NOTE — Telephone Encounter (Signed)
Notify pt.  In general, his sugars are way better on the last set of readings.  I would continue as is.  And recheck A1c in about 6 weeks before a visit. Thanks.

## 2015-12-22 NOTE — Telephone Encounter (Signed)
Patient advised.  Lab appt scheduled and 6 weeks OV scheduled.

## 2016-01-05 ENCOUNTER — Ambulatory Visit: Payer: BC Managed Care – PPO | Admitting: Family Medicine

## 2016-01-11 ENCOUNTER — Other Ambulatory Visit: Payer: Self-pay | Admitting: Family Medicine

## 2016-01-11 ENCOUNTER — Other Ambulatory Visit: Payer: Self-pay | Admitting: Primary Care

## 2016-01-11 ENCOUNTER — Other Ambulatory Visit: Payer: Self-pay | Admitting: Internal Medicine

## 2016-02-05 ENCOUNTER — Other Ambulatory Visit (INDEPENDENT_AMBULATORY_CARE_PROVIDER_SITE_OTHER): Payer: BC Managed Care – PPO

## 2016-02-05 DIAGNOSIS — Z794 Long term (current) use of insulin: Secondary | ICD-10-CM

## 2016-02-05 DIAGNOSIS — E119 Type 2 diabetes mellitus without complications: Secondary | ICD-10-CM | POA: Diagnosis not present

## 2016-02-05 LAB — HEMOGLOBIN A1C: Hgb A1c MFr Bld: 12 % — ABNORMAL HIGH (ref 4.6–6.5)

## 2016-02-05 NOTE — Addendum Note (Signed)
Addended by: Alvina Chou on: 02/05/2016 09:38 AM   Modules accepted: Orders

## 2016-02-09 ENCOUNTER — Encounter: Payer: Self-pay | Admitting: Family Medicine

## 2016-02-09 ENCOUNTER — Ambulatory Visit (INDEPENDENT_AMBULATORY_CARE_PROVIDER_SITE_OTHER): Payer: BC Managed Care – PPO | Admitting: Family Medicine

## 2016-02-09 DIAGNOSIS — Z794 Long term (current) use of insulin: Secondary | ICD-10-CM | POA: Diagnosis not present

## 2016-02-09 DIAGNOSIS — E119 Type 2 diabetes mellitus without complications: Secondary | ICD-10-CM | POA: Diagnosis not present

## 2016-02-09 NOTE — Patient Instructions (Signed)
Keep the mealtime insulin as is.   If you have an early AM sugar less than 100, then cut the next dose of levemir by 1 unit.  You can adjust by 1 unit per day if needed.  If 100-140 in the AM, no change in levemir.   If >140 in the AM, then add 1 unit to next dose of levemir.  When your levemir dose and AM sugars have levelled off, then fill out the grid with before/after sugars and send me that.  Take care.  Glad to see you.  Plan on recheck in 3 months but I want to hear from you sooner with your sugars.  Thanks for your effort.

## 2016-02-09 NOTE — Progress Notes (Signed)
Dm2.  82 units of levemir daily.  Trying to take novolog with meals, missing some doses at lunch, 20 units per dose.  D/w pt.   A1c still high at 12, but improved from prev.    Some SOB prev, resolved now.  Noted more when hot, working outside.  No CP.  No sx of SOB now.  Not wheezing.  He can walk on flat ground w/o troubles.  This has been going on in a stable fashion for the last few years, with prev heart cath noted and cards notes reviewed.   Initial weight with steel toe boots.  Recheck 309.5 lbs.  Initially 315.25 lbs.    Meds, vitals, and allergies reviewed.   ROS: Per HPI unless specifically indicated in ROS section   GEN: nad, alert and oriented HEENT: mucous membranes moist NECK: supple w/o LA CV: rrr. PULM: ctab, no inc wob ABD: soft, +bs EXT: no edema SKIN: no acute rash

## 2016-02-09 NOTE — Progress Notes (Signed)
Pre visit review using our clinic review tool, if applicable. No additional management support is needed unless otherwise documented below in the visit note. 

## 2016-02-10 NOTE — Assessment & Plan Note (Signed)
dw pt about dosing insulin.  Some lows, down to 70s recently in the early AM.  He is aware of episodes.   D/w pt: If an early AM sugar less than 100, then cut the next dose of levemir by 1 unit.  You can adjust by 1 unit per day if needed.  If 100-140 in the AM, no change in levemir.   If >140 in the AM, then add 1 unit to next dose of levemir.  When levemir dose and AM sugars have levelled off, then he'll fill out the grid with before/after sugars and send me that.  We'll adjust at that point.  Plan on recheck in 3 months but I want to hear him you sooner with sugars.  Unclear how much his A1c lags his progress, I hope it lags quite a bit.  D/w pt.  >25 minutes spent in face to face time with patient, >50% spent in counselling or coordination of care.

## 2016-02-29 ENCOUNTER — Other Ambulatory Visit: Payer: Self-pay | Admitting: Family Medicine

## 2016-02-29 NOTE — Telephone Encounter (Signed)
Sent. Thanks.   

## 2016-03-15 ENCOUNTER — Other Ambulatory Visit: Payer: Self-pay | Admitting: Family Medicine

## 2016-03-28 ENCOUNTER — Other Ambulatory Visit: Payer: Self-pay | Admitting: Cardiovascular Disease

## 2016-03-30 ENCOUNTER — Other Ambulatory Visit: Payer: Self-pay | Admitting: *Deleted

## 2016-03-30 ENCOUNTER — Encounter: Payer: Self-pay | Admitting: Family Medicine

## 2016-03-30 MED ORDER — METFORMIN HCL 1000 MG PO TABS: ORAL_TABLET | ORAL | 5 refills | Status: DC

## 2016-03-31 ENCOUNTER — Other Ambulatory Visit: Payer: Self-pay | Admitting: *Deleted

## 2016-04-08 ENCOUNTER — Encounter (HOSPITAL_COMMUNITY): Payer: Self-pay

## 2016-04-13 ENCOUNTER — Encounter (HOSPITAL_COMMUNITY): Payer: Self-pay

## 2016-05-11 ENCOUNTER — Ambulatory Visit (INDEPENDENT_AMBULATORY_CARE_PROVIDER_SITE_OTHER): Payer: BC Managed Care – PPO | Admitting: Family Medicine

## 2016-05-11 ENCOUNTER — Encounter: Payer: Self-pay | Admitting: Family Medicine

## 2016-05-11 VITALS — BP 138/74 | HR 82 | Temp 97.8°F | Wt 303.5 lb

## 2016-05-11 DIAGNOSIS — I10 Essential (primary) hypertension: Secondary | ICD-10-CM

## 2016-05-11 DIAGNOSIS — E119 Type 2 diabetes mellitus without complications: Secondary | ICD-10-CM | POA: Diagnosis not present

## 2016-05-11 DIAGNOSIS — Z794 Long term (current) use of insulin: Secondary | ICD-10-CM | POA: Diagnosis not present

## 2016-05-11 DIAGNOSIS — E785 Hyperlipidemia, unspecified: Secondary | ICD-10-CM | POA: Diagnosis not present

## 2016-05-11 MED ORDER — INSULIN ASPART 100 UNIT/ML FLEXPEN: 20.0000 [IU] | PEN_INJECTOR | Freq: Three times a day (TID) | SUBCUTANEOUS | 11 refills | Status: DC

## 2016-05-11 MED ORDER — VALSARTAN 80 MG PO TABS: 40.0000 mg | ORAL_TABLET | Freq: Every day | ORAL | Status: DC

## 2016-05-11 MED ORDER — GLUCOSE BLOOD VI STRP: ORAL_STRIP | 3 refills | Status: DC

## 2016-05-11 NOTE — Progress Notes (Signed)
Pre visit review using our clinic review tool, if applicable. No additional management support is needed unless otherwise documented below in the visit note. 

## 2016-05-11 NOTE — Patient Instructions (Signed)
Go to the lab on the way out.  We'll contact you with your lab report. Don't change your meds for now but we'll likely have to increase your mealtime dosing.  Plan on a recheck in about 3 months.  We'll be in touch in the meantime.  Take care.  Glad to see you.

## 2016-05-11 NOTE — Assessment & Plan Note (Addendum)
We willl likely need to increase his mealtime insulin. I want to get his A1c resulted and review that first before we make an chanes. Currently taking 20 units with each meal.  See notes on labs.  >25 minutes spent in face to face time with patient, >50% spent in counselling or coordination of care.

## 2016-05-11 NOTE — Progress Notes (Signed)
Diabetes:  Using medications without difficulties:yes Hypoglycemic episodes:no Hyperglycemic episodes:no Feet problems:no changes.  Prev with L foot numbness from prev back problems.  Blood Sugars averaging: usually 120-150 in the AM.  Usually 200 or higher before and then 2 hours after lunch and supper.   eye exam within last year:due, d/w pt.  Labs pending.    Hypertension:    Using medication without problems or lightheadedness: yes Chest pain with exertion:no Edema:no Short of breath: occ SOB prev but this is less than prev.  He had felt well recently, ie improved.  He has been able to cut wood with an axe w/o troubles.    Elevated Cholesterol: Using medications without problems:yes Muscle aches: no Diet compliance: encouraged, he is working on adherence Exercise: encouraged, he is working on adherence  PMH and SH reviewed  Meds, vitals, and allergies reviewed.   ROS: Per HPI unless specifically indicated in ROS section   GEN: nad, alert and oriented HEENT: mucous membranes moist NECK: supple w/o LA CV: rrr. PULM: ctab, no inc wob ABD: soft, +bs EXT: no edema SKIN: no acute rash  Diabetic foot exam: Normal inspection No skin breakdown No calluses  Normal DP pulses Normal sensation to light touch and monofilament on R foot, dec sensation along L lateral foot.   Nails absent on B 1st toes.

## 2016-05-12 ENCOUNTER — Telehealth: Payer: Self-pay | Admitting: *Deleted

## 2016-05-12 DIAGNOSIS — IMO0001 Reserved for inherently not codable concepts without codable children: Secondary | ICD-10-CM

## 2016-05-12 DIAGNOSIS — Z794 Long term (current) use of insulin: Principal | ICD-10-CM

## 2016-05-12 DIAGNOSIS — E1165 Type 2 diabetes mellitus with hyperglycemia: Principal | ICD-10-CM

## 2016-05-12 LAB — BASIC METABOLIC PANEL
BUN: 13 mg/dL (ref 6–23)
CHLORIDE: 95 meq/L — AB (ref 96–112)
CO2: 29 mEq/L (ref 19–32)
Calcium: 9.4 mg/dL (ref 8.4–10.5)
Creatinine, Ser: 0.98 mg/dL (ref 0.40–1.50)
GFR: 83.77 mL/min (ref >60.00)
Glucose, Bld: 583 mg/dL (ref 70–99)
POTASSIUM: 4.9 meq/L (ref 3.5–5.1)
SODIUM: 130 meq/L — AB (ref 135–145)

## 2016-05-12 LAB — HEMOGLOBIN A1C: HEMOGLOBIN A1C: 14.6 % — AB (ref 4.6–6.5)

## 2016-05-12 NOTE — Telephone Encounter (Signed)
Call pt.  I think his meter is off.  He needs to calibrate his or get a new meter.   Sugar >500 here yesterday.  A1c still really high.  Needs to inc mealtime insulin to 25 units per meal for 2 days, then 30 units per meal.  Drink plenty of water.  I think he should see endocrine.  Will he consent to referral? Let me know.   Thanks.

## 2016-05-12 NOTE — Telephone Encounter (Signed)
Patient advised and understands instructions.  Patient will look into getting another meter.   Patient is willing to see endocrine.

## 2016-05-12 NOTE — Assessment & Plan Note (Signed)
Reasonably controlled on current medications. Continue work on diet and exercise.

## 2016-05-12 NOTE — Telephone Encounter (Signed)
Bronson CurbGil from [facility] called with a critical lab result. Glucose was 583. Blood was drawn yesterday evening after 5:30, and sent to the lab this am. Result given to Dr. Para Marchuncan.

## 2016-05-12 NOTE — Assessment & Plan Note (Signed)
No adverse effect on medication. Continue statin.

## 2016-05-12 NOTE — Telephone Encounter (Signed)
I put in the referral.  Thanks.  Shirlee LimerickMarion please let me know when the appointment is so I can make plans about interval DM2 f/u.  Thanks.

## 2016-05-20 ENCOUNTER — Telehealth: Payer: Self-pay | Admitting: Family Medicine

## 2016-05-20 NOTE — Telephone Encounter (Signed)
Pt said his BS are doing pretty good he said he hasn't had any low readings. He wasn't at home to look at his log but he said this am it was 150 and yesterday afternoon it was 220 (that's what he could remember)

## 2016-05-20 NOTE — Telephone Encounter (Signed)
Pt notified of Dr. Duncan's comments and verbalized understanding  

## 2016-05-20 NOTE — Telephone Encounter (Signed)
How is his sugar running with 30 units per meal?  Let me know so we can make some changes between now and his endocrine appointment.  Thanks.

## 2016-05-20 NOTE — Telephone Encounter (Signed)
Then I would continue as is and keep the appointment with endo.  If sig changes in the meantime, then update me.  Thanks.

## 2016-06-21 ENCOUNTER — Encounter: Payer: Self-pay | Admitting: Internal Medicine

## 2016-06-21 ENCOUNTER — Ambulatory Visit (INDEPENDENT_AMBULATORY_CARE_PROVIDER_SITE_OTHER): Payer: BC Managed Care – PPO | Admitting: Internal Medicine

## 2016-06-21 VITALS — BP 128/82 | HR 87 | Ht 73.0 in | Wt 301.0 lb

## 2016-06-21 DIAGNOSIS — E1159 Type 2 diabetes mellitus with other circulatory complications: Secondary | ICD-10-CM

## 2016-06-21 DIAGNOSIS — E1165 Type 2 diabetes mellitus with hyperglycemia: Secondary | ICD-10-CM

## 2016-06-21 MED ORDER — INSULIN DETEMIR 100 UNIT/ML FLEXPEN: 75.0000 [IU] | PEN_INJECTOR | Freq: Every day | SUBCUTANEOUS | 5 refills | Status: DC

## 2016-06-21 MED ORDER — DULAGLUTIDE 0.75 MG/0.5ML ~~LOC~~ SOAJ: SUBCUTANEOUS | 1 refills | Status: DC

## 2016-06-21 NOTE — Patient Instructions (Addendum)
Please continue: - Metformin 1000 mg 2x a day, with meals - Novolog 20-30 units 3x a day  Please decrease: - Levemir to 75 units at night  Add: - Trulicity 0.75 mg weekly. Call me in 3 weeks to let me know if I need to send the prescription for the higher dose (1.5 mg) to your pharmacy.  Please return in 1.5 months with your sugar log.   PATIENT INSTRUCTIONS FOR TYPE 2 DIABETES:  **Please join MyChart!** - see attached instructions about how to join if you have not done so already.  DIET AND EXERCISE Diet and exercise is an important part of diabetic treatment.  We recommended aerobic exercise in the form of brisk walking (working between 40-60% of maximal aerobic capacity, similar to brisk walking) for 150 minutes per week (such as 30 minutes five days per week) along with 3 times per week performing 'resistance' training (using various gauge rubber tubes with handles) 5-10 exercises involving the major muscle groups (upper body, lower body and core) performing 10-15 repetitions (or near fatigue) each exercise. Start at half the above goal but build slowly to reach the above goals. If limited by weight, joint pain, or disability, we recommend daily walking in a swimming pool with water up to waist to reduce pressure from joints while allow for adequate exercise.    BLOOD GLUCOSES Monitoring your blood glucoses is important for continued management of your diabetes. Please check your blood glucoses 2-4 times a day: fasting, before meals and at bedtime (you can rotate these measurements - e.g. one day check before the 3 meals, the next day check before 2 of the meals and before bedtime, etc.).   HYPOGLYCEMIA (low blood sugar) Hypoglycemia is usually a reaction to not eating, exercising, or taking too much insulin/ other diabetes drugs.  Symptoms include tremors, sweating, hunger, confusion, headache, etc. Treat IMMEDIATELY with 15 grams of Carbs: . 4 glucose tablets .  cup regular  juice/soda . 2 tablespoons raisins . 4 teaspoons sugar . 1 tablespoon honey Recheck blood glucose in 15 mins and repeat above if still symptomatic/blood glucose <100.  RECOMMENDATIONS TO REDUCE YOUR RISK OF DIABETIC COMPLICATIONS: * Take your prescribed MEDICATION(S) * Follow a DIABETIC diet: Complex carbs, fiber rich foods, (monounsaturated and polyunsaturated) fats * AVOID saturated/trans fats, high fat foods, >2,300 mg salt per day. * EXERCISE at least 5 times a week for 30 minutes or preferably daily.  * DO NOT SMOKE OR DRINK more than 1 drink a day. * Check your FEET every day. Do not wear tightfitting shoes. Contact us if you develop an ulcer * See your EYE doctor once a year or more if needed * Get a FLU shot once a year * Get a PNEUMONIA vaccine once before and once after age 57 years  GOALS:  * Your Hemoglobin A1c of <7%  * fasting sugars need to be <130 * after meals sugars need to be <180 (2h after you start eating) * Your Systolic BP should be 140 or lower  * Your Diastolic BP should be 80 or lower  * Your HDL (Good Cholesterol) should be 40 or higher  * Your LDL (Bad Cholesterol) should be 100 or lower. * Your Triglycerides should be 150 or lower  * Your Urine microalbumin (kidney function) should be <30 * Your Body Mass Index should be 25 or lower    Please consider the following ways to cut down carbs and fat and increase fiber and micronutrients in your  diet: - substitute whole grain for white bread or pasta - substitute brown rice for white rice - substitute 90-calorie flat bread pieces for slices of bread when possible - substitute sweet potatoes or yams for white potatoes - substitute humus for margarine - substitute tofu for cheese when possible - substitute almond or rice milk for regular milk (would not drink soy milk daily due to concern for soy estrogen influence on breast cancer risk) - substitute dark chocolate for other sweets when possible -  substitute water - can add lemon or orange slices for taste - for diet sodas (artificial sweeteners will trick your body that you can eat sweets without getting calories and will lead you to overeating and weight gain in the long run) - do not skip breakfast or other meals (this will slow down the metabolism and will result in more weight gain over time)  - can try smoothies made from fruit and almond/rice milk in am instead of regular breakfast - can also try old-fashioned (not instant) oatmeal made with almond/rice milk in am - order the dressing on the side when eating salad at a restaurant (pour less than half of the dressing on the salad) - eat as little meat as possible - can try juicing, but should not forget that juicing will get rid of the fiber, so would alternate with eating raw veg./fruits or drinking smoothies - use as little oil as possible, even when using olive oil - can dress a salad with a mix of balsamic vinegar and lemon juice, for e.g. - use agave nectar, stevia sugar, or regular sugar rather than artificial sweateners - steam or broil/roast veggies  - snack on veggies/fruit/nuts (unsalted, preferably) when possible, rather than processed foods - reduce or eliminate aspartame in diet (it is in diet sodas, chewing gum, etc) Read the labels!  Try to read Dr. Janene Harvey book: "Program for Reversing Diabetes" for other ideas for healthy eating.

## 2016-06-21 NOTE — Progress Notes (Signed)
Patient ID: [name], male   DOB: 03-24-1961, 55 y.o.   MRN: [id number]   HPI: [name] is a 55 y.o.-year-old male, referred by his PCP, Dr. Para March, for management of DM2, dx in 1993, insulin-dependent, uncontrolled since ~2005, with complications (CAD, CHF, gastroparesis, ED).  Last hemoglobin A1c was: Lab Results  Component Value Date   HGBA1C 14.6 (H) 05/11/2016   HGBA1C 12.0 (H) 02/05/2016   HGBA1C 14.2 (H) 11/03/2015   Pt is on a regimen of: - Metformin 1000 mg 2x a day, with meals - Levemir 82 units at bedtime - Novolog 20-30 units 3x a day - more compliant after retirement - 06/09/2016 He also tried other oral meds >> cannot remember name.  Pt checks his sugars 2x a day and they are: - am: 120s-150s - 2h after b'fast: n/c - before lunch: n/c - 2h after lunch: n/c - before dinner: 200s - 2h after dinner: high 200s-300s, 400s before retirement - bedtime: n/c - nighttime: n/c No severe lows. Lowest sugar was 60s- at night; he has hypoglycemia awareness at 100.  Highest sugar was 400s  Glucometer: One Touch Ultra   Pt's meals are: - Breakfast (usu. 30 units): sausage, 1 egg, bacon, rarely bisquit; cereals - Lunch (usu. 20 units): soup, sandwich - when working: fast food  - Dinner (usu. 30 units): meat/fish + vegetable, including sweet potatoes + occasionally: starch - Snacks: no dessert usually, cookies, crackers; diet soda, occasionally regular sodas, occasionally milk  He has a h/o lap band sx. In 2012.  - no CKD, last BUN/creatinine:  Lab Results  Component Value Date   BUN 13 05/11/2016   BUN 17 11/03/2015   CREATININE 0.98 05/11/2016   CREATININE 1.02 11/03/2015  On Valsartan. - last set of lipids: Lab Results  Component Value Date   CHOL 172 05/18/2015   HDL 44 05/18/2015   LDLCALC 106 05/18/2015   TRIG 108 05/18/2015   CHOLHDL 3.9 05/18/2015  On Simvastatin. - last eye exam was in 2016. No DR.  - + numbness in his legs after back surgery.  Had NCT. On ASA 81.   Pt has FH of DM in M,F,sister.  He had RMSF in 2012.   ROS: Constitutional: no weight gain/loss, + fatigue, no subjective hyperthermia/hypothermia, + nocturia - 2x, + poor sleep Eyes: no blurry vision, no xerophthalmia ENT: no sore throat, no nodules palpated in throat, no dysphagia/odynophagia, no hoarseness Cardiovascular: no CP/+ SOB with exertion/palpitations/leg swelling Respiratory: no cough/+ SOB Gastrointestinal: no N/V/D/C/+ heartburn Musculoskeletal: no muscle/+ joint aches Skin: no rashes Neurological: no tremors/numbness/tingling/+ dizziness Psychiatric: no depression/anxiety + low libido  Past Medical History:  Diagnosis Date  . Allergic rhinitis, cause unspecified   . Degeneration of intervertebral disc, site unspecified   . Diabetes mellitus   . Hyperlipidemia   . Hypertension   . Impotence of organic origin   . Obesity, unspecified   . Obstructive sleep apnea (adult) (pediatric)   . Other chronic allergic conjunctivitis   . Personal history of colonic polyps    Past Surgical History:  Procedure Laterality Date  . admission  01/2009   [facility].  Syncopal event with nausea, diaphoresis, hypertension.  D-dimer elevated at 2.54 with negative CT chest. and VQ scan.  Cardiac enzymes negative x 3.  Labs normal.  Cardiology consult---cardiolite abn; cath, 2D-echo, holter monitor unrevealing.  SE H&V.  Marland Kitchen Allergy Consult  07/11/2010   SOB, itching, facial tingling, hypotension.  Allergy testing: no food allergies; +  reaction to tree pollen, grass pollen, dust mites, mold.  Avoid diclofenac and similar products.  [name].  . APPENDECTOMY    . arthroscopy of knee  1980  . BACK SURGERY     x3  . COLONOSCOPY  07/11/2009   single polyp; repeat in 5 years.  [name].  . GI consult  07/11/2010   CT chest in ED (gastric thickening).  [name].  No EGD warranted due to lack of symptoms.  Marland Kitchen. LAPAROSCOPIC GASTRIC BANDING  09/15/09  . LEFT HEART CATHETERIZATION WITH  CORONARY ANGIOGRAM N/A 04/02/2014   Procedure: LEFT HEART CATHETERIZATION WITH CORONARY ANGIOGRAM;  Surgeon: Lesleigh NoeHenry W Smith III, MD;  [facility];  Service: Cardiovascular;  Laterality: N/A;  . QUADRICEPS REPAIR     left  . QUADRICEPS TENDON REPAIR     left  . ROTATOR CUFF REPAIR     right  . Sleep study  07/11/2010   repeat sleep study due to weight loss.  Severe OSA; CPAP titration to 8 cm.    . SPINE SURGERY    . TONSILLECTOMY AND ADENOIDECTOMY  1960   Social History   Social History  . Marital status: Married    Spouse name: N/A  . Number of children: 3  . Years of education: N/A   Occupational History  . 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 Smoking status: Former Smoker, quit in 1984    Packs/day: 0.25    Years: 2.00    Types: Cigarettes  . Smokeless tobacco: Never Used  . Alcohol use 0.0 oz/week     Comment: occasional Beer once every two weeks on the weekends  . Drug use: No   Social History Narrative   Married 1986       Children: 3 children, 3 grandchildren; 2 great grandkids      Lives: with wife, dog.      Alcohol: beer twice per month on weekends.      Drugs: none      Exercise: walking three days per week.   Current Outpatient Prescriptions on File Prior to Visit  Medication Sig Dispense Refill  . albuterol (PROVENTIL HFA;VENTOLIN HFA) 108 (90 BASE) MCG/ACT inhaler Inhale 2 puffs into the lungs every 6 (six) hours as needed for wheezing or shortness of breath (cough, shortness of breath or wheezing.). 1 Inhaler 1  . aspirin EC 81 MG tablet Take 81 mg by mouth daily.    . B-D INS SYR ULTRAFINE 1CC/31G 31G X 5/16" 1 ML MISC USE FOR INSULIN INJECTIONS THREE TIMES DAILY 100 each 5  . Calcium-Magnesium-Vitamin D (CALCIUM 500 PO) Take 500 mg by mouth 3 (three) times daily.    . cyclobenzaprine (FLEXERIL) 5 MG tablet Take 1 tablet (5 mg total) by mouth at bedtime. 30  tablet 3  . famotidine (PEPCID) 20 MG tablet TAKE ONE (1) TABLET BY MOUTH EVERY NIGHTAT BEDTIME 30 tablet 0  . FLUoxetine (PROZAC) 20 MG tablet TAKE 1 TABLET BY MOUTH DAILY 30 tablet 5  . glucose blood (ONE TOUCH ULTRA TEST) test strip Test blood sugar 3 times daily. Dx code: E11.9. Insulin treated 300 each 3  . insulin aspart (NOVOLOG FLEXPEN) 100 UNIT/ML FlexPen Inject 20-30 Units into the skin 3 (three) times daily with meals. 10 pen 11  . metFORMIN (GLUCOPHAGE) 1000 MG tablet TAKE ONE TABLET BY MOUTH TWICE DAILY WITH MEALS. 60 tablet 5  . Multiple  Vitamin (MULTIVITAMIN) capsule Take 1 capsule by mouth daily.    Marland Kitchen. omeprazole (PRILOSEC) 40 MG capsule Take 40 mg by mouth daily.    . sildenafil (VIAGRA) 100 MG tablet Take 100 mg by mouth daily as needed for erectile dysfunction.     . simvastatin (ZOCOR) 40 MG tablet TAKE 1 TABLET BY MOUTH DAILY 90 tablet 3  . spironolactone (ALDACTONE) 25 MG tablet Take 0.5 tablets (12.5 mg total) by mouth daily. 45 tablet 3  . valsartan (DIOVAN) 80 MG tablet Take 0.5 tablets (40 mg total) by mouth daily.    . Insulin Detemir (LEVEMIR) 100 UNIT/ML Pen Inject 82 Units into the skin daily at 10 pm. Or as directed; may need more insulin per day as advised by provider. 15 mL 11   No current facility-administered medications on file prior to visit.    Allergies  Allergen Reactions  . Diclofenac Sodium Shortness Of Breath, Itching and Other (See Comments)    Hypotension   . Voltaren [Diclofenac Sodium] Anaphylaxis   Family History  Problem Relation Age of Onset  . Cancer Father     skin  . Heart disease Father 1862    AMI first age 57; stents.  . Diabetes Father   . Hypertension Father   . Hyperlipidemia Father   . Peripheral Artery Disease Father   . Parkinson's disease Father   . Cancer Paternal Grandfather     skin  . Diabetes Mother   . Arthritis Mother     total hip replacement  . Kidney failure Mother   . Depression Sister   . Diabetes Sister    . Cancer Maternal Uncle     lymphoma  . Prostate cancer Maternal Uncle   . Colon cancer Neg Hx    PE: BP 128/82 (BP Location: Left Arm, Patient Position: Sitting, Cuff Size: Large)   Pulse 87   Ht 6\' 1"  (1.854 m)   Wt (!) 301 lb (136.5 kg)   SpO2 98%   BMI 39.71 kg/m  Wt Readings from Last 3 Encounters:  06/21/16 (!) 301 lb (136.5 kg)  05/11/16 (!) 303 lb 8 oz (137.7 kg)  02/09/16 (!) 309 lb 8 oz (140.4 kg)   Constitutional: overweight, in NAD Eyes: PERRLA, EOMI, no exophthalmos ENT: moist mucous membranes, no thyromegaly, no cervical lymphadenopathy Cardiovascular: RRR, No MRG Respiratory: CTA B Gastrointestinal: abdomen soft, NT, ND, BS+ Musculoskeletal: no deformities, strength intact in all 4 Skin: moist, warm, no rashes Neurological: no tremor with outstretched hands, DTR normal in all 4  ASSESSMENT: 1. DM2, insulin-dependent, uncontrolled, with complications - CAD - CHF - gastroparesis - ED  PLAN:  1. Patient with long-standing, uncontrolled diabetes, on metformin + basal insulin, which became insufficient. His HbA1c remains extremely high.  - He occasionally has low blood sugars during the night or before meals and we discussed that this is probably because of a high basal insulin dose. We will reduce the Levemir dose however, we will need to improve his mealtime coverage, and I suggested to add a GLP 1 receptor agonist rather than increasing his mealtime insulin. We will start with a low-dose and increase as tolerated. - I strongly advised him about continuing to take his NovoLog consistently, and not skip any doses. He started to take the doses as prescribed starting 2 weeks ago after his retirement. - I suggested to:  Patient Instructions  Please continue: - Metformin 1000 mg 2x a day, with meals - Novolog 20-30 units 3x a day  Please decrease: - Levemir to 75 units at night  Add: - Trulicity 0.75 mg weekly. Call me in 3 weeks to let me know if I need to  send the prescription for the higher dose (1.5 mg) to your pharmacy.  Please return in 1.5 months with your sugar log.   - Strongly advised him to start checking sugars at different times of the day - check 3 times a day, rotating checks - given sugar log and advised how to fill it and to bring it at next appt  - given foot care handout and explained the principles  - given instructions for hypoglycemia management "15-15 rule"  - advised for yearly eye exams >> he needs one - He is up-to-date with flu shot - Return to clinic in 1.5 mo with sugar log   Carlus Pavlov, MD PhD [facility] Endocrinology

## 2016-07-22 ENCOUNTER — Encounter: Payer: Self-pay | Admitting: Internal Medicine

## 2016-07-25 ENCOUNTER — Other Ambulatory Visit: Payer: Self-pay

## 2016-07-25 MED ORDER — DULAGLUTIDE 1.5 MG/0.5ML ~~LOC~~ SOAJ: SUBCUTANEOUS | 3 refills | Status: DC

## 2016-08-08 ENCOUNTER — Ambulatory Visit (INDEPENDENT_AMBULATORY_CARE_PROVIDER_SITE_OTHER): Payer: BC Managed Care – PPO | Admitting: Internal Medicine

## 2016-08-08 ENCOUNTER — Encounter: Payer: Self-pay | Admitting: Internal Medicine

## 2016-08-08 ENCOUNTER — Encounter: Payer: Self-pay | Admitting: Cardiovascular Disease

## 2016-08-08 ENCOUNTER — Ambulatory Visit (INDEPENDENT_AMBULATORY_CARE_PROVIDER_SITE_OTHER): Payer: BC Managed Care – PPO | Admitting: Cardiovascular Disease

## 2016-08-08 VITALS — BP 116/72 | HR 90 | Ht 73.0 in | Wt 300.4 lb

## 2016-08-08 VITALS — BP 138/80 | HR 108 | Ht 74.0 in | Wt 303.0 lb

## 2016-08-08 DIAGNOSIS — E1165 Type 2 diabetes mellitus with hyperglycemia: Secondary | ICD-10-CM | POA: Diagnosis not present

## 2016-08-08 DIAGNOSIS — E785 Hyperlipidemia, unspecified: Secondary | ICD-10-CM | POA: Diagnosis not present

## 2016-08-08 DIAGNOSIS — E1159 Type 2 diabetes mellitus with other circulatory complications: Secondary | ICD-10-CM

## 2016-08-08 DIAGNOSIS — I5032 Chronic diastolic (congestive) heart failure: Secondary | ICD-10-CM

## 2016-08-08 DIAGNOSIS — I10 Essential (primary) hypertension: Secondary | ICD-10-CM

## 2016-08-08 DIAGNOSIS — I2510 Atherosclerotic heart disease of native coronary artery without angina pectoris: Secondary | ICD-10-CM | POA: Diagnosis not present

## 2016-08-08 MED ORDER — INSULIN DETEMIR 100 UNIT/ML FLEXPEN: 65.0000 [IU] | PEN_INJECTOR | Freq: Every day | SUBCUTANEOUS | 5 refills | Status: DC

## 2016-08-08 MED ORDER — INSULIN ASPART 100 UNIT/ML FLEXPEN: 20.0000 [IU] | PEN_INJECTOR | Freq: Three times a day (TID) | SUBCUTANEOUS | 11 refills | Status: DC

## 2016-08-08 NOTE — Patient Instructions (Addendum)
Please decrease: - Levemir: 65 units at bedtime - Novolog: 20-25 units before meals.  Please continue: - Metformin 1000 mg 2x a day, with meals - Trulicity 1.5 mg weekly   Please return in 3 months with your sugar log.

## 2016-08-08 NOTE — Progress Notes (Signed)
Patient ID: [name], male   DOB: 07/11/1961, 56 y.o.   MRN: [id number]   HPI: [name] Oreslan C Futch is a 56 y.o.-year-old male, returning for f/u for DM2, dx in 1993, insulin-dependent, uncontrolled since ~2005, with complications (CAD, CHF, gastroparesis, ED). Last visit 1.5 mo ago.  [place] checking sugars less then.  Last hemoglobin A1c was: Lab Results  Component Value Date   HGBA1C 14.6 (H) 05/11/2016   HGBA1C 12.0 (H) 02/05/2016   HGBA1C 14.2 (H) 11/03/2015   Pt is on a regimen of: - Metformin 1000 mg 2x a day, with meals - Novolog 20-30 units 3x a day - more compliant after retirement - 06/09/2016 - Levemir 82 >> 75 units at night - Trulicity 0.75 mg weekly - started 06/2016 >> 1.5 mg - started 07/2016. He also tried other oral meds >> cannot remember name.  Pt checks his sugars 2-3x a day and they are: - am: 120s-150s >> 86-145, 202 - 2h after b'fast: n/c >> 245 - before lunch: n/c  >> 75-155 - 2h after lunch: n/c - before dinner: 200s >> 78-302 (higher if missed ins. With lunch) - 2h after dinner: high 200s-300s, 400s before retirement >>  85-201 - bedtime: n/c - nighttime: n/c >> 68-82 No severe lows. Lowest sugar was 60s- at night; he has hypoglycemia awareness at 100.  Highest sugar was 400s >> 302  Glucometer: One Touch Ultra   Pt's meals are: - Breakfast (usu. 30 units): sausage, 1 egg, bacon, rarely bisquit; cereals - Lunch (usu. 20 units): soup, sandwich - when working: fast food  - Dinner (usu. 30 units): meat/fish + vegetable, including sweet potatoes + occasionally: starch - Snacks: no dessert usually, cookies, crackers; diet soda, occasionally regular sodas, occasionally milk  He has a h/o lap band sx. In 2012.  - no CKD, last BUN/creatinine:  Lab Results  Component Value Date   BUN 13 05/11/2016   BUN 17 11/03/2015   CREATININE 0.98 05/11/2016   CREATININE 1.02 11/03/2015  On Valsartan. - last set of lipids: Lab  Results  Component Value Date   CHOL 172 05/18/2015   HDL 44 05/18/2015   LDLCALC 106 05/18/2015   TRIG 108 05/18/2015   CHOLHDL 3.9 05/18/2015  On Simvastatin. - last eye exam was in 2016. No DR.  - + numbness in his legs after back surgery. Had NCT. On ASA 81.   He had RMSF in 2012.   ROS: Constitutional: no weight gain/loss, no fatigue, no subjective hyperthermia/hypothermia, + nocturia Eyes: no blurry vision, no xerophthalmia ENT: no sore throat, no nodules palpated in throat, no dysphagia/odynophagia, no hoarseness Cardiovascular: no CP/SOB/palpitations/leg swelling Respiratory: no cough/ SOB Gastrointestinal: no N/V/D/C/+ heartburn Musculoskeletal: no muscle/+ joint aches Skin: no rashes Neurological: no tremors/numbness/tingling/dizziness  I reviewed pt's medications, allergies, PMH, social hx, family hx, and changes were documented in the history of present illness. Otherwise, unchanged from my initial visit note.  Past Medical History:  Diagnosis Date  . Allergic rhinitis, cause unspecified   . Degeneration of intervertebral disc, site unspecified   . Diabetes mellitus   . Hyperlipidemia   . Hypertension   . Impotence of organic origin   . Obesity, unspecified   . Obstructive sleep apnea (adult) (pediatric)   . Other chronic allergic conjunctivitis   . Personal history of colonic polyps    Past Surgical History:  Procedure Laterality Date  . admission  01/2009   [facility].  Syncopal event  with nausea, diaphoresis, hypertension.  D-dimer elevated at 2.54 with negative CT chest. and VQ scan.  Cardiac enzymes negative x 3.  Labs normal.  Cardiology consult---cardiolite abn; cath, 2D-echo, holter monitor unrevealing.  SE H&V.  Marland Kitchen Allergy Consult  07/11/2010   SOB, itching, facial tingling, hypotension.  Allergy testing: no food allergies; +reaction to tree pollen, grass pollen, dust mites, mold.  Avoid diclofenac and similar products.  [name].  . APPENDECTOMY    .  arthroscopy of knee  1980  . BACK SURGERY     x3  . COLONOSCOPY  07/11/2009   single polyp; repeat in 5 years.  [name].  . GI consult  07/11/2010   CT chest in ED (gastric thickening).  [name].  No EGD warranted due to lack of symptoms.  Marland Kitchen LAPAROSCOPIC GASTRIC BANDING  09/15/09  . LEFT HEART CATHETERIZATION WITH CORONARY ANGIOGRAM N/A 04/02/2014   Procedure: LEFT HEART CATHETERIZATION WITH CORONARY ANGIOGRAM;  Surgeon: Lesleigh Noe, MD;  Location: [facility] CATH LAB;  Service: Cardiovascular;  Laterality: N/A;  . QUADRICEPS REPAIR     left  . QUADRICEPS TENDON REPAIR     left  . ROTATOR CUFF REPAIR     right  . Sleep study  07/11/2010   repeat sleep study due to weight loss.  Severe OSA; CPAP titration to 8 cm.    . SPINE SURGERY    . TONSILLECTOMY AND ADENOIDECTOMY  1960   Social History   Social History  . Marital status: Married    Spouse name: N/A  . Number of children: 3  . Years of education: N/A   Occupational History  . 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 Smoking status: Former Smoker, quit in 1984    Packs/day: 0.25    Years: 2.00    Types: Cigarettes  . Smokeless tobacco: Never Used  . Alcohol use 0.0 oz/week     Comment: occasional Beer once every two weeks on the weekends  . Drug use: No   Social History Narrative   Married 1986       Children: 3 children, 3 grandchildren; 2 great grandkids      Lives: with wife, dog.      Alcohol: beer twice per month on weekends.      Drugs: none      Exercise: walking three days per week.   Current Outpatient Prescriptions on File Prior to Visit  Medication Sig Dispense Refill  . albuterol (PROVENTIL HFA;VENTOLIN HFA) 108 (90 BASE) MCG/ACT inhaler Inhale 2 puffs into the lungs every 6 (six) hours as needed for wheezing or shortness of breath (cough, shortness of breath or wheezing.). 1 Inhaler 1  . aspirin EC 81 MG tablet Take 81 mg by mouth  daily.    . B-D INS SYR ULTRAFINE 1CC/31G 31G X 5/16" 1 ML MISC USE FOR INSULIN INJECTIONS THREE TIMES DAILY 100 each 5  . Calcium-Magnesium-Vitamin D (CALCIUM 500 PO) Take 500 mg by mouth 3 (three) times daily.    . cyclobenzaprine (FLEXERIL) 5 MG tablet Take 1 tablet (5 mg total) by mouth at bedtime. 30 tablet 3  . Dulaglutide (TRULICITY) 1.5 MG/0.5ML SOPN Inject weekly 4 pen 3  . famotidine (PEPCID) 20 MG tablet TAKE ONE (1) TABLET BY MOUTH EVERY NIGHTAT BEDTIME 30 tablet 0  . FLUoxetine (PROZAC) 20 MG tablet TAKE 1 TABLET BY MOUTH DAILY 30 tablet 5  .  glucose blood (ONE TOUCH ULTRA TEST) test strip Test blood sugar 3 times daily. Dx code: E11.9. Insulin treated 300 each 3  . insulin aspart (NOVOLOG FLEXPEN) 100 UNIT/ML FlexPen Inject 20-30 Units into the skin 3 (three) times daily with meals. 10 pen 11  . Insulin Detemir (LEVEMIR) 100 UNIT/ML Pen Inject 75 Units into the skin daily at 10 pm. 15 mL 5  . metFORMIN (GLUCOPHAGE) 1000 MG tablet TAKE ONE TABLET BY MOUTH TWICE DAILY WITH MEALS. 60 tablet 5  . Multiple Vitamin (MULTIVITAMIN) capsule Take 1 capsule by mouth daily.    Marland Kitchen omeprazole (PRILOSEC) 40 MG capsule Take 40 mg by mouth daily.    . sildenafil (VIAGRA) 100 MG tablet Take 100 mg by mouth daily as needed for erectile dysfunction.     . simvastatin (ZOCOR) 40 MG tablet TAKE 1 TABLET BY MOUTH DAILY 90 tablet 3  . spironolactone (ALDACTONE) 25 MG tablet Take 0.5 tablets (12.5 mg total) by mouth daily. 45 tablet 3  . valsartan (DIOVAN) 80 MG tablet Take 0.5 tablets (40 mg total) by mouth daily.     No current facility-administered medications on file prior to visit.    Allergies  Allergen Reactions  . Diclofenac Sodium Shortness Of Breath, Itching and Other (See Comments)    Hypotension   . Voltaren [Diclofenac Sodium] Anaphylaxis   Family History  Problem Relation Age of Onset  . Cancer Father     skin  . Heart disease Father 21    AMI first age 58; stents.  . Diabetes  Father   . Hypertension Father   . Hyperlipidemia Father   . Peripheral Artery Disease Father   . Parkinson's disease Father   . Cancer Paternal Grandfather     skin  . Diabetes Mother   . Arthritis Mother     total hip replacement  . Kidney failure Mother   . Depression Sister   . Diabetes Sister   . Cancer Maternal Uncle     lymphoma  . Prostate cancer Maternal Uncle   . Colon cancer Neg Hx    PE: BP 138/80 (BP Location: Left Arm, Patient Position: Sitting)   Pulse (!) 108   Ht 6\' 2"  (1.88 m)   Wt (!) 303 lb (137.4 kg)   SpO2 98%   BMI 38.90 kg/m  Wt Readings from Last 3 Encounters:  08/08/16 (!) 303 lb (137.4 kg)  08/08/16 (!) 300 lb 6.4 oz (136.3 kg)  06/21/16 (!) 301 lb (136.5 kg)   Constitutional: overweight, in NAD Eyes: PERRLA, EOMI, no exophthalmos ENT: moist mucous membranes, no thyromegaly, no cervical lymphadenopathy Cardiovascular: RRR, No MRG Respiratory: CTA B Gastrointestinal: abdomen soft, NT, ND, BS+ Musculoskeletal: no deformities, strength intact in all 4 Skin: moist, warm, no rashes Neurological: no tremor with outstretched hands, DTR normal in all 4  ASSESSMENT: 1. DM2, insulin-dependent, uncontrolled, with complications - CAD - CHF - gastroparesis - ED  PLAN:  1. Patient with long-standing, uncontrolled diabetes, on metformin + basal insulin + now Trulicity, with much improved CBGs since last visit! We reduced his Levemir at last visit and We need to decrease it even more, since he continues to occasionally have low blood sugars at night and before meals. We'll also decrease his mealtime NovoLog as he just started the higher dose of Trulicity. He has no side effects from this.  - HbA1c 14.6% at last visit >> 12.1% (better - his next one should be much better) - I suggested to:  Patient Instructions  Please decrease: - Levemir: 65 units at bedtime - Novolog: 20-25 units before meals.  Please continue: - Metformin 1000 mg 2x a day, with  meals - Trulicity 1.5 mg weekly   Please return in 3 months with your sugar log.  - check sugars at different times of the day - check 3 times a day, rotating checks - advised for yearly eye exams >> he needs one he will schedule-  - He is up-to-date with flu shot - Return to clinic in 3 mo with sugar log   Carlus Pavlov, MD PhD [facility] Endocrinology

## 2016-08-08 NOTE — Patient Instructions (Signed)

## 2016-08-08 NOTE — Progress Notes (Signed)
Chief Complaint  Patient presents with  . Follow-up    1 year     History of Present Illness: 56 y.o. male with a history of HTN, HLD, morbid obesity status post lap band surgery, diabetes mellitus, sleep apnea and CAD who is here today for follow up. I met him in June 2016. He was seen in the emergency room 03/01/14 with chest pain and dyspnea by Dr. Rayann Heman. Cardiac markers were normal. Stress myoview demonstrated apical defect (possible scar). Cardiac cath 2015 demonstrated mild plaque but no obstructive disease. LVEDP was elevated and continued management of diastolic CHF was recommended. Echo 03/26/14 with normal LV systolic function, grade 1 diastolic dysfunction.   He is here today for follow up. He denies any exertional chest pain. He has occasional dyspnea. No palpitations, near syncope or syncope.   He is a [employer].   Primary Care Physician: Elsie Stain, MD   Past Medical History:  Diagnosis Date  . Allergic rhinitis, cause unspecified   . Degeneration of intervertebral disc, site unspecified   . Diabetes mellitus   . Hyperlipidemia   . Hypertension   . Impotence of organic origin   . Obesity, unspecified   . Obstructive sleep apnea (adult) (pediatric)   . Other chronic allergic conjunctivitis   . Personal history of colonic polyps     Past Surgical History:  Procedure Laterality Date  . admission  01/2009   [facility]  Syncopal event with nausea, diaphoresis, hypertension.  D-dimer elevated at 2.54 with negative CT chest. and VQ scan.  Cardiac enzymes negative x 3.  Labs normal.  Cardiology consult---cardiolite abn; cath, 2D-echo, holter monitor unrevealing.  SE H&V.  Marland Kitchen Allergy Consult  07/11/2010   SOB, itching, facial tingling, hypotension.  Allergy testing: no food allergies; +reaction to tree pollen, grass pollen, dust mites, mold.  Avoid diclofenac and similar products.  [name].  . APPENDECTOMY    . arthroscopy of knee  1980  . BACK SURGERY     x3  . COLONOSCOPY  07/11/2009   single polyp; repeat in 5 years.  [name].  . GI consult  07/11/2010   CT chest in ED (gastric thickening).  [name].  No EGD warranted due to lack of symptoms.  Marland Kitchen LAPAROSCOPIC GASTRIC BANDING  09/15/09  . LEFT HEART CATHETERIZATION WITH CORONARY ANGIOGRAM N/A 04/02/2014   Procedure: LEFT HEART CATHETERIZATION WITH CORONARY ANGIOGRAM;  Surgeon: [name], MD;  Location: [facility] CATH LAB;  Service: Cardiovascular;  Laterality: N/A;  . QUADRICEPS REPAIR     left  . QUADRICEPS TENDON REPAIR     left  . ROTATOR CUFF REPAIR     right  . Sleep study  07/11/2010   repeat sleep study due to weight loss.  Severe OSA; CPAP titration to 8 cm.    . SPINE SURGERY    . TONSILLECTOMY AND ADENOIDECTOMY  1960    Current Outpatient Prescriptions  Medication Sig Dispense Refill  . albuterol (PROVENTIL HFA;VENTOLIN HFA) 108 (90 BASE) MCG/ACT inhaler Inhale 2 puffs into the lungs every 6 (six) hours as needed for wheezing or shortness of breath (cough, shortness of breath or wheezing.). 1 Inhaler 1  . aspirin EC 81 MG tablet Take 81 mg by mouth daily.    . B-D INS SYR ULTRAFINE 1CC/31G 31G X 5/16" 1 ML MISC USE FOR INSULIN INJECTIONS THREE TIMES DAILY 100 each 5  . Calcium-Magnesium-Vitamin D (CALCIUM 500 PO) Take 500 mg by mouth 3 (three) times  daily.    . cyclobenzaprine (FLEXERIL) 5 MG tablet Take 1 tablet (5 mg total) by mouth at bedtime. 30 tablet 3  . Dulaglutide (TRULICITY) 1.5 GP/4.9IY SOPN Inject weekly 4 pen 3  . famotidine (PEPCID) 20 MG tablet TAKE ONE (1) TABLET BY MOUTH EVERY NIGHTAT BEDTIME 30 tablet 0  . FLUoxetine (PROZAC) 20 MG tablet TAKE 1 TABLET BY MOUTH DAILY 30 tablet 5  . glucose blood (ONE TOUCH ULTRA TEST) test strip Test blood sugar 3 times daily. Dx code: E11.9. Insulin treated 300 each 3  . insulin aspart (NOVOLOG FLEXPEN) 100 UNIT/ML FlexPen Inject 20-30 Units into the skin 3 (three) times daily with meals. 10 pen 11  . Insulin Detemir (LEVEMIR) 100  UNIT/ML Pen Inject 75 Units into the skin daily at 10 pm. 15 mL 5  . metFORMIN (GLUCOPHAGE) 1000 MG tablet TAKE ONE TABLET BY MOUTH TWICE DAILY WITH MEALS. 60 tablet 5  . Multiple Vitamin (MULTIVITAMIN) capsule Take 1 capsule by mouth daily.    Marland Kitchen omeprazole (PRILOSEC) 40 MG capsule Take 40 mg by mouth daily.    . sildenafil (VIAGRA) 100 MG tablet Take 100 mg by mouth daily as needed for erectile dysfunction.     . simvastatin (ZOCOR) 40 MG tablet TAKE 1 TABLET BY MOUTH DAILY 90 tablet 3  . spironolactone (ALDACTONE) 25 MG tablet Take 0.5 tablets (12.5 mg total) by mouth daily. 45 tablet 3  . valsartan (DIOVAN) 80 MG tablet Take 0.5 tablets (40 mg total) by mouth daily.     No current facility-administered medications for this visit.     Allergies  Allergen Reactions  . Diclofenac Sodium Shortness Of Breath, Itching and Other (See Comments)    Hypotension   . Voltaren [Diclofenac Sodium] Anaphylaxis    Social History   Social History  . Marital status: Married    Spouse name: N/A  . Number of children: 3  . Years of education: N/A   Occupational History  . 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 Smoking status: Former Smoker    Packs/day: 0.25    Years: 2.00    Types: Cigarettes  . Smokeless tobacco: Never Used  . Alcohol use 0.0 oz/week     Comment: occasional Beer once every two weeks on the weekends  . Drug use: No  . Sexual activity: Yes   Other Topics Concern  . Not on file   Social History Narrative   Married 1986       Children: 3 children, 3 grandchildren; 2 great grandkids      Lives: with wife, dog.      Employment: employed.      Tobacco: never      Alcohol: beer twice per month on weekends.      Drugs: none      Exercise: walking three days per week.      Seatbelt: 100% of time      Guns: loaded and secured/locked.      Sexual activity: sexually active with wife.       Sunscreen: yes.    Family History  Problem Relation Age of Onset  . Cancer Father     skin  . Heart disease Father 12    AMI first age 73; stents.  . Diabetes Father   . Hypertension Father   . Hyperlipidemia Father   . Peripheral Artery Disease Father   . Parkinson's  disease Father   . Cancer Paternal Grandfather     skin  . Diabetes Mother   . Arthritis Mother     total hip replacement  . Kidney failure Mother   . Depression Sister   . Diabetes Sister   . Cancer Maternal Uncle     lymphoma  . Prostate cancer Maternal Uncle   . Colon cancer Neg Hx     Review of Systems:  As stated in the HPI and otherwise negative.   BP 116/72 (BP Location: Right Arm)   Pulse 90   Ht 6' 1"  (1.854 m)   Wt (!) 300 lb 6.4 oz (136.3 kg)   BMI 39.63 kg/m   Physical Examination: General: Well developed, well nourished, NAD  HEENT: OP clear, mucus membranes moist  SKIN: warm, dry. No rashes. Neuro: No focal deficits  Musculoskeletal: Muscle strength 5/5 all ext  Psychiatric: Mood and affect normal  Neck: No JVD, no carotid bruits, no thyromegaly, no lymphadenopathy.  Lungs:Clear bilaterally, no wheezes, rhonci, crackles Cardiovascular: Regular rate and rhythm. No murmurs, gallops or rubs. Abdomen:Soft. Bowel sounds present. Non-tender.  Extremities: No lower extremity edema. Pulses are 2 + in the bilateral DP/PT.   Cardiac cath 04/02/14: HEMODYNAMICS: Aortic pressure 130/69 mmHg; LV pressure 146/14 mmHg; LVEDP 22 mm mercury ANGIOGRAPHIC DATA: The left main coronary artery is widely patent/normal. The left anterior descending artery is proximally calcified free of any significant obstruction. The LAD wraps around the left ventricular apex. A large first diagonal arises and has ostial to proximal 30% narrowing. . The left circumflex artery is widely patent and normal. Gives origin to 3 obtuse marginal branches with the first and second being the larger of the 3. The right coronary  artery is widely patent with distal 30% narrowing.Marland Kitchen LEFT VENTRICULOGRAM: Left ventricular angiogram was done in the 30 RAO projection and revealed normal cavity size and contractility with an EF of 60%  Echo 03/26/14: Procedure narrative: Transthoracic echocardiography for left ventricular function evaluation, for right ventricular function evaluation, for assessment of valvular function, and for preoperative diagnosis. Image quality was suboptimal. The study was technically difficult, as a result of poor acoustic windows and poor sound wave transmission. Intravenous contrast (Definity) was administered to enhance regional wall motion assessment and opacify the LV. - Left ventricle: The cavity size was normal. There was moderate concentric hypertrophy. Systolic function was normal. The estimated ejection fraction was in the range of 60% to 65%. Images were inadequate for LV wall motion assessment. Doppler parameters are consistent with abnormal left ventricular relaxation (grade 1 diastolic dysfunction). The E/e&' ratio is between 8-15, suggesting indeterminate LV filling pressure. - Left atrium: The atrium was normal in size.  EKG:  EKG is  ordered today. The ekg ordered today demonstrates  NSR, rate 90 bpm  Recent Labs: 11/03/2015: Hemoglobin 13.6; Platelets 300.0; TSH 3.06 05/11/2016: BUN 13; Creatinine, Ser 0.98; Potassium 4.9; Sodium 130   Lipid Panel    Component Value Date/Time   CHOL 172 05/18/2015 1316   TRIG 108 05/18/2015 1316   TRIG 88 07/24/2006 1009   HDL 44 05/18/2015 1316   CHOLHDL 3.9 05/18/2015 1316   VLDL 22 05/18/2015 1316   LDLCALC 106 05/18/2015 1316     Wt Readings from Last 3 Encounters:  08/08/16 (!) 300 lb 6.4 oz (136.3 kg)  06/21/16 (!) 301 lb (136.5 kg)  05/11/16 (!) 303 lb 8 oz (137.7 kg)     Other studies Reviewed: Additional studies/ records that were reviewed  today include: . Review of the above records  demonstrates:    Assessment and Plan:   1. Chronic diastolic IOX:BDZHGD is stable. No LE edema. Continue aldactone.   2. Coronary artery disease without angina:No chest pain suggestive of angina. Mild plaque by cardiac catheterization 2015. Continue aspirin, statin.   3. Essential hypertension:BP well controlled at home. Continue current therapy.    4. Hyperlipidemia: Managed by primary care. Continue statin.  Current medicines are reviewed at length with the patient today.  The patient does not have concerns regarding medicines.  The following changes have been made:  no change  Labs/ tests ordered today include:   Orders Placed This Encounter  Procedures  . EKG 12-Lead    Disposition:   F/U with me in 12 months  Signed, Lauree Chandler, MD 08/08/2016 11:36 AM    [facility] [address], [place], [place]  [address] Phone: [phone]; Fax: [phone]

## 2016-08-09 LAB — POCT GLYCOSYLATED HEMOGLOBIN (HGB A1C): Hemoglobin A1C: 12.1

## 2016-08-09 NOTE — Addendum Note (Signed)
Addended by: Darene LamerHOMPSON, Crissa Sowder T on: 08/09/2016 08:59 AM   Modules accepted: Orders

## 2016-08-11 ENCOUNTER — Ambulatory Visit (INDEPENDENT_AMBULATORY_CARE_PROVIDER_SITE_OTHER): Payer: BC Managed Care – PPO | Admitting: Family Medicine

## 2016-08-11 ENCOUNTER — Encounter: Payer: Self-pay | Admitting: Family Medicine

## 2016-08-11 VITALS — BP 124/82 | HR 82 | Temp 97.7°F | Wt 299.5 lb

## 2016-08-11 DIAGNOSIS — E785 Hyperlipidemia, unspecified: Secondary | ICD-10-CM

## 2016-08-11 LAB — LIPID PANEL
CHOLESTEROL: 159 mg/dL (ref 0–200)
HDL: 44.8 mg/dL (ref >39.00)
LDL Cholesterol: 99 mg/dL (ref 0–99)
NonHDL: 114.04
TRIGLYCERIDES: 73 mg/dL (ref 0.0–149.0)
Total CHOL/HDL Ratio: 4
VLDL: 14.6 mg/dL (ref 0.0–40.0)

## 2016-08-11 NOTE — Patient Instructions (Addendum)
Go to the lab on the way out.  We'll contact you with your lab report. Don't change your meds for now.  Keep working on your weight.   Take care.  Glad to see you.

## 2016-08-11 NOTE — Progress Notes (Signed)
Pre visit review using our clinic review tool, if applicable. No additional management support is needed unless otherwise documented below in the visit note. 

## 2016-08-11 NOTE — Progress Notes (Signed)
DM2 with endocrine- last OV plan reviewed with patient:   Patient with long-standing, uncontrolled diabetes, on metformin + basal insulin + now Trulicity, with much improved CBGs since last visit! We reduced his Levemir at last visit and We need to decrease it even more, since he continues to occasionally have low blood sugars at night and before meals. We'll also decrease his mealtime NovoLog as he just started the higher dose of Trulicity. He has no side effects from this.  - HbA1c 14.6% at last visit >> 12.1% (better - his next one should be much better) - I suggested to:  Patient Instructions  Please decrease: - Levemir: 65 units at bedtime - Novolog: 20-25 units before meals.  Please continue: - Metformin 1000 mg 2x a day, with meals - Trulicity 1.5 mg weekly  ------------------------------------------------------------------- Recently with sugar 121 in the AM, ~100 at lunch.  That is clearly improved from prev.  D/w pt.  He feels better in the meantime. Due for eye exam.  D/w pt.    Had seen cards clinic recently.  Note reviewed.  No CP.  Still with some mild SOB, but that is improved from prev.  He has been walking more in the meantime.    Elevated Cholesterol: Using medications without problems:yes Muscle aches: no Diet compliance: yes Exercise:yes Due for labs.   Meds, vitals, and allergies reviewed.   ROS: Per HPI unless specifically indicated in ROS section  GEN: nad, alert and oriented HEENT: mucous membranes moist NECK: supple w/o LA CV: rrr. PULM: ctab, no inc wob ABD: soft, +bs EXT: no edema SKIN: no acute rash

## 2016-08-11 NOTE — Assessment & Plan Note (Addendum)
Return for labs. Continue statin. Continue work on weight loss with diet and exercise.  Appreciate endocrine input re:DM2. No chest pain. Okay for outpatient follow-up. He agrees.

## 2016-08-17 ENCOUNTER — Other Ambulatory Visit: Payer: Self-pay | Admitting: Family Medicine

## 2016-08-17 DIAGNOSIS — E785 Hyperlipidemia, unspecified: Secondary | ICD-10-CM

## 2016-08-17 MED ORDER — ATORVASTATIN CALCIUM 40 MG PO TABS: 40.0000 mg | ORAL_TABLET | Freq: Every day | ORAL | 3 refills | Status: DC

## 2016-08-24 ENCOUNTER — Other Ambulatory Visit: Payer: Self-pay | Admitting: Internal Medicine

## 2016-10-11 ENCOUNTER — Ambulatory Visit (INDEPENDENT_AMBULATORY_CARE_PROVIDER_SITE_OTHER): Payer: BC Managed Care – PPO | Admitting: Family Medicine

## 2016-10-11 ENCOUNTER — Encounter: Payer: Self-pay | Admitting: Family Medicine

## 2016-10-11 VITALS — BP 130/70 | HR 90 | Temp 97.6°F | Wt 289.8 lb

## 2016-10-11 DIAGNOSIS — E1159 Type 2 diabetes mellitus with other circulatory complications: Secondary | ICD-10-CM

## 2016-10-11 DIAGNOSIS — E785 Hyperlipidemia, unspecified: Secondary | ICD-10-CM

## 2016-10-11 DIAGNOSIS — R0609 Other forms of dyspnea: Secondary | ICD-10-CM

## 2016-10-11 DIAGNOSIS — E119 Type 2 diabetes mellitus without complications: Secondary | ICD-10-CM | POA: Diagnosis not present

## 2016-10-11 DIAGNOSIS — R0600 Dyspnea, unspecified: Secondary | ICD-10-CM

## 2016-10-11 DIAGNOSIS — I10 Essential (primary) hypertension: Secondary | ICD-10-CM | POA: Diagnosis not present

## 2016-10-11 DIAGNOSIS — E1165 Type 2 diabetes mellitus with hyperglycemia: Secondary | ICD-10-CM | POA: Diagnosis not present

## 2016-10-11 LAB — LIPID PANEL
Cholesterol: 146 mg/dL (ref 0–200)
HDL: 40.8 mg/dL (ref >39.00)
LDL Cholesterol: 87 mg/dL (ref 0–99)
NonHDL: 105.5
TRIGLYCERIDES: 91 mg/dL (ref 0.0–149.0)
Total CHOL/HDL Ratio: 4
VLDL: 18.2 mg/dL (ref 0.0–40.0)

## 2016-10-11 LAB — COMPREHENSIVE METABOLIC PANEL
ALT: 13 U/L (ref 0–53)
AST: 13 U/L (ref 0–37)
Albumin: 4.1 g/dL (ref 3.5–5.2)
Alkaline Phosphatase: 77 U/L (ref 39–117)
BILIRUBIN TOTAL: 0.6 mg/dL (ref 0.2–1.2)
BUN: 19 mg/dL (ref 6–23)
CALCIUM: 9.4 mg/dL (ref 8.4–10.5)
CO2: 30 mEq/L (ref 19–32)
Chloride: 102 mEq/L (ref 96–112)
Creatinine, Ser: 0.92 mg/dL (ref 0.40–1.50)
GFR: 89.97 mL/min (ref >60.00)
Glucose, Bld: 122 mg/dL — ABNORMAL HIGH (ref 70–99)
Potassium: 3.7 mEq/L (ref 3.5–5.1)
SODIUM: 139 meq/L (ref 135–145)
TOTAL PROTEIN: 7.5 g/dL (ref 6.0–8.3)

## 2016-10-11 LAB — HEMOGLOBIN A1C: HEMOGLOBIN A1C: 12.7 % — AB (ref 4.6–6.5)

## 2016-10-11 MED ORDER — ALBUTEROL SULFATE HFA 108 (90 BASE) MCG/ACT IN AERS: 1.0000 | INHALATION_SPRAY | Freq: Four times a day (QID) | RESPIRATORY_TRACT | 1 refills | Status: DC | PRN

## 2016-10-11 NOTE — Progress Notes (Signed)
Rare use of SABA but needed refill on med.  Used max 1-2 times per week with relief.  This isn't needed consistently.   No ADE on med.    Hypertension:    Using medication without problems or lightheadedness: mild intermittently lightheaded but clearly improved from prev.  Chest pain with exertion: no Edema:no Short of breath: see above, not usually  Elevated Cholesterol: Using medications without problems:yes Muscle aches: likely not from statin, likely from hard work- he reroofed his house yesterday.   Diet compliance: encouraged Exercise:encouraged  Labs pending.    Sugar has been improved, down to 117 this AM.  He feels better in general.  Due fur f/u labs.  He has f/u with endo pending.  He needs an eye exam.  D/w pt.  He doesn't need a referral.    PMH and SH reviewed  ROS: Per HPI unless specifically indicated in ROS section   Meds, vitals, and allergies reviewed.   GEN: nad, alert and oriented HEENT: mucous membranes moist NECK: supple w/o LA CV: rrr PULM: ctab, no inc wob ABD: soft, +bs EXT: no edema

## 2016-10-11 NOTE — Patient Instructions (Addendum)
Go to the lab on the way out.  We'll contact you with your lab report. Call about an eye exam.   Take care.  Glad to see you.  Don't change your meds for now.

## 2016-10-11 NOTE — Progress Notes (Signed)
Pre visit review using our clinic review tool, if applicable. No additional management support is needed unless otherwise documented below in the visit note. 

## 2016-10-12 ENCOUNTER — Encounter: Payer: Self-pay | Admitting: Family Medicine

## 2016-10-12 NOTE — Assessment & Plan Note (Signed)
Recheck labs today. Will route labs to endo clinic as he has follow-up pending. He reports his sugar is much improved recently. I do not know how much his A1c will like his progress. See notes on labs.

## 2016-10-12 NOTE — Assessment & Plan Note (Signed)
Controlled. No change in medications  

## 2016-10-12 NOTE — Assessment & Plan Note (Signed)
Is normally without symptoms. He occasionally needs albuterol, with relief. Discussed with patient about diet and exercise and weight loss. Clear to auscultation bilaterally.

## 2016-10-12 NOTE — Assessment & Plan Note (Signed)
Not previously goal. As been started on Lipitor 40 mg. See notes on labs. No adverse effect on medication.

## 2016-10-19 ENCOUNTER — Other Ambulatory Visit: Payer: BC Managed Care – PPO

## 2016-10-20 ENCOUNTER — Other Ambulatory Visit: Payer: Self-pay | Admitting: Family Medicine

## 2016-10-20 NOTE — Telephone Encounter (Signed)
Would continue.  Sent.  Thanks.  

## 2016-10-20 NOTE — Telephone Encounter (Signed)
Last filled 02/28/06 with 5 refills--- did not see note in reference to anxiety/depression in last 2 OV notes---please advise if okay for pt to continue

## 2016-11-11 ENCOUNTER — Ambulatory Visit: Payer: BC Managed Care – PPO | Admitting: Internal Medicine

## 2016-11-16 ENCOUNTER — Other Ambulatory Visit: Payer: Self-pay | Admitting: Internal Medicine

## 2016-11-16 MED ORDER — DULAGLUTIDE 1.5 MG/0.5ML ~~LOC~~ SOAJ: SUBCUTANEOUS | 2 refills | Status: DC

## 2016-11-23 ENCOUNTER — Other Ambulatory Visit: Payer: Self-pay | Admitting: Family Medicine

## 2016-11-23 DIAGNOSIS — I5032 Chronic diastolic (congestive) heart failure: Secondary | ICD-10-CM

## 2016-11-23 NOTE — Telephone Encounter (Signed)
Sent. Thanks.   

## 2016-11-23 NOTE — Telephone Encounter (Signed)
Last office visit 10/11/2016.  Last refilled 12/14/2015 for #30 with 3 refills.  Ok to refill?

## 2016-11-29 ENCOUNTER — Other Ambulatory Visit: Payer: Self-pay | Admitting: Family Medicine

## 2016-12-01 NOTE — Telephone Encounter (Signed)
Routed to endo for input, with appreciation .

## 2016-12-03 ENCOUNTER — Encounter: Payer: Self-pay | Admitting: Family Medicine

## 2016-12-03 MED ORDER — INSULIN DETEMIR 100 UNIT/ML FLEXPEN: 65.0000 [IU] | PEN_INJECTOR | Freq: Every day | SUBCUTANEOUS | 5 refills | Status: DC

## 2016-12-06 ENCOUNTER — Other Ambulatory Visit: Payer: Self-pay | Admitting: *Deleted

## 2017-01-17 ENCOUNTER — Ambulatory Visit: Payer: BC Managed Care – PPO | Admitting: Internal Medicine

## 2017-02-20 ENCOUNTER — Other Ambulatory Visit: Payer: Self-pay | Admitting: Cardiovascular Disease

## 2017-02-20 ENCOUNTER — Other Ambulatory Visit: Payer: Self-pay | Admitting: Family Medicine

## 2017-02-20 NOTE — Telephone Encounter (Signed)
Received refill request electronically for Cyclobenzaprine Last refill 11/23/16 #30 Last office visit 10/11/16

## 2017-02-21 MED ORDER — LOSARTAN POTASSIUM 50 MG PO TABS: 25.0000 mg | ORAL_TABLET | Freq: Every day | ORAL | 3 refills | Status: DC

## 2017-02-21 NOTE — Telephone Encounter (Signed)
Sent. Thanks.   

## 2017-02-21 NOTE — Telephone Encounter (Signed)
Left detailed message on voicemail.  

## 2017-02-21 NOTE — Telephone Encounter (Signed)
Patient advised.

## 2017-02-21 NOTE — Telephone Encounter (Signed)
Change valsartan to losartan.  Start with half tablet per day. If blood pressure is above 140/90 after one week then increase to 1 tablet per day. Thanks. Notify patient.

## 2017-03-07 ENCOUNTER — Ambulatory Visit (INDEPENDENT_AMBULATORY_CARE_PROVIDER_SITE_OTHER): Payer: BC Managed Care – PPO | Admitting: Family Medicine

## 2017-03-07 ENCOUNTER — Encounter: Payer: Self-pay | Admitting: Family Medicine

## 2017-03-07 VITALS — BP 130/72 | HR 100 | Temp 97.6°F | Wt 264.2 lb

## 2017-03-07 DIAGNOSIS — R0602 Shortness of breath: Secondary | ICD-10-CM | POA: Diagnosis not present

## 2017-03-07 DIAGNOSIS — R0609 Other forms of dyspnea: Secondary | ICD-10-CM

## 2017-03-07 DIAGNOSIS — R0600 Dyspnea, unspecified: Secondary | ICD-10-CM

## 2017-03-07 DIAGNOSIS — E1165 Type 2 diabetes mellitus with hyperglycemia: Secondary | ICD-10-CM | POA: Diagnosis not present

## 2017-03-07 DIAGNOSIS — E1159 Type 2 diabetes mellitus with other circulatory complications: Secondary | ICD-10-CM | POA: Diagnosis not present

## 2017-03-07 LAB — CBC WITH DIFFERENTIAL/PLATELET
BASOS PCT: 3.6 % — AB (ref 0.0–3.0)
Basophils Absolute: 0.2 10*3/uL — ABNORMAL HIGH (ref 0.0–0.1)
EOS PCT: 4.8 % (ref 0.0–5.0)
Eosinophils Absolute: 0.3 10*3/uL (ref 0.0–0.7)
HCT: 41.1 % (ref 39.0–52.0)
HEMOGLOBIN: 13.8 g/dL (ref 13.0–17.0)
LYMPHS ABS: 2.8 10*3/uL (ref 0.7–4.0)
Lymphocytes Relative: 50.2 % — ABNORMAL HIGH (ref 12.0–46.0)
MCHC: 33.6 g/dL (ref 30.0–36.0)
MCV: 89.9 fl (ref 78.0–100.0)
MONO ABS: 0.3 10*3/uL (ref 0.1–1.0)
MONOS PCT: 5.8 % (ref 3.0–12.0)
Neutro Abs: 2 10*3/uL (ref 1.4–7.7)
Neutrophils Relative %: 35.6 % — ABNORMAL LOW (ref 43.0–77.0)
Platelets: 348 10*3/uL (ref 150.0–400.0)
RBC: 4.58 Mil/uL (ref 4.22–5.81)
RDW: 13.2 % (ref 11.5–15.5)
WBC: 5.6 10*3/uL (ref 4.0–10.5)

## 2017-03-07 LAB — COMPREHENSIVE METABOLIC PANEL
ALBUMIN: 4.1 g/dL (ref 3.5–5.2)
ALK PHOS: 74 U/L (ref 39–117)
ALT: 16 U/L (ref 0–53)
AST: 15 U/L (ref 0–37)
BUN: 19 mg/dL (ref 6–23)
CHLORIDE: 103 meq/L (ref 96–112)
CO2: 32 mEq/L (ref 19–32)
Calcium: 10.3 mg/dL (ref 8.4–10.5)
Creatinine, Ser: 0.88 mg/dL (ref 0.40–1.50)
GFR: 94.57 mL/min (ref >60.00)
Glucose, Bld: 166 mg/dL — ABNORMAL HIGH (ref 70–99)
POTASSIUM: 5.1 meq/L (ref 3.5–5.1)
SODIUM: 140 meq/L (ref 135–145)
TOTAL PROTEIN: 7.7 g/dL (ref 6.0–8.3)
Total Bilirubin: 0.4 mg/dL (ref 0.2–1.2)

## 2017-03-07 LAB — TSH: TSH: 2.72 u[IU]/mL (ref 0.35–4.50)

## 2017-03-07 LAB — BRAIN NATRIURETIC PEPTIDE: Pro B Natriuretic peptide (BNP): 16 pg/mL (ref 0.0–100.0)

## 2017-03-07 LAB — HEMOGLOBIN A1C: Hgb A1c MFr Bld: 12.2 % — ABNORMAL HIGH (ref 4.6–6.5)

## 2017-03-07 NOTE — Assessment & Plan Note (Signed)
See notes on labs. 

## 2017-03-07 NOTE — Progress Notes (Signed)
SOB with sweats, with exertion.  Worse in the last few months. Can get SOB with lifting with his arms, pushing/pulling.  Then he'll get lightheaded, worse with leaning over and then standing too quickly.    H/o DM2, CHF.  No recent f/u with endo or here.  Last seen by cards back in 07/2016.  Occ heart fluttering noted in the last few weeks, with exertion, per patient he thought it occ felt irregular.  No CP.    Not SOB now.  occ use of SABA, not daily use.   Sugar has been variable from ~100-300s.  Weight is down.  Has been off novolog recently.  Still on levemir.    PMH and SH reviewed  ROS: Per HPI unless specifically indicated in ROS section   Meds, vitals, and allergies reviewed.   GEN: nad, alert and oriented HEENT: mucous membranes moist NECK: supple w/o LA CV: rrr PULM: ctab, no inc wob ABD: soft, +bs EXT: no edema SKIN: no acute rash

## 2017-03-07 NOTE — Patient Instructions (Addendum)
We'll contact you with your lab report. Call the cardiology clinic about follow up.  Don't change your meds for now.  Stay well hydrated, out of the heat, and rest for now.  Take care.  Glad to see you.

## 2017-03-08 NOTE — Assessment & Plan Note (Signed)
Could be multifactorial. Discussed with patient. Not short of breath at time of exam. Okay for outpatient follow-up. EKG shows normal sinus rhythm without acute changes. Needs cardiac follow-up given his other comorbid medical conditions. Does not appear to be in overt failure at this point. No chest pain. Unclear how much of his symptoms could be related to relative dehydration from diabetes, especially if he has been hyperglycemic and over diuresing due to elevated sugars. See notes on labs. He needs follow with endocrinology. Discussed with patient. >25 minutes spent in face to face time with patient, >50% spent in counselling or coordination of care.

## 2017-03-09 ENCOUNTER — Ambulatory Visit (INDEPENDENT_AMBULATORY_CARE_PROVIDER_SITE_OTHER): Payer: BC Managed Care – PPO | Admitting: Cardiology

## 2017-03-09 ENCOUNTER — Telehealth (HOSPITAL_COMMUNITY): Payer: Self-pay | Admitting: *Deleted

## 2017-03-09 ENCOUNTER — Encounter: Payer: Self-pay | Admitting: Cardiology

## 2017-03-09 VITALS — BP 128/66 | HR 89 | Ht 74.0 in | Wt 265.0 lb

## 2017-03-09 DIAGNOSIS — R5383 Other fatigue: Secondary | ICD-10-CM

## 2017-03-09 DIAGNOSIS — R002 Palpitations: Secondary | ICD-10-CM

## 2017-03-09 DIAGNOSIS — R079 Chest pain, unspecified: Secondary | ICD-10-CM | POA: Diagnosis not present

## 2017-03-09 DIAGNOSIS — R42 Dizziness and giddiness: Secondary | ICD-10-CM | POA: Diagnosis not present

## 2017-03-09 DIAGNOSIS — R0600 Dyspnea, unspecified: Secondary | ICD-10-CM

## 2017-03-09 LAB — FRUCTOSAMINE: Fructosamine: 494 umol/L — ABNORMAL HIGH (ref 190–270)

## 2017-03-09 NOTE — Telephone Encounter (Signed)
Patient given detailed instructions per Myocardial Perfusion Study Information Sheet for the test on  03/15/17 Patient notified to arrive 15 minutes early and that it is imperative to arrive on time for appointment to keep from having the test rescheduled.  If you need to cancel or reschedule your appointment, please call the office within 24 hours of your appointment. . Patient verbalized understanding. Darly Fails Jacqueline    

## 2017-03-09 NOTE — Progress Notes (Signed)
03/09/2017 [name]   May 11, 1961  [id number]  Primary Physician Joaquim Nam, MD Primary Cardiologist: Dr. Clifton James    Reason for Visit/CC: Exertional Fatigue, dyspnea, CP, Palpitations  HPI:  56 y.o. male with a history of HTN, HLD, morbid obesity status post lap band surgery, diabetes mellitus, sleep apnea and CAD who presents to clinic today with multiple complaints including exertional fatigue, dyspnea, palpitations and occasional CP.  He is followed by Dr. Clifton James. Stress Myoview in 2015 demonstrated apical defect (possible scar). Cardiac cath in 2015 demonstrated mild plaque but no obstructive disease. LVEDP was elevated and continued management of diastolic CHF was recommended. Echo 03/26/14 with normal LV systolic function, grade 1 diastolic dysfunction.   He presents to clinic today with his wife. He reports his symptoms have been going on for several months but have gradually worsened. He denies any symptoms at rest however with physical activity, especially with use of his upper extremities, he gets very fatigued and tired. He also gets short of breath and occasionally has some occasional chest discomfort with this. He also notes palpitations with activity and dizziness however he denies syncope/near syncope. Interestingly, he denies any exertional or dyspnea with walking. His symptoms only occur when he's having to use a large portion of his upper body.  In addition, he has also had an unexplained, unintentional, 30 pound weight loss in the course of 3 months. He notes decreased appetite. He denies any melena or hematochezia. He had a colonoscopy at age 17 which was normal. He was instructed to follow-up in 10 years. He saw his PCP last week to obtain basic laboratory work including a CBC, basic metabolic panel, BNP as well as thyroid function studies. All of these labs were unremarkable. His hemoglobin A1c however was noted to be markedly abnormal at 12. His PCP also check an  EKG which showed normal sinus rhythm, no ischemic abnormalities. He is currently asymptomatic in clinic. VSS.    Current Meds  Medication Sig  . albuterol (PROVENTIL HFA;VENTOLIN HFA) 108 (90 Base) MCG/ACT inhaler Inhale 1-2 puffs into the lungs every 6 (six) hours as needed for wheezing or shortness of breath.  Marland Kitchen aspirin EC 81 MG tablet Take 81 mg by mouth daily.  Marland Kitchen atorvastatin (LIPITOR) 40 MG tablet Take 1 tablet (40 mg total) by mouth daily.  . B-D INS SYR ULTRAFINE 1CC/31G 31G X 5/16" 1 ML MISC USE FOR INSULIN INJECTIONS THREE TIMES DAILY  . Calcium-Magnesium-Vitamin D (CALCIUM 500 PO) Take 500 mg by mouth 3 (three) times daily.  . cyclobenzaprine (FLEXERIL) 5 MG tablet Take 5 mg by mouth daily as needed for muscle spasms.  . famotidine (PEPCID) 20 MG tablet TAKE ONE (1) TABLET BY MOUTH EVERY NIGHTAT BEDTIME  . FLUoxetine (PROZAC) 20 MG tablet Take 20 mg by mouth daily as needed.  Marland Kitchen glucose blood (ONE TOUCH ULTRA TEST) test strip Test blood sugar 3 times daily. Dx code: E11.9. Insulin treated  . Insulin Detemir (LEVEMIR) 100 UNIT/ML Pen Inject 65 Units into the skin daily at 10 pm.  . losartan (COZAAR) 50 MG tablet Take 0.5-1 tablets (25-50 mg total) by mouth daily.  . metFORMIN (GLUCOPHAGE) 1000 MG tablet TAKE ONE TABLET BY MOUTH TWICE DAILY WITH MEALS.  . Multiple Vitamin (MULTIVITAMIN) capsule Take 1 capsule by mouth daily.  Marland Kitchen omeprazole (PRILOSEC) 40 MG capsule Take 40 mg by mouth daily as needed.  Marland Kitchen spironolactone (ALDACTONE) 25 MG tablet TAKE 1/2 TABLET BY MOUTH EVERY DAY.  Marland Kitchen  TRULICITY 0.75 MG/0.5ML SOPN INJECT 0.75 MG UNDER THE SKIN WEEKLY   Allergies  Allergen Reactions  . Diclofenac Sodium Shortness Of Breath, Itching and Other (See Comments)    Hypotension   . Voltaren [Diclofenac Sodium] Anaphylaxis   Past Medical History:  Diagnosis Date  . Allergic rhinitis, cause unspecified   . CHF (congestive heart failure) (HCC)   . Degeneration of intervertebral disc, site  unspecified   . Diabetes mellitus   . Hyperlipidemia   . Hypertension   . Impotence of organic origin   . Obesity, unspecified   . Obstructive sleep apnea (adult) (pediatric)   . Other chronic allergic conjunctivitis   . Personal history of colonic polyps    Family History  Problem Relation Age of Onset  . Cancer Father        skin  . Heart disease Father 60       AMI first age 65; stents.  . Diabetes Father   . Hypertension Father   . Hyperlipidemia Father   . Peripheral Artery Disease Father   . Parkinson's disease Father   . Cancer Paternal Grandfather        skin  . Diabetes Mother   . Arthritis Mother        total hip replacement  . Kidney failure Mother   . Depression Sister   . Diabetes Sister   . Cancer Maternal Uncle        lymphoma  . Prostate cancer Maternal Uncle   . Colon cancer Neg Hx    Past Surgical History:  Procedure Laterality Date  . admission  01/2009   [facility].  Syncopal event with nausea, diaphoresis, hypertension.  D-dimer elevated at 2.54 with negative CT chest. and VQ scan.  Cardiac enzymes negative x 3.  Labs normal.  Cardiology consult---cardiolite abn; cath, 2D-echo, holter monitor unrevealing.  SE H&V.  Marland Kitchen Allergy Consult  07/11/2010   SOB, itching, facial tingling, hypotension.  Allergy testing: no food allergies; +reaction to tree pollen, grass pollen, dust mites, mold.  Avoid diclofenac and similar products.  [name].  . APPENDECTOMY    . arthroscopy of knee  1980  . BACK SURGERY     x3  . COLONOSCOPY  07/11/2009   single polyp; repeat in 5 years.  [name].  . GI consult  07/11/2010   CT chest in ED (gastric thickening).  [name].  No EGD warranted due to lack of symptoms.  Marland Kitchen LAPAROSCOPIC GASTRIC BANDING  09/15/09  . LEFT HEART CATHETERIZATION WITH CORONARY ANGIOGRAM N/A 04/02/2014   Procedure: LEFT HEART CATHETERIZATION WITH CORONARY ANGIOGRAM;  Surgeon: Lesleigh Noe, MD;  Location: [facility] CATH LAB;  Service: Cardiovascular;  Laterality: N/A;    . QUADRICEPS REPAIR     left  . QUADRICEPS TENDON REPAIR     left  . ROTATOR CUFF REPAIR     right  . Sleep study  07/11/2010   repeat sleep study due to weight loss.  Severe OSA; CPAP titration to 8 cm.    . SPINE SURGERY    . TONSILLECTOMY AND ADENOIDECTOMY  1960   Social History   Social History  . Marital status: Married    Spouse name: N/A  . Number of children: 3  . Years of education: N/A   Occupational History  . 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 Smoking status: Former Smoker    Packs/day: 0.25  Years: 2.00    Types: Cigarettes  . Smokeless tobacco: Never Used  . Alcohol use 0.0 oz/week     Comment: occasional Beer once every two weeks on the weekends  . Drug use: No  . Sexual activity: Yes   Other Topics Concern  . Not on file   Social History Narrative   Married 1986       Children: 3 children, 3 grandchildren; 2 great grandkids      Lives: with wife, dog.      Employment: employed.      Tobacco: never      Alcohol: beer twice per month on weekends.      Drugs: none      Exercise: walking three days per week.      Seatbelt: 100% of time      Guns: loaded and secured/locked.      Sexual activity: sexually active with wife.      Sunscreen: yes.     Review of Systems: General: negative for chills, fever, night sweats or weight changes.  Cardiovascular: negative for chest pain, dyspnea on exertion, edema, orthopnea, palpitations, paroxysmal nocturnal dyspnea or shortness of breath Dermatological: negative for rash Respiratory: negative for cough or wheezing Urologic: negative for hematuria Abdominal: negative for nausea, vomiting, diarrhea, bright red blood per rectum, melena, or hematemesis Neurologic: negative for visual changes, syncope, or dizziness All other systems reviewed and are otherwise negative except as noted above.   Physical Exam:  Blood  pressure 128/66, pulse 89, height 6\' 2"  (1.88 m), weight 265 lb (120.2 kg), SpO2 99 %.  General appearance: alert, cooperative, no distress and moderately obese Neck: no carotid bruit and no JVD Lungs: clear to auscultation bilaterally Heart: regular rate and rhythm, S1, S2 normal, no murmur, click, rub or gallop Extremities: extremities normal, atraumatic, no cyanosis or edema Pulses: 2+ and symmetric Skin: Skin color, texture, turgor normal. No rashes or lesions Neurologic: Grossly normal  EKG not performed. EKG from PCP office last week reviewed  NSR 78 bpm-- personally reviewed   ASSESSMENT AND PLAN:   56 y.o. male with a history of HTN, HLD, morbid obesity status post lap band surgery, diabetes mellitus, sleep apnea and CAD who presents to clinic today with multiple complaints including exertional fatigue, dyspnea, palpitations and occasional CP. He is currently asymptomatic in clinic. Physical exam is benign. His PCP last week check basic laboratory work as well as an EKG. CBC, BMP, BNP, TSH all unremarkable and within normal limits. EKG also at PCP office has been reviewed which showed normal sinus rhythm no ischemic abnormalities. 78 bpm. We will plan for a Lexiscan nuclear stress test to rule out underlying coronary ischemia as well as a 2-D echo to assess LV function, wall motion and valve anatomy in addition to a 30 day cardiac monitor to rule out atrial/ventricular arrhythmias/ tach/brady arrhthymias. Follow-up in 5-6 weeks after monitor is complete. However if any significant findings on his stress test or echocardiogram, we will have him follow-up sooner to discuss additional workup.   Brittainy Delmer IslamSimmons PA-C, [facility] 03/09/2017 9:11 AM

## 2017-03-09 NOTE — Patient Instructions (Addendum)
Medication Instructions:   Your physician recommends that you continue on your current medications as directed. Please refer to the Current Medication list given to you today.    If you need a refill on your cardiac medications before your next appointment, please call your pharmacy.  Labwork: NONE ORDERED  TODAY    Testing/Procedures:    Your physician has recommended that you wear an event monitor. Event monitors are medical devices that record the heart's electrical activity. Doctors most often us these monitors to diagnose arrhythmias. Arrhythmias are problems with the speed or rhythm of the heartbeat. The monitor is a small, portable device. You can wear one while you do your normal daily activities. This is usually used to diagnose what is causing palpitations/syncope (passing out).    Your physician has requested that you have a lexiscan myoview. For further information please visit [email or web address]. Please follow instruction sheet, as given.  Your physician has requested that you have an echocardiogram. Echocardiography is a painless test that uses sound waves to create images of your heart. It provides your doctor with information about the size and shape of your heart and how well your heart's chambers and valves are working. This procedure takes approximately one hour. There are no restrictions for this procedure.    Follow-Up:  WITH SIMMONS IN  5 TO 6 WEEKS    Any Other Special Instructions Will Be Listed Below (If Applicable).

## 2017-03-15 ENCOUNTER — Other Ambulatory Visit: Payer: Self-pay | Admitting: Cardiology

## 2017-03-15 ENCOUNTER — Ambulatory Visit (HOSPITAL_BASED_OUTPATIENT_CLINIC_OR_DEPARTMENT_OTHER): Payer: BC Managed Care – PPO

## 2017-03-15 ENCOUNTER — Other Ambulatory Visit: Payer: Self-pay

## 2017-03-15 ENCOUNTER — Ambulatory Visit (HOSPITAL_COMMUNITY): Payer: BC Managed Care – PPO | Attending: Cardiology

## 2017-03-15 DIAGNOSIS — R0600 Dyspnea, unspecified: Secondary | ICD-10-CM

## 2017-03-15 DIAGNOSIS — E119 Type 2 diabetes mellitus without complications: Secondary | ICD-10-CM | POA: Diagnosis not present

## 2017-03-15 DIAGNOSIS — R079 Chest pain, unspecified: Secondary | ICD-10-CM | POA: Diagnosis not present

## 2017-03-15 DIAGNOSIS — R5383 Other fatigue: Secondary | ICD-10-CM | POA: Diagnosis not present

## 2017-03-15 DIAGNOSIS — Z8249 Family history of ischemic heart disease and other diseases of the circulatory system: Secondary | ICD-10-CM | POA: Diagnosis not present

## 2017-03-15 DIAGNOSIS — R002 Palpitations: Secondary | ICD-10-CM | POA: Diagnosis not present

## 2017-03-15 DIAGNOSIS — E785 Hyperlipidemia, unspecified: Secondary | ICD-10-CM | POA: Insufficient documentation

## 2017-03-15 DIAGNOSIS — R9439 Abnormal result of other cardiovascular function study: Secondary | ICD-10-CM | POA: Diagnosis not present

## 2017-03-15 DIAGNOSIS — I10 Essential (primary) hypertension: Secondary | ICD-10-CM | POA: Diagnosis not present

## 2017-03-15 DIAGNOSIS — R0609 Other forms of dyspnea: Secondary | ICD-10-CM | POA: Insufficient documentation

## 2017-03-15 DIAGNOSIS — G4730 Sleep apnea, unspecified: Secondary | ICD-10-CM | POA: Diagnosis not present

## 2017-03-15 DIAGNOSIS — I2510 Atherosclerotic heart disease of native coronary artery without angina pectoris: Secondary | ICD-10-CM | POA: Insufficient documentation

## 2017-03-15 LAB — MYOCARDIAL PERFUSION IMAGING
CHL CUP NUCLEAR SSS: 7
LV sys vol: 40 mL
LVDIAVOL: 92 mL (ref 62–150)
Peak HR: 110 {beats}/min
RATE: 0.27
Rest HR: 78 {beats}/min
SDS: 1
SRS: 8
TID: 0.86

## 2017-03-15 LAB — ECHOCARDIOGRAM COMPLETE
Height: 74 in
Weight: 4240 oz

## 2017-03-15 MED ORDER — PERFLUTREN LIPID MICROSPHERE
1.0000 mL | INTRAVENOUS | Status: AC | PRN
  Administered 2017-03-15: 2 mL via INTRAVENOUS

## 2017-03-15 MED ORDER — TECHNETIUM TC 99M TETROFOSMIN IV KIT
32.6000 | PACK | Freq: Once | INTRAVENOUS | Status: AC
  Administered 2017-03-15: 32.6 via INTRAVENOUS
  Filled 2017-03-15: qty 33

## 2017-03-15 MED ORDER — REGADENOSON 0.4 MG/5ML IV SOLN
0.4000 mg | Freq: Once | INTRAVENOUS | Status: AC
  Administered 2017-03-15: 0.4 mg via INTRAVENOUS

## 2017-03-15 MED ORDER — TECHNETIUM TC 99M TETROFOSMIN IV KIT
10.1000 | PACK | Freq: Once | INTRAVENOUS | Status: AC | PRN
  Administered 2017-03-15: 10.1 via INTRAVENOUS
  Filled 2017-03-15: qty 11

## 2017-03-23 ENCOUNTER — Ambulatory Visit (INDEPENDENT_AMBULATORY_CARE_PROVIDER_SITE_OTHER): Payer: BC Managed Care – PPO

## 2017-03-23 DIAGNOSIS — R002 Palpitations: Secondary | ICD-10-CM | POA: Diagnosis not present

## 2017-03-23 DIAGNOSIS — R42 Dizziness and giddiness: Secondary | ICD-10-CM | POA: Diagnosis not present

## 2017-03-23 DIAGNOSIS — R5383 Other fatigue: Secondary | ICD-10-CM | POA: Diagnosis not present

## 2017-03-27 ENCOUNTER — Ambulatory Visit: Payer: BC Managed Care – PPO | Admitting: Internal Medicine

## 2017-04-06 ENCOUNTER — Encounter: Payer: Self-pay | Admitting: Internal Medicine

## 2017-04-06 ENCOUNTER — Ambulatory Visit (INDEPENDENT_AMBULATORY_CARE_PROVIDER_SITE_OTHER): Payer: BC Managed Care – PPO | Admitting: Internal Medicine

## 2017-04-06 VITALS — BP 132/82 | HR 90 | Wt 264.0 lb

## 2017-04-06 DIAGNOSIS — E1165 Type 2 diabetes mellitus with hyperglycemia: Secondary | ICD-10-CM

## 2017-04-06 DIAGNOSIS — E669 Obesity, unspecified: Secondary | ICD-10-CM

## 2017-04-06 DIAGNOSIS — E1159 Type 2 diabetes mellitus with other circulatory complications: Secondary | ICD-10-CM | POA: Diagnosis not present

## 2017-04-06 DIAGNOSIS — Z23 Encounter for immunization: Secondary | ICD-10-CM | POA: Diagnosis not present

## 2017-04-06 DIAGNOSIS — Z6833 Body mass index (BMI) 33.0-33.9, adult: Secondary | ICD-10-CM

## 2017-04-06 MED ORDER — DULAGLUTIDE 1.5 MG/0.5ML ~~LOC~~ SOAJ: 1.5000 mg | SUBCUTANEOUS | 5 refills | Status: DC

## 2017-04-06 NOTE — Progress Notes (Signed)
Patient ID: [name], male   DOB: [birth year], 58 y.o.   MRN: [id number]   HPI: [name] is a 56 y.o.-year-old male, returning for f/u for DM2, dx in 1993, insulin-dependent, uncontrolled since ~2005, with complications (CAD, CHF, gastroparesis, ED). Last visit 8 mo ago.  He was seen by PCP 1 month ago, and at that time, he was off NovoLog (?). Not surprisingly, his HbA1c was still very high. Sugars 100 to 300s. He restarted NovoLog since then.  He lost 40 lbs since last visit! This was unintentional.  Last hemoglobin A1c was: 03/07/2017: HbA1c calculated from the fructosamine 10% Lab Results  Component Value Date   HGBA1C 12.2 (H) 03/07/2017   HGBA1C 12.7 (H) 10/11/2016   HGBA1C 12.1 08/09/2016   Pt is on a regimen of: - Metformin 1000 mg 2x a day, with meals - Novolog 20-30 units 3x a day - more compliant after retirement 06/09/2016 >> 20-25 units tid (may miss lunchtime Novolog dose) - Levemir 82 >> 75 >> 65 units at night - Trulicity 0.75 mg weekly - started 06/2016 - started 07/2016. He also tried other oral meds >> cannot remember name.  Pt checks his sugars 1-2x a day a day: - am: 120s-150s >> 86-145, 202 >> 96-144, 168, 229, 280 (forgets insulin) - 2h after b'fast: n/c >> 245 >> n/c - before lunch: n/c  >> 75-155 >> 166-222, 257 - 2h after lunch: n/c - before dinner: 200s >> 78-302 (higher if missed ins. With lunch) >> 206-450 (forgot am meds) - 2h after dinner: high 200s-300s, 400s before retirement >>  85-201 >> 300-412 - bedtime: n/c - nighttime: n/c >> 68-82 >> n/c Lowest sugar was 60s- at night >> 96; he has hypoglycemia awareness at 90. Highest sugar was 400s >> 302 >> 450.  Glucometer: One Touch Ultra   Pt's meals are: - Breakfast (usu. 30 units): sausage, 1 egg, bacon, rarely bisquit; cereals - Lunch (usu. 20 units): soup, sandwich - when working: fast food  - Dinner (usu. 30 units): meat/fish + vegetable, including sweet potatoes + occasionally:  starch - Snacks: no dessert usually, cookies, crackers; diet soda, occasionally regular sodas, occasionally milk  He has a h/o lap band sx. In 2012  - No CKD, last BUN/creatinine:  Lab Results  Component Value Date   BUN 19 03/07/2017   BUN 19 10/11/2016   CREATININE 0.88 03/07/2017   CREATININE 0.92 10/11/2016  On Losartan. - last set of lipids: Lab Results  Component Value Date   CHOL 146 10/11/2016   HDL 40.80 10/11/2016   LDLCALC 87 10/11/2016   TRIG 91.0 10/11/2016   CHOLHDL 4 10/11/2016  On Atorvavastatin. - last eye exam was in 2016 >> No DR.  - he has numbness in his legs after back surgery. Had NCT. On ASA 81.   He had RMSF in 2012.  ROS: Constitutional: + weight loss, no fatigue, no subjective hyperthermia, no subjective hypothermia Eyes: no blurry vision, no xerophthalmia ENT: no sore throat, no nodules palpated in throat, no dysphagia, no odynophagia, no hoarseness Cardiovascular: no CP/no SOB/no palpitations/no leg swelling Respiratory: no cough/no SOB/no wheezing Gastrointestinal: no N/no V/no D/no C/no acid reflux Musculoskeletal: no muscle aches/no joint aches Skin: no rashes, no hair loss Neurological: no tremors/+ numbness/+ tingling/no dizziness  I reviewed pt's medications, allergies, PMH, social hx, family hx, and changes were documented in the history of present illness. Otherwise, unchanged from my initial visit note.   Past Medical History:  Diagnosis Date  . Allergic rhinitis, cause unspecified   . CHF (congestive heart failure) (HCC)   . Degeneration of intervertebral disc, site unspecified   . Diabetes mellitus   . Hyperlipidemia   . Hypertension   . Impotence of organic origin   . Obesity, unspecified   . Obstructive sleep apnea (adult) (pediatric)   . Other chronic allergic conjunctivitis   . Personal history of colonic polyps    Past Surgical History:  Procedure Laterality Date  . admission  01/2009   [facility].  Syncopal event  with nausea, diaphoresis, hypertension.  D-dimer elevated at 2.54 with negative CT chest. and VQ scan.  Cardiac enzymes negative x 3.  Labs normal.  Cardiology consult---cardiolite abn; cath, 2D-echo, holter monitor unrevealing.  SE H&V.  Marland Kitchen Allergy Consult  07/11/2010   SOB, itching, facial tingling, hypotension.  Allergy testing: no food allergies; +reaction to tree pollen, grass pollen, dust mites, mold.  Avoid diclofenac and similar products.  [name].  . APPENDECTOMY    . arthroscopy of knee  1980  . BACK SURGERY     x3  . COLONOSCOPY  07/11/2009   single polyp; repeat in 5 years.  [name].  . GI consult  07/11/2010   CT chest in ED (gastric thickening).  [name].  No EGD warranted due to lack of symptoms.  Marland Kitchen LAPAROSCOPIC GASTRIC BANDING  09/15/09  . LEFT HEART CATHETERIZATION WITH CORONARY ANGIOGRAM N/A 04/02/2014   Procedure: LEFT HEART CATHETERIZATION WITH CORONARY ANGIOGRAM;  Surgeon: Lesleigh Noe, MD;  Location[facility] C[facility] LAB;  Service: Cardiovascular;  Laterality: N/A;  . QUADRICEPS REPAIR     left  . QUADRICEPS TENDON REPAIR     left  . ROTATOR CUFF REPAIR     right  . Sleep study  07/11/2010   repeat sleep study due to weight loss.  Severe OSA; CPAP titration to 8 cm.    . SPINE SURGERY    . TONSILLECTOMY AND ADENOIDECTOMY  1960   Social History   Social History  . Marital status: Married    Spouse name: N/A  . Number of children: 3  . Years of education: N/A   Occupational History  . 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 Smoking status: Former Smoker, quit in 1984    Packs/day: 0.25    Years: 2.00    Types: Cigarettes  . Smokeless tobacco: Never Used  . Alcohol use 0.0 oz/week     Comment: occasional Beer once every two weeks on the weekends  . Drug use: No   Social History Narrative   Married 1986       Children: 3 children, 3 grandchildren; 2 great grandkids      Lives: with wife,  dog.      Alcohol: beer twice per month on weekends.      Drugs: none      Exercise: walking three days per week.   Current Outpatient Prescriptions on File Prior to Visit  Medication Sig Dispense Refill  . albuterol (PROVENTIL HFA;VENTOLIN HFA) 108 (90 Base) MCG/ACT inhaler Inhale 1-2 puffs into the lungs every 6 (six) hours as needed for wheezing or shortness of breath. 1 Inhaler 1  . aspirin EC 81 MG tablet Take 81 mg by mouth daily.    Marland Kitchen atorvastatin (LIPITOR) 40 MG tablet Take 1 tablet (40 mg total) by mouth daily. 90 tablet 3  .  B-D INS SYR ULTRAFINE 1CC/31G 31G X 5/16" 1 ML MISC USE FOR INSULIN INJECTIONS THREE TIMES DAILY 100 each 5  . Calcium-Magnesium-Vitamin D (CALCIUM 500 PO) Take 500 mg by mouth 3 (three) times daily.    . cyclobenzaprine (FLEXERIL) 5 MG tablet Take 5 mg by mouth daily as needed for muscle spasms.    . famotidine (PEPCID) 20 MG tablet TAKE ONE (1) TABLET BY MOUTH EVERY NIGHTAT BEDTIME 30 tablet 0  . FLUoxetine (PROZAC) 20 MG tablet Take 20 mg by mouth daily as needed.    Marland Kitchen glucose blood (ONE TOUCH ULTRA TEST) test strip Test blood sugar 3 times daily. Dx code: E11.9. Insulin treated 300 each 3  . Insulin Detemir (LEVEMIR) 100 UNIT/ML Pen Inject 65 Units into the skin daily at 10 pm. 15 mL 5  . losartan (COZAAR) 50 MG tablet Take 0.5-1 tablets (25-50 mg total) by mouth daily. 90 tablet 3  . metFORMIN (GLUCOPHAGE) 1000 MG tablet TAKE ONE TABLET BY MOUTH TWICE DAILY WITH MEALS. 60 tablet 5  . Multiple Vitamin (MULTIVITAMIN) capsule Take 1 capsule by mouth daily.    Marland Kitchen omeprazole (PRILOSEC) 40 MG capsule Take 40 mg by mouth daily as needed.    Marland Kitchen spironolactone (ALDACTONE) 25 MG tablet TAKE 1/2 TABLET BY MOUTH EVERY DAY. 45 tablet 3  . TRULICITY 0.75 MG/0.5ML SOPN INJECT 0.75 MG UNDER THE SKIN WEEKLY 2 mL 2   No current facility-administered medications on file prior to visit.    Allergies  Allergen Reactions  . Diclofenac Sodium Shortness Of Breath, Itching and  Other (See Comments)    Hypotension   . Voltaren [Diclofenac Sodium] Anaphylaxis   Family History  Problem Relation Age of Onset  . Cancer Father        skin  . Heart disease Father 77       AMI first age 41; stents.  . Diabetes Father   . Hypertension Father   . Hyperlipidemia Father   . Peripheral Artery Disease Father   . Parkinson's disease Father   . Cancer Paternal Grandfather        skin  . Diabetes Mother   . Arthritis Mother        total hip replacement  . Kidney failure Mother   . Depression Sister   . Diabetes Sister   . Cancer Maternal Uncle        lymphoma  . Prostate cancer Maternal Uncle   . Colon cancer Neg Hx    PE: BP 132/82 (BP Location: Left Arm, Patient Position: Sitting)   Pulse 90   Wt 264 lb (119.7 kg)   SpO2 96%   BMI 33.90 kg/m  Wt Readings from Last 3 Encounters:  04/06/17 264 lb (119.7 kg)  03/15/17 265 lb (120.2 kg)  03/09/17 265 lb (120.2 kg)   Constitutional: obese, in NAD Eyes: PERRLA, EOMI, no exophthalmos ENT: moist mucous membranes, no thyromegaly, no cervical lymphadenopathy Cardiovascular: RRR, No MRG Respiratory: CTA B Gastrointestinal: abdomen soft, NT, ND, BS+ Musculoskeletal: no deformities, strength intact in all 4 Skin: moist, warm, no rashes Neurological: no tremor with outstretched hands, DTR normal in all 4  ASSESSMENT: 1. DM2, insulin-dependent, uncontrolled, with complications - CAD - CHF - gastroparesis - ED  2. Obesity class 1 BMI Classification:  < 18.5 underweight   18.5-24.9 normal weight   25.0-29.9 overweight   30.0-34.9 class I obesity   35.0-39.9 class II obesity   ? 40.0 class III obesity   PLAN:  1.  Patient with long-standing, Uncontrolled diabetes, returning after a long absence. He is on metformin, basal insulin, bolus insulin, and Trulicity, with initially greatly improved blood sugar after adding Trulicity, but now with worsened control as he was off NovoLog. He restarted it a  month ago, after an HbA1c returned very high, however, he is still missing many doses and ends up with a staircase pattern of increased CBGs throughout the day and an abrupt relief of decreasing blood sugars overnight. We discussed that this is usually a pattern seen in lack of mealtime coverage and I strongly advised him to not miss doses. I would not increase his NovoLog doses since compliance is key. However, we will increase the Trulicity dose and this will also help with postprandial sugars. He has no side effects from Trulicity. - I suggested to:  Patient Instructions  Please continue: - Levemir 65 units at bedtime - Novolog 20-25 units before meals. DO NOT SKIP DOSES! - Metformin 1000 mg 2x a day, with meals  Increase: - Trulicity to 1.5 mg weekly   Please return in 2 months with your sugar log.  - continue checking sugars at different times of the day - check 3x a day, rotating checks - advised for yearly eye exams >> he needs one - will give flu shot today - Return to clinic in 2 mo with sugar log   2. Obesity class 1 - he lost 40 lbs unintentionally since last visit, likely 2/2 lack of insulin (glucotoxicity) - we discussed the mech of hyperglycemia-induced weight loss - will not increase the insulin doses, but he needs to take Novolog as advised.  Carlus Pavlov, MD PhD [facility]

## 2017-04-06 NOTE — Addendum Note (Signed)
Addended by: Darene Lamer T on: 04/06/2017 08:30 AM   Modules accepted: Orders

## 2017-04-06 NOTE — Patient Instructions (Addendum)
Please continue: - Levemir 65 units at bedtime - Novolog 20-25 units before meals. DO NOT SKIP DOSES! - Metformin 1000 mg 2x a day, with meals  Increase: - Trulicity to 1.5 mg weekly   Please return in 2 months with your sugar log.

## 2017-04-17 ENCOUNTER — Encounter (HOSPITAL_COMMUNITY): Payer: Self-pay

## 2017-04-25 ENCOUNTER — Ambulatory Visit (INDEPENDENT_AMBULATORY_CARE_PROVIDER_SITE_OTHER): Payer: BC Managed Care – PPO | Admitting: Cardiology

## 2017-04-25 ENCOUNTER — Encounter: Payer: Self-pay | Admitting: Cardiology

## 2017-04-25 VITALS — BP 150/80 | HR 88 | Ht 74.0 in | Wt 268.2 lb

## 2017-04-25 DIAGNOSIS — I2510 Atherosclerotic heart disease of native coronary artery without angina pectoris: Secondary | ICD-10-CM

## 2017-04-25 MED ORDER — METOPROLOL TARTRATE 25 MG PO TABS: 12.5000 mg | ORAL_TABLET | Freq: Two times a day (BID) | ORAL | 3 refills | Status: DC

## 2017-04-25 NOTE — Patient Instructions (Signed)
Your physician has recommended you make the following change in your medication: START METOPROLOL 12.5MG  TWICE DAILY  Your physician wants you to follow-up in:   6 MONTHS WITH DR  Clifton James  You will receive a reminder letter in the mail two months in advance. If you don't receive a letter, please call our office to schedule the follow-up appointment.
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
## 2017-04-27 ENCOUNTER — Other Ambulatory Visit: Payer: Self-pay | Admitting: Internal Medicine

## 2017-05-05 ENCOUNTER — Other Ambulatory Visit: Payer: Self-pay | Admitting: Family Medicine

## 2017-06-05 ENCOUNTER — Ambulatory Visit: Payer: BC Managed Care – PPO | Admitting: Internal Medicine

## 2017-06-21 ENCOUNTER — Other Ambulatory Visit: Payer: Self-pay | Admitting: Family Medicine

## 2017-06-21 NOTE — Telephone Encounter (Signed)
Electronic refill request Last office visit 03/07/17 Last refill 02/21/17 #30/1

## 2017-06-22 NOTE — Telephone Encounter (Signed)
Sent. Thanks.   

## 2017-06-24 ENCOUNTER — Other Ambulatory Visit: Payer: Self-pay | Admitting: Family Medicine

## 2017-06-28 ENCOUNTER — Other Ambulatory Visit: Payer: Self-pay | Admitting: Family Medicine

## 2017-06-28 NOTE — Telephone Encounter (Signed)
Electronic refill request Last office visit 03/07/17

## 2017-06-28 NOTE — Telephone Encounter (Signed)
Sent. Thanks.   

## 2017-06-29 ENCOUNTER — Ambulatory Visit: Payer: BC Managed Care – PPO | Admitting: Internal Medicine

## 2017-06-29 ENCOUNTER — Encounter: Payer: Self-pay | Admitting: Internal Medicine

## 2017-06-29 VITALS — BP 126/80 | HR 91 | Ht 74.0 in | Wt 273.8 lb

## 2017-06-29 DIAGNOSIS — E669 Obesity, unspecified: Secondary | ICD-10-CM

## 2017-06-29 DIAGNOSIS — E1165 Type 2 diabetes mellitus with hyperglycemia: Secondary | ICD-10-CM

## 2017-06-29 DIAGNOSIS — Z6833 Body mass index (BMI) 33.0-33.9, adult: Secondary | ICD-10-CM

## 2017-06-29 DIAGNOSIS — E1159 Type 2 diabetes mellitus with other circulatory complications: Secondary | ICD-10-CM

## 2017-06-29 LAB — POCT GLYCOSYLATED HEMOGLOBIN (HGB A1C): Hemoglobin A1C: 11.6

## 2017-06-29 MED ORDER — EMPAGLIFLOZIN 25 MG PO TABS: 25.0000 mg | ORAL_TABLET | Freq: Every day | ORAL | 5 refills | Status: DC

## 2017-06-29 NOTE — Addendum Note (Signed)
Addended by: Yolande JollyLAWSON, Sacoya Mcgourty on: 06/29/2017 01:55 PM   Modules accepted: Orders

## 2017-06-29 NOTE — Patient Instructions (Signed)
Please continue: - Levemir 62-64 units at bedtime - Novolog 20-25 units 3x a day before meals. - Metformin 1000 mg 2x a day, with meals - Trulicity 1.5 mg weekly  Please start: - Jardiance 25 mg daily in am, before b'fast  Please return in 3 months with your sugar log.
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
## 2017-07-28 ENCOUNTER — Other Ambulatory Visit: Payer: Self-pay | Admitting: Family Medicine

## 2017-07-28 NOTE — Telephone Encounter (Signed)
Electronic refill request.  [name] PriceNovolog Flexpen   Last office visit:   03/07/17 Last Filled:   Not on current meds list. Should this go to Dr. Elvera LennoxGherghe?

## 2017-07-30 NOTE — Telephone Encounter (Signed)
I routed this to Dr. Elvera LennoxGherghe with appreciation.  Thanks.

## 2017-09-06 ENCOUNTER — Encounter: Payer: Self-pay | Admitting: Family Medicine

## 2017-09-07 ENCOUNTER — Telehealth: Payer: Self-pay

## 2017-09-07 DIAGNOSIS — E785 Hyperlipidemia, unspecified: Secondary | ICD-10-CM

## 2017-09-07 MED ORDER — ATORVASTATIN CALCIUM 40 MG PO TABS
40.0000 mg | ORAL_TABLET | Freq: Every day | ORAL | 0 refills | Status: DC
Start: 2017-09-07 — End: 2017-12-04

## 2017-09-07 NOTE — Telephone Encounter (Signed)
Sent refill to [facility] 

## 2017-09-11 ENCOUNTER — Telehealth: Payer: Self-pay

## 2017-09-11 DIAGNOSIS — I5032 Chronic diastolic (congestive) heart failure: Secondary | ICD-10-CM

## 2017-09-11 MED ORDER — SPIRONOLACTONE 25 MG PO TABS: 12.5000 mg | ORAL_TABLET | Freq: Every day | ORAL | 0 refills | Status: DC

## 2017-09-11 NOTE — Telephone Encounter (Signed)
Sent refill to [facility] 

## 2017-09-27 ENCOUNTER — Encounter: Payer: Self-pay | Admitting: Internal Medicine

## 2017-09-27 ENCOUNTER — Ambulatory Visit: Payer: BC Managed Care – PPO | Admitting: Internal Medicine

## 2017-09-27 VITALS — BP 152/84 | HR 89 | Ht 74.0 in | Wt 266.2 lb

## 2017-09-27 DIAGNOSIS — E1165 Type 2 diabetes mellitus with hyperglycemia: Secondary | ICD-10-CM

## 2017-09-27 DIAGNOSIS — E1159 Type 2 diabetes mellitus with other circulatory complications: Secondary | ICD-10-CM

## 2017-09-27 DIAGNOSIS — E669 Obesity, unspecified: Secondary | ICD-10-CM

## 2017-09-27 DIAGNOSIS — Z6833 Body mass index (BMI) 33.0-33.9, adult: Secondary | ICD-10-CM

## 2017-09-27 LAB — BASIC METABOLIC PANEL WITH GFR
BUN: 19 mg/dL (ref 7–25)
CO2: 29 mmol/L (ref 20–32)
Calcium: 9.4 mg/dL (ref 8.6–10.3)
Chloride: 101 mmol/L (ref 98–110)
Creat: 1.02 mg/dL (ref 0.70–1.33)
GFR, EST AFRICAN AMERICAN: 93 mL/min/{1.73_m2} (ref >=60)
GFR, Est Non African American: 81 mL/min/{1.73_m2} (ref >=60)
Glucose, Bld: 177 mg/dL — ABNORMAL HIGH (ref 65–99)
POTASSIUM: 5.3 mmol/L (ref 3.5–5.3)
Sodium: 136 mmol/L (ref 135–146)

## 2017-09-27 LAB — HEMOGLOBIN A1C: Hgb A1c MFr Bld: 11.6 % — ABNORMAL HIGH (ref 4.6–6.5)

## 2017-09-27 MED ORDER — EMPAGLIFLOZIN 25 MG PO TABS: 25.0000 mg | ORAL_TABLET | Freq: Every day | ORAL | 11 refills | Status: DC

## 2017-09-27 MED ORDER — DULAGLUTIDE 1.5 MG/0.5ML ~~LOC~~ SOAJ: SUBCUTANEOUS | 11 refills | Status: DC

## 2017-09-27 MED ORDER — INSULIN ASPART 100 UNIT/ML FLEXPEN: 20.0000 [IU] | PEN_INJECTOR | Freq: Three times a day (TID) | SUBCUTANEOUS | 11 refills | Status: DC

## 2017-09-27 MED ORDER — METFORMIN HCL 1000 MG PO TABS: 1000.0000 mg | ORAL_TABLET | Freq: Two times a day (BID) | ORAL | 11 refills | Status: DC

## 2017-09-27 MED ORDER — INSULIN DETEMIR 100 UNIT/ML FLEXPEN: PEN_INJECTOR | SUBCUTANEOUS | 11 refills | Status: DC

## 2017-09-27 NOTE — Patient Instructions (Addendum)
Please continue: - Metformin 1000 mg 2x a day, with meals - Jardiance 25 mg before breakfast - Trulicity 1.5 mg weekly >> move this to am  Please decrease:  - Levemir to 60 units at bedtime  Please try to take: - Novolog 20-25 units 3x a day before EACH meal. If you have to skip it, document that on the paper log.  Please stop at the lab.  Please return in 3 months with your sugar log.

## 2017-09-27 NOTE — Progress Notes (Addendum)
Patient ID: [name], male   DOB: 06/16/1961, 57 y.o.   MRN: [id number]   HPI: [name] is a 57 y.o.-year-old male, returning for f/u for DM2, dx in 1993, insulin-dependent, uncontrolled since ~2005, with complications (CAD, CHF, gastroparesis, ED). Last visit 3 months ago.  Last hemoglobin A1c was: Lab Results  Component Value Date   HGBA1C 11.6 06/29/2017   HGBA1C 12.2 (H) 03/07/2017   HGBA1C 12.7 (H) 10/11/2016  03/07/2017: HbA1c calculated from the fructosamine 10%  Pt is on a regimen of:  - Metformin 1000 mg 2x a day, with meals - Jardiance 25 mg before breakfast - started 06/2017 - Trulicity 1.5 mg weekly - Levemir 62-64 units at bedtime - Novolog 20-25 units 3x a day before meals. (not taking it when out) He also tried other oral meds >> cannot remember name.  Pt checks his sugars 1-2 times a day: - am:96-144, 168, 229, 280 >> 111-167, 200-222 >> 60, 75-135, 200 - 2h after b'fast: n/c >> 245 >> n/c >> 98-203 >> 143-181 - before lunch: 63, 102-198, 277, 294 (forgot insulin)  >> 145-206 - 2h after lunch: n/c >> 3-5 (meds late) >> n/c - before dinner: > 206-450 (forgot am meds) >> 252 >> 205-358 - 2h after dinner:  85-201 >> 300-412 >> 248 >> n/c  - bedtime: n/c >> 201 >> 210, 244, 278 - nighttime: n/c >> 68-82 >> n/c Lowest sugar was 63 >>  60; he has hypoglycemia awareness in the 90s. Highest sugar was 305 >> 358.  Glucometer: One Touch Ultra   Pt's meals are: - Breakfast (usu. 30 units): sausage, 1 egg, bacon, rarely bisquit; cereals - Lunch (usu. 20 units): soup, sandwich - when working: fast food  - Dinner (usu. 30 units): meat/fish + vegetable, including sweet potatoes + occasionally: starch - Snacks: no dessert usually, cookies, crackers; diet soda, occasionally regular sodas, occasionally milk  He has a h/o lap band sx. in 2012  -+ CKD, last BUN/creatinine:  Lab Results  Component Value Date   BUN 19 03/07/2017   BUN 19 10/11/2016   CREATININE 0.88  03/07/2017   CREATININE 0.92 10/11/2016  Off Losartan, Valsartan >> SOB, sweating, dizziness.  Now on metoprolol. -+ HL; last set of lipids: Lab Results  Component Value Date   CHOL 146 10/11/2016   HDL 40.80 10/11/2016   LDLCALC 87 10/11/2016   TRIG 91.0 10/11/2016   CHOLHDL 4 10/11/2016  On atorvastatin. - last eye exam was in 2016: No DR -+ Numbness in his legs after back surgery. Had NCT. On ASA 81.  He had RMSF in 2012.  ROS: Constitutional: no weight gain/no weight loss, no fatigue, no subjective hyperthermia, no subjective hypothermia Eyes: no blurry vision, no xerophthalmia ENT: no sore throat, no nodules palpated in throat, no dysphagia, no odynophagia, no hoarseness Cardiovascular: no CP/no SOB/no palpitations/no leg swelling Respiratory: no cough/no SOB/no wheezing Gastrointestinal: no N/no V/no D/no C/no acid reflux Musculoskeletal: no muscle aches/no joint aches Skin: no rashes, no hair loss Neurological: no tremors/+ numbness/+ tingling/no dizziness  I reviewed pt's medications, allergies, PMH, social hx, family hx, and changes were documented in the history of present illness. Otherwise, unchanged from my initial visit note.   Past Medical History:  Diagnosis Date  . Allergic rhinitis, cause unspecified   . CHF (congestive heart failure) (HCC)   . Degeneration of intervertebral disc, site unspecified   . Diabetes mellitus   . Hyperlipidemia   . Hypertension   .  Impotence of organic origin   . Obesity, unspecified   . Obstructive sleep apnea (adult) (pediatric)   . Other chronic allergic conjunctivitis   . Personal history of colonic polyps    Past Surgical History:  Procedure Laterality Date  . admission  01/2009   [facility].  Syncopal event with nausea, diaphoresis, hypertension.  D-dimer elevated at 2.54 with negative CT chest. and VQ scan.  Cardiac enzymes negative x 3.  Labs normal.  Cardiology consult---cardiolite abn; cath, 2D-echo, holter monitor  unrevealing.  SE H&V.  Marland Kitchen Allergy Consult  07/11/2010   SOB, itching, facial tingling, hypotension.  Allergy testing: no food allergies; +reaction to tree pollen, grass pollen, dust mites, mold.  Avoid diclofenac and similar products.  [name].  . APPENDECTOMY    . arthroscopy of knee  1980  . BACK SURGERY     x3  . COLONOSCOPY  07/11/2009   single polyp; repeat in 5 years.  [name].  . GI consult  07/11/2010   CT chest in ED (gastric thickening).  [name].  No EGD warranted due to lack of symptoms.  Marland Kitchen LAPAROSCOPIC GASTRIC BANDING  09/15/09  . LEFT HEART CATHETERIZATION WITH CORONARY ANGIOGRAM N/A 04/02/2014   Procedure: LEFT HEART CATHETERIZATION WITH CORONARY ANGIOGRAM;  Surgeon: Lesleigh Noe, MD;  Location: [facility] CATH LAB;  Service: Cardiovascular;  Laterality: N/A;  . QUADRICEPS REPAIR     left  . QUADRICEPS TENDON REPAIR     left  . ROTATOR CUFF REPAIR     right  . Sleep study  07/11/2010   repeat sleep study due to weight loss.  Severe OSA; CPAP titration to 8 cm.    . SPINE SURGERY    . TONSILLECTOMY AND ADENOIDECTOMY  1960   Social History   Social History  . Marital status: Married    Spouse name: N/A  . Number of children: 3  . Years of education: N/A   Occupational History  . 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 Smoking status: Former Smoker, quit in 1984    Packs/day: 0.25    Years: 2.00    Types: Cigarettes  . Smokeless tobacco: Never Used  . Alcohol use 0.0 oz/week     Comment: occasional Beer once every two weeks on the weekends  . Drug use: No   Social History Narrative   Married 1986       Children: 3 children, 3 grandchildren; 2 great grandkids      Lives: with wife, dog.      Alcohol: beer twice per month on weekends.      Drugs: none      Exercise: walking three days per week.   Current Outpatient Medications on File Prior to Visit  Medication Sig Dispense Refill  .  albuterol (PROVENTIL HFA;VENTOLIN HFA) 108 (90 Base) MCG/ACT inhaler Inhale 1-2 puffs into the lungs every 6 (six) hours as needed for wheezing or shortness of breath. 1 Inhaler 1  . aspirin EC 81 MG tablet Take 81 mg by mouth daily.    Marland Kitchen atorvastatin (LIPITOR) 40 MG tablet Take 1 tablet (40 mg total) by mouth daily. 90 tablet 0  . B-D INS SYR ULTRAFINE 1CC/31G 31G X 5/16" 1 ML MISC USE FOR INSULIN INJECTIONS THREE TIMES DAILY 100 each 5  . Calcium-Magnesium-Vitamin D (CALCIUM 500 PO) Take 500 mg by mouth 3 (three) times daily.    Marland Kitchen  cyclobenzaprine (FLEXERIL) 5 MG tablet TAKE ONE TABLET BY MOUTH EVERY NIGHT AT BEDTIME 30 tablet 1  . empagliflozin (JARDIANCE) 25 MG TABS tablet Take 25 mg by mouth daily. 30 tablet 5  . famotidine (PEPCID) 20 MG tablet TAKE ONE (1) TABLET BY MOUTH EVERY NIGHTAT BEDTIME 30 tablet 0  . FLUoxetine (PROZAC) 20 MG tablet TAKE 1 TABLET BY MOUTH DAILY 30 tablet 5  . glucose blood (ONE TOUCH ULTRA TEST) test strip Test blood sugar 3 times daily. Dx code: E11.9. Insulin treated 300 each 3  . LEVEMIR FLEXTOUCH 100 UNIT/ML Pen INJECT 65 UNITS INTO THE SKIN DAILY AT 10:00PM 15 mL 5  . metFORMIN (GLUCOPHAGE) 1000 MG tablet TAKE 1 TABLET BY MOUTH TWICE A DAY WITH MEALS. 60 tablet 3  . Multiple Vitamin (MULTIVITAMIN) capsule Take 1 capsule by mouth daily.    Marland Kitchen omeprazole (PRILOSEC) 40 MG capsule Take 40 mg by mouth daily as needed (acid reflux).     Marland Kitchen spironolactone (ALDACTONE) 25 MG tablet Take 0.5 tablets (12.5 mg total) by mouth daily. 45 tablet 0  . TRULICITY 1.5 MG/0.5ML SOPN INJECT 1.5MG  INTO THE SKIN ONCE WEEKLY AS DIRECTED 8 mL 2  . metoprolol tartrate (LOPRESSOR) 25 MG tablet Take 0.5 tablets (12.5 mg total) by mouth 2 (two) times daily. 90 tablet 3   No current facility-administered medications on file prior to visit.    Allergies  Allergen Reactions  . Diclofenac Sodium Shortness Of Breath, Itching and Other (See Comments)    Hypotension   . Voltaren [Diclofenac  Sodium] Anaphylaxis   Family History  Problem Relation Age of Onset  . Cancer Father        skin  . Heart disease Father 76       AMI first age 58; stents.  . Diabetes Father   . Hypertension Father   . Hyperlipidemia Father   . Peripheral Artery Disease Father   . Parkinson's disease Father   . Cancer Paternal Grandfather        skin  . Diabetes Mother   . Arthritis Mother        total hip replacement  . Kidney failure Mother   . Depression Sister   . Diabetes Sister   . Cancer Maternal Uncle        lymphoma  . Prostate cancer Maternal Uncle   . Colon cancer Neg Hx    PE: BP (!) 152/84   Pulse 89   Ht 6\' 2"  (1.88 m)   Wt 266 lb 3.2 oz (120.7 kg)   SpO2 99%   BMI 34.18 kg/m  Wt Readings from Last 3 Encounters:  09/27/17 266 lb 3.2 oz (120.7 kg)  06/29/17 273 lb 12.8 oz (124.2 kg)  04/25/17 268 lb 3.2 oz (121.7 kg)   Constitutional: Obese, in NAD Eyes: PERRLA, EOMI, no exophthalmos ENT: moist mucous membranes, no thyromegaly, no cervical lymphadenopathy Cardiovascular: RRR, No MRG Respiratory: CTA B Gastrointestinal: abdomen soft, NT, ND, BS+ Musculoskeletal: no deformities, strength intact in all 4 Skin: moist, warm, no rashes Neurological: no tremor with outstretched hands, DTR normal in all 4  ASSESSMENT: 1. DM2, insulin-dependent, uncontrolled, with complications - CAD - CHF - gastroparesis - ED  2. Obesity class 1 BMI Classification:  < 18.5 underweight   18.5-24.9 normal weight   25.0-29.9 overweight   30.0-34.9 class I obesity   35.0-39.9 class II obesity   ? 40.0 class III obesity   PLAN:  1. Patient with long-standing, uncontrolled diabetes, on metformin,  basal-bolus insulin, and Trulicity, of which we increased the dose at last visit.  At last visit his diabetes control was worse as he was off NovoLog.  He only restarted approximately 1 months before I saw him last after an HbA1c returned very high, however, he was still missing many  doses and his sugars were increasing in a staircase pattern throughout the day.  I strongly advised him at that time to try not to miss NovoLog doses. He tells me he is still forgetting the pen at home and may be w/o insulin usually for lunch. - at this visit, despite adding Jardiance, his sugars are still very high as the day goes by as he is still missing many Novolog doses . We discussed that he needs to take this even if he is eating out >> he will try - will decrease the dose of Levemir slightly as he may have some low CBGs in am - will check a BMP today as we started Jardiance at last visit - I suggested to:  Patient Instructions  Please continue: - Metformin 1000 mg 2x a day, with meals - Jardiance 25 mg before breakfast - Trulicity 1.5 mg weekly >> move this to am  Please decrease:  - Levemir to 60 units at bedtime  Please try to take: - Novolog 20-25 units 3x a day before EACH meal. If you have to skip it, document that on the paper log.  Please stop at the lab.  Please return in 3 months with your sugar log.  - today will check his HbA1c - continue checking sugars at different times of the day - check 3x a day, rotating checks - advised for yearly eye exams >> he is UTD - Return to clinic in 3 mo with sugar log    2. Obesity class 1 -  he lost 40 pounds in the recent past because of glucose toxicity, but weight plateaued afterwards - Since last visit, he lost 7 pounds - we will continue Trulicity and Jardiance which should both help with weight loss  Component     Latest Ref Rng & Units 09/27/2017          Glucose     65 - 99 mg/dL 454 (H)  BUN     7 - 25 mg/dL 19  Creatinine     0.98 - 1.33 mg/dL 1.19  GFR, Est Non African American     > OR = 60 mL/min/1.67m2 81  GFR, Est African American     > OR = 60 mL/min/1.57m2 93  BUN/Creatinine Ratio     6 - 22 (calc) NOT APPLICABLE  Sodium     135 - 146 mmol/L 136  Potassium     3.5 - 5.3 mmol/L 5.3  Chloride      98 - 110 mmol/L 101  CO2     20 - 32 mmol/L 29  Calcium     8.6 - 10.3 mg/dL 9.4  Hemoglobin J4N     4.6 - 6.5 % 11.6 (H)  HbA1c still very high >> he absolutely needs to improve his compliance. Cr slightly higher and potassium is at the upper limit of normal>> will advise him to stay well hydrated and we will recheck his kidney function at  next visit  Carlus Pavlov, MD PhD [facility] Endocrinology

## 2017-10-23 ENCOUNTER — Ambulatory Visit: Payer: BC Managed Care – PPO | Admitting: Cardiovascular Disease

## 2017-10-26 ENCOUNTER — Emergency Department: Payer: BC Managed Care – PPO

## 2017-10-26 ENCOUNTER — Ambulatory Visit: Payer: BC Managed Care – PPO | Admitting: Nurse Practitioner

## 2017-10-26 ENCOUNTER — Encounter: Payer: Self-pay | Admitting: Emergency Medicine

## 2017-10-26 ENCOUNTER — Emergency Department
Admission: EM | Admit: 2017-10-26 | Discharge: 2017-10-26 | Disposition: A | Discharge status: Home / Self Care | Payer: BC Managed Care – PPO | Attending: Emergency Medicine | Admitting: Emergency Medicine

## 2017-10-26 ENCOUNTER — Other Ambulatory Visit: Payer: Self-pay

## 2017-10-26 DIAGNOSIS — I110 Hypertensive heart disease with heart failure: Secondary | ICD-10-CM | POA: Insufficient documentation

## 2017-10-26 DIAGNOSIS — N200 Calculus of kidney: Secondary | ICD-10-CM

## 2017-10-26 DIAGNOSIS — Z794 Long term (current) use of insulin: Secondary | ICD-10-CM | POA: Diagnosis not present

## 2017-10-26 DIAGNOSIS — I5032 Chronic diastolic (congestive) heart failure: Secondary | ICD-10-CM | POA: Diagnosis not present

## 2017-10-26 DIAGNOSIS — Z7982 Long term (current) use of aspirin: Secondary | ICD-10-CM | POA: Diagnosis not present

## 2017-10-26 DIAGNOSIS — I2510 Atherosclerotic heart disease of native coronary artery without angina pectoris: Secondary | ICD-10-CM | POA: Insufficient documentation

## 2017-10-26 DIAGNOSIS — E1159 Type 2 diabetes mellitus with other circulatory complications: Secondary | ICD-10-CM | POA: Insufficient documentation

## 2017-10-26 DIAGNOSIS — Z79899 Other long term (current) drug therapy: Secondary | ICD-10-CM | POA: Insufficient documentation

## 2017-10-26 DIAGNOSIS — Z87891 Personal history of nicotine dependence: Secondary | ICD-10-CM | POA: Insufficient documentation

## 2017-10-26 DIAGNOSIS — R1032 Left lower quadrant pain: Secondary | ICD-10-CM | POA: Diagnosis present

## 2017-10-26 LAB — BASIC METABOLIC PANEL
ANION GAP: 7 (ref 5–15)
BUN: 22 mg/dL — ABNORMAL HIGH (ref 6–20)
CO2: 29 mmol/L (ref 22–32)
Calcium: 9.4 mg/dL (ref 8.9–10.3)
Chloride: 105 mmol/L (ref 101–111)
Creatinine, Ser: 1.22 mg/dL (ref 0.61–1.24)
GFR calc non Af Amer: >60 mL/min (ref >60)
Glucose, Bld: 84 mg/dL (ref 65–99)
POTASSIUM: 4.2 mmol/L (ref 3.5–5.1)
Sodium: 141 mmol/L (ref 135–145)

## 2017-10-26 LAB — URINALYSIS, COMPLETE (UACMP) WITH MICROSCOPIC
BACTERIA UA: NONE SEEN
Bilirubin Urine: NEGATIVE
Glucose, UA: >=500 mg/dL — AB
Hgb urine dipstick: NEGATIVE
Ketones, ur: 5 mg/dL — AB
Leukocytes, UA: NEGATIVE
Nitrite: NEGATIVE
PROTEIN: NEGATIVE mg/dL
SPECIFIC GRAVITY, URINE: 1.02 (ref 1.005–1.030)
SQUAMOUS EPITHELIAL / LPF: NONE SEEN
pH: 9 — ABNORMAL HIGH (ref 5.0–8.0)

## 2017-10-26 LAB — CBC WITH DIFFERENTIAL/PLATELET
BASOS ABS: 0.1 10*3/uL (ref 0–0.1)
Basophils Relative: 1 %
Eosinophils Absolute: 0.2 10*3/uL (ref 0–0.7)
Eosinophils Relative: 2 %
HEMATOCRIT: 42.8 % (ref 40.0–52.0)
HEMOGLOBIN: 14.4 g/dL (ref 13.0–18.0)
LYMPHS PCT: 20 %
Lymphs Abs: 2.4 10*3/uL (ref 1.0–3.6)
MCH: 29.9 pg (ref 26.0–34.0)
MCHC: 33.8 g/dL (ref 32.0–36.0)
MCV: 88.6 fL (ref 80.0–100.0)
Monocytes Absolute: 0.9 10*3/uL (ref 0.2–1.0)
Monocytes Relative: 8 %
NEUTROS ABS: 8 10*3/uL — AB (ref 1.4–6.5)
NEUTROS PCT: 69 %
Platelets: 303 10*3/uL (ref 150–440)
RBC: 4.83 MIL/uL (ref 4.40–5.90)
RDW: 13.4 % (ref 11.5–14.5)
WBC: 11.7 10*3/uL — AB (ref 3.8–10.6)

## 2017-10-26 MED ORDER — TAMSULOSIN HCL 0.4 MG PO CAPS: 0.4000 mg | ORAL_CAPSULE | Freq: Every day | ORAL | 0 refills | Status: DC

## 2017-10-26 MED ORDER — OXYCODONE-ACETAMINOPHEN 5-325 MG PO TABS: 1.0000 | ORAL_TABLET | ORAL | 0 refills | Status: DC | PRN

## 2017-10-26 MED ORDER — IBUPROFEN 600 MG PO TABS
ORAL_TABLET | ORAL | Status: AC
  Administered 2017-10-26: 600 mg via ORAL
  Filled 2017-10-26: qty 1

## 2017-10-26 MED ORDER — IBUPROFEN 400 MG PO TABS
600.0000 mg | ORAL_TABLET | Freq: Once | ORAL | Status: AC
  Administered 2017-10-26: 600 mg via ORAL

## 2017-10-26 MED ORDER — ONDANSETRON HCL 4 MG PO TABS: 4.0000 mg | ORAL_TABLET | Freq: Every day | ORAL | 0 refills | Status: DC | PRN

## 2017-10-26 NOTE — Discharge Instructions (Signed)
If you have high fever, vomiting, pain that is not controlled by medications or other concerns return to the emergency room.  Follow-up closely.  You have a 4 mm stone that should pass.  We do advised to call urology however in case it does not.  Do not drink or drive while taking Percocet.  Avoid taking Percocet unless is absolutely necessary.

## 2017-10-26 NOTE — ED Triage Notes (Signed)
Pt reports that Tuesday he developed left flank pain, it has increasingly got worse with pain. Denies any hematuria. He was able to urinate this am but has not been able to since.

## 2017-10-26 NOTE — ED Provider Notes (Addendum)
[facility] Emergency Department Provider Note  ____________________________________________   I have reviewed the triage vital signs and the nursing notes. Where available I have reviewed prior notes and, if possible and indicated, outside hospital notes.    HISTORY  Chief Complaint Flank Pain    HPI [name] RODRIGUE is a 57 y.o. male who states he does have a history of kidney stones in the past, CHF, DJD, hypertension, obesity, colon polyps, OSA, states he been having left flank pain for since yesterday morning waxing and waning at this time is gone.  Sometimes radiates towards his groin.  Feels it is been difficult to make urine but did make urine in triage and feels better.  Has no pain at this moment.  Pain is sharp.  Feels like prior kidney stones.  Denies any fever or chills.  Did have nausea but no significant vomiting.  Denies any change in his stooling or dysuria urinary frequency.  Blood sugars been running in the 150s he states.  This is slightly high for him.  Most of the pain is in the left flank, does radiate towards the groin sometimes, it is sharp, comes and goes on its own, no other alleviating or aggravating factors, no other prior treatment states he is pain-free at this moment   Past Medical History:  Diagnosis Date  . Allergic rhinitis, cause unspecified   . CHF (congestive heart failure) (HCC)   . Degeneration of intervertebral disc, site unspecified   . Diabetes mellitus   . Hyperlipidemia   . Hypertension   . Impotence of organic origin   . Obesity, unspecified   . Obstructive sleep apnea (adult) (pediatric)   . Other chronic allergic conjunctivitis   . Personal history of colonic polyps     Patient Active Problem List   Diagnosis Date Noted  . CHF with left ventricular diastolic dysfunction, NYHA class 1 (HCC) 06/07/2014  . Chronic diastolic CHF (congestive heart failure) (HCC) 04/17/2014  . CAD (coronary artery disease) 04/17/2014   . Abnormal finding on cardiovascular stress test 03/20/2014  . Sleep apnea 03/20/2014  . DOE (dyspnea on exertion) 03/20/2014  . Family history of coronary artery disease in father 03/20/2014  . Hearing loss 09/03/2012  . Generalized anxiety disorder 09/03/2012  . Poorly controlled type 2 diabetes mellitus with circulatory disorder (HCC) 06/05/2012  . History of laparoscopic adjustable gastric banding 07/28/2011  . Dyslipidemia 11/30/2007  . Obesity 11/30/2007  . HTN (hypertension) 11/30/2007  . GASTROPARESIS 11/30/2007    Past Surgical History:  Procedure Laterality Date  . admission  01/2009   [facility].  Syncopal event with nausea, diaphoresis, hypertension.  D-dimer elevated at 2.54 with negative CT chest. and VQ scan.  Cardiac enzymes negative x 3.  Labs normal.  Cardiology consult---cardiolite abn; cath, 2D-echo, holter monitor unrevealing.  SE H&V.  Marland Kitchen Allergy Consult  07/11/2010   SOB, itching, facial tingling, hypotension.  Allergy testing: no food allergies; +reaction to tree pollen, grass pollen, dust mites, mold.  Avoid diclofenac and similar products.  [name].  . APPENDECTOMY    . arthroscopy of knee  1980  . BACK SURGERY     x3  . COLONOSCOPY  07/11/2009   single polyp; repeat in 5 years.  [name].  . GI consult  07/11/2010   CT chest in ED (gastric thickening).  [name].  No EGD warranted due to lack of symptoms.  Marland Kitchen LAPAROSCOPIC GASTRIC BANDING  09/15/09  . LEFT HEART CATHETERIZATION WITH CORONARY ANGIOGRAM N/A  04/02/2014   Procedure: LEFT HEART CATHETERIZATION WITH CORONARY ANGIOGRAM;  Surgeon: Lesleigh Noe, MD;  Location: [facility] CATH LAB;  Service: Cardiovascular;  Laterality: N/A;  . QUADRICEPS REPAIR     left  . QUADRICEPS TENDON REPAIR     left  . ROTATOR CUFF REPAIR     right  . Sleep study  07/11/2010   repeat sleep study due to weight loss.  Severe OSA; CPAP titration to 8 cm.    . SPINE SURGERY    . TONSILLECTOMY AND ADENOIDECTOMY  1960    Prior to Admission  medications   Medication Sig Start Date End Date Taking? Authorizing Provider  albuterol (PROVENTIL HFA;VENTOLIN HFA) 108 (90 Base) MCG/ACT inhaler Inhale 1-2 puffs into the lungs every 6 (six) hours as needed for wheezing or shortness of breath. 10/11/16   Joaquim Nam, MD  aspirin EC 81 MG tablet Take 81 mg by mouth daily.    [provider]  atorvastatin (LIPITOR) 40 MG tablet Take 1 tablet (40 mg total) by mouth daily. 09/07/17   Joaquim Nam, MD  B-D INS SYR ULTRAFINE 1CC/31G 31G X 5/16" 1 ML MISC USE FOR INSULIN INJECTIONS THREE TIMES DAILY 05/06/14   Elvina Sidle, MD  Calcium-Magnesium-Vitamin D (CALCIUM 500 PO) Take 500 mg by mouth 3 (three) times daily.    [provider]  cyclobenzaprine (FLEXERIL) 5 MG tablet TAKE ONE TABLET BY MOUTH EVERY NIGHT AT BEDTIME 06/22/17   Joaquim Nam, MD  Dulaglutide (TRULICITY) 1.5 MG/0.5ML SOPN INJECT 1.5MG  INTO THE SKIN ONCE WEEKLY IN AM 09/27/17   Carlus Pavlov, MD  empagliflozin (JARDIANCE) 25 MG TABS tablet Take 25 mg by mouth daily. 09/27/17   Carlus Pavlov, MD  famotidine (PEPCID) 20 MG tablet TAKE ONE (1) TABLET BY MOUTH EVERY NIGHTAT BEDTIME 12/10/14   Nyoka Cowden, MD  FLUoxetine (PROZAC) 20 MG tablet TAKE 1 TABLET BY MOUTH DAILY 06/28/17   Joaquim Nam, MD  glucose blood (ONE TOUCH ULTRA TEST) test strip Test blood sugar 3 times daily. Dx code: E11.9. Insulin treated 05/11/16   Joaquim Nam, MD  insulin aspart (NOVOLOG FLEXPEN) 100 UNIT/ML FlexPen Inject 20-25 Units into the skin 3 (three) times daily with meals. 09/27/17   Carlus Pavlov, MD  Insulin Detemir (LEVEMIR FLEXTOUCH) 100 UNIT/ML Pen INJECT 60 UNITS INTO THE SKIN DAILY AT 10:00PM 09/27/17   Carlus Pavlov, MD  metFORMIN (GLUCOPHAGE) 1000 MG tablet Take 1 tablet (1,000 mg total) by mouth 2 (two) times daily with a meal. 09/27/17   Carlus Pavlov, MD  metoprolol tartrate (LOPRESSOR) 25 MG tablet Take 0.5 tablets (12.5 mg total) by mouth 2  (two) times daily. 04/25/17 07/24/17  Allayne Butcher, PA-C  Multiple Vitamin (MULTIVITAMIN) capsule Take 1 capsule by mouth daily.    [provider]  omeprazole (PRILOSEC) 40 MG capsule Take 40 mg by mouth daily as needed (acid reflux).     [provider]  spironolactone (ALDACTONE) 25 MG tablet Take 0.5 tablets (12.5 mg total) by mouth daily. 09/11/17   Joaquim Nam, MD    Allergies Diclofenac sodium and Voltaren [diclofenac sodium]  Family History  Problem Relation Age of Onset  . Cancer Father        skin  . Heart disease Father 100       AMI first age 54; stents.  . Diabetes Father   . Hypertension Father   . Hyperlipidemia Father   . Peripheral Artery Disease Father   .  Parkinson's disease Father   . Cancer Paternal Grandfather        skin  . Diabetes Mother   . Arthritis Mother        total hip replacement  . Kidney failure Mother   . Depression Sister   . Diabetes Sister   . Cancer Maternal Uncle        lymphoma  . Prostate cancer Maternal Uncle   . Colon cancer Neg Hx     Social History Social History   Tobacco Use  . Smoking status: Former Smoker    Packs/day: 0.25    Years: 2.00    Pack years: 0.50    Types: Cigarettes  . Smokeless tobacco: Never Used  Substance Use Topics  . Alcohol use: Yes    Alcohol/week: 0.0 oz    Comment: occasional Beer once every two weeks on the weekends  . Drug use: No    Review of Systems Constitutional: No fever/chills Eyes: No visual changes. ENT: No sore throat. No stiff neck no neck pain Cardiovascular: Denies chest pain. Respiratory: Denies shortness of breath. Gastrointestinal:   no vomiting.  No diarrhea.  No constipation. Genitourinary: Negative for dysuria. Musculoskeletal: Negative lower extremity swelling Skin: Negative for rash. Neurological: Negative for severe headaches, focal weakness or numbness.   ____________________________________________   PHYSICAL EXAM:  VITAL  SIGNS: ED Triage Vitals  Enc Vitals Group     BP 10/26/17 1128 (!) 169/83     Pulse Rate 10/26/17 1128 98     Resp 10/26/17 1128 20     Temp 10/26/17 1128 98 F (36.7 C)     Temp Source 10/26/17 1128 Oral     SpO2 10/26/17 1128 100 %     Weight 10/26/17 1129 264 lb (119.7 kg)     Height 10/26/17 1129 6\' 1"  (1.854 m)     Head Circumference --      Peak Flow --      Pain Score 10/26/17 1129 7     Pain Loc --      Pain Edu? --      Excl. in GC? --     Constitutional: Alert and oriented. Well appearing and in no acute distress. Eyes: Conjunctivae are normal Head: Atraumatic HEENT: No congestion/rhinnorhea. Mucous membranes are moist.  Oropharynx non-erythematous Neck:   Nontender with no meningismus, no masses, no stridor Cardiovascular: Normal rate, regular rhythm. Grossly normal heart sounds.  Good peripheral circulation. Respiratory: Normal respiratory effort.  No retractions. Lungs CTAB. Abdominal: Soft and nontender. No distention. No guarding no rebound Back:  There is no focal tenderness or step off.  there is no midline tenderness there are no lesions noted. there is no CVA tenderness Normal external genitalia, no lesions masses or tenderness Musculoskeletal: No lower extremity tenderness, no upper extremity tenderness. No joint effusions, no DVT signs strong distal pulses no edema Neurologic:  Normal speech and language. No gross focal neurologic deficits are appreciated.  Skin:  Skin is warm, dry and intact. No rash noted. Psychiatric: Mood and affect are normal. Speech and behavior are normal.  ____________________________________________   LABS (all labs ordered are listed, but only abnormal results are displayed)  Labs Reviewed  URINALYSIS, COMPLETE (UACMP) WITH MICROSCOPIC - Abnormal; Notable for the following components:      Result Value   Color, Urine YELLOW (*)    APPearance CLEAR (*)    pH 9.0 (*)    Glucose, UA >=500 (*)    Ketones, ur 5 (*)  All  other components within normal limits  BASIC METABOLIC PANEL  CBC WITH DIFFERENTIAL/PLATELET    Pertinent labs  results that were available during my care of the patient were reviewed by me and considered in my medical decision making (see chart for details). ____________________________________________  EKG  I personally interpreted any EKGs ordered by me or triage  ____________________________________________  RADIOLOGY  Pertinent labs & imaging results that were available during my care of the patient were reviewed by me and considered in my medical decision making (see chart for details). If possible, patient and/or family made aware of any abnormal findings.  No results found. ____________________________________________    PROCEDURES  Procedure(s) performed: None  Procedures  Critical Care performed: None  ____________________________________________   INITIAL IMPRESSION / ASSESSMENT AND PLAN / ED COURSE  Pertinent labs & imaging results that were available during my care of the patient were reviewed by me and considered in my medical decision making (see chart for details).  Vision with recurrent sharp significant discomfort in his left flank with vomiting, no pain or tenderness at this time that I can elicit, differential includes muscle pain, AAA, or most likely kidney stone.  Given that there is no blood in his urine, he is 56 and not in the best of health I will obtain a CT scan to ensure that but we think we are looking at is what we are looking at.  We will also get kidney function of his of blood work.  Patient declines pain medication at this time he has no tenderness.  Does not recall passing a stone however.  ----------------------------------------- 3:49 PM on 10/26/2017 -----------------------------------------  Action reassuring, blood work reassuring glucose reassuring, urinalysis reassuring exam reassuring he does not have pain at this time.  He has  had allergic reactions to diclofenac but never had a problem with Motrin and takes it on a regular basis we will give him Motrin here to ensure that his pain does not recur while he is going home.  He does have a driver with him however.  In addition, we will send him home with usual medications and follow-up.  Is a 4 mm stone no other comp occasions noted patient made aware of all CT findings which I personally reviewed films.    ____________________________________________   FINAL CLINICAL IMPRESSION(S) / ED DIAGNOSES  Final diagnoses:  None      This chart was dictated using voice recognition software.  Despite best efforts to proofread,  errors can occur which can change meaning.      Jeanmarie Plant, MD 10/26/17 1433    Jeanmarie Plant, MD 10/26/17 1550    Jeanmarie Plant, MD 10/26/17 1550

## 2017-10-26 NOTE — ED Notes (Signed)
Patient transported to CT 

## 2017-12-04 ENCOUNTER — Other Ambulatory Visit: Payer: Self-pay | Admitting: Family Medicine

## 2017-12-04 ENCOUNTER — Encounter: Payer: Self-pay | Admitting: Family Medicine

## 2017-12-04 DIAGNOSIS — E785 Hyperlipidemia, unspecified: Secondary | ICD-10-CM

## 2017-12-08 ENCOUNTER — Other Ambulatory Visit: Payer: Self-pay | Admitting: Family Medicine

## 2017-12-08 DIAGNOSIS — I5032 Chronic diastolic (congestive) heart failure: Secondary | ICD-10-CM

## 2017-12-29 ENCOUNTER — Ambulatory Visit: Payer: BC Managed Care – PPO | Admitting: Internal Medicine

## 2017-12-29 NOTE — Telephone Encounter (Signed)
This was a note from 5 months ago.  It was routed back to me today.  What is the status with this message?

## 2018-01-03 ENCOUNTER — Other Ambulatory Visit: Payer: Self-pay | Admitting: Family Medicine

## 2018-01-03 NOTE — Telephone Encounter (Signed)
Electronic refill request Last refill 06/22/17 #30/1 Last office visit 03/07/17

## 2018-01-04 ENCOUNTER — Encounter: Payer: Self-pay | Admitting: *Deleted

## 2018-01-04 NOTE — Telephone Encounter (Signed)
Letter mailed

## 2018-01-04 NOTE — Telephone Encounter (Signed)
Sent. Thanks.  Due for CPE. 

## 2018-01-18 ENCOUNTER — Other Ambulatory Visit (INDEPENDENT_AMBULATORY_CARE_PROVIDER_SITE_OTHER): Payer: BC Managed Care – PPO

## 2018-01-18 ENCOUNTER — Other Ambulatory Visit: Payer: Self-pay | Admitting: Family Medicine

## 2018-01-18 DIAGNOSIS — E1159 Type 2 diabetes mellitus with other circulatory complications: Secondary | ICD-10-CM | POA: Diagnosis not present

## 2018-01-18 DIAGNOSIS — E1165 Type 2 diabetes mellitus with hyperglycemia: Secondary | ICD-10-CM

## 2018-01-18 LAB — LIPID PANEL
CHOL/HDL RATIO: 3
Cholesterol: 138 mg/dL (ref 0–200)
HDL: 42 mg/dL (ref >39.00)
LDL CALC: 75 mg/dL (ref 0–99)
NONHDL: 95.55
TRIGLYCERIDES: 104 mg/dL (ref 0.0–149.0)
VLDL: 20.8 mg/dL (ref 0.0–40.0)

## 2018-01-18 LAB — MICROALBUMIN / CREATININE URINE RATIO
CREATININE, U: 151.8 mg/dL
MICROALB/CREAT RATIO: 0.5 mg/g (ref 0.0–30.0)
Microalb, Ur: 0.8 mg/dL (ref 0.0–1.9)

## 2018-01-18 LAB — COMPREHENSIVE METABOLIC PANEL
ALT: 12 U/L (ref 0–53)
AST: 14 U/L (ref 0–37)
Albumin: 3.9 g/dL (ref 3.5–5.2)
Alkaline Phosphatase: 84 U/L (ref 39–117)
BILIRUBIN TOTAL: 0.5 mg/dL (ref 0.2–1.2)
BUN: 18 mg/dL (ref 6–23)
CALCIUM: 9.3 mg/dL (ref 8.4–10.5)
CHLORIDE: 104 meq/L (ref 96–112)
CO2: 30 meq/L (ref 19–32)
Creatinine, Ser: 1.01 mg/dL (ref 0.40–1.50)
GFR: 80.43 mL/min (ref >60.00)
GLUCOSE: 112 mg/dL — AB (ref 70–99)
Potassium: 4.7 mEq/L (ref 3.5–5.1)
Sodium: 141 mEq/L (ref 135–145)
Total Protein: 7.3 g/dL (ref 6.0–8.3)

## 2018-01-18 LAB — HEMOGLOBIN A1C: Hgb A1c MFr Bld: 10 % — ABNORMAL HIGH (ref 4.6–6.5)

## 2018-01-22 ENCOUNTER — Encounter: Payer: Self-pay | Admitting: Family Medicine

## 2018-01-22 ENCOUNTER — Ambulatory Visit (INDEPENDENT_AMBULATORY_CARE_PROVIDER_SITE_OTHER): Payer: BC Managed Care – PPO | Admitting: Family Medicine

## 2018-01-22 VITALS — BP 130/74 | HR 101 | Temp 98.8°F | Ht 73.0 in | Wt 262.5 lb

## 2018-01-22 DIAGNOSIS — I5032 Chronic diastolic (congestive) heart failure: Secondary | ICD-10-CM

## 2018-01-22 DIAGNOSIS — Z125 Encounter for screening for malignant neoplasm of prostate: Secondary | ICD-10-CM | POA: Diagnosis not present

## 2018-01-22 DIAGNOSIS — Z7189 Other specified counseling: Secondary | ICD-10-CM

## 2018-01-22 DIAGNOSIS — E785 Hyperlipidemia, unspecified: Secondary | ICD-10-CM | POA: Diagnosis not present

## 2018-01-22 DIAGNOSIS — Z0001 Encounter for general adult medical examination with abnormal findings: Secondary | ICD-10-CM

## 2018-01-22 DIAGNOSIS — R454 Irritability and anger: Secondary | ICD-10-CM | POA: Diagnosis not present

## 2018-01-22 DIAGNOSIS — E1165 Type 2 diabetes mellitus with hyperglycemia: Secondary | ICD-10-CM

## 2018-01-22 DIAGNOSIS — E1159 Type 2 diabetes mellitus with other circulatory complications: Secondary | ICD-10-CM

## 2018-01-22 MED ORDER — GLUCOSE BLOOD VI STRP: ORAL_STRIP | 3 refills | Status: DC

## 2018-01-22 MED ORDER — ATORVASTATIN CALCIUM 40 MG PO TABS: 40.0000 mg | ORAL_TABLET | Freq: Every day | ORAL | 3 refills | Status: DC

## 2018-01-22 NOTE — Progress Notes (Signed)
CPE- See plan.  Routine anticipatory guidance given to patient.  See health maintenance.  The possibility exists that previously documented standard health maintenance information may have been brought forward from a previous encounter into this note.  If needed, that same information has been updated to reflect the current situation based on today's encounter.    Tetanus 2012 Flu 2018 PNA up to date  Shingles out of stock.   PSA screening d/w pt.  FH noted.   Colonoscopy prev done.   Living will d/w pt.  Wife designated if patient were incapacitated.    [place] in [place] and he has been caring for her.  D/w pt.    Diabetes:  Using medications without difficulties:yes Hypoglycemic episodes: lowest was down to 65-70, routine cautions d/w pt.   Hyperglycemic episodes: max ~200 Feet problems: some tingling in the feet, more at night (affected by prev back pathology/surgery) Blood Sugars averaging: 124 this AM.   eye exam within last year: due, d/w pt.   He has been checking his sugar more and has been more compliant with mealtime insulin, etc.  A1c improved and he has sig sugar improvement in the last few weeks to months.    Elevated Cholesterol: Using medications without problems:yes Muscle aches: not from statin but he has some leg and back aches at the end of the day Diet compliance: yes, improved Exercise: encouraged, he is working more than exercising.   Intentional weight loss.    Mood d/w pt.  He was more irritable prior to SSRI use.  It helps.  No ADE on med.  He wanted to continue.  No SI/HI.    CHF  Using medication without problems or lightheadedness: yes Chest pain with exertion: no Edema:no Short of breath: improved, see below.   He quit metoprolol and he felt better.  He still isn't able to easily tolerate heat exposure in the middle of the day but o/w has been feeling better.    PMH and SH reviewed  Meds, vitals, and allergies reviewed.    ROS: Per HPI.  Unless specifically indicated otherwise in HPI, the patient denies:  General: fever. Eyes: acute vision changes ENT: sore throat Cardiovascular: chest pain Respiratory: SOB GI: vomiting GU: dysuria Musculoskeletal: acute back pain Derm: acute rash Neuro: acute motor dysfunction Psych: worsening mood Endocrine: polydipsia Heme: bleeding Allergy: hayfever  GEN: nad, alert and oriented HEENT: mucous membranes moist NECK: supple w/o LA CV: rrr. PULM: ctab, no inc wob ABD: soft, +bs EXT: no edema SKIN: no acute rash but vertical abrasion on R skin (scraped his leg with ax a few days ago- it didn't penetrate the pants leg)  Diabetic foot exam: Normal inspection No skin breakdown No calluses  Normal DP pulses Normal sensation to light touch and monofilament Nails normal except B 1st nails absent

## 2018-01-22 NOTE — Patient Instructions (Addendum)
Call for a diabetic eye exam ([facility] [phone]).   I'll update Dr. Elvera LennoxGherghe.   Go to the lab on the way out.  We'll contact you with your lab report. Don't change your meds for now.  Take care.  Glad to see you.

## 2018-01-23 LAB — PSA: PSA: 0.28 ng/mL (ref 0.10–4.00)

## 2018-01-24 ENCOUNTER — Encounter: Payer: Self-pay | Admitting: Family Medicine

## 2018-01-24 DIAGNOSIS — E785 Hyperlipidemia, unspecified: Secondary | ICD-10-CM | POA: Insufficient documentation

## 2018-01-24 DIAGNOSIS — Z7189 Other specified counseling: Secondary | ICD-10-CM | POA: Insufficient documentation

## 2018-01-24 DIAGNOSIS — Z0001 Encounter for general adult medical examination with abnormal findings: Secondary | ICD-10-CM | POA: Insufficient documentation

## 2018-01-24 NOTE — Assessment & Plan Note (Signed)
Living will d/w pt.  Wife designated if patient were incapacitated.   ?

## 2018-01-24 NOTE — Assessment & Plan Note (Signed)
Labs discussed with patient.  Continue work on weight loss.  Continue statin.  He agrees.

## 2018-01-24 NOTE — Assessment & Plan Note (Signed)
Tetanus 2012 Flu 2018 PNA up to date  Shingles out of stock.   PSA screening d/w pt.  FH noted.   Colonoscopy prev done.   Living will d/w pt.  Wife designated if patient were incapacitated.

## 2018-01-24 NOTE — Assessment & Plan Note (Signed)
He has been checking his sugar more and has been more compliant with mealtime insulin, etc.  A1c improved and he has sig sugar improvement in the last few weeks to months.   Discussed with patient about labs.  I went ahead and got his labs done so he could have everything collected at once.  I will update endocrinology.  I did not change his medications at this point as I expect his A1c to hopefully continue with his current interventions.  I appreciate the help from endocrinology. D/w pt.  He agrees.

## 2018-01-24 NOTE — Assessment & Plan Note (Signed)
Much improved with SSRI use.  Medication had helped.  No adverse effect and he wanted to continue.  Continue as is.  He agrees.

## 2018-01-24 NOTE — Assessment & Plan Note (Signed)
He felt better off of metoprolol.  He is able to tolerate current medications as is.  Labs discussed with patient.  Continue work on diet and exercise.  No change in meds at this point.  He agrees.

## 2018-01-25 ENCOUNTER — Other Ambulatory Visit: Payer: BC Managed Care – PPO

## 2018-01-28 ENCOUNTER — Telehealth: Payer: Self-pay | Admitting: Family Medicine

## 2018-01-28 NOTE — Telephone Encounter (Signed)
-----   Message from Annamarie MajorLugene S Fuquay, [place] sent at 01/25/2018  8:53 AM EDT ----- January 2021 ----- Message ----- From: Joaquim Namuncan, Leshonda Galambos S, MD Sent: 01/24/2018  12:32 PM To: Annamarie MajorLugene S Fuquay, CMA  Please [facility] with [facility] to see when he is due for follow-up colonoscopy.  Please let me and the patient know.  Thanks.  Clelia CroftShaw

## 2018-01-28 NOTE — Telephone Encounter (Signed)
Thank you! Is patient aware?

## 2018-01-29 NOTE — Telephone Encounter (Signed)
Patient advised.

## 2018-02-04 ENCOUNTER — Other Ambulatory Visit: Payer: Self-pay | Admitting: Family Medicine

## 2018-03-01 ENCOUNTER — Ambulatory Visit: Payer: BC Managed Care – PPO | Admitting: Cardiovascular Disease

## 2018-03-02 ENCOUNTER — Encounter: Payer: Self-pay | Admitting: Internal Medicine

## 2018-03-02 ENCOUNTER — Ambulatory Visit: Payer: BC Managed Care – PPO | Admitting: Internal Medicine

## 2018-03-02 VITALS — BP 138/74 | HR 87 | Ht 73.0 in | Wt 265.0 lb

## 2018-03-02 DIAGNOSIS — E669 Obesity, unspecified: Secondary | ICD-10-CM

## 2018-03-02 DIAGNOSIS — E785 Hyperlipidemia, unspecified: Secondary | ICD-10-CM

## 2018-03-02 DIAGNOSIS — E1159 Type 2 diabetes mellitus with other circulatory complications: Secondary | ICD-10-CM | POA: Diagnosis not present

## 2018-03-02 DIAGNOSIS — E1165 Type 2 diabetes mellitus with hyperglycemia: Secondary | ICD-10-CM

## 2018-03-02 DIAGNOSIS — Z6833 Body mass index (BMI) 33.0-33.9, adult: Secondary | ICD-10-CM

## 2018-03-02 NOTE — Patient Instructions (Signed)
Please continue: - Metformin 1000 mg 2X a day with meals - Jardiance 25 mg before breakfast - Trulicity 1.5 mg weekly in a.m. - NovoLog 20 to 25 units 3X a day, before each meal. Try not to forget these!  Please move Levemir 60 units to am.  Please return in 3 months with your sugar log.

## 2018-03-02 NOTE — Progress Notes (Signed)
Patient ID: [name], male   DOB: 1960/11/25, 57 y.o.   MRN: [id number]   HPI: [name] is a 57 y.o.-year-old male, returning for f/u for DM2, dx in 1993, insulin-dependent, uncontrolled since ~2005, with complications (CAD, CHF, gastroparesis, ED). Last visit 5 months ago.  Since last visit, he was busy caring for his wife who has health problems and they also moved in the new home.  However, he feels that his sugars improved.  He not forgetting the doses of NovoLog as much as before  Last hemoglobin A1c was: Lab Results  Component Value Date   HGBA1C 10.0 (H) 01/18/2018   HGBA1C 11.6 (H) 09/27/2017   HGBA1C 11.6 06/29/2017  03/07/2017: HbA1c calculated from the fructosamine 10%  Pt is on a regimen of: - Metformin 1000 mg 2X a day with meals - Jardiance 25 mg before breakfast - Trulicity 1.5 mg weekly in a.m. - Levemir 60 units at bedtime - NovoLog 20 to 25 units 3X a day, before each meal He also tried other oral meds >> cannot remember name.  Pt checks his sugars 1-2 times a day - am: 111-167, 200-222 >> 60, 75-135, 200 >> 71-135, 151, 165 - 2h after b'fast:98-203 >> 143-181 >> 143-258 - before lunch: 63, 102-198, 277, 294  >> 145-206 >> 145-238 - 2h after lunch: n/c >> 3-5 (meds late) >> n/c >> 352 - before dinner: 252 >> 205-358 >> 259-313 - 2h after dinner:  300-412 >> 248 >> n/c >> 95 - bedtime: 201 >> 210, 244, 278 >> 210-278 - nighttime: n/c >> 68-82 >> n/c Lowest sugar was 63 >>  60 >> 60 at 3 am; he has hypoglycemia awareness in the 90s. Highest sugar was 305 >> 358 >> 316 (sick, no insulin).  Glucometer: One Touch Ultra   Pt's meals are: - Breakfast (usu. 30 units): sausage, 1 egg, bacon, rarely bisquit; cereals - Lunch (usu. 20 units): soup, sandwich - when working: fast food  - Dinner (usu. 30 units): meat/fish + vegetable, including sweet potatoes + occasionally: starch - Snacks: no dessert usually, cookies, crackers; diet soda, occasionally regular  sodas, occasionally milk  He has a history of lap band surgery in 2012.  + CKD, last BUN/creatinine:  Lab Results  Component Value Date   BUN 18 01/18/2018   BUN 22 (H) 10/26/2017   CREATININE 1.01 01/18/2018   CREATININE 1.22 10/26/2017  Off Losartan, Valsartan >> SOB, sweating, dizziness.  Now on metoprolol -+ HL; last set of lipids: Lab Results  Component Value Date   CHOL 138 01/18/2018   HDL 42.00 01/18/2018   LDLCALC 75 01/18/2018   TRIG 104.0 01/18/2018   CHOLHDL 3 01/18/2018  On atorvastatin. - last eye exam was in 2016: No DR - He has numbness in his legs after back surgery. Had NCT. On ASA 81.  He had RMSF in 2012.  ROS: Constitutional: no weight gain/no weight loss, no fatigue, no subjective hyperthermia, no subjective hypothermia Eyes: no blurry vision, no xerophthalmia ENT: no sore throat, no nodules palpated in throat, no dysphagia, no odynophagia, no hoarseness Cardiovascular: no CP/no SOB/no palpitations/no leg swelling Respiratory: no cough/no SOB/no wheezing Gastrointestinal: no N/no V/no D/no C/no acid reflux Musculoskeletal: no muscle aches/no joint aches Skin: no rashes, no hair loss Neurological: no tremors/+ numbness/+ tingling/no dizziness  I reviewed pt's medications, allergies, PMH, social hx, family hx, and changes were documented in the history of present illness. Otherwise, unchanged from my initial visit note.  Past Medical History:  Diagnosis Date  . Allergic rhinitis, cause unspecified   . CHF (congestive heart failure) (HCC)   . Degeneration of intervertebral disc, site unspecified   . Diabetes mellitus   . Hyperlipidemia   . Hypertension   . Impotence of organic origin   . Obesity, unspecified   . Obstructive sleep apnea (adult) (pediatric)   . Other chronic allergic conjunctivitis   . Personal history of colonic polyps    Past Surgical History:  Procedure Laterality Date  . admission  01/2009   [facility].  Syncopal event with  nausea, diaphoresis, hypertension.  D-dimer elevated at 2.54 with negative CT chest. and VQ scan.  Cardiac enzymes negative x 3.  Labs normal.  Cardiology consult---cardiolite abn; cath, 2D-echo, holter monitor unrevealing.  SE H&V.  Marland Kitchen. Allergy Consult  07/11/2010   SOB, itching, facial tingling, hypotension.  Allergy testing: no food allergies; +reaction to tree pollen, grass pollen, dust mites, mold.  Avoid diclofenac and similar products.  [name].  . APPENDECTOMY    . arthroscopy of knee  1980  . BACK SURGERY     x3  . COLONOSCOPY  07/11/2009   single polyp; repeat in 5 years.  [name].  . GI consult  07/11/2010   CT chest in ED (gastric thickening).  [name].  No EGD warranted due to lack of symptoms.  Marland Kitchen. LAPAROSCOPIC GASTRIC BANDING  09/15/09  . LEFT HEART CATHETERIZATION WITH CORONARY ANGIOGRAM N/A 04/02/2014   Procedure: LEFT HEART CATHETERIZATION WITH CORONARY ANGIOGRAM;  Surgeon: Lesleigh NoeHenry W Smith III, MD;  [facility];  Service: Cardiovascular;  Laterality: N/A;  . QUADRICEPS REPAIR     left  . QUADRICEPS TENDON REPAIR     left  . ROTATOR CUFF REPAIR     right  . Sleep study  07/11/2010   repeat sleep study due to weight loss.  Severe OSA; CPAP titration to 8 cm.    . SPINE SURGERY    . TONSILLECTOMY AND ADENOIDECTOMY  1960   Social History   Social History  . Marital status: Married    Spouse name: N/A  . Number of children: 3  . Years of education: N/A   Occupational History  . 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 Smoking status: Former Smoker, quit in 1984    Packs/day: 0.25    Years: 2.00    Types: Cigarettes  . Smokeless tobacco: Never Used  . Alcohol use 0.0 oz/week     Comment: occasional Beer once every two weeks on the weekends  . Drug use: No   Social History Narrative   Married 1986       Children: 3 children, 3 grandchildren; 2 great grandkids      Lives: with wife, dog.       Alcohol: beer twice per month on weekends.      Drugs: none      Exercise: walking three days per week.   Current Outpatient Medications on File Prior to Visit  Medication Sig Dispense Refill  . albuterol (PROVENTIL HFA;VENTOLIN HFA) 108 (90 Base) MCG/ACT inhaler Inhale 1-2 puffs into the lungs every 6 (six) hours as needed for wheezing or shortness of breath. 1 Inhaler 1  . aspirin EC 81 MG tablet Take 81 mg by mouth daily.    Marland Kitchen. atorvastatin (LIPITOR) 40 MG tablet Take 1 tablet (40 mg total) by mouth daily. 90  tablet 3  . B-D INS SYR ULTRAFINE 1CC/31G 31G X 5/16" 1 ML MISC USE FOR INSULIN INJECTIONS THREE TIMES DAILY 100 each 5  . Calcium-Magnesium-Vitamin D (CALCIUM 500 PO) Take 500 mg by mouth 3 (three) times daily.    . cyclobenzaprine (FLEXERIL) 5 MG tablet TAKE ONE TABLET BY MOUTH EVERY NIGHT AT BEDTIME 30 tablet 1  . Dulaglutide (TRULICITY) 1.5 MG/0.5ML SOPN INJECT 1.5MG  INTO THE SKIN ONCE WEEKLY IN AM 4 pen 11  . empagliflozin (JARDIANCE) 25 MG TABS tablet Take 25 mg by mouth daily. 30 tablet 11  . famotidine (PEPCID) 20 MG tablet TAKE ONE (1) TABLET BY MOUTH EVERY NIGHTAT BEDTIME 30 tablet 0  . FLUoxetine (PROZAC) 20 MG tablet TAKE 1 TABLET BY MOUTH DAILY 30 tablet 5  . glucose blood (ONE TOUCH ULTRA TEST) test strip Test blood sugar 3 times daily. Dx code: E11.9. Insulin treated 300 each 3  . insulin aspart (NOVOLOG FLEXPEN) 100 UNIT/ML FlexPen Inject 20-25 Units into the skin 3 (three) times daily with meals. 15 mL 11  . Insulin Detemir (LEVEMIR FLEXTOUCH) 100 UNIT/ML Pen INJECT 60 UNITS INTO THE SKIN DAILY AT 10:00PM 15 mL 11  . metFORMIN (GLUCOPHAGE) 1000 MG tablet Take 1 tablet (1,000 mg total) by mouth 2 (two) times daily with a meal. 60 tablet 11  . Multiple Vitamin (MULTIVITAMIN) capsule Take 1 capsule by mouth daily.    Marland Kitchen omeprazole (PRILOSEC) 40 MG capsule Take 40 mg by mouth daily as needed (acid reflux).     Marland Kitchen spironolactone (ALDACTONE) 25 MG tablet TAKE 0.5 TABLETS  (12.5 MG TOTAL) BY MOUTH DAILY. 45 tablet 0   No current facility-administered medications on file prior to visit.    Allergies  Allergen Reactions  . Diclofenac Sodium Shortness Of Breath, Itching and Other (See Comments)    Hypotension   . Voltaren [Diclofenac Sodium] Anaphylaxis  . Metoprolol     Lightheaded/dizzy/SOB.    . Ramipril     Lightheaded/dizzy/SOB.     Family History  Problem Relation Age of Onset  . Cancer Father        skin  . Heart disease Father 73       AMI first age 79; stents.  . Diabetes Father   . Hypertension Father   . Hyperlipidemia Father   . Peripheral Artery Disease Father   . Parkinson's disease Father   . Cancer Paternal Grandfather        skin  . Diabetes Mother   . Arthritis Mother        total hip replacement  . Kidney failure Mother   . Depression Sister   . Diabetes Sister   . Cancer Maternal Uncle        lymphoma  . Prostate cancer Maternal Uncle   . Colon cancer Neg Hx    PE: BP 138/74   Pulse 87   Ht 6\' 1"  (1.854 m)   Wt 265 lb (120.2 kg)   SpO2 98%   BMI 34.96 kg/m  Wt Readings from Last 3 Encounters:  03/02/18 265 lb (120.2 kg)  01/22/18 262 lb 8 oz (119.1 kg)  10/26/17 264 lb (119.7 kg)   Constitutional: Obese, in NAD Eyes: PERRLA, EOMI, no exophthalmos ENT: moist mucous membranes, no thyromegaly, no cervical lymphadenopathy Cardiovascular: RRR, No MRG Respiratory: CTA B Gastrointestinal: abdomen soft, NT, ND, BS+ Musculoskeletal: no deformities, strength intact in all 4 Skin: moist, warm, no rashes Neurological: no tremor with outstretched hands, DTR normal in all 4  ASSESSMENT: 1. DM2, insulin-dependent, uncontrolled, with complications - CAD - CHF - gastroparesis - ED  2. HL  3. Obesity class 1 BMI Classification:  < 18.5 underweight   18.5-24.9 normal weight   25.0-29.9 overweight   30.0-34.9 class I obesity   35.0-39.9 class II obesity   ? 40.0 class III obesity   PLAN:  1. Patient  with long-standing, uncontrolled, type 2 diabetes, on metformin, Jardiance, basal-bolus insulin regimen, and Trulicity.  At last visit, we decrease his Levemir dose slightly, since he was having some low CBGs in the morning.  Also, his sugars were still very high and they were increasing throughout the day as he was still missing many NovoLog doses.  I strongly advised him to try to take it before every meal. - At last visit, we checked a BMP after starting Jardiance.  His BUN/creatinine levels were higher, however, he had another set of kidney tests checked a month ago and this have improved - At this visit, sugars are slightly better, but they still increase in a stepwise fashion throughout the day.  He continues to try to improve his compliance with NovoLog. - His sugars are dropping in the middle of the night occasionally to the 60s.  Also, in the morning, they are the lowest of the day.  We discussed that we will probably need to reduce his Levemir dose, however, for now, since his sugars are high throughout the day, I advised him to move it to morning to both improve daytime sugars and to reduce nighttime hypoglycemia.  No changes are needed and the rest of his regimen. - I suggested to:  Patient Instructions  Please continue: - Metformin 1000 mg 2X a day with meals - Jardiance 25 mg before breakfast - Trulicity 1.5 mg weekly in a.m. - NovoLog 20 to 25 units 3X a day, before each meal. Try not to forget these!  Please move Levemir 60 units to am.  Please return in 3 months with your sugar log.  - He had an HbA1c 1.5 months ago and this was slightly better, at 10%, decreased from 11.6%, but still very high. - continue checking sugars at different times of the day - check 3x a day, rotating checks - advised for yearly eye exams >> he is not UTD - Return to clinic in 3 mo with sugar log    2. HL - Reviewed latest lipid panel from 01/2018: All fractions at goal Lab Results  Component Value  Date   CHOL 138 01/18/2018   HDL 42.00 01/18/2018   LDLCALC 75 01/18/2018   TRIG 104.0 01/18/2018   CHOLHDL 3 01/18/2018  - Continues atorvastatin without side effects.  3. Obesity class 1 -He previously lost 40 pounds due to glucose toxicity but his weight plateaued afterwards, with minimum changes -We will continue Trulicity and Jardiance, which should both help with weight loss   Carlus Pavlov, MD PhD [facility]

## 2018-03-13 ENCOUNTER — Other Ambulatory Visit: Payer: Self-pay | Admitting: Family Medicine

## 2018-03-13 DIAGNOSIS — I5032 Chronic diastolic (congestive) heart failure: Secondary | ICD-10-CM

## 2018-04-02 ENCOUNTER — Ambulatory Visit: Payer: BC Managed Care – PPO | Admitting: Cardiovascular Disease

## 2018-04-02 ENCOUNTER — Encounter: Payer: Self-pay | Admitting: Cardiovascular Disease

## 2018-04-02 ENCOUNTER — Encounter

## 2018-04-02 VITALS — BP 152/80 | HR 94 | Ht 73.0 in | Wt 262.0 lb

## 2018-04-02 DIAGNOSIS — I5032 Chronic diastolic (congestive) heart failure: Secondary | ICD-10-CM

## 2018-04-02 DIAGNOSIS — I2510 Atherosclerotic heart disease of native coronary artery without angina pectoris: Secondary | ICD-10-CM

## 2018-04-02 DIAGNOSIS — E785 Hyperlipidemia, unspecified: Secondary | ICD-10-CM

## 2018-04-02 DIAGNOSIS — I10 Essential (primary) hypertension: Secondary | ICD-10-CM | POA: Diagnosis not present

## 2018-04-02 NOTE — Progress Notes (Signed)
Chief Complaint  Patient presents with  . Follow-up    CAD   History of Present Illness: 57 yo male with a history of HTN, HLD, morbid obesity status post lap band surgery, diabetes mellitus, sleep apnea and CAD who is here today for follow up. I met him in June 2016. He was seen in the emergency room 03/01/14 with chest pain and dyspnea. Cardiac markers were normal. Stress myoview demonstrated apical defect (possible scar). Cardiac cath 2015 demonstrated mild coronary plaque but no obstructive disease. LVEDP was elevated and continued management of diastolic CHF was recommended. He was seen in our office August 2018 by Lyda Jester, PA with c/o chest pain and dyspnea. Echo 03/15/17 with normal LV systolic function, grade 1 diastolic dysfunction and no valve disease. Nuclear stress test September 2018 was low risk with no large areas of ischemia. He stopped his Losartan and his symptoms resolved.   He is here today for follow up. The patient denies any chest pain, dyspnea, palpitations, lower extremity edema, orthopnea, PND, dizziness, near syncope or syncope. Blood pressure is normal at home.   He is a Soil scientist.   Primary Care Physician: Tonia Ghent, MD   Past Medical History:  Diagnosis Date  . Allergic rhinitis, cause unspecified   . CHF (congestive heart failure) ([place])   . Degeneration of intervertebral disc, site unspecified   . Diabetes mellitus   . Hyperlipidemia   . Hypertension   . Impotence of organic origin   . Obesity, unspecified   . Obstructive sleep apnea (adult) (pediatric)   . Other chronic allergic conjunctivitis   . Personal history of colonic polyps     Past Surgical History:  Procedure Laterality Date  . admission  01/2009   [facility].  Syncopal event with nausea, diaphoresis, hypertension.  D-dimer elevated at 2.54 with negative CT chest. and VQ scan.  Cardiac enzymes negative x 3.  Labs normal.  Cardiology consult---cardiolite abn;  cath, 2D-echo, holter monitor unrevealing.  SE H&V.  Marland Kitchen Allergy Consult  07/11/2010   SOB, itching, facial tingling, hypotension.  Allergy testing: no food allergies; +reaction to tree pollen, grass pollen, dust mites, mold.  Avoid diclofenac and similar products.  [name].  . APPENDECTOMY    . arthroscopy of knee  1980  . BACK SURGERY     x3  . COLONOSCOPY  07/11/2009   single polyp; repeat in 5 years.  [name].  . GI consult  07/11/2010   CT chest in ED (gastric thickening).  [name].  No EGD warranted due to lack of symptoms.  Marland Kitchen LAPAROSCOPIC GASTRIC BANDING  09/15/09  . LEFT HEART CATHETERIZATION WITH CORONARY ANGIOGRAM N/A 04/02/2014   Procedure: LEFT HEART CATHETERIZATION WITH CORONARY ANGIOGRAM;  Surgeon: [name], MD;  Location: [facility] CATH LAB;  Service: Cardiovascular;  Laterality: N/A;  . QUADRICEPS REPAIR     left  . QUADRICEPS TENDON REPAIR     left  . ROTATOR CUFF REPAIR     right  . Sleep study  07/11/2010   repeat sleep study due to weight loss.  Severe OSA; CPAP titration to 8 cm.    . SPINE SURGERY    . TONSILLECTOMY AND ADENOIDECTOMY  1960    Current Outpatient Medications  Medication Sig Dispense Refill  . albuterol (PROVENTIL HFA;VENTOLIN HFA) 108 (90 Base) MCG/ACT inhaler Inhale 1-2 puffs into the lungs every 6 (six) hours as needed for wheezing or shortness of breath. 1 Inhaler 1  .  aspirin EC 81 MG tablet Take 81 mg by mouth daily.    Marland Kitchen atorvastatin (LIPITOR) 40 MG tablet Take 1 tablet (40 mg total) by mouth daily. 90 tablet 3  . B-D INS SYR ULTRAFINE 1CC/31G 31G X 5/16" 1 ML MISC USE FOR INSULIN INJECTIONS THREE TIMES DAILY 100 each 5  . Calcium-Magnesium-Vitamin D (CALCIUM 500 PO) Take 500 mg by mouth 3 (three) times daily.    . cyclobenzaprine (FLEXERIL) 5 MG tablet TAKE ONE TABLET BY MOUTH EVERY NIGHT AT BEDTIME 30 tablet 1  . Dulaglutide (TRULICITY) 1.5 UU/7.2ZD SOPN INJECT 1.5MG INTO THE SKIN ONCE WEEKLY IN AM 4 pen 11  . empagliflozin (JARDIANCE) 25 MG TABS tablet  Take 25 mg by mouth daily. 30 tablet 11  . famotidine (PEPCID) 20 MG tablet TAKE ONE (1) TABLET BY MOUTH EVERY NIGHTAT BEDTIME 30 tablet 0  . FLUoxetine (PROZAC) 20 MG tablet TAKE 1 TABLET BY MOUTH DAILY 30 tablet 5  . glucose blood (ONE TOUCH ULTRA TEST) test strip Test blood sugar 3 times daily. Dx code: E11.9. Insulin treated 300 each 3  . insulin aspart (NOVOLOG FLEXPEN) 100 UNIT/ML FlexPen Inject 20-25 Units into the skin 3 (three) times daily with meals. 15 mL 11  . insulin detemir (LEVEMIR) 100 UNIT/ML injection Inject 58 Units into the skin daily.    . metFORMIN (GLUCOPHAGE) 1000 MG tablet Take 1 tablet (1,000 mg total) by mouth 2 (two) times daily with a meal. 60 tablet 11  . Multiple Vitamin (MULTIVITAMIN) capsule Take 1 capsule by mouth daily.    Marland Kitchen omeprazole (PRILOSEC) 40 MG capsule Take 40 mg by mouth daily as needed (acid reflux).     Marland Kitchen spironolactone (ALDACTONE) 25 MG tablet TAKE 0.5 TABLETS (12.5 MG TOTAL) BY MOUTH DAILY. 45 tablet 1   No current facility-administered medications for this visit.     Allergies  Allergen Reactions  . Diclofenac Sodium Shortness Of Breath, Itching and Other (See Comments)    Hypotension   . Voltaren [Diclofenac Sodium] Anaphylaxis  . Metoprolol     Lightheaded/dizzy/SOB.    . Ramipril     Lightheaded/dizzy/SOB.      Social History   Socioeconomic History  . Marital status: Married    Spouse name: Not on file  . Number of children: 3  . Years of education: Not on file  . Highest education level: Not on file  Occupational History  . 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 Financial resource strain: Not on file  . Food insecurity:    Worry: Not on file    Inability: Not on file  . Transportation needs:    Medical: Not on file    Non-medical: Not on file  Tobacco Use  . Smoking status: Former Smoker    Packs/day: 0.25     Years: 2.00    Pack years: 0.50    Types: Cigarettes  . Smokeless tobacco: Never Used  Substance and Sexual Activity  . Alcohol use: Yes    Alcohol/week: 0.0 standard drinks    Comment: occasional Beer once every two weeks on the weekends  . Drug use: No  . Sexual activity: Yes  Lifestyle  . Physical activity:    Days per week: Not on file    Minutes per session: Not on file  . Stress: Not on file  Relationships  . Social connections:  Talks on phone: Not on file    Gets together: Not on file    Attends religious service: Not on file    Active member of club or organization: Not on file    Attends meetings of clubs or organizations: Not on file    Relationship status: Not on file  . Intimate partner violence:    Fear of current or ex partner: Not on file    Emotionally abused: Not on file    Physically abused: Not on file    Forced sexual activity: Not on file  Other Topics Concern  . Not on file  Social History Narrative   Married 1986       Children: 3 children, 3 grandchildren; 2 great grandkids      Lives: with wife, dog.      Employment: employed.      Tobacco: never      Alcohol: beer twice per month on weekends.      Drugs: none      Exercise: walking three days per week.      Seatbelt: 100% of time      Guns: loaded and secured/locked.      Sexual activity: sexually active with wife.      Sunscreen: yes.    Family History  Problem Relation Age of Onset  . Cancer Father        skin  . Heart disease Father 71       AMI first age 26; stents.  . Diabetes Father   . Hypertension Father   . Hyperlipidemia Father   . Peripheral Artery Disease Father   . Parkinson's disease Father   . Cancer Paternal Grandfather        skin  . Diabetes Mother   . Arthritis Mother        total hip replacement  . Kidney failure Mother   . Depression Sister   . Diabetes Sister   . Cancer Maternal Uncle        lymphoma  . Prostate cancer Maternal Uncle   . Colon cancer  Neg Hx     Review of Systems:  As stated in the HPI and otherwise negative.   BP (!) 152/80   Pulse 94   Ht 6' 1"  (1.854 m)   Wt 262 lb (118.8 kg)   SpO2 96%   BMI 34.57 kg/m   Physical Examination:  General: Well developed, well nourished, NAD  HEENT: OP clear, mucus membranes moist  SKIN: warm, dry. No rashes. Neuro: No focal deficits  Musculoskeletal: Muscle strength 5/5 all ext  Psychiatric: Mood and affect normal  Neck: No JVD, no carotid bruits, no thyromegaly, no lymphadenopathy.  Lungs:Clear bilaterally, no wheezes, rhonci, crackles Cardiovascular: Regular rate and rhythm. No murmurs, gallops or rubs. Abdomen:Soft. Bowel sounds present. Non-tender.  Extremities: No lower extremity edema. Pulses are 2 + in the bilateral DP/PT.  Cardiac cath 04/02/14: HEMODYNAMICS: Aortic pressure 130/69 mmHg; LV pressure 146/14 mmHg; LVEDP 22 mm mercury ANGIOGRAPHIC DATA: The left main coronary artery is widely patent/normal. The left anterior descending artery is proximally calcified free of any significant obstruction. The LAD wraps around the left ventricular apex. A large first diagonal arises and has ostial to proximal 30% narrowing. . The left circumflex artery is widely patent and normal. Gives origin to 3 obtuse marginal branches with the first and second being the larger of the 3. The right coronary artery is widely patent with distal 30% narrowing.Marland Kitchen LEFT VENTRICULOGRAM: Left ventricular angiogram  was done in the 30 RAO projection and revealed normal cavity size and contractility with an EF of 60%  Echo 03/15/17: - Left ventricle: The cavity size was normal. Wall thickness was   normal. The estimated ejection fraction was 55%. Wall motion was   normal; there were no regional wall motion abnormalities. Doppler   parameters are consistent with abnormal left ventricular   relaxation (grade 1 diastolic dysfunction). - Aortic valve: There was no stenosis. - Mitral valve: There  was no significant regurgitation. - Right ventricle: The cavity size was normal. Systolic function   was normal. - Pulmonary arteries: No complete TR doppler jet so unable to   estimate PA systolic pressure. - Inferior vena cava: The vessel was normal in size. The   respirophasic diameter changes were in the normal range (= 50%),   consistent with normal central venous pressure. Impressions:  - Normal LV size with EF 55%. Normal RV size and systolic function.   No significant valvular abnormalities.  EKG:  EKG is ordered today. The ekg ordered today demonstrates NSR, rate 94 bpm.   Recent Labs: 10/26/2017: Hemoglobin 14.4; Platelets 303 01/18/2018: ALT 12; BUN 18; Creatinine, Ser 1.01; Potassium 4.7; Sodium 141   Lipid Panel    Component Value Date/Time   CHOL 138 01/18/2018 0810   TRIG 104.0 01/18/2018 0810   TRIG 88 07/24/2006 1009   HDL 42.00 01/18/2018 0810   CHOLHDL 3 01/18/2018 0810   VLDL 20.8 01/18/2018 0810   LDLCALC 75 01/18/2018 0810     Wt Readings from Last 3 Encounters:  04/02/18 262 lb (118.8 kg)  03/02/18 265 lb (120.2 kg)  01/22/18 262 lb 8 oz (119.1 kg)     Other studies Reviewed: Additional studies/ records that were reviewed today include: . Review of the above records demonstrates:    Assessment and Plan:   1. Chronic diastolic VZC:HYIFOY is stable. Continue aldactone.    2. Coronary artery disease without angina:He has mild CAD by cath in 2015. No chest pain. Continue ASA and statin.    3. Essential hypertension:BP is controlled at home per pt. No changes today.    4. Hyperlipidemia: Managed by primary care. Will continue statin.   Current medicines are reviewed at length with the patient today.  The patient does not have concerns regarding medicines.  The following changes have been made:  no change  Labs/ tests ordered today include:   Orders Placed This Encounter  Procedures  . EKG 12-Lead    Disposition:   F/U with me  in 12 months  Signed, Lauree Chandler, MD 04/02/2018 12:13 PM    [facility] [address], [place], [place]  [address] Phone: [phone]; Fax: [phone]

## 2018-04-02 NOTE — Patient Instructions (Signed)

## 2018-04-16 ENCOUNTER — Encounter (HOSPITAL_COMMUNITY): Payer: Self-pay

## 2018-05-03 ENCOUNTER — Ambulatory Visit (INDEPENDENT_AMBULATORY_CARE_PROVIDER_SITE_OTHER): Payer: BC Managed Care – PPO

## 2018-05-03 DIAGNOSIS — Z23 Encounter for immunization: Secondary | ICD-10-CM | POA: Diagnosis not present

## 2018-05-04 ENCOUNTER — Other Ambulatory Visit: Payer: Self-pay | Admitting: Family Medicine

## 2018-05-25 ENCOUNTER — Ambulatory Visit: Payer: BC Managed Care – PPO | Admitting: Cardiovascular Disease

## 2018-06-11 ENCOUNTER — Encounter: Payer: Self-pay | Admitting: Internal Medicine

## 2018-06-11 NOTE — Telephone Encounter (Signed)
Please call to reschedule appt at pt request per MyChart

## 2018-06-13 ENCOUNTER — Ambulatory Visit: Payer: BC Managed Care – PPO | Admitting: Internal Medicine

## 2018-07-05 ENCOUNTER — Ambulatory Visit: Payer: BC Managed Care – PPO | Admitting: Internal Medicine

## 2018-07-20 ENCOUNTER — Ambulatory Visit: Payer: BC Managed Care – PPO | Admitting: Internal Medicine

## 2018-08-27 ENCOUNTER — Other Ambulatory Visit: Payer: Self-pay | Admitting: *Deleted

## 2018-08-27 ENCOUNTER — Encounter: Payer: Self-pay | Admitting: Family Medicine

## 2018-08-27 MED ORDER — CYCLOBENZAPRINE HCL 5 MG PO TABS: 5.0000 mg | ORAL_TABLET | Freq: Every day | ORAL | 1 refills | Status: DC

## 2018-08-27 NOTE — Telephone Encounter (Signed)
Sent. Thanks.   

## 2018-08-27 NOTE — Telephone Encounter (Signed)
Patient request:  Flexeril Last office visit:   01/22/18 Last Filled:     30 tablet 1 01/04/2018  Please advise.

## 2018-09-16 ENCOUNTER — Telehealth: Payer: BC Managed Care – PPO | Admitting: Family

## 2018-09-16 DIAGNOSIS — J019 Acute sinusitis, unspecified: Secondary | ICD-10-CM

## 2018-09-16 MED ORDER — DOXYCYCLINE HYCLATE 100 MG PO TABS: 100.0000 mg | ORAL_TABLET | Freq: Two times a day (BID) | ORAL | 0 refills | Status: DC

## 2018-09-16 NOTE — Addendum Note (Signed)
Addended by: Jannifer Rodney A on: 09/16/2018 03:31 PM   Modules accepted: Orders

## 2018-09-16 NOTE — Progress Notes (Signed)
We are sorry that you are not feeling well.  Here is how we plan to help!  Based on what you have shared with me it looks like you have sinusitis.  Sinusitis is inflammation and infection in the sinus cavities of the head.  Based on your presentation I believe you most likely have Acute Bacterial Sinusitis.  This is an infection caused by bacteria and is treated with antibiotics. I have prescribed Doxycycline 100mg  by mouth twice a day for 10 days. You may use an oral decongestant such as Mucinex D or if you have glaucoma or high blood pressure use plain Mucinex. Saline nasal spray help and can safely be used as often as needed for congestion.  If you develop worsening sinus pain, fever or notice severe headache and vision changes, or if symptoms are not better after completion of antibiotic, please schedule an appointment with a health care provider.    Approximately five minutes was spent reviewing document and patient's chart.   Sinus infections are not as easily transmitted as other respiratory infection, however we still recommend that you avoid close contact with loved ones, especially the very young and elderly.  Remember to wash your hands thoroughly throughout the day as this is the number one way to prevent the spread of infection!  Home Care:  Only take medications as instructed by your medical team.  Complete the entire course of an antibiotic.  Do not take these medications with alcohol.  A steam or ultrasonic humidifier can help congestion.  You can place a towel over your head and breathe in the steam from hot water coming from a faucet.  Avoid close contacts especially the very young and the elderly.  Cover your mouth when you cough or sneeze.  Always remember to wash your hands.  Get Help Right Away If:  You develop worsening fever or sinus pain.  You develop a severe head ache or visual changes.  Your symptoms persist after you have completed your treatment  plan.  Make sure you  Understand these instructions.  Will watch your condition.  Will get help right away if you are not doing well or get worse.  Your e-visit answers were reviewed by a board certified advanced clinical practitioner to complete your personal care plan.  Depending on the condition, your plan could have included both over the counter or prescription medications.  If there is a problem please reply  once you have received a response from your provider.  Your safety is important to [name].  If you have drug allergies check your prescription carefully.    You can use MyChart to ask questions about today's visit, request a non-urgent call back, or ask for a work or school excuse for 24 hours related to this e-Visit. If it has been greater than 24 hours you will need to follow up with your provider, or enter a new e-Visit to address those concerns.  You will get an e-mail in the next two days asking about your experience.  I hope that your e-visit has been valuable and will speed your recovery. Thank you for using e-visits.

## 2018-09-19 ENCOUNTER — Other Ambulatory Visit: Payer: Self-pay | Admitting: Family Medicine

## 2018-09-19 DIAGNOSIS — I5032 Chronic diastolic (congestive) heart failure: Secondary | ICD-10-CM

## 2018-09-21 ENCOUNTER — Ambulatory Visit: Payer: BC Managed Care – PPO | Admitting: Family Medicine

## 2018-09-21 ENCOUNTER — Encounter: Payer: Self-pay | Admitting: Family Medicine

## 2018-09-21 ENCOUNTER — Telehealth: Payer: Self-pay

## 2018-09-21 ENCOUNTER — Other Ambulatory Visit: Payer: Self-pay

## 2018-09-21 DIAGNOSIS — J019 Acute sinusitis, unspecified: Secondary | ICD-10-CM | POA: Diagnosis not present

## 2018-09-21 DIAGNOSIS — I5032 Chronic diastolic (congestive) heart failure: Secondary | ICD-10-CM

## 2018-09-21 MED ORDER — SPIRONOLACTONE 25 MG PO TABS: 12.5000 mg | ORAL_TABLET | Freq: Every day | ORAL | Status: DC

## 2018-09-21 MED ORDER — INSULIN DETEMIR 100 UNIT/ML ~~LOC~~ SOLN: 52.0000 [IU] | Freq: Every day | SUBCUTANEOUS | Status: DC

## 2018-09-21 NOTE — Patient Instructions (Addendum)
Presumed partially treated sinus infection.  Rest and fluids.  Finish doxycycline.  Skip spironolactone for a few days.  Restart if BP is >130/>90.  Update me as needed.  Take care.  Glad to see you.

## 2018-09-21 NOTE — Telephone Encounter (Signed)
See OV note.  Thanks.  

## 2018-09-21 NOTE — Progress Notes (Signed)
He had at least a week of facial pain and sinus pressure.  Then ear pressure (left).  E visit done and started on doxycycline.  Still on abx.  Congestion is some better.  HA only a little better.  Taking mucinex.  Still with some occ intermittent L ear pain.    He has been episodically SOB with exertion, some days more than others.  SABA helps temporarily.  Breathing is better today.  No fevers.  Mildly off balance over the last few weeks, he can get lightheaded right after standing, better today.  No falls.  Fatigue noted.    He feels some better today overall.  Meds, vitals, and allergies reviewed.   ROS: Per HPI unless specifically indicated in ROS section   GEN: nad, alert and oriented HEENT: mucous membranes moist, tm w/o erythema, nasal exam w/o erythema, clear discharge noted,  OP with cobblestoning NECK: supple w/o LA CV: regular but tachy at rest, not lightheaded on standing.  Pulse does not increase with standing. PULM: ctab, no inc wob EXT: no edema SKIN: no acute rash Sinuses not ttp

## 2018-09-21 NOTE — Telephone Encounter (Signed)
[name] (DPR signed) said pt had e visit on 09/14/18 and given doxycycline; pt continues with H/A, sinus, dizziness,SOB upon exertion, when sitting no SOB, off balance, stumbling at times No CP and no fever. Pt was at [facility] with his wife 2 1/2 wks ago. Dr Para March said mask and pt can come in for appt. Pt's wife scheduled appt with Dr Para March 09/21/18 at 11;45 with ED precautions.FYI to Dr Para March.

## 2018-09-23 DIAGNOSIS — J019 Acute sinusitis, unspecified: Secondary | ICD-10-CM | POA: Insufficient documentation

## 2018-09-23 NOTE — Assessment & Plan Note (Signed)
Presumed partially treated sinus infection.  Rest and fluids.  Finish doxycycline.  Skip spironolactone for a few days since he had previously been lightheaded. Restart if BP is >130/>90.  Okay to use albuterol as needed.  Lungs are still clear. Update me as needed.  He does appear to be some better.  He says he feels some better.  He does not appear to be in failure.

## 2018-10-17 ENCOUNTER — Encounter: Payer: Self-pay | Admitting: Internal Medicine

## 2018-10-17 ENCOUNTER — Other Ambulatory Visit: Payer: Self-pay | Admitting: Internal Medicine

## 2018-10-19 ENCOUNTER — Other Ambulatory Visit: Payer: Self-pay | Admitting: Internal Medicine

## 2018-10-23 ENCOUNTER — Ambulatory Visit: Payer: BC Managed Care – PPO | Admitting: Internal Medicine

## 2018-10-24 ENCOUNTER — Ambulatory Visit (INDEPENDENT_AMBULATORY_CARE_PROVIDER_SITE_OTHER): Payer: BC Managed Care – PPO | Admitting: Internal Medicine

## 2018-10-24 ENCOUNTER — Other Ambulatory Visit: Payer: Self-pay

## 2018-10-24 ENCOUNTER — Other Ambulatory Visit: Payer: Self-pay | Admitting: Internal Medicine

## 2018-10-24 ENCOUNTER — Encounter: Payer: Self-pay | Admitting: Internal Medicine

## 2018-10-24 DIAGNOSIS — E1159 Type 2 diabetes mellitus with other circulatory complications: Secondary | ICD-10-CM

## 2018-10-24 DIAGNOSIS — E1165 Type 2 diabetes mellitus with hyperglycemia: Secondary | ICD-10-CM

## 2018-10-24 DIAGNOSIS — E785 Hyperlipidemia, unspecified: Secondary | ICD-10-CM

## 2018-10-24 DIAGNOSIS — Z6833 Body mass index (BMI) 33.0-33.9, adult: Secondary | ICD-10-CM

## 2018-10-24 DIAGNOSIS — E669 Obesity, unspecified: Secondary | ICD-10-CM

## 2018-10-24 MED ORDER — METFORMIN HCL 1000 MG PO TABS
1000.0000 mg | ORAL_TABLET | Freq: Two times a day (BID) | ORAL | 2 refills | Status: DC
Start: 2018-10-24 — End: 2018-10-24

## 2018-10-24 MED ORDER — METFORMIN HCL 1000 MG PO TABS: 1000.0000 mg | ORAL_TABLET | Freq: Two times a day (BID) | ORAL | 2 refills | Status: DC

## 2018-10-24 MED ORDER — INSULIN ASPART 100 UNIT/ML FLEXPEN: 20.0000 [IU] | PEN_INJECTOR | Freq: Three times a day (TID) | SUBCUTANEOUS | 3 refills | Status: DC

## 2018-10-24 MED ORDER — INSULIN DETEMIR 100 UNIT/ML FLEXPEN: 52.0000 [IU] | PEN_INJECTOR | Freq: Every day | SUBCUTANEOUS | 3 refills | Status: DC

## 2018-10-24 MED ORDER — EMPAGLIFLOZIN 25 MG PO TABS: 25.0000 mg | ORAL_TABLET | Freq: Every day | ORAL | 11 refills | Status: DC

## 2018-10-24 MED ORDER — DULAGLUTIDE 1.5 MG/0.5ML ~~LOC~~ SOAJ: SUBCUTANEOUS | 3 refills | Status: DC

## 2018-10-24 MED ORDER — EMPAGLIFLOZIN 25 MG PO TABS
25.0000 mg | ORAL_TABLET | Freq: Every day | ORAL | 11 refills | Status: DC
Start: 2018-10-24 — End: 2018-10-24

## 2018-10-24 NOTE — Progress Notes (Signed)
Patient ID: [name], male   DOB: 02/25/1961, 58 y.o.   MRN: [id number]   Patient location: Home My location: Office  Referring Provider: Joaquim Namuncan, Graham S, MD  I connected with the patient on 10/24/18 at 9:59 AM EDT by telephone and verified that I am speaking with the correct person.   I discussed the limitations of evaluation and management by telephone and the availability of in person appointments. The patient expressed understanding and agreed to proceed.   Details of the encounter are shown below.  HPI: [name] is a 58 y.o.-year-old male, presenting for f/u for DM2, dx in 1993, insulin-dependent, uncontrolled since ~2005, with complications (CAD, CHF, gastroparesis, ED). Last visit 8 mo ago.  He is still busy taking care of his wife who is sick.  Last hemoglobin A1c was: Lab Results  Component Value Date   HGBA1C 10.0 (H) 01/18/2018   HGBA1C 11.6 (H) 09/27/2017   HGBA1C 11.6 06/29/2017  03/07/2017: HbA1c calculated from the fructosamine 10%  Pt is on a regimen of: - Metformin 1000 mg 2X a day with meals - Jardiance 25 mg before breakfast - Trulicity 1.5 mg weekly in a.m. - Levemir 60 >> 52 units in HS >>  in a.m. - NovoLog 20-25 units 3X a day, before each meal. He also tried other oral meds >> cannot remember name.  Pt checks his sugars 2x times a day: - am:  71-135, 151, 165 >> 73, 121-130, 217 - 2h after b'fast:98-203 >> 143-181 >> 143-258 >> n/c - before lunch: 145-206 >> 145-238 >> 130-191 - 2h after lunch: 3-5 (meds late) >> n/c >> 352 >> n/c - before dinner:205-358 >> 259-313 >> 220-241 - 2h after dinner:  300-412 >> 248 >> n/c >> 95 >> n/c - bedtime:  210, 244, 278 >> 210-278 >> 111-200 - nighttime: n/c >> 68-82 >> n/c Lowest sugar was 60 at 3 am >> 73; he has hypoglycemia awareness in the 90s. Highest sugar was 316 (sick, no insulin) >>220x   Glucometer: One Touch Ultra   Pt's meals are: - Breakfast (usu. 30 units): sausage, 1 egg, bacon, rarely  bisquit; cereals - Lunch (usu. 20 units): soup, sandwich - when working: fast food  - Dinner (usu. 30 units): meat/fish + vegetable, including sweet potatoes + occasionally: starch - Snacks: no dessert usually, cookies, crackers; diet soda, occasionally regular sodas, occasionally milk  He has a history of lap band surgery in 2012.  + CKD, last BUN/creatinine:  Lab Results  Component Value Date   BUN 18 01/18/2018   BUN 22 (H) 10/26/2017   CREATININE 1.01 01/18/2018   CREATININE 1.22 10/26/2017  Off Losartan, Valsartan >> SOB, sweating, dizziness.  Now on metoprolol. + HL; last set of lipids: Lab Results  Component Value Date   CHOL 138 01/18/2018   HDL 42.00 01/18/2018   LDLCALC 75 01/18/2018   TRIG 104.0 01/18/2018   CHOLHDL 3 01/18/2018  On atorvastatin. - last eye exam was in 2016: No DR -+ Numbness in his legs after his back surgery.  He had NCT. On ASA 81.  He had RMSF in 2012.  ROS: Constitutional: no weight gain/no weight loss, no fatigue, no subjective hyperthermia, no subjective hypothermia Eyes: no blurry vision, no xerophthalmia ENT: no sore throat, no nodules palpated in neck, no dysphagia, no odynophagia, no hoarseness Cardiovascular: no CP/no SOB/no palpitations/no leg swelling Respiratory: no cough/no SOB/no wheezing Gastrointestinal: no N/no V/no D/no C/no acid reflux Musculoskeletal: no muscle aches/no joint  aches Skin: no rashes, no hair loss Neurological: no tremors/no numbness/no tingling/no dizziness  I reviewed pt's medications, allergies, PMH, social hx, family hx, and changes were documented in the history of present illness. Otherwise, unchanged from my initial visit note.  Past Medical History:  Diagnosis Date  . Allergic rhinitis, cause unspecified   . CHF (congestive heart failure) (HCC)   . Degeneration of intervertebral disc, site unspecified   . Diabetes mellitus   . Hyperlipidemia   . Hypertension   . Impotence of organic origin   .  Obesity, unspecified   . Obstructive sleep apnea (adult) (pediatric)   . Other chronic allergic conjunctivitis   . Personal history of colonic polyps    Past Surgical History:  Procedure Laterality Date  . admission  01/2009   [facility].  Syncopal event with nausea, diaphoresis, hypertension.  D-dimer elevated at 2.54 with negative CT chest. and VQ scan.  Cardiac enzymes negative x 3.  Labs normal.  Cardiology consult---cardiolite abn; cath, 2D-echo, holter monitor unrevealing.  SE H&V.  Marland Kitchen Allergy Consult  07/11/2010   SOB, itching, facial tingling, hypotension.  Allergy testing: no food allergies; +reaction to tree pollen, grass pollen, dust mites, mold.  Avoid diclofenac and similar products.  [name].  . APPENDECTOMY    . arthroscopy of knee  1980  . BACK SURGERY     x3  . COLONOSCOPY  07/11/2009   single polyp; repeat in 5 years.  [name].  . GI consult  07/11/2010   CT chest in ED (gastric thickening).  [name].  No EGD warranted due to lack of symptoms.  Marland Kitchen LAPAROSCOPIC GASTRIC BANDING  09/15/09  . LEFT HEART CATHETERIZATION WITH CORONARY ANGIOGRAM N/A 04/02/2014   Procedure: LEFT HEART CATHETERIZATION WITH CORONARY ANGIOGRAM;  Surgeon: Lesleigh Noe, MD;  Location: [facility] C[facility] LAB;  Service: Cardiovascular;  Laterality: N/A;  . QUADRICEPS REPAIR     left  . QUADRICEPS TENDON REPAIR     left  . ROTATOR CUFF REPAIR     right  . Sleep study  07/11/2010   repeat sleep study due to weight loss.  Severe OSA; CPAP titration to 8 cm.    . SPINE SURGERY    . TONSILLECTOMY AND ADENOIDECTOMY  1960   Social History   Social History  . Marital status: Married    Spouse name: N/A  . Number of children: 3  . Years of education: N/A   Occupational History  . 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 Smoking status: Former Smoker, quit in 1984    Packs/day: 0.25    Years: 2.00    Types: Cigarettes  . Smokeless  tobacco: Never Used  . Alcohol use 0.0 oz/week     Comment: occasional Beer once every two weeks on the weekends  . Drug use: No   Social History Narrative   Married 1986       Children: 3 children, 3 grandchildren; 2 great grandkids      Lives: with wife, dog.      Alcohol: beer twice per month on weekends.      Drugs: none      Exercise: walking three days per week.   Current Outpatient Medications on File Prior to Visit  Medication Sig Dispense Refill  . albuterol (PROVENTIL HFA;VENTOLIN HFA) 108 (90 Base) MCG/ACT inhaler Inhale 1-2 puffs into the lungs every 6 (six) hours as  needed for wheezing or shortness of breath. 1 Inhaler 1  . aspirin EC 81 MG tablet Take 81 mg by mouth daily.    Marland Kitchen atorvastatin (LIPITOR) 40 MG tablet Take 1 tablet (40 mg total) by mouth daily. 90 tablet 3  . B-D INS SYR ULTRAFINE 1CC/31G 31G X 5/16" 1 ML MISC USE FOR INSULIN INJECTIONS THREE TIMES DAILY 100 each 5  . Calcium-Magnesium-Vitamin D (CALCIUM 500 PO) Take 500 mg by mouth 3 (three) times daily.    . cyclobenzaprine (FLEXERIL) 5 MG tablet Take 1 tablet (5 mg total) by mouth at bedtime. 30 tablet 1  . doxycycline (VIBRA-TABS) 100 MG tablet Take 1 tablet (100 mg total) by mouth 2 (two) times daily. 20 tablet 0  . empagliflozin (JARDIANCE) 25 MG TABS tablet Take 25 mg by mouth daily. 30 tablet 11  . famotidine (PEPCID) 20 MG tablet TAKE ONE (1) TABLET BY MOUTH EVERY NIGHTAT BEDTIME 30 tablet 0  . FLUoxetine (PROZAC) 20 MG tablet TAKE 1 TABLET BY MOUTH DAILY 90 tablet 2  . glucose blood (ONE TOUCH ULTRA TEST) test strip Test blood sugar 3 times daily. Dx code: E11.9. Insulin treated 300 each 3  . insulin aspart (NOVOLOG FLEXPEN) 100 UNIT/ML FlexPen Inject 20-25 Units into the skin 3 (three) times daily with meals. 15 mL 11  . insulin detemir (LEVEMIR) 100 UNIT/ML injection Inject 0.52 mLs (52 Units total) into the skin daily. 10 mL   . metFORMIN (GLUCOPHAGE) 1000 MG tablet Take 1 tablet (1,000 mg total) by  mouth 2 (two) times daily with a meal. 60 tablet 11  . Multiple Vitamin (MULTIVITAMIN) capsule Take 1 capsule by mouth daily.    Marland Kitchen omeprazole (PRILOSEC) 40 MG capsule Take 40 mg by mouth daily as needed (acid reflux).     Marland Kitchen spironolactone (ALDACTONE) 25 MG tablet Take 0.5 tablets (12.5 mg total) by mouth daily. HELD AS OF 09/21/2018    . TRULICITY 1.5 MG/0.5ML SOPN INJECT 1.5MG  INTO THE SKIN ONCE WEEKLY IN AM 2 pen 11   No current facility-administered medications on file prior to visit.    Allergies  Allergen Reactions  . Diclofenac Sodium Shortness Of Breath, Itching and Other (See Comments)    Hypotension   . Voltaren [Diclofenac Sodium] Anaphylaxis  . Metoprolol     Lightheaded/dizzy/SOB.    . Ramipril     Lightheaded/dizzy/SOB.     Family History  Problem Relation Age of Onset  . Cancer Father        skin  . Heart disease Father 36       AMI first age 74; stents.  . Diabetes Father   . Hypertension Father   . Hyperlipidemia Father   . Peripheral Artery Disease Father   . Parkinson's disease Father   . Cancer Paternal Grandfather        skin  . Diabetes Mother   . Arthritis Mother        total hip replacement  . Kidney failure Mother   . Depression Sister   . Diabetes Sister   . Cancer Maternal Uncle        lymphoma  . Prostate cancer Maternal Uncle   . Colon cancer Neg Hx    PE: There were no vitals taken for this visit. Wt Readings from Last 3 Encounters:  09/21/18 251 lb 8 oz (114.1 kg)  04/02/18 262 lb (118.8 kg)  03/02/18 265 lb (120.2 kg)   Constitutional:  in NAD  The physical exam was not  performed (telephone visit).  ASSESSMENT: 1. DM2, insulin-dependent, uncontrolled, with complications - CAD - CHF - gastroparesis - ED  2. HL  3. Obesity class 1 BMI Classification:  < 18.5 underweight   18.5-24.9 normal weight   25.0-29.9 overweight   30.0-34.9 class I obesity   35.0-39.9 class II obesity   ? 40.0 class III obesity   PLAN:   1. Patient with longstanding, uncontrolled, type 2 diabetes, on oral medications, weekly GLP-1 receptor agonist, and basal-bolus insulin regimen, with still poor diabetes control.  At last visit, sugars were slightly better, but they were still increasing in a stepwise fashion throughout the day as he was still forgetting NovoLog doses.  His CBGs were dropping in the middle of the night occasionally to the 60s and at that time we moved his Levemir in the morning. -At this visit, sugars are better than before, but they are still high before lunch and before dinner.  He is using the lower end of the interval dose for NovoLog so I advised him to increase the doses with these meals slightly.  Otherwise, we can continue the current regimen.  I refilled his other medicines. - I suggested to:  Patient Instructions  Please continue: - Metformin 1000 mg 2X a day with meals - Jardiance 25 mg before breakfast - Trulicity 1.5 mg weekly in a.m. - Levemir 52 units in a.m. - NovoLog 20-25 units 3X a day, before each meal (use mostly 25 units before b'fast and lunch)  Please return in 3 months with your sugar log.  - We will check his HbA1c when he returns to the clinic - continue checking sugars at different times of the day - check 3x a day, rotating checks - advised for yearly eye exams >> he is not UTD - Return to clinic in 3 mo with sugar log   2. HL - Reviewed latest lipid panel from 01/2018: All fractions at goal Lab Results  Component Value Date   CHOL 138 01/18/2018   HDL 42.00 01/18/2018   LDLCALC 75 01/18/2018   TRIG 104.0 01/18/2018   CHOLHDL 3 01/18/2018  - Continues atorvastatin without side effects.  3. Obesity class 1 -He previously lost 40 pounds due to glucose toxicity, but his weight plateaued afterwards, with minimum changes; since last OV he lost 14 lbs since last visit -Continue Trulicity and Jardiance, which should both help with weight loss  - time spent with the patient:  14 min, of which >50% was spent in obtaining information about his symptoms, reviewing his previous labs, evaluations, and treatments, counseling him about his condition (please see the discussed topics above), and developing a plan to further investigate and treat it; he had a number of questions which I addressed.  Carlus Pavlov, MD PhD [facility] Endocrinology

## 2018-10-24 NOTE — Addendum Note (Signed)
Addended by: Darliss Ridgel I on: 10/24/2018 10:36 AM   Modules accepted: Orders

## 2018-10-24 NOTE — Patient Instructions (Addendum)
Please continue: - Metformin 1000 mg 2X a day with meals - Jardiance 25 mg before breakfast - Trulicity 1.5 mg weekly in a.m. - Levemir 52 units in a.m. - NovoLog 20-25 units 3X a day, before each meal (use mostly 25 units before b'fast and lunch)  Please return in 3 months with your sugar log.

## 2018-12-12 ENCOUNTER — Other Ambulatory Visit: Payer: Self-pay | Admitting: Family Medicine

## 2018-12-12 DIAGNOSIS — I5032 Chronic diastolic (congestive) heart failure: Secondary | ICD-10-CM

## 2018-12-12 NOTE — Telephone Encounter (Signed)
Dr. Para March please advise looks as though pt help medication on 3/13. Okay to send in?

## 2018-12-12 NOTE — Telephone Encounter (Signed)
Please verify use/dose with patient I can refill if needed. Please set up cpe, okay to do via web visit. Thanks.

## 2018-12-13 NOTE — Telephone Encounter (Signed)
Patient advised.  Patient will call in for an appt for a web visit for the CPE.  Patient states the pharmacy has already filled this prescription.

## 2018-12-20 ENCOUNTER — Other Ambulatory Visit: Payer: Self-pay | Admitting: *Deleted

## 2018-12-20 DIAGNOSIS — I5032 Chronic diastolic (congestive) heart failure: Secondary | ICD-10-CM

## 2018-12-20 NOTE — Telephone Encounter (Signed)
Faxed refill request.  Spironolactone Last office visit:   09/21/2018 Last Filled:    spironolactone (ALDACTONE) 25 MG tablet   09/21/2018    Sig - Route: Take 0.5 tablets (12.5 mg total) by mouth daily. HELD AS OF 09/21/2018 - Oral   Please advise.   Note says held as of 09/21/2018, ? Auto refill.

## 2018-12-23 NOTE — Telephone Encounter (Signed)
Please verify use/dose with patient.  Per previous phone note this has already been filled recently by the pharmacy.  And he still needs CPE set up when possible.  Thanks.

## 2018-12-24 NOTE — Telephone Encounter (Signed)
Left detailed message on voicemail to return call with information. 

## 2018-12-27 NOTE — Telephone Encounter (Signed)
Patient takes the medication prn and he has plenty on hand right now.  The pharmacy apparently is doing an auto refill process.  Patient has asked the pharmacy to take him off auto refill but they have not complied.

## 2019-01-24 ENCOUNTER — Ambulatory Visit: Payer: BC Managed Care – PPO | Admitting: Internal Medicine

## 2019-01-24 DIAGNOSIS — Z0289 Encounter for other administrative examinations: Secondary | ICD-10-CM

## 2019-01-24 NOTE — Patient Instructions (Signed)
Please continue: - Metformin 1000 mg 2X a day with meals - Jardiance 25 mg before breakfast - Trulicity 1.5 mg weekly in a.m. - Levemir 52 units in a.m. - NovoLog 20-25 units 3X a day before each meal  Please return in 3-4 months with your sugar log.

## 2019-01-24 NOTE — Progress Notes (Signed)
NO SHOW  Patient ID: [name], male   DOB: 09/19/1960, 58 y.o.   MRN: [id number]   HPI: [name] Oreslan C Zolman is a 58 y.o.-year-old male, presenting for f/u for DM2, dx in 1993, insulin-dependent, uncontrolled since ~2005, with complications (CAD, CHF, gastroparesis, ED). Last visit 3 months ago (virtual.  He still busy taking care of his wife who is sick.  Last hemoglobin A1c was: Lab Results  Component Value Date   HGBA1C 10.0 (H) 01/18/2018   HGBA1C 11.6 (H) 09/27/2017   HGBA1C 11.6 06/29/2017  03/07/2017: HbA1c calculated from the fructosamine 10%  Pt is on a regimen of: - Metformin 1000 mg 2X a day with meals - Jardiance 25 mg before breakfast - Trulicity 1.5 mg weekly in a.m. - Levemir 52 units in a.m. - NovoLog 20-25 units 3X a day before each meal He also tried other oral meds >> cannot remember name.  Pt checks his sugars twice a day: - am:  71-135, 151, 165 >> 73, 121-130, 217 - 2h after b'fast:98-203 >> 143-181 >> 143-258 >> n/c - before lunch: 145-206 >> 145-238 >> 130-191 - 2h after lunch: 3-5 (meds late) >> n/c >> 352 >> n/c - before dinner:205-358 >> 259-313 >> 220-241 - 2h after dinner:  300-412 >> 248 >> n/c >> 95 >> n/c - bedtime:  210, 244, 278 >> 210-278 >> 111-200 - nighttime: n/c >> 68-82 >> n/c Lowest sugar was 60 at 3 am >> 73; he has hypoglycemia awareness in the 90s. Highest sugar was 316 (sick, no insulin) >>220x   Glucometer: One Touch Ultra   Pt's meals are: - Breakfast (usu. 30 units): sausage, 1 egg, bacon, rarely bisquit; cereals - Lunch (usu. 20 units): soup, sandwich - when working: fast food  - Dinner (usu. 30 units): meat/fish + vegetable, including sweet potatoes + occasionally: starch - Snacks: no dessert usually, cookies, crackers; diet soda, occasionally regular sodas, occasionally milk  He has a history of lap band surgery 2012.  + CKD, last BUN/creatinine:  Lab Results  Component Value Date   BUN 18 01/18/2018   BUN 22 (H)  10/26/2017   CREATININE 1.01 01/18/2018   CREATININE 1.22 10/26/2017  She is off losartan and valsartan due to shortness of breath, sweating, dizziness.  Now on metoprolol. + HL; last set of lipids: Lab Results  Component Value Date   CHOL 138 01/18/2018   HDL 42.00 01/18/2018   LDLCALC 75 01/18/2018   TRIG 104.0 01/18/2018   CHOLHDL 3 01/18/2018  On atorvastatin. - last eye exam was in 2016: No DR -+ Numbness in his legs after his back surgery.  He had NCT. On ASA 81.  He had RMSF in 2012.  ROS: Constitutional: no weight gain/no weight loss, no fatigue, no subjective hyperthermia, no subjective hypothermia Eyes: no blurry vision, no xerophthalmia ENT: no sore throat, no nodules palpated in neck, no dysphagia, no odynophagia, no hoarseness Cardiovascular: no CP/no SOB/no palpitations/no leg swelling Respiratory: no cough/no SOB/no wheezing Gastrointestinal: no N/no V/no D/no C/no acid reflux Musculoskeletal: no muscle aches/no joint aches Skin: no rashes, no hair loss Neurological: no tremors/no numbness/no tingling/no dizziness  I reviewed pt's medications, allergies, PMH, social hx, family hx, and changes were documented in the history of present illness. Otherwise, unchanged from my initial visit note.  Past Medical History:  Diagnosis Date  . Allergic rhinitis, cause unspecified   . CHF (congestive heart failure) (HCC)   . Degeneration of intervertebral disc, site unspecified   .  Diabetes mellitus   . Hyperlipidemia   . Hypertension   . Impotence of organic origin   . Obesity, unspecified   . Obstructive sleep apnea (adult) (pediatric)   . Other chronic allergic conjunctivitis   . Personal history of colonic polyps    Past Surgical History:  Procedure Laterality Date  . admission  01/2009   [facility]  Syncopal event with nausea, diaphoresis, hypertension.  D-dimer elevated at 2.54 with negative CT chest. and VQ scan.  Cardiac enzymes negative x 3.  Labs normal.   Cardiology consult---cardiolite abn; cath, 2D-echo, holter monitor unrevealing.  SE H&V.  Marland Kitchen. Allergy Consult  07/11/2010   SOB, itching, facial tingling, hypotension.  Allergy testing: no food allergies; +reaction to tree pollen, grass pollen, dust mites, mold.  Avoid diclofenac and similar products.  [name].  . APPENDECTOMY    . arthroscopy of knee  1980  . BACK SURGERY     x3  . COLONOSCOPY  07/11/2009   single polyp; repeat in 5 years.  [name].  . GI consult  07/11/2010   CT chest in ED (gastric thickening).  [name].  No EGD warranted due to lack of symptoms.  Marland Kitchen. LAPAROSCOPIC GASTRIC BANDING  09/15/09  . LEFT HEART CATHETERIZATION WITH CORONARY ANGIOGRAM N/A 04/02/2014   Procedure: LEFT HEART CATHETERIZATION WITH CORONARY ANGIOGRAM;  Surgeon: Lesleigh NoeHenry W Smith III, MD;  [facility];  Service: Cardiovascular;  Laterality: N/A;  . QUADRICEPS REPAIR     left  . QUADRICEPS TENDON REPAIR     left  . ROTATOR CUFF REPAIR     right  . Sleep study  07/11/2010   repeat sleep study due to weight loss.  Severe OSA; CPAP titration to 8 cm.    . SPINE SURGERY    . TONSILLECTOMY AND ADENOIDECTOMY  1960   Social History   Social History  . Marital status: Married    Spouse name: N/A  . Number of children: 3  . Years of education: N/A   Occupational History  . 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 Smoking status: Former Smoker, quit in 1984    Packs/day: 0.25    Years: 2.00    Types: Cigarettes  . Smokeless tobacco: Never Used  . Alcohol use 0.0 oz/week     Comment: occasional Beer once every two weeks on the weekends  . Drug use: No   Social History Narrative   Married 1986       Children: 3 children, 3 grandchildren; 2 great grandkids      Lives: with wife, dog.      Alcohol: beer twice per month on weekends.      Drugs: none      Exercise: walking three days per week.   Current Outpatient  Medications on File Prior to Visit  Medication Sig Dispense Refill  . albuterol (PROVENTIL HFA;VENTOLIN HFA) 108 (90 Base) MCG/ACT inhaler Inhale 1-2 puffs into the lungs every 6 (six) hours as needed for wheezing or shortness of breath. 1 Inhaler 1  . aspirin EC 81 MG tablet Take 81 mg by mouth daily.    Marland Kitchen. atorvastatin (LIPITOR) 40 MG tablet Take 1 tablet (40 mg total) by mouth daily. 90 tablet 3  . B-D INS SYR ULTRAFINE 1CC/31G 31G X 5/16" 1 ML MISC USE FOR INSULIN INJECTIONS THREE TIMES DAILY 100 each 5  . Calcium-Magnesium-Vitamin D (CALCIUM 500 PO)  Take 500 mg by mouth 3 (three) times daily.    . cyclobenzaprine (FLEXERIL) 5 MG tablet Take 1 tablet (5 mg total) by mouth at bedtime. 30 tablet 1  . doxycycline (VIBRA-TABS) 100 MG tablet Take 1 tablet (100 mg total) by mouth 2 (two) times daily. 20 tablet 0  . Dulaglutide (TRULICITY) 1.5 MG/0.5ML SOPN INJECT 1.5 MG INTO THE SKIN ONCE WEEKLY IN AM 12 pen 3  . empagliflozin (JARDIANCE) 25 MG TABS tablet Take 25 mg by mouth daily. 30 tablet 11  . famotidine (PEPCID) 20 MG tablet TAKE ONE (1) TABLET BY MOUTH EVERY NIGHTAT BEDTIME 30 tablet 0  . FLUoxetine (PROZAC) 20 MG tablet TAKE 1 TABLET BY MOUTH DAILY 90 tablet 2  . glucose blood (ONE TOUCH ULTRA TEST) test strip Test blood sugar 3 times daily. Dx code: E11.9. Insulin treated 300 each 3  . insulin aspart (NOVOLOG FLEXPEN) 100 UNIT/ML FlexPen Inject 20-25 Units into the skin 3 (three) times daily with meals. 15 pen 3  . Insulin Detemir (LEVEMIR FLEXTOUCH) 100 UNIT/ML Pen Inject 52 Units into the skin daily. 45 mL 3  . metFORMIN (GLUCOPHAGE) 1000 MG tablet Take 1 tablet (1,000 mg total) by mouth 2 (two) times daily with a meal. 180 tablet 2  . Multiple Vitamin (MULTIVITAMIN) capsule Take 1 capsule by mouth daily.    Marland Kitchen. omeprazole (PRILOSEC) 40 MG capsule Take 40 mg by mouth daily as needed (acid reflux).     Marland Kitchen. spironolactone (ALDACTONE) 25 MG tablet Take 0.5 tablets (12.5 mg total) by mouth daily.  HELD AS OF 09/21/2018     No current facility-administered medications on file prior to visit.    Allergies  Allergen Reactions  . Diclofenac Sodium Shortness Of Breath, Itching and Other (See Comments)    Hypotension   . Voltaren [Diclofenac Sodium] Anaphylaxis  . Metoprolol     Lightheaded/dizzy/SOB.    . Ramipril     Lightheaded/dizzy/SOB.     Family History  Problem Relation Age of Onset  . Cancer Father        skin  . Heart disease Father [id number]       AMI first age 60; stents.  . Diabetes Father   . Hypertension Father   . Hyperlipidemia Father   . Peripheral Artery Disease Father   . Parkinson's disease Father   . Cancer Paternal Grandfather        skin  . Diabetes Mother   . Arthritis Mother        total hip replacement  . Kidney failure Mother   . Depression Sister   . Diabetes Sister   . Cancer Maternal Uncle        lymphoma  . Prostate cancer Maternal Uncle   . Colon cancer Neg Hx    PE: There were no vitals taken for this visit. Wt Readings from Last 3 Encounters:  09/21/18 251 lb 8 oz (114.1 kg)  04/02/18 262 lb (118.8 kg)  03/02/18 265 lb (120.2 kg)   Constitutional: overweight, in NAD Eyes: PERRLA, EOMI, no exophthalmos ENT: moist mucous membranes, no thyromegaly, no cervical lymphadenopathy Cardiovascular: RRR, No MRG Respiratory: CTA B Gastrointestinal: abdomen soft, NT, ND, BS+ Musculoskeletal: no deformities, strength intact in all 4 Skin: moist, warm, no rashes Neurological: no tremor with outstretched hands, DTR normal in all 4  ASSESSMENT: 1. DM2, insulin-dependent, uncontrolled, with complications - CAD - CHF - gastroparesis - ED  2. HL  3. Obesity class 1 BMI Classification:  < 18.5  underweight   18.5-24.9 normal weight   25.0-29.9 overweight   30.0-34.9 class I obesity   35.0-39.9 class II obesity   ? 40.0 class III obesity   PLAN:  1. Patient with longstanding, uncontrolled, type 2 diabetes, on oral diabetic  medications, weekly GLP-1 receptor agonist and basal-bolus insulin regimen with still poor control.  His last visit was 3 months ago but this was a virtual visit and we could not check an HbA1c.  At that visit, sugars were better, but still high before lunch and dinner and I advised him to use a higher dose of NovoLog before breakfast and lunch.  We have to be careful has a tendency to drop his sugars during the night so in the past we had to move Levemir in the morning.  - I suggested to:  Patient Instructions  Please continue: - Metformin 1000 mg 2X a day with meals - Jardiance 25 mg before breakfast - Trulicity 1.5 mg weekly in a.m. - Levemir 52 units in a.m. - NovoLog 20-25 units 3X a day before each meal  Please return in 3-4 months with your sugar log.  - we checked his HbA1c: 7%  - advised to check sugars at different times of the day - 1x a day, rotating check times - advised for yearly eye exams >> he is UTD - return to clinic in 3-4 months   2. HL - Reviewed latest lipid panel from 01/2018: All fractions at goal Lab Results  Component Value Date   CHOL 138 01/18/2018   HDL 42.00 01/18/2018   LDLCALC 75 01/18/2018   TRIG 104.0 01/18/2018   CHOLHDL 3 01/18/2018  - Continues atorvastatin without side effects. -He is due for another lipid panel  3. Obesity class 1 -He previously lost 40 pounds due to glucose toxicity in the past but his weight plateaued afterwards.  Before last visit, however, he lost 14 pounds. -We will continue Trulicity and Jardiance, which should also both help with weight loss.   Philemon Kingdom, MD PhD [facility] Endocrinology

## 2019-02-28 ENCOUNTER — Ambulatory Visit: Payer: BC Managed Care – PPO | Admitting: Internal Medicine

## 2019-03-14 ENCOUNTER — Other Ambulatory Visit: Payer: Self-pay | Admitting: Family

## 2019-03-14 ENCOUNTER — Other Ambulatory Visit: Payer: Self-pay | Admitting: Family Medicine

## 2019-03-14 DIAGNOSIS — E785 Hyperlipidemia, unspecified: Secondary | ICD-10-CM

## 2019-03-14 DIAGNOSIS — I5032 Chronic diastolic (congestive) heart failure: Secondary | ICD-10-CM

## 2019-03-14 NOTE — Telephone Encounter (Signed)
Electronic refill request. Cyclobenzaprine Last office visit:   09/21/2018 Last Filled:    30 tablet 1 08/27/2018  Please advise.

## 2019-03-15 NOTE — Telephone Encounter (Signed)
Sent. Thanks.   

## 2019-04-10 ENCOUNTER — Ambulatory Visit: Payer: BC Managed Care – PPO | Admitting: Physician Assistant

## 2019-04-21 NOTE — Progress Notes (Signed)
Cardiology Office Note:    Date:  04/23/2019   ID:  [name], DOB 09-Mar-1961, MRN [id number]  PCP:  Tonia Ghent, MD  Cardiologist:  Lauree Chandler, MD  Electrophysiologist:  None   Referring MD: Tonia Ghent, MD   Chief Complaint: follow-up for CAD   History of Present Illness:    [name] is a 58 y.o. male with a history of CAD, chronic diastolic CHF, hypertension, hyperlipidemia, diabetes mellitus, sleep apnea, morbid obesity s/p lap band surgery in 2011 who is followed by Dr. Angelena Form and presents today for annual follow-up.   Patient first seen by [facility] in 2015 after presenting to the ED for chest pain and dyspnea. Stress Myoview showed apical defect. Cardiac catheterization in 2015 showed mild coronary plaque but no obstructive disease. LVEDP was elevated at that time and patient was treated for diastolic CHF. In 02/2017, patient reported several months of worsening dyspnea with exertion as well as fatigue and palpitations. Echo in 03/2017 showed LVEF of 55% with normal wall motion, grade 1 diastolic dysfunction, an no significant valvular abnormalities. Myoview in 03/2017 which was low risk with probable inferior thinning but mild inferior ischemia could not be ruled out. Continued medical therapy was recommended. 30 day Monitor showed sinus rhythm with one 4 beat run of SVT. He stopped his Losartan and symptoms resolved. Patient was last seen by Dr. Angelena Form in 03/2018 for follow-up at which time he was doing well and denied any cardiac symptoms. Plan was to follow-up in 1 year.   Most Recent Labs Reviewed: - 10/2018: Hgb 14.4, Plts 303. - 01/2019: Cr 1.01, K 4.7, AST 14, ALT 12, Alk Phos 84, Hgb A1c 10.0, LDL 75.  Patient presents today for follow-up. Patient doing well from a cardiac standpoint. He had a detached retina of left eye in 02/2019 requiring surgery but otherwise has not had any problems since last visit. 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 He has also lost about 13 lbs from last visit. He reports occasional mild dyspnea with significant exertion but states it has not gotten any worse. No chest pain, palpitations, lightheadedness, dizziness, syncope, orthopnea, PND, or lower extremity edema. BP mildly elevated at 147/87 today. However, patient states BP is better at home around 130/70.   Past Medical History:  Diagnosis Date  . Allergic rhinitis, cause unspecified   . Chronic diastolic CHF (congestive heart failure) ([place])   . Degeneration of intervertebral disc, site unspecified   . Diabetes mellitus   . Hyperlipidemia   . Hypertension   . Impotence of organic origin   . Mild CAD 2015  . Obesity, unspecified   . Obstructive sleep apnea (adult) (pediatric)   . Other chronic allergic conjunctivitis   . Personal history of colonic polyps     Past Surgical History:  Procedure Laterality Date  . admission  01/2009   [facility].  Syncopal event with nausea, diaphoresis, hypertension.  D-dimer elevated at 2.54 with negative CT chest. and VQ scan.  Cardiac enzymes negative x 3.  Labs normal.  Cardiology consult---cardiolite abn; cath, 2D-echo, holter monitor unrevealing.  SE H&V.  Marland Kitchen Allergy Consult  07/11/2010   SOB, itching, facial tingling, hypotension.  Allergy testing: no food allergies; +reaction to tree pollen, grass pollen, dust mites, mold.  Avoid diclofenac and similar products.  [name].  . APPENDECTOMY    . arthroscopy of knee  1980  . BACK SURGERY     x3  . COLONOSCOPY  07/11/2009   single polyp; repeat in 5 years.  [name].  . GI consult  07/11/2010   CT chest in ED (gastric thickening).  [name].  No EGD warranted due to lack of symptoms.  Marland Kitchen LAPAROSCOPIC GASTRIC BANDING  09/15/09  . LEFT HEART CATHETERIZATION WITH CORONARY ANGIOGRAM N/A 04/02/2014   Procedure: LEFT HEART CATHETERIZATION WITH CORONARY ANGIOGRAM;  Surgeon: [name], MD;  Location: [facility] CATH LAB;  Service: Cardiovascular;  Laterality: N/A;  . QUADRICEPS REPAIR     left  . QUADRICEPS TENDON REPAIR     left  . ROTATOR CUFF REPAIR     right  . Sleep study  07/11/2010   repeat sleep study due to weight loss.  Severe OSA; CPAP titration to 8 cm.    . SPINE SURGERY    . TONSILLECTOMY AND ADENOIDECTOMY  1960    Current Medications: Current Meds  Medication Sig  . albuterol (PROVENTIL HFA;VENTOLIN HFA) 108 (90 Base) MCG/ACT inhaler Inhale 1-2 puffs into the lungs every 6 (six) hours as needed for wheezing or shortness of breath.  Marland Kitchen aspirin EC 81 MG tablet Take 81 mg by mouth daily.  Marland Kitchen atorvastatin (LIPITOR) 40 MG tablet TAKE 1 TABLET BY MOUTH EVERY DAY  . B-D INS SYR ULTRAFINE 1CC/31G 31G X 5/16" 1 ML MISC USE FOR INSULIN INJECTIONS THREE TIMES DAILY  . cyclobenzaprine (FLEXERIL) 5 MG tablet Take 5 mg by mouth as needed for muscle spasms.  . Dulaglutide (TRULICITY) 1.5 YI/9.4WN SOPN INJECT 1.5 MG INTO THE SKIN ONCE WEEKLY IN AM  . empagliflozin (JARDIANCE) 25 MG TABS tablet Take 25 mg by mouth daily.  Marland Kitchen FLUoxetine (PROZAC) 20 MG tablet TAKE 1 TABLET BY MOUTH DAILY  . glucose blood (ONE TOUCH ULTRA TEST) test strip Test blood sugar 3 times daily. Dx code: E11.9. Insulin treated  . insulin aspart (NOVOLOG FLEXPEN) 100 UNIT/ML FlexPen Inject 20-25 Units into the skin 3 (three) times daily with meals.  . Insulin Detemir (LEVEMIR FLEXTOUCH) 100 UNIT/ML Pen Inject 52 Units into the skin daily.  . metFORMIN (GLUCOPHAGE) 1000 MG tablet Take 1 tablet (1,000 mg total) by mouth 2 (two) times daily with a meal.  . Multiple Vitamin (MULTIVITAMIN) capsule Take 1 capsule by mouth daily.  Marland Kitchen spironolactone (ALDACTONE) 25 MG tablet TAKE 1/2 TABLET BY MOUTH EVERY DAY     Allergies:   Diclofenac sodium, Voltaren [diclofenac sodium], Metoprolol, and Ramipril   Social History   Socioeconomic History  . Marital status: Married    Spouse name: Not on file  . Number  of children: 3  . Years of education: Not on file  . Highest education level: Not on file  Occupational History  . 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 Financial resource strain: Not on file  . Food insecurity    Worry: Not on file    Inability: Not on file  . Transportation needs    Medical: Not on file    Non-medical: Not on file  Tobacco Use  . Smoking status: Former Smoker    Packs/day: 0.25    Years: 2.00    Pack years: 0.50    Types: Cigarettes  . Smokeless tobacco: Never Used  Substance and Sexual Activity  . Alcohol use: Yes    Alcohol/week: 0.0 standard drinks  Comment: occasional Beer once every two weeks on the weekends  . Drug use: No  . Sexual activity: Yes  Lifestyle  . Physical activity    Days per week: Not on file    Minutes per session: Not on file  . Stress: Not on file  Relationships  . Social Herbalist on phone: Not on file    Gets together: Not on file    Attends religious service: Not on file    Active member of club or organization: Not on file    Attends meetings of clubs or organizations: Not on file    Relationship status: Not on file  Other Topics Concern  . Not on file  Social History Narrative   Married 1986       Children: 3 children, 3 grandchildren; 2 great grandkids      Lives: with wife, dog.      Employment: employed.      Tobacco: never      Alcohol: beer twice per month on weekends.      Drugs: none      Exercise: walking three days per week.      Seatbelt: 100% of time      Guns: loaded and secured/locked.      Sexual activity: sexually active with wife.      Sunscreen: yes.     Family History: The patient's family history includes Arthritis in his mother; Cancer in his father, maternal uncle, and paternal grandfather; Depression in his sister; Diabetes in his father, mother, and sister; Heart disease  (age of onset: 71) in his father; Hyperlipidemia in his father; Hypertension in his father; Kidney failure in his mother; Parkinson's disease in his father; Peripheral Artery Disease in his father; Prostate cancer in his maternal uncle. There is no history of Colon cancer.  ROS:   Please see the history of present illness.    All other systems reviewed and are negative.  EKGs/Labs/Other Studies Reviewed:    The following studies were reviewed today:  Echocardiogram 03/15/2017: Study Conclusions: - Left ventricle: The cavity size was normal. Wall thickness was   normal. The estimated ejection fraction was 55%. Wall motion was   normal; there were no regional wall motion abnormalities. Doppler   parameters are consistent with abnormal left ventricular   relaxation (grade 1 diastolic dysfunction). - Aortic valve: There was no stenosis. - Mitral valve: There was no significant regurgitation. - Right ventricle: The cavity size was normal. Systolic function   was normal. - Pulmonary arteries: No complete TR doppler jet so unable to   estimate PA systolic pressure. - Inferior vena cava: The vessel was normal in size. The   respirophasic diameter changes were in the normal range (= 50%),   consistent with normal central venous pressure.  Impressions: - Normal LV size with EF 55%. Normal RV size and systolic function.   No significant valvular abnormalities. _______________  Myoview 03/15/2017:  Nuclear stress EF: 56%.  There was no ST segment deviation noted during stress.  Defect 1: There is a small defect of moderate severity present in the basal inferior and mid inferior location.  This is a low risk study.  The left ventricular ejection fraction is normal (55-65%).   Low risk stress nuclear study with probable inferior thinning; cannot R/O mild inferior ischemia; EF 56 with normal wall motion.  _______________  Cardiac Monitor  03/23/2017 to 04/21/2017: Sinus rhythm One 4  beat run of non-sustained ventricular tachycardia  EKG:  EKG ordered today. EKG was personally reviewed and demonstrates normal sinus rhythm, rate 91 bpm, with no significant ST/T changes compared to prior tracings.  Recent Labs: No results found for requested labs within last 8760 hours.  Recent Lipid Panel    Component Value Date/Time   CHOL 138 01/18/2018 0810   TRIG 104.0 01/18/2018 0810   TRIG 88 07/24/2006 1009   HDL 42.00 01/18/2018 0810   CHOLHDL 3 01/18/2018 0810   VLDL 20.8 01/18/2018 0810   LDLCALC 75 01/18/2018 0810    Physical Exam:    Vital Signs:  BP (!) 145/87   Pulse 91   Wt 249 lb 12.8 oz (113.3 kg)   SpO2 97%   BMI 32.96 kg/m     Wt Readings from Last 3 Encounters:  04/23/19 249 lb 12.8 oz (113.3 kg)  09/21/18 251 lb 8 oz (114.1 kg)  04/02/18 262 lb (118.8 kg)    General: 58 y.o. obese Caucasian male in no acute distress. HEENT: Normocephalic and atraumatic. Mild injection of left sclera due to recent retinal detachment. Neck: Supple. No carotid bruits. No JVD. Heart: RRR. Distinct S1 and S2. No murmurs, gallops, or rubs. Radial pulses 2+ and equal bilaterally. Lungs: No increased work of breathing. Clear to ausculation bilaterally. No wheezes, rhonchi, or rales.  Abdomen: Soft, non-distended, and non-tender to palpation. Bowel sounds present in all 4 quadrants.  MSK: Normal strength and tone for age. Extremities: No lower extremity edema.    Skin: Warm and dry. Neuro: Alert and oriented x3. No focal deficits. Psych: Normal affect. Responds appropriately.  ASSESSMENT:    1. Coronary artery disease involving native coronary artery of native heart without angina pectoris   2. Chronic diastolic CHF    3. Essential hypertension   4. Dyslipidemia   5. Type 2 diabetes mellitus with obesity ([place])   6. Sleep apnea, unspecified type   7. Medication management    PLAN:    CAD - Mild CAD noted on cardiac cath in 2015. Most recent Myoview in 03/2017  showed probable inferior thinning but mild inferior ischemia could not be ruled out. Considered low risk overall. - Stable. No angina.  - Continue aspirin and statin.  Chronic Diastolic CHF - Most recent Echo from 03/2017 showed LVEF of 55% with normal wall motion, grade 1 diastolic dysfunction, an no significant valvular abnormalities. - Patient appears euvolemic on exam. Weight down 13 lbs from last visit. - Continue Spironolactone 12.5m daily.  Hypertension  - BP mildly elevated at 147/87 today. Heart rate also on the higher side at 91 bpm. Patient states BP is better at home. - Continue Spironolactone 12.53mdaily. - Have asked patient to keep BP and heart log at home for the next 2 weeks and send [place] MyChart message with the results. If BP elevated may need to increase Spironolactone or add additional medication. Patient has not tolerate Metoprolol or Ramipril in the past so may want try Amlodipine. Will check baseline renal function and potassium with CMET before making any changes.  Hyperlipidemia - Most recent lipid panel from 01/2018: Total Cholesterol 138, Triglycerides 104, HDL 42, LDL 75. - Continue Lipitor 4026maily.  - Patient not fasting today. He will come back next week for fasting lipid panel and CMET.  Diabetes Mellitus - Hemoglobin A1c 10.0 in 01/2018. - Managed by Endocrinology.  Sleep Apnea - Previously followed by PCP. Patient states he has not used his CPAP machine in years since gastric surgery and  weight loss.  - Discussed how uncontrolled sleep apnea can affect BP and heart rate.  - Patient may benefit from repeat sleep study. Advised patient to discuss this with PCP at next visit.  Disposition: Patient will return for routine labs next week (CBC, CMET, and lipids). Otherwise, can follow up with Dr. Angelena Form in 1 year.   Medication Adjustments/Labs and Tests Ordered: Current medicines are reviewed at length with the patient today.  Concerns regarding  medicines are outlined above.  Orders Placed This Encounter  Procedures  . Comp Met (CMET)  . CBC  . Lipid panel  . EKG 12-Lead   No orders of the defined types were placed in this encounter.   Patient Instructions  Medication Instructions:  Your physician recommends that you continue on your current medications as directed. Please refer to the Current Medication list given to you today.  If you need a refill on your cardiac medications before your next appointment, please call your pharmacy.   Lab work: 05/02/2019 COME FASTING:  LIPID, CMET, & CBC  If you have labs (blood work) drawn today and your tests are completely normal, you will receive your results only by: Marland Kitchen MyChart Message (if you have MyChart) OR . A paper copy in the mail If you have any lab test that is abnormal or we need to change your treatment, we will call you to review the results.  Testing/Procedures: None ordered  Follow-Up: At [facility], you and your health needs are our priority.  As part of our continuing mission to provide you with exceptional heart care, we have created designated Provider Care Teams.  These Care Teams include your primary Cardiologist (physician) and Advanced Practice Providers (APPs -  Physician Assistants and Nurse Practitioners) who all work together to provide you with the care you need, when you need it. You will need a follow up appointment in 12 months.  Please call our office 2 months in advance to schedule this appointment.  You may see Lauree Chandler, MD or one of the following Advanced Practice Providers on your designated Care Team:   [place], PA-C Melina Copa, PA-C . Ermalinda Barrios, PA-C  Any Other Special Instructions Will Be Listed Below (If Applicable).  Monitor your blood pressure for the next couple weeks, at least 1 X a day and send a mychart message with those readings.    Signed, Darreld Mclean, PA-C  04/23/2019 4:35 PM    [facility]

## 2019-04-22 ENCOUNTER — Encounter: Payer: Self-pay | Admitting: Physician Assistant

## 2019-04-23 ENCOUNTER — Other Ambulatory Visit: Payer: Self-pay

## 2019-04-23 ENCOUNTER — Ambulatory Visit: Payer: BC Managed Care – PPO | Admitting: Student

## 2019-04-23 ENCOUNTER — Encounter: Payer: Self-pay | Admitting: Physician Assistant

## 2019-04-23 VITALS — BP 145/87 | HR 91 | Wt 249.8 lb

## 2019-04-23 DIAGNOSIS — I2510 Atherosclerotic heart disease of native coronary artery without angina pectoris: Secondary | ICD-10-CM

## 2019-04-23 DIAGNOSIS — I10 Essential (primary) hypertension: Secondary | ICD-10-CM | POA: Diagnosis not present

## 2019-04-23 DIAGNOSIS — G4730 Sleep apnea, unspecified: Secondary | ICD-10-CM

## 2019-04-23 DIAGNOSIS — E785 Hyperlipidemia, unspecified: Secondary | ICD-10-CM | POA: Diagnosis not present

## 2019-04-23 DIAGNOSIS — I5032 Chronic diastolic (congestive) heart failure: Secondary | ICD-10-CM

## 2019-04-23 DIAGNOSIS — E1169 Type 2 diabetes mellitus with other specified complication: Secondary | ICD-10-CM

## 2019-04-23 DIAGNOSIS — E669 Obesity, unspecified: Secondary | ICD-10-CM

## 2019-04-23 DIAGNOSIS — Z79899 Other long term (current) drug therapy: Secondary | ICD-10-CM

## 2019-04-23 NOTE — Patient Instructions (Addendum)
Medication Instructions:  Your physician recommends that you continue on your current medications as directed. Please refer to the Current Medication list given to you today.  If you need a refill on your cardiac medications before your next appointment, please call your pharmacy.   Lab work: 05/02/2019 COME FASTING:  LIPID, CMET, & CBC  If you have labs (blood work) drawn today and your tests are completely normal, you will receive your results only by: Marland Kitchen MyChart Message (if you have MyChart) OR . A paper copy in the mail If you have any lab test that is abnormal or we need to change your treatment, we will call you to review the results.  Testing/Procedures: None ordered  Follow-Up: At [facility], you and your health needs are our priority.  As part of our continuing mission to provide you with exceptional heart care, we have created designated Provider Care Teams.  These Care Teams include your primary Cardiologist (physician) and Advanced Practice Providers (APPs -  Physician Assistants and Nurse Practitioners) who all work together to provide you with the care you need, when you need it. You will need a follow up appointment in 12 months.  Please call our office 2 months in advance to schedule this appointment.  You may see Lauree Chandler, MD or one of the following Advanced Practice Providers on your designated Care Team:   [place], PA-C Melina Copa, PA-C . Ermalinda Barrios, PA-C  Any Other Special Instructions Will Be Listed Below (If Applicable).  Monitor your blood pressure for the next couple weeks, at least 1 X a day and send a mychart message with those readings.

## 2019-05-02 ENCOUNTER — Other Ambulatory Visit: Payer: Self-pay

## 2019-05-02 ENCOUNTER — Other Ambulatory Visit: Payer: BC Managed Care – PPO | Admitting: *Deleted

## 2019-05-02 DIAGNOSIS — Z79899 Other long term (current) drug therapy: Secondary | ICD-10-CM

## 2019-05-02 DIAGNOSIS — I10 Essential (primary) hypertension: Secondary | ICD-10-CM

## 2019-05-02 DIAGNOSIS — I5032 Chronic diastolic (congestive) heart failure: Secondary | ICD-10-CM

## 2019-05-02 DIAGNOSIS — I2510 Atherosclerotic heart disease of native coronary artery without angina pectoris: Secondary | ICD-10-CM

## 2019-05-02 DIAGNOSIS — E785 Hyperlipidemia, unspecified: Secondary | ICD-10-CM

## 2019-05-02 LAB — COMPREHENSIVE METABOLIC PANEL
ALT: 15 IU/L (ref 0–44)
AST: 16 IU/L (ref 0–40)
Albumin/Globulin Ratio: 1.5 (ref 1.2–2.2)
Albumin: 4.3 g/dL (ref 3.8–4.9)
Alkaline Phosphatase: 120 IU/L — ABNORMAL HIGH (ref 39–117)
BUN/Creatinine Ratio: 15 (ref 10–24)
BUN: 14 mg/dL (ref 8–27)
Bilirubin Total: 0.4 mg/dL (ref 0.0–1.2)
CO2: 25 mmol/L (ref 20–29)
Calcium: 10.1 mg/dL (ref 8.6–10.2)
Chloride: 103 mmol/L (ref 96–106)
Creatinine, Ser: 0.96 mg/dL (ref 0.76–1.27)
GFR calc Af Amer: 99 mL/min/{1.73_m2} (ref >59)
GFR calc non Af Amer: 86 mL/min/{1.73_m2} (ref >59)
Globulin, Total: 2.8 g/dL (ref 1.5–4.5)
Glucose: 123 mg/dL — ABNORMAL HIGH (ref 65–99)
Potassium: 5.3 mmol/L — ABNORMAL HIGH (ref 3.5–5.2)
Sodium: 142 mmol/L (ref 134–144)
Total Protein: 7.1 g/dL (ref 6.0–8.5)

## 2019-05-02 LAB — CBC
Hematocrit: 45.1 % (ref 37.5–51.0)
Hemoglobin: 15.2 g/dL (ref 13.0–17.7)
MCH: 30.1 pg (ref 26.6–33.0)
MCHC: 33.7 g/dL (ref 31.5–35.7)
MCV: 89 fL (ref 79–97)
Platelets: 363 10*3/uL (ref 150–450)
RBC: 5.05 x10E6/uL (ref 4.14–5.80)
RDW: 12.8 % (ref 11.6–15.4)
WBC: 7.3 10*3/uL (ref 3.4–10.8)

## 2019-05-02 LAB — LIPID PANEL
Chol/HDL Ratio: 3 ratio (ref 0.0–5.0)
Cholesterol, Total: 146 mg/dL (ref 100–199)
HDL: 49 mg/dL (ref >39)
LDL Chol Calc (NIH): 84 mg/dL (ref 0–99)
Triglycerides: 65 mg/dL (ref 0–149)
VLDL Cholesterol Cal: 13 mg/dL (ref 5–40)

## 2019-05-03 ENCOUNTER — Telehealth: Payer: Self-pay

## 2019-05-03 DIAGNOSIS — E875 Hyperkalemia: Secondary | ICD-10-CM

## 2019-05-03 DIAGNOSIS — I2510 Atherosclerotic heart disease of native coronary artery without angina pectoris: Secondary | ICD-10-CM

## 2019-05-03 DIAGNOSIS — E785 Hyperlipidemia, unspecified: Secondary | ICD-10-CM

## 2019-05-03 MED ORDER — ATORVASTATIN CALCIUM 80 MG PO TABS: 80.0000 mg | ORAL_TABLET | Freq: Every day | ORAL | 3 refills | Status: DC

## 2019-05-03 NOTE — Telephone Encounter (Signed)
-----   Message from Darreld Mclean, [place] sent at 05/02/2019  9:55 PM EDT ----- Please notify patient of results:  Potassium is slightly high. Please advise patient to limit potassium rich foods. If he is taking any supplements/vitamins with potassium, he should stop. I would like him to come back for a repeat BMET in 1 week so that we can recheck his potassium. Alkaline Phosphatase (one of the liver enzymes) is slightly high as well but not clinically significant. His LDL (bad cholesterol) is 84 which is higher than last year. Given his history of diabetes and mild CAD, I would recommend increasing his Lipitor to 80mg  daily to aim for a LDL goal of <70. If patient is agreeable to this, will need to recheck fasting lipid panel and LFTs in 6-8 weeks.   Thank you!

## 2019-05-03 NOTE — Telephone Encounter (Signed)
Call placed to Pt.  Advised of K.  Advised to decrease potassium rich foods.  Pt does take a multivitamin occasionally. Advised to stop multivitamin.  Pt will come to office 10/30 for repeat BMP  Advised Pt to increase Lipitor to 80 mg daily.  Pt will take 2 of his 40 mg tablets.    Will repeat lipids and LFT's in 6-8 weeks.  Pt indicates understanding of all teaching and agreeable to changes and repeat lab work.  Will schedule.

## 2019-05-09 ENCOUNTER — Ambulatory Visit (INDEPENDENT_AMBULATORY_CARE_PROVIDER_SITE_OTHER): Payer: BC Managed Care – PPO

## 2019-05-09 DIAGNOSIS — Z23 Encounter for immunization: Secondary | ICD-10-CM | POA: Diagnosis not present

## 2019-05-10 ENCOUNTER — Other Ambulatory Visit: Payer: BC Managed Care – PPO | Admitting: *Deleted

## 2019-05-10 ENCOUNTER — Other Ambulatory Visit: Payer: Self-pay

## 2019-05-10 DIAGNOSIS — E875 Hyperkalemia: Secondary | ICD-10-CM

## 2019-05-10 LAB — BASIC METABOLIC PANEL
BUN/Creatinine Ratio: 17 (ref 10–24)
BUN: 19 mg/dL (ref 8–27)
CO2: 21 mmol/L (ref 20–29)
Calcium: 9.1 mg/dL (ref 8.6–10.2)
Chloride: 98 mmol/L (ref 96–106)
Creatinine, Ser: 1.13 mg/dL (ref 0.76–1.27)
GFR calc Af Amer: 81 mL/min/{1.73_m2} (ref >59)
GFR calc non Af Amer: 70 mL/min/{1.73_m2} (ref >59)
Glucose: 386 mg/dL — ABNORMAL HIGH (ref 65–99)
Potassium: 4.5 mmol/L (ref 3.5–5.2)
Sodium: 136 mmol/L (ref 134–144)

## 2019-05-15 ENCOUNTER — Telehealth: Payer: Self-pay

## 2019-05-15 MED ORDER — NEBIVOLOL HCL 2.5 MG PO TABS: 2.5000 mg | ORAL_TABLET | Freq: Every day | ORAL | 3 refills | Status: DC

## 2019-05-15 NOTE — Telephone Encounter (Signed)
Average BP slightly above goal of 130/80. Heart rate is also on the high side. I think patient would benefit from a little better control of both BP and heart rate. It looks like patient has not tolerated Metoprolol in the past and had lightheadedness/dizziness/shortness of breath with this. I would like to see if patient can tolerate Bystolic 2.5 mg daily though. Bystolic is in same class of medications as Metoprolol but sometimes tolerated better. If patient is willing, can we send it prescription of this and then have patient continue to monitor BP and heart rate for another 2 weeks. However if he has similar symptoms, he should let us know and we could try Cardizem.

## 2019-05-15 NOTE — Telephone Encounter (Signed)
I spoke to the patient with Callie's recommendation and he verbalized understanding.  He will start Bystolic 2.5 mg Daily, monitor BP/HR and symptoms, and will update [name] in 2 weeks.

## 2019-05-31 LAB — HM DIABETES EYE EXAM

## 2019-06-21 ENCOUNTER — Other Ambulatory Visit: Payer: BC Managed Care – PPO | Admitting: *Deleted

## 2019-06-21 ENCOUNTER — Other Ambulatory Visit: Payer: Self-pay

## 2019-06-21 DIAGNOSIS — E785 Hyperlipidemia, unspecified: Secondary | ICD-10-CM

## 2019-06-21 DIAGNOSIS — I2510 Atherosclerotic heart disease of native coronary artery without angina pectoris: Secondary | ICD-10-CM

## 2019-06-21 LAB — LIPID PANEL
Chol/HDL Ratio: 3.4 ratio (ref 0.0–5.0)
Cholesterol, Total: 142 mg/dL (ref 100–199)
HDL: 42 mg/dL (ref >39)
LDL Chol Calc (NIH): 80 mg/dL (ref 0–99)
Triglycerides: 111 mg/dL (ref 0–149)
VLDL Cholesterol Cal: 20 mg/dL (ref 5–40)

## 2019-06-21 LAB — HEPATIC FUNCTION PANEL
ALT: 20 IU/L (ref 0–44)
AST: 13 IU/L (ref 0–40)
Albumin: 4.3 g/dL (ref 3.8–4.9)
Alkaline Phosphatase: 105 IU/L (ref 39–117)
Bilirubin Total: 0.5 mg/dL (ref 0.0–1.2)
Bilirubin, Direct: 0.14 mg/dL (ref 0.00–0.40)
Total Protein: 7 g/dL (ref 6.0–8.5)

## 2019-06-25 ENCOUNTER — Other Ambulatory Visit: Payer: Self-pay | Admitting: Family Medicine

## 2019-06-25 ENCOUNTER — Telehealth: Payer: Self-pay | Admitting: *Deleted

## 2019-06-25 DIAGNOSIS — E785 Hyperlipidemia, unspecified: Secondary | ICD-10-CM

## 2019-06-25 DIAGNOSIS — I2510 Atherosclerotic heart disease of native coronary artery without angina pectoris: Secondary | ICD-10-CM

## 2019-06-25 NOTE — Telephone Encounter (Signed)
Pt has been notified of lab results by phone with verbal understanding. Pt agreeable to plan of care to repeat labs in 6 months 12/27/19. Pt advised continue working on diet and exercise. Pt thanked me for the call. Patient notified of result.  Please refer to phone note from today for complete details.   Julaine Hua, [place] 06/25/2019 9:02 AM

## 2019-06-25 NOTE — Telephone Encounter (Signed)
-----   Message from Nuala Alpha, LPN sent at [id number]  8:43 AM EST -----  ----- Message ----- From: Darreld Mclean, PA-C Sent: 06/24/2019   8:01 AM EST To: [facility] Triage  Please notify patient of results: Bad cholesterol (LDL) slightly improved from before but still above goal of <70. Would recommend patient continue to work on diet and and then can recheck lipid panel in 6 months.   Thank you! Callie

## 2019-06-29 ENCOUNTER — Other Ambulatory Visit: Payer: Self-pay | Admitting: Family Medicine

## 2019-06-29 DIAGNOSIS — E785 Hyperlipidemia, unspecified: Secondary | ICD-10-CM

## 2019-07-30 ENCOUNTER — Other Ambulatory Visit: Payer: Self-pay | Admitting: Family Medicine

## 2019-07-30 DIAGNOSIS — I5032 Chronic diastolic (congestive) heart failure: Secondary | ICD-10-CM

## 2019-07-31 ENCOUNTER — Encounter: Payer: Self-pay | Admitting: Family Medicine

## 2019-07-31 ENCOUNTER — Telehealth: Payer: Self-pay | Admitting: Family Medicine

## 2019-07-31 NOTE — Telephone Encounter (Signed)
Last office visit 09/21/2018 for Sinus congestion/headache.  Last refilled ?  List as historical on med list.  No future appointments.

## 2019-07-31 NOTE — Telephone Encounter (Signed)
See Refill request.  

## 2019-07-31 NOTE — Telephone Encounter (Signed)
Sent. Thanks.  Needs CPE when possible. 

## 2019-07-31 NOTE — Telephone Encounter (Signed)
Patient scheduled.

## 2019-07-31 NOTE — Telephone Encounter (Signed)
Please schedule when possible.  

## 2019-08-05 ENCOUNTER — Other Ambulatory Visit (INDEPENDENT_AMBULATORY_CARE_PROVIDER_SITE_OTHER): Payer: BC Managed Care – PPO

## 2019-08-05 ENCOUNTER — Other Ambulatory Visit: Payer: Self-pay

## 2019-08-05 ENCOUNTER — Other Ambulatory Visit: Payer: Self-pay | Admitting: Family Medicine

## 2019-08-05 DIAGNOSIS — E1165 Type 2 diabetes mellitus with hyperglycemia: Secondary | ICD-10-CM

## 2019-08-05 DIAGNOSIS — Z125 Encounter for screening for malignant neoplasm of prostate: Secondary | ICD-10-CM | POA: Diagnosis not present

## 2019-08-05 DIAGNOSIS — E1159 Type 2 diabetes mellitus with other circulatory complications: Secondary | ICD-10-CM

## 2019-08-05 LAB — COMPREHENSIVE METABOLIC PANEL
ALT: 14 U/L (ref 0–53)
AST: 14 U/L (ref 0–37)
Albumin: 3.9 g/dL (ref 3.5–5.2)
Alkaline Phosphatase: 107 U/L (ref 39–117)
BUN: 18 mg/dL (ref 6–23)
CO2: 29 mEq/L (ref 19–32)
Calcium: 9.4 mg/dL (ref 8.4–10.5)
Chloride: 103 mEq/L (ref 96–112)
Creatinine, Ser: 0.87 mg/dL (ref 0.40–1.50)
GFR: 89.42 mL/min (ref >60.00)
Glucose, Bld: 108 mg/dL — ABNORMAL HIGH (ref 70–99)
Potassium: 4.8 mEq/L (ref 3.5–5.1)
Sodium: 141 mEq/L (ref 135–145)
Total Bilirubin: 0.5 mg/dL (ref 0.2–1.2)
Total Protein: 6.8 g/dL (ref 6.0–8.3)

## 2019-08-05 LAB — LIPID PANEL
Cholesterol: 143 mg/dL (ref 0–200)
HDL: 43.6 mg/dL (ref >39.00)
LDL Cholesterol: 79 mg/dL (ref 0–99)
NonHDL: 99.5
Total CHOL/HDL Ratio: 3
Triglycerides: 104 mg/dL (ref 0.0–149.0)
VLDL: 20.8 mg/dL (ref 0.0–40.0)

## 2019-08-05 LAB — MICROALBUMIN / CREATININE URINE RATIO
Creatinine,U: 152.1 mg/dL
Microalb Creat Ratio: 0.8 mg/g (ref 0.0–30.0)
Microalb, Ur: 1.2 mg/dL (ref 0.0–1.9)

## 2019-08-05 LAB — PSA: PSA: 0.24 ng/mL (ref 0.10–4.00)

## 2019-08-05 LAB — HEMOGLOBIN A1C: Hgb A1c MFr Bld: 14.2 % — ABNORMAL HIGH (ref 4.6–6.5)

## 2019-08-05 NOTE — Addendum Note (Signed)
Addended by: Alvina Chou on: 08/05/2019 08:40 AM   Modules accepted: Orders

## 2019-08-12 ENCOUNTER — Encounter: Payer: Self-pay | Admitting: Family Medicine

## 2019-08-12 ENCOUNTER — Other Ambulatory Visit: Payer: Self-pay

## 2019-08-12 ENCOUNTER — Ambulatory Visit (INDEPENDENT_AMBULATORY_CARE_PROVIDER_SITE_OTHER): Payer: BC Managed Care – PPO | Admitting: Family Medicine

## 2019-08-12 VITALS — BP 122/62 | HR 65 | Temp 96.7°F | Ht 73.0 in | Wt 247.0 lb

## 2019-08-12 DIAGNOSIS — E1159 Type 2 diabetes mellitus with other circulatory complications: Secondary | ICD-10-CM

## 2019-08-12 DIAGNOSIS — Z659 Problem related to unspecified psychosocial circumstances: Secondary | ICD-10-CM

## 2019-08-12 DIAGNOSIS — I10 Essential (primary) hypertension: Secondary | ICD-10-CM

## 2019-08-12 DIAGNOSIS — Z0000 Encounter for general adult medical examination without abnormal findings: Secondary | ICD-10-CM | POA: Diagnosis not present

## 2019-08-12 DIAGNOSIS — Z7189 Other specified counseling: Secondary | ICD-10-CM

## 2019-08-12 DIAGNOSIS — E785 Hyperlipidemia, unspecified: Secondary | ICD-10-CM

## 2019-08-12 DIAGNOSIS — E1165 Type 2 diabetes mellitus with hyperglycemia: Secondary | ICD-10-CM

## 2019-08-12 NOTE — Progress Notes (Signed)
This visit occurred during the SARS-CoV-2 public health emergency.  Safety protocols were in place, including screening questions prior to the visit, additional usage of staff PPE, and extensive cleaning of exam room while observing appropriate contact time as indicated for disinfecting solutions.  CPE- See plan.  Routine anticipatory guidance given to patient.  See health maintenance.  The possibility exists that previously documented standard health maintenance information may have been brought forward from a previous encounter into this note.  If needed, that same information has been updated to reflect the current situation based on today's encounter.    Tetanus 2012 Flu 2020 PNA  2016 Shingles d/w pt.   covid vaccine d/w pt.  Colon cancer screening d/w pt.  He'll call about follow up.   PSA wnl.  Living will d/w pt.  Wife designated if patient were incapacitated.    Elevated Cholesterol: Using medications without problems: yes Muscle aches: no Diet compliance: d/w pt.   Exercise: d/w pt.   Labs d/w pt.   Hypertension:    Using medication without problems or lightheadedness: yes Chest pain with exertion:no Edema:no, resolved with weight loss.   Short of breath: rare use of SABA, with relief.   Labs d/w pt.   Weight loss noted. He had lab band surgery.  He is down from 400lbs.   Diabetes:  D/w pt about f/u with endo.   Using medications without difficulties: yes Hypoglycemic episodes:no Hyperglycemic episodes: up to 200s.  Feet problems: not a new issue, he has sx related to his back at baseline.   Blood Sugars averaging: 130-140 in the AMs, higher in the PM eye exam within last year: yes, he had cataract surgery and detached retina.  MALB neg. D/w pt.    His wife has ALS, he is caring for his parents.  Wife can still walk some with ongoing therapy.  He has hardware/ramps at home to help.  Mood d/w pt.  Taking prozac at baseline, with relief.  No SI/HI.    PMH and SH  reviewed  Meds, vitals, and allergies reviewed.   ROS: Per HPI.  Unless specifically indicated otherwise in HPI, the patient denies:  General: fever. Eyes: acute vision changes ENT: sore throat Cardiovascular: chest pain Respiratory: SOB GI: vomiting GU: dysuria Musculoskeletal: acute back pain Derm: acute rash Neuro: acute motor dysfunction Psych: worsening mood Endocrine: polydipsia Heme: bleeding Allergy: hayfever  GEN: nad, alert and oriented HEENT: ncat NECK: supple w/o LA CV: rrr. PULM: ctab, no inc wob ABD: soft, +bs EXT: no edema SKIN: no acute rash  Diabetic foot exam: Normal inspection No skin breakdown No calluses  Normal DP pulses Dec sensation to light touch and monofilament Nails normal except for B 1st nails absent.

## 2019-08-12 NOTE — Patient Instructions (Addendum)
Check with your insurance to see if they will cover the shingles shot. Call about follow up with the GI clinic when possible.   Call about endocrine follow up.  I'll update Dr. Elvera [name].  Take care.  Glad to see you. Update me as needed.

## 2019-08-14 DIAGNOSIS — Z0000 Encounter for general adult medical examination without abnormal findings: Secondary | ICD-10-CM | POA: Insufficient documentation

## 2019-08-14 NOTE — Assessment & Plan Note (Signed)
Blood Sugars averaging: 130-140 in the AMs, higher in the PM eye exam within last year: yes, he had cataract surgery and detached retina.  MALB neg. D/w pt.   He has follow-up with endocrinology pending. I need endocrinology input given the discrepancy between his relatively low fasting sugars in the morning, 130-140 and his significantly elevated A1c.

## 2019-08-14 NOTE — Assessment & Plan Note (Signed)
Lipids nearly at goal.  Continue work on diet and exercise and weight loss.  Continue statin.  Labs discussed with patient.  He agrees.

## 2019-08-14 NOTE — Assessment & Plan Note (Signed)
Continue work on diet and exercise and weight loss.  Continue current meds.  Labs discussed with patient.  He agrees.

## 2019-08-14 NOTE — Assessment & Plan Note (Signed)
Tetanus 2012 Flu 2020 PNA  2016 Shingles d/w pt.   covid vaccine d/w pt.  Colon cancer screening d/w pt.  He'll call about follow up.   PSA wnl.  Living will d/w pt.  Wife designated if patient were incapacitated.

## 2019-08-14 NOTE — Assessment & Plan Note (Signed)
Living will d/w pt.  Wife designated if patient were incapacitated.   ?

## 2019-08-14 NOTE — Assessment & Plan Note (Addendum)
His wife has ALS, he is caring for his parents.  Wife can still walk some with ongoing therapy.  He has hardware/ramps at home to help.  Mood d/w pt.  Taking prozac at baseline, with relief.  No SI/HI.   Would continue as is with Prozac.  He will update me as needed.

## 2019-09-02 ENCOUNTER — Ambulatory Visit: Payer: BC Managed Care – PPO | Attending: Internal Medicine

## 2019-09-02 DIAGNOSIS — Z23 Encounter for immunization: Secondary | ICD-10-CM

## 2019-09-02 NOTE — Progress Notes (Signed)
   Covid-19 Vaccination Clinic  Name:  [name]    MRN: [id number] DOB: [birth year]  09/02/2019  [name] was observed post Covid-19 immunization for 15 minutes without incidence. He was provided with Vaccine Information Sheet and instruction to access the V-Safe system.   [name] was instructed to call 911 with any severe reactions post vaccine: Marland Kitchen Difficulty breathing  . Swelling of your face and throat  . A fast heartbeat  . A bad rash all over your body  . Dizziness and weakness    Immunizations Administered    Name Date Dose VIS Date Route   Moderna COVID-19 Vaccine 09/02/2019 12:52 PM 0.5 mL 06/11/2019 Intramuscular   Manufacturer: Moderna   Lot: [id number]   NDC: 46950-722-57

## 2019-09-17 ENCOUNTER — Other Ambulatory Visit: Payer: Self-pay

## 2019-09-19 ENCOUNTER — Encounter: Payer: Self-pay | Admitting: Internal Medicine

## 2019-09-19 ENCOUNTER — Other Ambulatory Visit: Payer: Self-pay

## 2019-09-19 ENCOUNTER — Ambulatory Visit: Payer: BC Managed Care – PPO | Admitting: Internal Medicine

## 2019-09-19 VITALS — BP 120/68 | HR 87 | Ht 73.0 in | Wt 247.0 lb

## 2019-09-19 DIAGNOSIS — E1159 Type 2 diabetes mellitus with other circulatory complications: Secondary | ICD-10-CM | POA: Diagnosis not present

## 2019-09-19 DIAGNOSIS — E669 Obesity, unspecified: Secondary | ICD-10-CM | POA: Diagnosis not present

## 2019-09-19 DIAGNOSIS — E1165 Type 2 diabetes mellitus with hyperglycemia: Secondary | ICD-10-CM

## 2019-09-19 DIAGNOSIS — E785 Hyperlipidemia, unspecified: Secondary | ICD-10-CM | POA: Diagnosis not present

## 2019-09-19 MED ORDER — TRULICITY 3 MG/0.5ML ~~LOC~~ SOAJ: 3.0000 mg | SUBCUTANEOUS | 3 refills | Status: DC

## 2019-09-19 NOTE — Patient Instructions (Signed)
Please continue: - Metformin 1000 mg 2X a day with meals - Jardiance 25 mg before breakfast - Levemir 52 units at night  Please increase: - Trulicity 3 mg weekly in a.m.  Please restart: - NovoLog 20-25 units 3X a day, before each meal  Please let me know if sugars remain high after restarting NovoLog before each meal.  Check some sugars later in the day.  Please return in 3 months with your sugar log.

## 2019-09-19 NOTE — Progress Notes (Signed)
Patient ID: [name], male   DOB: 08/18/1960, 59 y.o.   MRN: [id number]   This visit occurred during the SARS-CoV-2 public health emergency.  Safety protocols were in place, including screening questions prior to the visit, additional usage of staff PPE, and extensive cleaning of exam room while observing appropriate contact time as indicated for disinfecting solutions.   HPI: [name] is a 59 y.o.-year-old male, presenting for f/u for DM2, dx in 1993, insulin-dependent, uncontrolled since ~2005, with complications (CAD, CHF, gastroparesis, ED). Last visit 11 months ago (virtual).   He has been very stressed lately - Wife has just been dx'ed with ALS.  Reviewed HbA1c levels: Lab Results  Component Value Date   HGBA1C 14.2 (H) 08/05/2019   HGBA1C 10.0 (H) 01/18/2018   HGBA1C 11.6 (H) 09/27/2017  03/07/2017: HbA1c calculated from the fructosamine 10%  Pt is on a regimen of: - Metformin 1000 mg 2X a day with meals - Jardiance 25 mg before breakfast - Trulicity 1.5 mg weekly in a.m. - Levemir 60 >> 52 units in HS >> in a.m. >> at night - NovoLog 20-25 units 3X a day, before each meal >> not taking this more than maybe once a day He also tried other oral meds >> cannot remember name.  Pt checks his sugars 1-2x a day: - am:  71-135, 151, 165 >> 73, 121-130, 217 >> 107-168, 200, 217 - 2h after b'fast:98-203 >> 143-181 >> 143-258 >> n/c >> 202-295 - before lunch: 145-206 >> 145-238 >> 130-191 >> 180-241 - 2h after lunch: 3-5 (meds late) >> n/c >> 352 >> n/c - before dinner:205-358 >> 259-313 >> 220-241 >> n/c - 2h after dinner:  300-412 >> 248 >> n/c >> 95 >> n/c - bedtime:  210, 244, 278 >> 210-278 >> 111-200 >> n/c - nighttime: n/c >> 68-82 >> n/c Lowest sugar was 60 at 3 am >> 73 >> 107; he has hypoglycemia awareness in the 90s. Highest sugar was 316 (sick, no insulin) >> 220 >> 295.  Glucometer: One Touch Ultra   Pt's meals are: - Breakfast (usu. 30 units): sausage, 1 egg,  bacon, rarely bisquit; cereals - Lunch (usu. 20 units): soup, sandwich - when working: fast food  - Dinner (usu. 30 units): meat/fish + vegetable, including sweet potatoes + occasionally: starch - Snacks: no dessert usually, cookies, crackers; diet soda, occasionally regular sodas, occasionally milk  He has a history of lap band surgery in 2012.  He has CKD, last BUN/creatinine:  Lab Results  Component Value Date   BUN 18 08/05/2019   BUN 19 05/10/2019   CREATININE 0.87 08/05/2019   CREATININE 1.13 05/10/2019  He is off losartan and valsartan due to shortness of breath, sweating, dizziness.  On metoprolol. + HL; last set of lipids: Lab Results  Component Value Date   CHOL 143 08/05/2019   HDL 43.60 08/05/2019   LDLCALC 79 08/05/2019   TRIG 104.0 08/05/2019   CHOLHDL 3 08/05/2019  On Lipitor. - last eye exam was in 01/2019: retina detachment >> had sx. He also had cataract Sx. -He has numbness in his legs after his back surgery.  He had NCT. On ASA 81.  He had RMSF in 2012.  ROS: Constitutional: no weight gain/no weight loss, no fatigue, no subjective hyperthermia, no subjective hypothermia Eyes: no blurry vision, no xerophthalmia ENT: no sore throat, no nodules palpated in neck, no dysphagia, no odynophagia, no hoarseness Cardiovascular: no CP/no SOB/no palpitations/no leg swelling Respiratory: no  cough/no SOB/no wheezing Gastrointestinal: no N/no V/no D/no C/no acid reflux Musculoskeletal: no muscle aches/no joint aches Skin: no rashes, no hair loss Neurological: no tremors/no numbness/no tingling/no dizziness  I reviewed pt's medications, allergies, PMH, social hx, family hx, and changes were documented in the history of present illness. Otherwise, unchanged from my initial visit note.  Past Medical History:  Diagnosis Date  . Allergic rhinitis, cause unspecified   . Chronic diastolic CHF (congestive heart failure) ([place])   . Degeneration of intervertebral disc, site  unspecified   . Diabetes mellitus   . Hyperlipidemia   . Hypertension   . Impotence of organic origin   . Mild CAD 2015  . Obesity, unspecified   . Obstructive sleep apnea (adult) (pediatric)   . Other chronic allergic conjunctivitis   . Personal history of colonic polyps    Past Surgical History:  Procedure Laterality Date  . admission  01/2009   [facility].  Syncopal event with nausea, diaphoresis, hypertension.  D-dimer elevated at 2.54 with negative CT chest. and VQ scan.  Cardiac enzymes negative x 3.  Labs normal.  Cardiology consult---cardiolite abn; cath, 2D-echo, holter monitor unrevealing.  SE H&V.  Marland Kitchen Allergy Consult  07/11/2010   SOB, itching, facial tingling, hypotension.  Allergy testing: no food allergies; +reaction to tree pollen, grass pollen, dust mites, mold.  Avoid diclofenac and similar products.  [name].  . APPENDECTOMY    . arthroscopy of knee  1980  . BACK SURGERY     x3  . CATARACT EXTRACTION Left   . COLONOSCOPY  07/11/2009   single polyp; repeat in 5 years.  [name].  . GI consult  07/11/2010   CT chest in ED (gastric thickening).  [name].  No EGD warranted due to lack of symptoms.  Marland Kitchen LAPAROSCOPIC GASTRIC BANDING  09/15/09  . LEFT HEART CATHETERIZATION WITH CORONARY ANGIOGRAM N/A 04/02/2014   Procedure: LEFT HEART CATHETERIZATION WITH CORONARY ANGIOGRAM;  Surgeon: [name], MD;  Location: [facility] C[facility];  Service: Cardiovascular;  Laterality: N/A;  . QUADRICEPS REPAIR     left  . QUADRICEPS TENDON REPAIR     left  . RETINAL DETACHMENT SURGERY    . ROTATOR CUFF REPAIR     right  . Sleep study  07/11/2010   repeat sleep study due to weight loss.  Severe OSA; CPAP titration to 8 cm.    . SPINE SURGERY    . TONSILLECTOMY AND ADENOIDECTOMY  1960   Social History   Social History  . Marital status: Married    Spouse name: N/A  . Number of children: 3  . Years of education: N/A   Occupational History  . 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 Smoking status: Former Smoker, quit in 1984    Packs/day: 0.25    Years: 2.00    Types: Cigarettes  . Smokeless tobacco: Never Used  . Alcohol use 0.0 oz/week     Comment: occasional Beer once every two weeks on the weekends  . Drug use: No   Social History Narrative   Married 1986       Children: 3 children, 3 grandchildren; 2 great grandkids      Lives: with wife, dog.      Alcohol: beer twice per month on weekends.      Drugs: none      Exercise: walking three days per week.  Current Outpatient Medications on File Prior to Visit  Medication Sig Dispense Refill  . albuterol (PROVENTIL HFA;VENTOLIN HFA) 108 (90 Base) MCG/ACT inhaler Inhale 1-2 puffs into the lungs every 6 (six) hours as needed for wheezing or shortness of breath. 1 Inhaler 1  . aspirin EC 81 MG tablet Take 81 mg by mouth daily.    Marland Kitchen atorvastatin (LIPITOR) 80 MG tablet Take 1 tablet (80 mg total) by mouth daily. 90 tablet 3  . B-D INS SYR ULTRAFINE 1CC/31G 31G X 5/16" 1 ML MISC USE FOR INSULIN INJECTIONS THREE TIMES DAILY 100 each 5  . cyclobenzaprine (FLEXERIL) 5 MG tablet TAKE 1 TABLET BY MOUTH EVERYDAY AT BEDTIME 30 tablet 1  . Dulaglutide (TRULICITY) 1.5 MG/0.5ML SOPN INJECT 1.5 MG INTO THE SKIN ONCE WEEKLY IN AM 12 pen 3  . empagliflozin (JARDIANCE) 25 MG TABS tablet Take 25 mg by mouth daily. 30 tablet 11  . FLUoxetine (PROZAC) 20 MG tablet TAKE 1 TABLET BY MOUTH DAILY 90 tablet 1  . glucose blood (ONE TOUCH ULTRA TEST) test strip Test blood sugar 3 times daily. Dx code: E11.9. Insulin treated 300 each 3  . insulin aspart (NOVOLOG FLEXPEN) 100 UNIT/ML FlexPen Inject 20-25 Units into the skin 3 (three) times daily with meals. 15 pen 3  . Insulin Detemir (LEVEMIR FLEXTOUCH) 100 UNIT/ML Pen Inject 52 Units into the skin daily. 45 mL 3  . metFORMIN (GLUCOPHAGE) 1000 MG tablet Take 1 tablet (1,000 mg total) by mouth 2 (two) times daily with a  meal. 180 tablet 2  . Multiple Vitamin (MULTIVITAMIN) capsule Take 1 capsule by mouth daily.    . nebivolol (BYSTOLIC) 2.5 MG tablet Take 1 tablet (2.5 mg total) by mouth daily. 90 tablet 3  . spironolactone (ALDACTONE) 25 MG tablet TAKE 1/2 TABLET BY MOUTH EVERY DAY 45 tablet 0   No current facility-administered medications on file prior to visit.   Allergies  Allergen Reactions  . Diclofenac Sodium Shortness Of Breath, Itching and Other (See Comments)    Hypotension   . Voltaren [Diclofenac Sodium] Anaphylaxis  . Metoprolol     Lightheaded/dizzy/SOB.    . Ramipril     Lightheaded/dizzy/SOB.     Family History  Problem Relation Age of Onset  . Cancer Father        skin  . Heart disease Father 29       AMI first age 77; stents.  . Diabetes Father   . Hypertension Father   . Hyperlipidemia Father   . Peripheral Artery Disease Father   . Parkinson's disease Father   . Cancer Paternal Grandfather        skin  . Diabetes Mother   . Arthritis Mother        total hip replacement  . Kidney failure Mother   . Depression Sister   . Diabetes Sister   . Cancer Maternal Uncle        lymphoma  . Prostate cancer Maternal Uncle   . Colon cancer Neg Hx    PE: BP 120/68   Pulse 87   Ht 6\' 1"  (1.854 m)   Wt 247 lb (112 kg)   SpO2 98%   BMI 32.59 kg/m  Wt Readings from Last 3 Encounters:  09/19/19 247 lb (112 kg)  08/12/19 247 lb (112 kg)  04/23/19 249 lb 12.8 oz (113.3 kg)   Constitutional: overweight, in NAD Eyes: PERRLA, EOMI, no exophthalmos ENT: moist mucous membranes, no thyromegaly, no cervical lymphadenopathy Cardiovascular: RRR, No  MRG Respiratory: CTA B Gastrointestinal: abdomen soft, NT, ND, BS+ Musculoskeletal: no deformities, strength intact in all 4 Skin: moist, warm, no rashes Neurological: no tremor with outstretched hands, DTR normal in all 4  ASSESSMENT: 1. DM2, insulin-dependent, uncontrolled, with complications - CAD - CHF - gastroparesis -  ED  2. HL  3. Obesity class 1 BMI Classification:  < 18.5 underweight   18.5-24.9 normal weight   25.0-29.9 overweight   30.0-34.9 class I obesity   35.0-39.9 class II obesity   ? 40.0 class III obesity   PLAN:  1. Patient with longstanding, uncontrolled diabetes, on oral antidiabetic regimen and also basal-bolus insulin regimen, with still extremely poor diabetes control.  He is not usually compliant with visits.  Latest visit was 11 months ago and this was a virtual visit. At that time, sugars were slightly better but they were still high before lunch and before dinner.  I advised him to use higher doses of NovoLog but we did not change the regimen otherwise -At this he had a very high HbA1c at last visit PCP, at 14.2%, increased from 10%! -At this visit, we reviewed together his sugar log.  He is checking sugars only in the morning and occasionally after breakfast and before lunch, but not later in the day.  The sugars increase significantly from before to after breakfast.  This is most likely due to the fact that he is not taking NovoLog almost at all.  We discussed that this is not conducive to any type of diabetes control as reflected in his HbA1c.  He absolutely needs to restart taking his NovoLog.  He agrees to start doing so.  In the meantime, I also advised him to increase Trulicity to the 3 mg dose.  I advised him to get in touch with me to let me know about his sugars if they remain high after starting taking NovoLog consistently.  I also advised him to start checking some sugars later in the day. - I suggested to:  Patient Instructions  Please continue: - Metformin 1000 mg 2X a day with meals - Jardiance 25 mg before breakfast - Levemir 52 units at night  Please increase: - Trulicity 3 mg weekly in a.m.  Please restart: - NovoLog 20-25 units 3X a day, before each meal  Please let me know if sugars remain high after restarting NovoLog before each meal.  Check some  sugars later in the day.  Please return in 3 months with your sugar log. . - advised to check sugars at different times of the day - 4x a day, rotating check times - advised for yearly eye exams >> he is UTD - return to clinic in 3 months   2. HL -Reviewed latest lipid panel from 2021: LDL above target of <70, the rest of the fractions at Lab Results  Component Value Date   CHOL 143 08/05/2019   HDL 43.60 08/05/2019   LDLCALC 79 08/05/2019   TRIG 104.0 08/05/2019   CHOLHDL 3 08/05/2019  -Continues Lipitor without side effects.  3. Obesity class 1 -He previously lost 40 pounds due to glucose toxicity, but his weight plateaued afterwards, with minimum changes;  -Continue Trulicity and Jardiance, which should also help with weight loss -since last in person visit, he lost 17 lbs, possibly due to glucotoxicity  Carlus Pavlov, MD PhD [facility] Endocrinology

## 2019-10-01 ENCOUNTER — Ambulatory Visit: Payer: BC Managed Care – PPO | Attending: Internal Medicine

## 2019-10-01 DIAGNOSIS — Z23 Encounter for immunization: Secondary | ICD-10-CM

## 2019-10-01 NOTE — Progress Notes (Signed)
   Covid-19 Vaccination Clinic  Name:  [name]    MRN: [id number] DOB: 1960/10/21  10/01/2019  [name] was observed post Covid-19 immunization for 15 minutes without incident. He was provided with Vaccine Information Sheet and instruction to access the V-Safe system.   [name] was instructed to call 911 with any severe reactions post vaccine: Marland Kitchen Difficulty breathing  . Swelling of face and throat  . A fast heartbeat  . A bad rash all over body  . Dizziness and weakness   Immunizations Administered    Name Date Dose VIS Date Route   Moderna COVID-19 Vaccine 10/01/2019  2:49 PM 0.5 mL 06/11/2019 Intramuscular   Manufacturer: Gala Murdoch   Lot: [id number]   NDC: 68387-065-82

## 2019-10-21 ENCOUNTER — Encounter: Payer: Self-pay | Admitting: Family Medicine

## 2019-10-21 NOTE — Progress Notes (Signed)
[facility]/thx dmf 

## 2019-10-24 ENCOUNTER — Other Ambulatory Visit: Payer: Self-pay | Admitting: Family Medicine

## 2019-10-24 DIAGNOSIS — I5032 Chronic diastolic (congestive) heart failure: Secondary | ICD-10-CM

## 2019-10-28 ENCOUNTER — Other Ambulatory Visit: Payer: Self-pay | Admitting: Internal Medicine

## 2019-11-06 ENCOUNTER — Encounter: Payer: Self-pay | Admitting: Family Medicine

## 2019-11-06 MED ORDER — GLUCOSE BLOOD VI STRP: ORAL_STRIP | 3 refills | Status: DC

## 2019-11-12 ENCOUNTER — Other Ambulatory Visit: Payer: Self-pay | Admitting: Internal Medicine

## 2019-11-13 ENCOUNTER — Other Ambulatory Visit: Payer: Self-pay | Admitting: Internal Medicine

## 2019-11-21 ENCOUNTER — Telehealth: Payer: BC Managed Care – PPO | Admitting: Family

## 2019-11-21 DIAGNOSIS — B9689 Other specified bacterial agents as the cause of diseases classified elsewhere: Secondary | ICD-10-CM

## 2019-11-21 DIAGNOSIS — J019 Acute sinusitis, unspecified: Secondary | ICD-10-CM | POA: Diagnosis not present

## 2019-11-21 MED ORDER — AMOXICILLIN-POT CLAVULANATE 875-125 MG PO TABS: 1.0000 | ORAL_TABLET | Freq: Two times a day (BID) | ORAL | 0 refills | Status: DC

## 2019-11-21 NOTE — Progress Notes (Signed)

## 2019-12-15 ENCOUNTER — Other Ambulatory Visit: Payer: Self-pay | Admitting: Family Medicine

## 2019-12-24 ENCOUNTER — Other Ambulatory Visit: Payer: Self-pay

## 2019-12-24 ENCOUNTER — Encounter: Payer: Self-pay | Admitting: Internal Medicine

## 2019-12-24 ENCOUNTER — Ambulatory Visit: Payer: BC Managed Care – PPO | Admitting: Internal Medicine

## 2019-12-24 VITALS — BP 118/76 | HR 86 | Ht 73.0 in | Wt 264.0 lb

## 2019-12-24 DIAGNOSIS — Z6833 Body mass index (BMI) 33.0-33.9, adult: Secondary | ICD-10-CM

## 2019-12-24 DIAGNOSIS — E1165 Type 2 diabetes mellitus with hyperglycemia: Secondary | ICD-10-CM | POA: Diagnosis not present

## 2019-12-24 DIAGNOSIS — E669 Obesity, unspecified: Secondary | ICD-10-CM | POA: Diagnosis not present

## 2019-12-24 DIAGNOSIS — E785 Hyperlipidemia, unspecified: Secondary | ICD-10-CM | POA: Diagnosis not present

## 2019-12-24 DIAGNOSIS — E1159 Type 2 diabetes mellitus with other circulatory complications: Secondary | ICD-10-CM | POA: Diagnosis not present

## 2019-12-24 LAB — POCT GLYCOSYLATED HEMOGLOBIN (HGB A1C): Hemoglobin A1C: 10 % — AB (ref 4.0–5.6)

## 2019-12-24 NOTE — Addendum Note (Signed)
Addended by: Darliss Ridgel I on: 12/24/2019 11:00 AM   Modules accepted: Orders

## 2019-12-24 NOTE — Patient Instructions (Addendum)
Please continue: - Metformin 1000 mg 2X a day with meals - Jardiance 25 mg before breakfast - Trulicity 3 mg weekly in a.m. - Levemir 52 units at night - NovoLog 20-25 units 3X a day, before each meal  Try not to snack in the pm.  Please return in 3 months with your sugar log.

## 2019-12-24 NOTE — Progress Notes (Signed)
Patient ID: [name], male   DOB: Jan 22, 1961, 59 y.o.   MRN: [id number]   This visit occurred during the SARS-CoV-2 public health emergency.  Safety protocols were in place, including screening questions prior to the visit, additional usage of staff PPE, and extensive cleaning of exam room while observing appropriate contact time as indicated for disinfecting solutions.    HPI: [name] is a 59 y.o.-year-old male, presenting for f/u for DM2, dx in 1993, insulin-dependent, uncontrolled since ~2005, with complications (CAD, CHF, gastroparesis, ED). Last visit 3 months ago.   At last visit, he was very stressed as his wife was just diagnosed with ALS.  Since last visit, he was made a better job taking his NovoLog as he is staying more at home with his wife.  Reviewed previous HbA1c levels: Lab Results  Component Value Date   HGBA1C 14.2 (H) 08/05/2019   HGBA1C 10.0 (H) 01/18/2018   HGBA1C 11.6 (H) 09/27/2017  03/07/2017: HbA1c calculated from the fructosamine 10%  Pt is on a regimen of: - Metformin 1000 mg 2X a day with meals - Jardiance 25 mg before breakfast - Trulicity 1.5 >> 3 mg weekly in a.m. - Levemir 60 >> 52 units in HS >> in a.m. >> at night - NovoLog 20-25 units 3X a day, before each meal >> restarted 09/2019, still missing some doses He also tried other oral meds >> cannot remember name.  Pt checks his sugars 1-2 times a day: - am:  73, 121-130, 217 >> 107-168, 200, 217 >> 93-180, 200, 218 - 2h after b'fast: 143-181 >> 143-258 >> n/c >> 202-295 >> 69, 164 - before lunch: 145-238 >> 130-191 >> 180-241 >> 72-229 - 2h after lunch:  n/c >> 352 >> n/c >> 75, 193 - before dinner: 259-313 >> 220-241 >> n/c >> 145, 184-274, 300 - 2h after dinner: 248 >> n/c >> 95 >> n/c >> 99-151 - bedtime: 210-278 >> 111-200 >> n/c - nighttime: n/c >> 68-82 >> n/c Lowest sugar was 60 at 3 am >> 73 >> 107 >> 69; he has hypoglycemia awareness in the 90s. Highest sugar was 316 (sick, no  insulin) >> 220 >> 295 >> 300.  Glucometer: One Touch Ultra   Pt's meals are: - Breakfast (usu. 30 units): sausage, 1 egg, bacon, rarely bisquit; cereals - Lunch (usu. 20 units): soup, sandwich - when working: fast food  - Dinner (usu. 30 units): meat/fish + vegetable, including sweet potatoes + occasionally: starch - Snacks: no dessert usually, cookies, crackers; diet soda, occasionally regular sodas, occasionally milk  He has a history of lap band surgery in 2012.  + CKD, last BUN/creatinine:  Lab Results  Component Value Date   BUN 18 08/05/2019   BUN 19 05/10/2019   CREATININE 0.87 08/05/2019   CREATININE 1.13 05/10/2019  He is off losartan and valsartan due to shortness of breath, sweating, dizziness.  On metoprolol. + HL; last set of lipids: Lab Results  Component Value Date   CHOL 143 08/05/2019   HDL 43.60 08/05/2019   LDLCALC 79 08/05/2019   TRIG 104.0 08/05/2019   CHOLHDL 3 08/05/2019  On Lipitor. - last eye exam was in 01/2019: No DR. He had a retinal detachment and had to have surgery. He also had cataract Sx. Sees Dr. Dione Booze. Has an exam next month. -+ Numbness in his legs after his back surgery.  He had NCT. On ASA 81.  He had RMSF in 2012.  ROS: Constitutional: no weight  gain/no weight loss, no fatigue, no subjective hyperthermia, no subjective hypothermia Eyes: no blurry vision, no xerophthalmia ENT: no sore throat, no nodules palpated in neck, no dysphagia, no odynophagia, no hoarseness Cardiovascular: no CP/no SOB/no palpitations/no leg swelling Respiratory: no cough/no SOB/no wheezing Gastrointestinal: no N/no V/no D/no C/no acid reflux Musculoskeletal: no muscle aches/no joint aches Skin: no rashes, no hair loss Neurological: no tremors/+ numbness/+ tingling/no dizziness  I reviewed pt's medications, allergies, PMH, social hx, family hx, and changes were documented in the history of present illness. Otherwise, unchanged from my initial visit  note.  Past Medical History:  Diagnosis Date  . Allergic rhinitis, cause unspecified   . Chronic diastolic CHF (congestive heart failure) ([place])   . Degeneration of intervertebral disc, site unspecified   . Diabetes mellitus   . Hyperlipidemia   . Hypertension   . Impotence of organic origin   . Mild CAD 2015  . Obesity, unspecified   . Obstructive sleep apnea (adult) (pediatric)   . Other chronic allergic conjunctivitis   . Personal history of colonic polyps    Past Surgical History:  Procedure Laterality Date  . admission  01/2009   [facility].  Syncopal event with nausea, diaphoresis, hypertension.  D-dimer elevated at 2.54 with negative CT chest. and VQ scan.  Cardiac enzymes negative x 3.  Labs normal.  Cardiology consult---cardiolite abn; cath, 2D-echo, holter monitor unrevealing.  SE H&V.  Marland Kitchen Allergy Consult  07/11/2010   SOB, itching, facial tingling, hypotension.  Allergy testing: no food allergies; +reaction to tree pollen, grass pollen, dust mites, mold.  Avoid diclofenac and similar products.  [name].  . APPENDECTOMY    . arthroscopy of knee  1980  . BACK SURGERY     x3  . CATARACT EXTRACTION Left   . COLONOSCOPY  07/11/2009   single polyp; repeat in 5 years.  [name].  . GI consult  07/11/2010   CT chest in ED (gastric thickening).  [name].  No EGD warranted due to lack of symptoms.  Marland Kitchen LAPAROSCOPIC GASTRIC BANDING  09/15/09  . LEFT HEART CATHETERIZATION WITH CORONARY ANGIOGRAM N/A 04/02/2014   Procedure: LEFT HEART CATHETERIZATION WITH CORONARY ANGIOGRAM;  Surgeon: [name], MD;  Location[facility] C[facility] LAB;  Service: Cardiovascular;  Laterality: N/A;  . QUADRICEPS REPAIR     left  . QUADRICEPS TENDON REPAIR     left  . RETINAL DETACHMENT SURGERY    . ROTATOR CUFF REPAIR     right  . Sleep study  07/11/2010   repeat sleep study due to weight loss.  Severe OSA; CPAP titration to 8 cm.    . SPINE SURGERY    . TONSILLECTOMY AND ADENOIDECTOMY  1960   Social History    Social History  . Marital status: Married    Spouse name: N/A  . Number of children: 3  . Years of education: N/A   Occupational History  . 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 Smoking status: Former Smoker, quit in 1984    Packs/day: 0.25    Years: 2.00    Types: Cigarettes  . Smokeless tobacco: Never Used  . Alcohol use 0.0 oz/week     Comment: occasional Beer once every two weeks on the weekends  . Drug use: No   Social History Narrative   Married 1986       Children: 3 children, 3 grandchildren; 2 great  grandkids      Lives: with wife, dog.      Alcohol: beer twice per month on weekends.      Drugs: none      Exercise: walking three days per week.   Current Outpatient Medications on File Prior to Visit  Medication Sig Dispense Refill  . albuterol (PROVENTIL HFA;VENTOLIN HFA) 108 (90 Base) MCG/ACT inhaler Inhale 1-2 puffs into the lungs every 6 (six) hours as needed for wheezing or shortness of breath. 1 Inhaler 1  . amoxicillin-clavulanate (AUGMENTIN) 875-125 MG tablet Take 1 tablet by mouth 2 (two) times daily. 14 tablet 0  . aspirin EC 81 MG tablet Take 81 mg by mouth daily.    . B-D INS SYR ULTRAFINE 1CC/31G 31G X 5/16" 1 ML MISC USE FOR INSULIN INJECTIONS THREE TIMES DAILY 100 each 5  . cyclobenzaprine (FLEXERIL) 5 MG tablet TAKE 1 TABLET BY MOUTH EVERYDAY AT BEDTIME 30 tablet 1  . Dulaglutide (TRULICITY) 3 MG/0.5ML SOPN Inject 3 mg into the skin once a week. 12 pen 3  . FLUoxetine (PROZAC) 20 MG tablet TAKE 1 TABLET BY MOUTH DAILY 90 tablet 1  . glucose blood (ONE TOUCH ULTRA TEST) test strip Test blood sugar 3 times daily. Dx code: E11.9. Insulin treated 300 each 3  . insulin aspart (NOVOLOG FLEXPEN) 100 UNIT/ML FlexPen Inject 20-25 Units into the skin 3 (three) times daily with meals. 67.5 mL 2  . Insulin Detemir (LEVEMIR FLEXTOUCH) 100 UNIT/ML Pen Inject 52 Units into the skin  daily. 45 mL 3  . JARDIANCE 25 MG TABS tablet TAKE 25 MG BY MOUTH DAILY. 30 tablet 11  . metFORMIN (GLUCOPHAGE) 1000 MG tablet TAKE 1 TABLET (1,000 MG TOTAL) BY MOUTH 2 (TWO) TIMES DAILY WITH A MEAL. 180 tablet 2  . Multiple Vitamin (MULTIVITAMIN) capsule Take 1 capsule by mouth daily.    . nebivolol (BYSTOLIC) 2.5 MG tablet Take 1 tablet (2.5 mg total) by mouth daily. 90 tablet 3  . spironolactone (ALDACTONE) 25 MG tablet TAKE 1/2 TABLET BY MOUTH EVERY DAY 45 tablet 3  . atorvastatin (LIPITOR) 80 MG tablet Take 1 tablet (80 mg total) by mouth daily. 90 tablet 3   No current facility-administered medications on file prior to visit.   Allergies  Allergen Reactions  . Diclofenac Sodium Shortness Of Breath, Itching and Other (See Comments)    Hypotension   . Voltaren [Diclofenac Sodium] Anaphylaxis  . Metoprolol     Lightheaded/dizzy/SOB.    . Ramipril     Lightheaded/dizzy/SOB.     Family History  Problem Relation Age of Onset  . Cancer Father        skin  . Heart disease Father [id number]       AMI first age 61; stents.  . Diabetes Father   . Hypertension Father   . Hyperlipidemia Father   . Peripheral Artery Disease Father   . Parkinson's disease Father   . Cancer Paternal Grandfather        skin  . Diabetes Mother   . Arthritis Mother        total hip replacement  . Kidney failure Mother   . Depression Sister   . Diabetes Sister   . Cancer Maternal Uncle        lymphoma  . Prostate cancer Maternal Uncle   . Colon cancer Neg Hx    PE: BP 118/76   Pulse 86   Ht 6\' 1"  (1.854 m)   Wt 264 lb (119.7 kg)  SpO2 99%   BMI 34.83 kg/m  Wt Readings from Last 3 Encounters:  12/24/19 264 lb (119.7 kg)  09/19/19 247 lb (112 kg)  08/12/19 247 lb (112 kg)   Constitutional: overweight, in NAD Eyes: PERRLA, EOMI, no exophthalmos ENT: moist mucous membranes, no thyromegaly, no cervical lymphadenopathy Cardiovascular: RRR, No MRG Respiratory: CTA B Gastrointestinal: abdomen soft,  NT, ND, BS+ Musculoskeletal: no deformities, strength intact in all 4 Skin: moist, warm, no rashes Neurological: no tremor with outstretched hands, DTR normal in all 4  ASSESSMENT: 1. DM2, insulin-dependent, uncontrolled, with complications - CAD - CHF - gastroparesis - ED  2. HL  3. Obesity class 1 BMI Classification:  < 18.5 underweight   18.5-24.9 normal weight   25.0-29.9 overweight   30.0-34.9 class I obesity   35.0-39.9 class II obesity   ? 40.0 class III obesity   PLAN:  1. Patient with longstanding, uncontrolled diabetes, on oral antidiabetic regimen with Metformin, SGLT2 inhibitor, and also weekly GLP-1 receptor agonist (increased at last visit) and basal-bolus insulin regimen.  We restarted NovoLog at last visit.  At that time, he just had a very high HbA1c with PCP, at 14.2%, increased from 10%.  He was checking sugars only in the morning and only occasionally after breakfast and before lunch, but not later in the day.  I strongly advised him to check later in the day, also.  However, there was a trend of significantly increased blood sugars from before to after breakfast.  This was most likely due to not taking NovoLog so I advised him to restart this.  We also increase the Trulicity from 1.5 to 3 mg weekly.  He tolerates this well.  I did advise him to get in touch with me with his blood sugars, but he did not do so since last visit. -At this visit, sugars are better than before, but still with a significant amount of them higher than target.  He is definitely more consistent with taking his NovoLog but he still forgets doses, after which his sugars are in the 200s.  He also snacks in the afternoon, which increases his predinner blood sugars.  I think overall the sugars have improved so I would suggest to continue the same regimen but we did discuss about cutting out the snacks or discussed healthier alternatives, if he really she absolutely needs a snack. - I suggested  to:  Patient Instructions  Please continue: - Metformin 1000 mg 2X a day with meals - Jardiance 25 mg before breakfast - Trulicity 3 mg weekly in a.m. - Levemir 52 units at night - NovoLog 20-25 units 3X a day, before each meal  Try not to snack in the pm.  Please return in 3 months with your sugar log. . - we checked his HbA1c: 10% (lower) - advised to check sugars at different times of the day - 3-4x a day, rotating check times - advised for yearly eye exams >> he is UTD - return to clinic in 3 months  2. HL -Reviewed latest lipid panel from 07/2019: LDL slightly above goal, the rest of the fractions at goal Lab Results  Component Value Date   CHOL 143 08/05/2019   HDL 43.60 08/05/2019   LDLCALC 79 08/05/2019   TRIG 104.0 08/05/2019   CHOLHDL 3 08/05/2019  -Continues Lipitor without side effects  3. Obesity class 1 -He previously lost 40 pounds due to glucose toxicity, and he also lost another 17 pounds before last visit; he now  gained 17 pounds since last visit -continue SGLT 2 inhibitor and GLP-1 receptor agonist which should also help with weight loss  Carlus Pavlov, MD PhD [facility] Endocr[facility]

## 2019-12-27 ENCOUNTER — Other Ambulatory Visit: Payer: BC Managed Care – PPO | Admitting: *Deleted

## 2019-12-27 ENCOUNTER — Other Ambulatory Visit: Payer: Self-pay

## 2019-12-27 DIAGNOSIS — I2510 Atherosclerotic heart disease of native coronary artery without angina pectoris: Secondary | ICD-10-CM

## 2019-12-27 DIAGNOSIS — E785 Hyperlipidemia, unspecified: Secondary | ICD-10-CM

## 2019-12-27 LAB — LIPID PANEL
Chol/HDL Ratio: 2.7 ratio (ref 0.0–5.0)
Cholesterol, Total: 140 mg/dL (ref 100–199)
HDL: 52 mg/dL (ref >39)
LDL Chol Calc (NIH): 66 mg/dL (ref 0–99)
Triglycerides: 126 mg/dL (ref 0–149)
VLDL Cholesterol Cal: 22 mg/dL (ref 5–40)

## 2019-12-27 LAB — HEPATIC FUNCTION PANEL
ALT: 11 IU/L (ref 0–44)
AST: 19 IU/L (ref 0–40)
Albumin: 4.2 g/dL (ref 3.8–4.9)
Alkaline Phosphatase: 94 IU/L (ref 48–121)
Bilirubin Total: 0.4 mg/dL (ref 0.0–1.2)
Bilirubin, Direct: 0.12 mg/dL (ref 0.00–0.40)
Total Protein: 7 g/dL (ref 6.0–8.5)

## 2020-01-09 ENCOUNTER — Other Ambulatory Visit: Payer: Self-pay | Admitting: Family Medicine

## 2020-01-09 ENCOUNTER — Encounter: Payer: Self-pay | Admitting: Internal Medicine

## 2020-01-09 MED ORDER — LEVEMIR FLEXTOUCH 100 UNIT/ML ~~LOC~~ SOPN: 52.0000 [IU] | PEN_INJECTOR | Freq: Every day | SUBCUTANEOUS | 3 refills | Status: DC

## 2020-01-09 NOTE — Telephone Encounter (Signed)
Electronic refill request. Cyclobenzaprine Last office visit:   08/12/2019 Last Filled:    30 tablet 1 07/31/2019  Please advise.

## 2020-01-10 NOTE — Telephone Encounter (Signed)
Sent. Thanks.   

## 2020-01-17 ENCOUNTER — Encounter: Payer: Self-pay | Admitting: Family Medicine

## 2020-01-17 ENCOUNTER — Other Ambulatory Visit: Payer: Self-pay

## 2020-01-17 ENCOUNTER — Ambulatory Visit (INDEPENDENT_AMBULATORY_CARE_PROVIDER_SITE_OTHER): Payer: BC Managed Care – PPO | Admitting: Family Medicine

## 2020-01-17 DIAGNOSIS — M5430 Sciatica, unspecified side: Secondary | ICD-10-CM

## 2020-01-17 MED ORDER — GABAPENTIN 100 MG PO CAPS: 100.0000 mg | ORAL_CAPSULE | Freq: Three times a day (TID) | ORAL | 1 refills | Status: DC | PRN

## 2020-01-17 NOTE — Patient Instructions (Signed)
Try taking gabapentin if needed.  You may only need it at night.  Start with 100mg .  Increase to 200mg  if needed.  Keep stretching and update me as needed.  Take care.  Glad to see you.

## 2020-01-17 NOTE — Progress Notes (Signed)
This visit occurred during the SARS-CoV-2 public health emergency.  Safety protocols were in place, including screening questions prior to the visit, additional usage of staff PPE, and extensive cleaning of exam room while observing appropriate contact time as indicated for disinfecting solutions.  R hip and leg pain, radiating down the leg, worse at night. Going on for weeks.  Not severe right now but noted.  No radiation past the R knee.  Tylenol and muscle relaxers help.  Less pain when up and moving in the day.  No FCNAVD.  No B/B sx.  No L sided weakness.  He "tweaked' his back a few weeks ago but that seems to be better.    Sugar has been reasonable recently, ~100 in the AM, higher later in the day.  D/w pt.  He had endo follow up.    Meds, vitals, and allergies reviewed.   ROS: Per HPI unless specifically indicated in ROS section   nad ncat rrr ctab Back nontender to palpation midline.  No flank pain. R SLR neg with normal R hip ROM.   Able to bear weight.  See after visit summary.

## 2020-01-19 DIAGNOSIS — M5430 Sciatica, unspecified side: Secondary | ICD-10-CM | POA: Insufficient documentation

## 2020-01-19 NOTE — Assessment & Plan Note (Signed)
He "tweaked" his back a few weeks ago.  He has a negative straight leg raise right now but he tends to do better in the middle of the day.  Given his normal hip range of motion is probably not a primary hip issue.  More likely to be sciatica given the radiation down the leg.  Still able to bear weight.  Discussed options.  He can use gabapentin in the meantime.  Would avoid steroids right now given his history of diabetes.  Imaging not needed right now as it would not change his management.  He will update me as needed.  He agrees.

## 2020-04-03 ENCOUNTER — Ambulatory Visit: Payer: BC Managed Care – PPO | Admitting: Internal Medicine

## 2020-04-22 ENCOUNTER — Ambulatory Visit (INDEPENDENT_AMBULATORY_CARE_PROVIDER_SITE_OTHER): Payer: BC Managed Care – PPO

## 2020-04-22 ENCOUNTER — Other Ambulatory Visit: Payer: Self-pay

## 2020-04-22 DIAGNOSIS — Z23 Encounter for immunization: Secondary | ICD-10-CM

## 2020-04-27 ENCOUNTER — Other Ambulatory Visit: Payer: Self-pay | Admitting: Student

## 2020-05-14 ENCOUNTER — Ambulatory Visit: Payer: BC Managed Care – PPO | Admitting: Internal Medicine

## 2020-05-19 ENCOUNTER — Other Ambulatory Visit: Payer: Self-pay | Admitting: Family Medicine

## 2020-05-19 NOTE — Telephone Encounter (Signed)
Refill request Cyclobenzaprine last refill 01/10/20 #30/1 Gabapentin last refill 01/17/20 #40/1 Last office visit 01/17/20

## 2020-05-20 NOTE — Telephone Encounter (Signed)
Sent. Thanks.   

## 2020-06-07 ENCOUNTER — Other Ambulatory Visit: Payer: Self-pay | Admitting: Internal Medicine

## 2020-06-07 ENCOUNTER — Other Ambulatory Visit: Payer: Self-pay | Admitting: Student

## 2020-06-07 ENCOUNTER — Other Ambulatory Visit: Payer: Self-pay | Admitting: Cardiovascular Disease

## 2020-06-07 DIAGNOSIS — E1165 Type 2 diabetes mellitus with hyperglycemia: Secondary | ICD-10-CM

## 2020-06-07 DIAGNOSIS — E1159 Type 2 diabetes mellitus with other circulatory complications: Secondary | ICD-10-CM

## 2020-06-12 ENCOUNTER — Ambulatory Visit: Payer: BC Managed Care – PPO | Admitting: Cardiovascular Disease

## 2020-06-23 ENCOUNTER — Ambulatory Visit: Payer: BC Managed Care – PPO | Admitting: Internal Medicine

## 2020-06-30 ENCOUNTER — Ambulatory Visit: Payer: BC Managed Care – PPO | Admitting: Internal Medicine

## 2020-06-30 NOTE — Progress Notes (Deleted)
Patient ID: [name], male   DOB: September 14, 1960, 59 y.o.   MRN: [id number]   This visit occurred during the SARS-CoV-2 public health emergency.  Safety protocols were in place, including screening questions prior to the visit, additional usage of staff PPE, and extensive cleaning of exam room while observing appropriate contact time as indicated for disinfecting solutions.   HPI: [name] is a 59 y.o.-year-old male, presenting for f/u for DM2, dx in 1993, insulin-dependent, uncontrolled since ~2005, with complications (CAD, CHF, gastroparesis, ED). Last visit 6 months ago.  He is usually compliant with office visits.  Reviewed HbA1c levels: Lab Results  Component Value Date   HGBA1C 10.0 (A) 12/24/2019   HGBA1C 14.2 (H) 08/05/2019   HGBA1C 10.0 (H) 01/18/2018  03/07/2017: HbA1c calculated from the fructosamine 10%  Pt is on a regimen of: - Metformin 1000 mg 2X a day with meals - Jardiance 25 mg before breakfast - Trulicity 1.5 >> 3 mg weekly in a.m. - Levemir 60 >> 52 units in HS >> in a.m. >> 52 units at night - NovoLog 20-25 units 3X a day, before each meal >> was missing many doses at last visit He also tried other oral meds >> cannot remember name.  Pt checks his sugars 1-2 times a day: - am:  73, 121-130, 217 >> 107-168, 200, 217 >> 93-180, 200, 218 - 2h after b'fast: 143-181 >> 143-258 >> n/c >> 202-295 >> 69, 164 - before lunch: 145-238 >> 130-191 >> 180-241 >> 72-229 - 2h after lunch:  n/c >> 352 >> n/c >> 75, 193 - before dinner: 259-313 >> 220-241 >> n/c >> 145, 184-274, 300 - 2h after dinner: 248 >> n/c >> 95 >> n/c >> 99-151 - bedtime: 210-278 >> 111-200 >> n/c - nighttime: n/c >> 68-82 >> n/c Lowest sugar was 107 >> 69; he has hypoglycemia awareness  in the 90s.  Highest sugar was 295 >> 300.  Glucometer: One Touch Ultra   Pt's meals are: - Breakfast (usu. 30 units): sausage, 1 egg, bacon, rarely bisquit; cereals - Lunch (usu. 20 units): soup, sandwich - when  working: fast food  - Dinner (usu. 30 units): meat/fish + vegetable, including sweet potatoes + occasionally: starch - Snacks: no dessert usually, cookies, crackers; diet soda, occasionally regular sodas, occasionally milk  He has a history of lap band surgery in 2012.  + CKD, last BUN/creatinine:  Lab Results  Component Value Date   BUN 18 08/05/2019   BUN 19 05/10/2019   CREATININE 0.87 08/05/2019   CREATININE 1.13 05/10/2019  Off losartan and valsartan due to shortness of breath, sweating, dizziness. + HL; last set of lipids: Lab Results  Component Value Date   CHOL 140 12/27/2019   HDL 52 12/27/2019   LDLCALC 66 12/27/2019   TRIG 126 12/27/2019   CHOLHDL 2.7 12/27/2019  On Lipitor 80. - last eye exam was in 01/2020: No DR reportedly. He had a retinal detachment and had to have surgery. He also had cataract Sx. Sees Dr. Dione Booze. -+ Numbness in his legs after his back surgery.  He had NCT. On ASA 81.  He had RMSF in 2012.  His wife has ALS.  ROS: Constitutional: no weight gain/no weight loss, no fatigue, no subjective hyperthermia, no subjective hypothermia Eyes: no blurry vision, no xerophthalmia ENT: no sore throat, no nodules palpated in neck, no dysphagia, no odynophagia, no hoarseness Cardiovascular: no CP/no SOB/no palpitations/no leg swelling Respiratory: no cough/no SOB/no wheezing Gastrointestinal: no  N/no V/no D/no C/no acid reflux Musculoskeletal: no muscle aches/no joint aches Skin: no rashes, no hair loss Neurological: no tremors/+ numbness/+ tingling/no dizziness  I reviewed pt's medications, allergies, PMH, social hx, family hx, and changes were documented in the history of present illness. Otherwise, unchanged from my initial visit note.  Past Medical History:  Diagnosis Date  . Allergic rhinitis, cause unspecified   . Chronic diastolic CHF (congestive heart failure) (HCC)   . Degeneration of intervertebral disc, site unspecified   . Diabetes mellitus    . Hyperlipidemia   . Hypertension   . Impotence of organic origin   . Mild CAD 2015  . Obesity, unspecified   . Obstructive sleep apnea (adult) (pediatric)   . Other chronic allergic conjunctivitis   . Personal history of colonic polyps    Past Surgical History:  Procedure Laterality Date  . admission  01/2009   [facility].  Syncopal event with nausea, diaphoresis, hypertension.  D-dimer elevated at 2.54 with negative CT chest. and VQ scan.  Cardiac enzymes negative x 3.  Labs normal.  Cardiology consult---cardiolite abn; cath, 2D-echo, holter monitor unrevealing.  SE H&V.  Marland Kitchen Allergy Consult  07/11/2010   SOB, itching, facial tingling, hypotension.  Allergy testing: no food allergies; +reaction to tree pollen, grass pollen, dust mites, mold.  Avoid diclofenac and similar products.  [name].  . APPENDECTOMY    . arthroscopy of knee  1980  . BACK SURGERY     x3  . CATARACT EXTRACTION Left   . COLONOSCOPY  07/11/2009   single polyp; repeat in 5 years.  [name].  . GI consult  07/11/2010   CT chest in ED (gastric thickening).  [name].  No EGD warranted due to lack of symptoms.  Marland Kitchen LAPAROSCOPIC GASTRIC BANDING  09/15/09  . LEFT HEART CATHETERIZATION WITH CORONARY ANGIOGRAM N/A 04/02/2014   Procedure: LEFT HEART CATHETERIZATION WITH CORONARY ANGIOGRAM;  Surgeon: Lesleigh Noe, MD;  Location: [facility] CATH LAB;  Service: Cardiovascular;  Laterality: N/A;  . QUADRICEPS REPAIR     left  . QUADRICEPS TENDON REPAIR     left  . RETINAL DETACHMENT SURGERY    . ROTATOR CUFF REPAIR     right  . Sleep study  07/11/2010   repeat sleep study due to weight loss.  Severe OSA; CPAP titration to 8 cm.    . SPINE SURGERY    . TONSILLECTOMY AND ADENOIDECTOMY  1960   Social History   Social History  . Marital status: Married    Spouse name: N/A  . Number of children: 3  . Years of education: N/A   Occupational History  . 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 Smoking status: Former Smoker, quit in 1984    Packs/day: 0.25    Years: 2.00    Types: Cigarettes  . Smokeless tobacco: Never Used  . Alcohol use 0.0 oz/week     Comment: occasional Beer once every two weeks on the weekends  . Drug use: No   Social History Narrative   Married 1986       Children: 3 children, 3 grandchildren; 2 great grandkids      Lives: with wife, dog.      Alcohol: beer twice per month on weekends.      Drugs: none      Exercise: walking three days per week.   Current Outpatient Medications on  File Prior to Visit  Medication Sig Dispense Refill  . albuterol (PROVENTIL HFA;VENTOLIN HFA) 108 (90 Base) MCG/ACT inhaler Inhale 1-2 puffs into the lungs every 6 (six) hours as needed for wheezing or shortness of breath. 1 Inhaler 1  . aspirin EC 81 MG tablet Take 81 mg by mouth daily.    Marland Kitchen atorvastatin (LIPITOR) 80 MG tablet Take 1 tablet (80 mg total) by mouth daily. Please keep upcoming appt in March 2022 with Dr. Clifton James before anymore refills. Thank you Final Attempt 90 tablet 0  . B-D INS SYR ULTRAFINE 1CC/31G 31G X 5/16" 1 ML MISC USE FOR INSULIN INJECTIONS THREE TIMES DAILY 100 each 5  . cyclobenzaprine (FLEXERIL) 5 MG tablet TAKE 1 TABLET BY MOUTH EVERYDAY AT BEDTIME 30 tablet 1  . Dulaglutide (TRULICITY) 3 MG/0.5ML SOPN Inject 3 mg into the skin once a week. 12 pen 3  . Dulaglutide (TRULICITY) 3 MG/0.5ML SOPN Inject 3 MG into the skin once a week 6 mL 1  . FLUoxetine (PROZAC) 20 MG tablet TAKE 1 TABLET BY MOUTH DAILY 90 tablet 1  . gabapentin (NEURONTIN) 100 MG capsule TAKE 1-2 CAPSULES (100-200 MG TOTAL) BY MOUTH 3 (THREE) TIMES DAILY AS NEEDED. 40 capsule 1  . glucose blood (ONE TOUCH ULTRA TEST) test strip Test blood sugar 3 times daily. Dx code: E11.9. Insulin treated 300 each 3  . insulin aspart (NOVOLOG FLEXPEN) 100 UNIT/ML FlexPen Inject 20-25 Units into the skin 3 (three) times daily with meals. 67.5 mL 2  . insulin  detemir (LEVEMIR FLEXTOUCH) 100 UNIT/ML FlexPen Inject 52 Units into the skin daily. 45 mL 3  . JARDIANCE 25 MG TABS tablet TAKE 25 MG BY MOUTH DAILY. 30 tablet 11  . metFORMIN (GLUCOPHAGE) 1000 MG tablet TAKE 1 TABLET (1,000 MG TOTAL) BY MOUTH 2 (TWO) TIMES DAILY WITH A MEAL. 180 tablet 2  . Multiple Vitamin (MULTIVITAMIN) capsule Take 1 capsule by mouth daily.    . nebivolol (BYSTOLIC) 2.5 MG tablet Take 1 tablet (2.5 mg total) by mouth daily. Please keep upcoming appt with Dr. Clifton James in March 2022 before anymore refills. Thank you 90 tablet 0  . spironolactone (ALDACTONE) 25 MG tablet TAKE 1/2 TABLET BY MOUTH EVERY DAY 45 tablet 3   No current facility-administered medications on file prior to visit.   Allergies  Allergen Reactions  . Diclofenac Sodium Shortness Of Breath, Itching and Other (See Comments)    Hypotension   . Voltaren [Diclofenac Sodium] Anaphylaxis  . Metoprolol     Lightheaded/dizzy/SOB.    . Ramipril     Lightheaded/dizzy/SOB.     Family History  Problem Relation Age of Onset  . Cancer Father        skin  . Heart disease Father 49       AMI first age 29; stents.  . Diabetes Father   . Hypertension Father   . Hyperlipidemia Father   . Peripheral Artery Disease Father   . Parkinson's disease Father   . Cancer Paternal Grandfather        skin  . Diabetes Mother   . Arthritis Mother        total hip replacement  . Kidney failure Mother   . Depression Sister   . Diabetes Sister   . Cancer Maternal Uncle        lymphoma  . Prostate cancer Maternal Uncle   . Colon cancer Neg Hx    PE: There were no vitals taken for this visit. Wt Readings  from Last 3 Encounters:  01/17/20 276 lb (125.2 kg)  12/24/19 264 lb (119.7 kg)  09/19/19 247 lb (112 kg)   Constitutional: overweight, in NAD Eyes: PERRLA, EOMI, no exophthalmos ENT: moist mucous membranes, no thyromegaly, no cervical lymphadenopathy Cardiovascular: RRR, No MRG Respiratory: CTA  B Gastrointestinal: abdomen soft, NT, ND, BS+ Musculoskeletal: no deformities, strength intact in all 4 Skin: moist, warm, no rashes Neurological: no tremor with outstretched hands, DTR normal in all 4  ASSESSMENT: 1. DM2, insulin-dependent, uncontrolled, with complications - CAD - CHF - gastroparesis - ED  2. HL  3. Obesity class 1 BMI Classification:  < 18.5 underweight   18.5-24.9 normal weight   25.0-29.9 overweight   30.0-34.9 class I obesity   35.0-39.9 class II obesity   ? 40.0 class III obesity   PLAN:  1. Patient with   longstanding, uncontrolled diabetes, on oral antidiabetic regimen with Metformin, SGLT2 inhibitor, and also weekly GLP-1 receptor agonist (increased at last visit) and basal-bolus insulin regimen.  We restarted NovoLog at last visit.  At that time, he just had a very high HbA1c with PCP, at 14.2%, increased from 10%.  He was checking sugars only in the morning and only occasionally after breakfast and before lunch, but not later in the day.  I strongly advised him to check later in the day, also.  However, there was a trend of significantly increased blood sugars from before to after breakfast.  This was most likely due to not taking NovoLog so I advised him to restart this.  We also increase the Trulicity from 1.5 to 3 mg weekly.  He tolerates this well.  I did advise him to get in touch with me with his blood sugars, but he did not do so since last visit. -At this visit, sugars are better than before, but still with a significant amount of them higher than target.  He is definitely more consistent with taking his NovoLog but he still forgets doses, after which his sugars are in the 200s.  He also snacks in the afternoon, which increases his predinner blood sugars.  I think overall the sugars have improved so I would suggest to continue the same regimen but we did discuss about cutting out the snacks or discussed healthier alternatives, if he really she  absolutely needs a snack.  - I suggested to:  Patient Instructions  Please continue: - Metformin 1000 mg 2X a day with meals - Jardiance 25 mg before breakfast - Trulicity 3 mg weekly in a.m. - Levemir 52 units at night - NovoLog 20-25 units 3X a day, before each meal  Try not to snack in the pm.  Please return in 3 months with your sugar log. . - we checked his HbA1c: 7%  - advised to check sugars at different times of the day - 4x a day, rotating check times - advised for yearly eye exams >> he is UTD - return to clinic in 3 months  2. HL -Reviewed latest lipid panel from 12/2019: All fractions at goal: Lab Results  Component Value Date   CHOL 140 12/27/2019   HDL 52 12/27/2019   LDLCALC 66 12/27/2019   TRIG 126 12/27/2019   CHOLHDL 2.7 12/27/2019  -He continues Lipitor 80 without side effects  3. Obesity class 1 -continue SGLT 2 inhibitor and GLP-1 receptor agonist which should also help with weight loss -He previously lost 40 pounds due to glucose toxicity in 2020 then another 17 pounds before our visit  in 07/2019, but then gained the 17 pounds back before our visit in 12/2019 -At this visit,  Carlus Pavlov, MD PhD [facility] Endocrinology

## 2020-07-01 ENCOUNTER — Telehealth: Payer: Self-pay | Admitting: Internal Medicine

## 2020-07-01 NOTE — Telephone Encounter (Signed)
Patient missed his appointment yesterday and he called back to see if it is OK to reschedule? I noticed he's had quite a few cancelled appointments just this year. Please advise if it is OK to reschedule.

## 2020-07-02 NOTE — Telephone Encounter (Signed)
Will give him another chance. If he misses any more appointments, I will, unfortunately need to discharge him from the practice.

## 2020-07-15 ENCOUNTER — Encounter: Payer: Self-pay | Admitting: Internal Medicine

## 2020-07-15 ENCOUNTER — Other Ambulatory Visit: Payer: Self-pay

## 2020-07-15 ENCOUNTER — Ambulatory Visit: Payer: BC Managed Care – PPO | Admitting: Internal Medicine

## 2020-07-15 VITALS — BP 120/82 | HR 105 | Ht 73.0 in | Wt 284.6 lb

## 2020-07-15 DIAGNOSIS — E785 Hyperlipidemia, unspecified: Secondary | ICD-10-CM

## 2020-07-15 DIAGNOSIS — E1165 Type 2 diabetes mellitus with hyperglycemia: Secondary | ICD-10-CM

## 2020-07-15 DIAGNOSIS — E669 Obesity, unspecified: Secondary | ICD-10-CM

## 2020-07-15 DIAGNOSIS — Z6833 Body mass index (BMI) 33.0-33.9, adult: Secondary | ICD-10-CM

## 2020-07-15 DIAGNOSIS — E1159 Type 2 diabetes mellitus with other circulatory complications: Secondary | ICD-10-CM

## 2020-07-15 LAB — POCT GLYCOSYLATED HEMOGLOBIN (HGB A1C): Hemoglobin A1C: 9.5 % — AB (ref 4.0–5.6)

## 2020-07-15 MED ORDER — DEXCOM G6 TRANSMITTER MISC: 1.0000 | 3 refills | Status: DC

## 2020-07-15 MED ORDER — DEXCOM G6 SENSOR MISC: 1.0000 | 3 refills | Status: AC

## 2020-07-15 MED ORDER — DEXCOM G6 RECEIVER DEVI: 1.0000 | Freq: Once | 0 refills | Status: AC

## 2020-07-15 NOTE — Progress Notes (Signed)
Patient ID: KYIAN OBST, male   DOB: 04-09-1961, 60 y.o.   MRN: [id number]   This visit occurred during the SARS-CoV-2 public health emergency.  Safety protocols were in place, including screening questions prior to the visit, additional usage of staff PPE, and extensive cleaning of exam room while observing appropriate contact time as indicated for disinfecting solutions.    HPI: [name] is a 60 y.o.-year-old male, presenting for f/u for DM2, dx in 1993, insulin-dependent, uncontrolled since ~2005, with complications (CAD, CHF, gastroparesis, ED). Last visit 6.5 months ago.  He is usually noncompliant with office visits.   Reviewed HbA1c levels: Lab Results  Component Value Date   HGBA1C 10.0 (A) 12/24/2019   HGBA1C 14.2 (H) 08/05/2019   HGBA1C 10.0 (H) 01/18/2018  03/07/2017: HbA1c calculated from the fructosamine 10%  Pt is on a regimen of: - Metformin 1000 mg 2X a day with meals - Jardiance 25 mg before breakfast - Trulicity 1.5 >> 3 mg weekly in a.m. - Levemir 60 >> 52 units in HS >> in a.m. >> 52 units at night - NovoLog 20-25 units 3X a day, before each meal >> was missing many doses He also tried other oral meds >> cannot remember name.  Pt checks his sugars 2-3 times a day: - am:   107-168, 200, 217 >> 93-180, 200, 218 >> 93-236, 259 - 2h after b'fast: 143-258 >> n/c >> 202-295 >> 69, 164 >> 74-125 - before lunch:  130-191 >> 180-241 >> 72-229 >> 68-250 - 2h after lunch:  n/c >> 352 >> n/c >> 75, 193 >> 95, 207 - before dinner: 220-241 >> n/c >> 145, 184-274, 300 >>  59, 92-305, 374 - 2h after dinner: 248 >> n/c >> 95 >> n/c >> 99-151 >> n/c - bedtime: 210-278 >> 111-200 >> n/c - nighttime: n/c >> 68-82 >> n/c Lowest sugar was 107 >> 69 >> 59; he has hypoglycemia awareness in the 90s.  Highest sugar was 295 >> 30 >> 374.  Glucometer: One Touch Ultra   Pt's meals are: - Breakfast (usu. 30 units): sausage, 1 egg, bacon, rarely bisquit; cereals - Lunch (usu. 20  units): soup, sandwich - when working: fast food  - Dinner (usu. 30 units): meat/fish + vegetable, including sweet potatoes + occasionally: starch - Snacks: no dessert usually, cookies, crackers; diet soda, occasionally regular sodas, occasionally milk  He has a history of lap band surgery 2012.  + CKD, last BUN/creatinine:  Lab Results  Component Value Date   BUN 18 08/05/2019   BUN 19 05/10/2019   CREATININE 0.87 08/05/2019   CREATININE 1.13 05/10/2019  Off losartan and valsartan due to shortness of breath, sweating, dizziness. + HL; last set of lipids: Lab Results  Component Value Date   CHOL 140 12/27/2019   HDL 52 12/27/2019   LDLCALC 66 12/27/2019   TRIG 126 12/27/2019   CHOLHDL 2.7 12/27/2019  On Lipitor 80. - last eye exam was in 01/2020: + DR reportedly. He had a retinal detachment and had to have surgery. Sees Dr. Allyne Gee. Gets IO inj's.He also had cataract Sx. Sees Dr. Dione Booze.  -+ Numbness in his legs after his back surgery.  He had NCV. On ASA 81.  He had RMSF in 2012.  His wife has ALS.  ROS: Constitutional: no weight gain/no weight loss, no fatigue, no subjective hyperthermia, no subjective hypothermia Eyes: no blurry vision, no xerophthalmia ENT: no sore throat, no nodules palpated in neck, no dysphagia, no odynophagia,  no hoarseness Cardiovascular: no CP/no SOB/no palpitations/no leg swelling Respiratory: no cough/no SOB/no wheezing Gastrointestinal: no N/no V/no D/no C/no acid reflux Musculoskeletal: no muscle aches/no joint aches Skin: no rashes, no hair loss Neurological: no tremors/+ numbness/+ tingling/no dizziness  I reviewed pt's medications, allergies, PMH, social hx, family hx, and changes were documented in the history of present illness. Otherwise, unchanged from my initial visit note.  Past Medical History:  Diagnosis Date  . Allergic rhinitis, cause unspecified   . Chronic diastolic CHF (congestive heart failure) (HCC)   . Degeneration of  intervertebral disc, site unspecified   . Diabetes mellitus   . Hyperlipidemia   . Hypertension   . Impotence of organic origin   . Mild CAD 2015  . Obesity, unspecified   . Obstructive sleep apnea (adult) (pediatric)   . Other chronic allergic conjunctivitis   . Personal history of colonic polyps    Past Surgical History:  Procedure Laterality Date  . admission  01/2009   [facility].  Syncopal event with nausea, diaphoresis, hypertension.  D-dimer elevated at 2.54 with negative CT chest. and VQ scan.  Cardiac enzymes negative x 3.  Labs normal.  Cardiology consult---cardiolite abn; cath, 2D-echo, holter monitor unrevealing.  SE H&V.  Marland Kitchen Allergy Consult  07/11/2010   SOB, itching, facial tingling, hypotension.  Allergy testing: no food allergies; +reaction to tree pollen, grass pollen, dust mites, mold.  Avoid diclofenac and similar products.  [name].  . APPENDECTOMY    . arthroscopy of knee  1980  . BACK SURGERY     x3  . CATARACT EXTRACTION Left   . COLONOSCOPY  07/11/2009   single polyp; repeat in 5 years.  [name].  . GI consult  07/11/2010   CT chest in ED (gastric thickening).  [name].  No EGD warranted due to lack of symptoms.  Marland Kitchen LAPAROSCOPIC GASTRIC BANDING  09/15/09  . LEFT HEART CATHETERIZATION WITH CORONARY ANGIOGRAM N/A 04/02/2014   Procedure: LEFT HEART CATHETERIZATION WITH CORONARY ANGIOGRAM;  Surgeon: Lesleigh Noe, MD;  Location: [facility] CATH LAB;  Service: Cardiovascular;  Laterality: N/A;  . QUADRICEPS REPAIR     left  . QUADRICEPS TENDON REPAIR     left  . RETINAL DETACHMENT SURGERY    . ROTATOR CUFF REPAIR     right  . Sleep study  07/11/2010   repeat sleep study due to weight loss.  Severe OSA; CPAP titration to 8 cm.    . SPINE SURGERY    . TONSILLECTOMY AND ADENOIDECTOMY  1960   Social History   Social History  . Marital status: Married    Spouse name: N/A  . Number of children: 3  . Years of education: N/A   Occupational History  . 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 Smoking status: Former Smoker, quit in 1984    Packs/day: 0.25    Years: 2.00    Types: Cigarettes  . Smokeless tobacco: Never Used  . Alcohol use 0.0 oz/week     Comment: occasional Beer once every two weeks on the weekends  . Drug use: No   Social History Narrative   Married 1986       Children: 3 children, 3 grandchildren; 2 great grandkids      Lives: with wife, dog.      Alcohol: beer twice per month on weekends.      Drugs: none  Exercise: walking three days per week.   Current Outpatient Medications on File Prior to Visit  Medication Sig Dispense Refill  . albuterol (PROVENTIL HFA;VENTOLIN HFA) 108 (90 Base) MCG/ACT inhaler Inhale 1-2 puffs into the lungs every 6 (six) hours as needed for wheezing or shortness of breath. 1 Inhaler 1  . aspirin EC 81 MG tablet Take 81 mg by mouth daily.    Marland Kitchen atorvastatin (LIPITOR) 80 MG tablet Take 1 tablet (80 mg total) by mouth daily. Please keep upcoming appt in March 2022 with Dr. Angelena Form before anymore refills. Thank you Final Attempt 90 tablet 0  . B-D INS SYR ULTRAFINE 1CC/31G 31G X 5/16" 1 ML MISC USE FOR INSULIN INJECTIONS THREE TIMES DAILY 100 each 5  . cyclobenzaprine (FLEXERIL) 5 MG tablet TAKE 1 TABLET BY MOUTH EVERYDAY AT BEDTIME 30 tablet 1  . Dulaglutide (TRULICITY) 3 GE/9.5MW SOPN Inject 3 mg into the skin once a week. 12 pen 3  . Dulaglutide (TRULICITY) 3 UX/3.2GM SOPN Inject 3 MG into the skin once a week 6 mL 1  . FLUoxetine (PROZAC) 20 MG tablet TAKE 1 TABLET BY MOUTH DAILY 90 tablet 1  . gabapentin (NEURONTIN) 100 MG capsule TAKE 1-2 CAPSULES (100-200 MG TOTAL) BY MOUTH 3 (THREE) TIMES DAILY AS NEEDED. 40 capsule 1  . glucose blood (ONE TOUCH ULTRA TEST) test strip Test blood sugar 3 times daily. Dx code: E11.9. Insulin treated 300 each 3  . insulin aspart (NOVOLOG FLEXPEN) 100 UNIT/ML FlexPen Inject 20-25 Units into the skin  3 (three) times daily with meals. 67.5 mL 2  . insulin detemir (LEVEMIR FLEXTOUCH) 100 UNIT/ML FlexPen Inject 52 Units into the skin daily. 45 mL 3  . JARDIANCE 25 MG TABS tablet TAKE 25 MG BY MOUTH DAILY. 30 tablet 11  . metFORMIN (GLUCOPHAGE) 1000 MG tablet TAKE 1 TABLET (1,000 MG TOTAL) BY MOUTH 2 (TWO) TIMES DAILY WITH A MEAL. 180 tablet 2  . Multiple Vitamin (MULTIVITAMIN) capsule Take 1 capsule by mouth daily.    . nebivolol (BYSTOLIC) 2.5 MG tablet Take 1 tablet (2.5 mg total) by mouth daily. Please keep upcoming appt with Dr. Angelena Form in March 2022 before anymore refills. Thank you 90 tablet 0  . spironolactone (ALDACTONE) 25 MG tablet TAKE 1/2 TABLET BY MOUTH EVERY DAY 45 tablet 3   No current facility-administered medications on file prior to visit.   Allergies  Allergen Reactions  . Diclofenac Sodium Shortness Of Breath, Itching and Other (See Comments)    Hypotension   . Voltaren [Diclofenac Sodium] Anaphylaxis  . Metoprolol     Lightheaded/dizzy/SOB.    . Ramipril     Lightheaded/dizzy/SOB.     Family History  Problem Relation Age of Onset  . Cancer Father        skin  . Heart disease Father 61       AMI first age 72; stents.  . Diabetes Father   . Hypertension Father   . Hyperlipidemia Father   . Peripheral Artery Disease Father   . Parkinson's disease Father   . Cancer Paternal Grandfather        skin  . Diabetes Mother   . Arthritis Mother        total hip replacement  . Kidney failure Mother   . Depression Sister   . Diabetes Sister   . Cancer Maternal Uncle        lymphoma  . Prostate cancer Maternal Uncle   . Colon cancer Neg Hx  PE: BP 120/82   Pulse (!) 105   Ht 6\' 1"  (1.854 m)   Wt 284 lb 9.6 oz (129.1 kg)   SpO2 98%   BMI 37.55 kg/m  Wt Readings from Last 3 Encounters:  07/15/20 284 lb 9.6 oz (129.1 kg)  01/17/20 276 lb (125.2 kg)  12/24/19 264 lb (119.7 kg)   Constitutional: overweight, in NAD Eyes: PERRLA, EOMI, no  exophthalmos ENT: moist mucous membranes, no thyromegaly, no cervical lymphadenopathy Cardiovascular: tachycardia, RR, No MRG Respiratory: CTA B Gastrointestinal: abdomen soft, NT, ND, BS+ Musculoskeletal: no deformities, strength intact in all 4 Skin: moist, warm, no rashes Neurological: no tremor with outstretched hands, DTR normal in all 4  ASSESSMENT: 1. DM2, insulin-dependent, uncontrolled, with complications - CAD - CHF - gastroparesis - ED  2. HL  3. Obesity class 1 BMI Classification:  < 18.5 underweight   18.5-24.9 normal weight   25.0-29.9 overweight   30.0-34.9 class I obesity   35.0-39.9 class II obesity   ? 40.0 class III obesity   PLAN:  1. Patient with longstanding, uncontrolled, type 2 diabetes, on oral antidiabetic regimen with Metformin and SGLT2 inhibitor also on weekly GLP-1 receptor agonist and basal-bolus insulin regimen, returning after another long absence.  He had several visit cancellations and no-shows since last visit so at this visit we discussed that if he is not coming for his appointments, I would not be able to help him. -Latest HbA1c is from 12/2019 and this was slightly low, but still very high, and it 10%.  At that time, sugars were slightly better but the majority of them were still above target.  He was more consistent with taking his NovoLog but he still forgetting doses, after which his sugars were in the 200s.  He was also snacking, which led to higher blood sugars before dinner.  Other than suggesting dietary changes and also to improve his compliance with insulin, we did not change his regimen at that time. -At today's visit, we reviewed together his sugar logs.  Sugars are very variable, from the 50s (one value in the last 2 months) to the 300s.  He mentions that he is not forgetting NovoLog as frequently as before, but he is mostly taking 20 to 22 units.  We discussed about taking the higher dose, 25 units if the sugars are high before  the meal, >150, or if he is having a larger meal.  I also underlined the importance of taking the NovoLog 15 minutes before each meal.  We again discussed about the importance of improving diet. - I also suggested a CGM.  His wife uses a Dexcom CGM and likes it.  I sent a prescription for the this to his pharmacy.  This can definitely help with compliance with blood sugar checks and also, hopefully, behavior modification. - I suggested to:  Patient Instructions  Please continue: - Metformin 1000 mg 2X a day with meals - Jardiance 25 mg before breakfast - Trulicity 3 mg weekly in a.m. - Levemir 52 units at night - NovoLog 20-25 units 3X a day, before each meal  Try to get the Dexcom CGM.  Please return in 3 months.  - we checked his HbA1c: 9.5% (slightly lower) - advised to check sugars at different times of the day - 4x a day, rotating check times - advised for yearly eye exams >> he is UTD - return to clinic in 3 months  2. HL -Reviewed latest lipid panel from 12/2019: All fractions  at goal: Lab Results  Component Value Date   CHOL 140 12/27/2019   HDL 52 12/27/2019   LDLCALC 66 12/27/2019   TRIG 126 12/27/2019   CHOLHDL 2.7 12/27/2019  -He continues Lipitor 80 without side effects  3. Obesity class 1 -continue SGLT 2 inhibitor and GLP-1 receptor agonist which should also help with weight loss -He previously lost 40 pounds due to glucose toxicity in 2020, and then another 17 pounds before our visit in 07/2019, but then gained 17 pounds back to her visit 12/2019 -At this visit, he gained 8 lbs  Carlus Pavlov, MD PhD [facility]

## 2020-07-15 NOTE — Addendum Note (Signed)
Addended by: Talise Sligh on: 07/15/2020 04:39 PM   Modules accepted: Orders  

## 2020-07-15 NOTE — Patient Instructions (Addendum)
Please continue: - Metformin 1000 mg 2X a day with meals - Jardiance 25 mg before breakfast - Trulicity 3 mg weekly in a.m. - Levemir 52 units at night - NovoLog 20-25 units 3X a day, before each meal  Try to get the Dexcom CGM.  Please return in 3 months.

## 2020-07-16 ENCOUNTER — Encounter: Payer: Self-pay | Admitting: Internal Medicine

## 2020-07-17 ENCOUNTER — Other Ambulatory Visit: Payer: Self-pay

## 2020-07-17 ENCOUNTER — Encounter: Payer: Self-pay | Admitting: Family Medicine

## 2020-07-17 ENCOUNTER — Telehealth (INDEPENDENT_AMBULATORY_CARE_PROVIDER_SITE_OTHER): Payer: Self-pay | Admitting: Family Medicine

## 2020-07-17 VITALS — BP 141/78 | HR 112 | Temp 98.4°F | Ht 73.0 in

## 2020-07-17 DIAGNOSIS — R059 Cough, unspecified: Secondary | ICD-10-CM

## 2020-07-17 LAB — POC INFLUENZA A&B (BINAX/QUICKVUE)
Influenza A, POC: NEGATIVE
Influenza B, POC: NEGATIVE

## 2020-07-17 NOTE — Progress Notes (Signed)
VIRTUAL VISIT Due to national recommendations of social distancing due to COVID 19, a virtual visit is felt to be most appropriate for this patient at this time.   I connected with the patient on 07/17/20 at 12:00 PM EST by virtual telehealth platform and verified that I am speaking with the correct person using two identifiers.   I discussed the limitations, risks, security and privacy concerns of performing an evaluation and management service by  virtual telehealth platform and the availability of in person appointments. I also discussed with the patient that there may be a patient responsible charge related to this service. The patient expressed understanding and agreed to proceed.  Patient location: Home Provider Location: [facility] Participants: Kerby Nora and [name]   Chief Complaint  Patient presents with  . Headache  . Facial Pressure  . Nasal Congestion    History of Present Illness:  60 year old male patient with history of  obesity, poorly controlled DM, HTN, CHF, sleep apnea, allergies presents with new onset, headache, sinus pain and pressure and nasal congestion ,.  Scratchy throat and occ cough started started on 07/16/2019. Hoarse voice x 2 days No fever,  but shivering and chills last night. Did npot check temp last night. Taking tylenol today.   Dry cough occ.  No SOB no change from baseline, no wheeze.  Neck and shoulders achy off and on. No loss orf taste or smell, no diarrhea. Wife and grandchild with similar symptoms.. neg COVID test.  Tylenol has helped some. S/P 2 COVID vaccines, no booster yet COVID test: none  COVID 19 screen No recent travel or known exposure to COVID19  The importance of social distancing was discussed today.   Review of Systems  Constitutional: Positive for chills and malaise/fatigue.  HENT: Positive for congestion, sinus pain and sore throat.   Eyes: Negative for pain.  Respiratory: Positive for cough.  Negative for shortness of breath and wheezing.   Cardiovascular: Negative for chest pain.      Past Medical History:  Diagnosis Date  . Allergic rhinitis, cause unspecified   . Chronic diastolic CHF (congestive heart failure) (HCC)   . Degeneration of intervertebral disc, site unspecified   . Diabetes mellitus   . Hyperlipidemia   . Hypertension   . Impotence of organic origin   . Mild CAD 2015  . Obesity, unspecified   . Obstructive sleep apnea (adult) (pediatric)   . Other chronic allergic conjunctivitis   . Personal history of colonic polyps     reports that he has quit smoking. His smoking use included cigarettes. He has a 0.50 pack-year smoking history. He has never used smokeless tobacco. He reports current alcohol use. He reports that he does not use drugs.   Current Outpatient Medications:  .  albuterol (PROVENTIL HFA;VENTOLIN HFA) 108 (90 Base) MCG/ACT inhaler, Inhale 1-2 puffs into the lungs every 6 (six) hours as needed for wheezing or shortness of breath., Disp: 1 Inhaler, Rfl: 1 .  aspirin EC 81 MG tablet, Take 81 mg by mouth daily., Disp: , Rfl:  .  atorvastatin (LIPITOR) 80 MG tablet, Take 1 tablet (80 mg total) by mouth daily. Please keep upcoming appt in March 2022 with Dr. Clifton James before anymore refills. Thank you Final Attempt, Disp: 90 tablet, Rfl: 0 .  B-D INS SYR ULTRAFINE 1CC/31G 31G X 5/16" 1 ML MISC, USE FOR INSULIN INJECTIONS THREE TIMES DAILY, Disp: 100 each, Rfl: 5 .  Continuous Blood Gluc Sensor (  DEXCOM G6 SENSOR) MISC, Inject 1 Device into the skin continuous for 10 days., Disp: 9 each, Rfl: 3 .  Continuous Blood Gluc Transmit (DEXCOM G6 TRANSMITTER) MISC, 1 Device by Does not apply route every 3 (three) months., Disp: 1 each, Rfl: 3 .  cyclobenzaprine (FLEXERIL) 5 MG tablet, TAKE 1 TABLET BY MOUTH EVERYDAY AT BEDTIME, Disp: 30 tablet, Rfl: 1 .  Dulaglutide (TRULICITY) 3 MG/0.5ML SOPN, Inject 3 mg into the skin once a week., Disp: 12 pen, Rfl: 3 .   Dulaglutide (TRULICITY) 3 MG/0.5ML SOPN, Inject 3 MG into the skin once a week, Disp: 6 mL, Rfl: 1 .  FLUoxetine (PROZAC) 20 MG tablet, TAKE 1 TABLET BY MOUTH DAILY, Disp: 90 tablet, Rfl: 1 .  gabapentin (NEURONTIN) 100 MG capsule, TAKE 1-2 CAPSULES (100-200 MG TOTAL) BY MOUTH 3 (THREE) TIMES DAILY AS NEEDED., Disp: 40 capsule, Rfl: 1 .  glucose blood (ONE TOUCH ULTRA TEST) test strip, Test blood sugar 3 times daily. Dx code: E11.9. Insulin treated, Disp: 300 each, Rfl: 3 .  insulin aspart (NOVOLOG FLEXPEN) 100 UNIT/ML FlexPen, Inject 20-25 Units into the skin 3 (three) times daily with meals., Disp: 67.5 mL, Rfl: 2 .  insulin detemir (LEVEMIR FLEXTOUCH) 100 UNIT/ML FlexPen, Inject 52 Units into the skin daily., Disp: 45 mL, Rfl: 3 .  JARDIANCE 25 MG TABS tablet, TAKE 25 MG BY MOUTH DAILY., Disp: 30 tablet, Rfl: 11 .  metFORMIN (GLUCOPHAGE) 1000 MG tablet, TAKE 1 TABLET (1,000 MG TOTAL) BY MOUTH 2 (TWO) TIMES DAILY WITH A MEAL., Disp: 180 tablet, Rfl: 2 .  Multiple Vitamin (MULTIVITAMIN) capsule, Take 1 capsule by mouth daily., Disp: , Rfl:  .  nebivolol (BYSTOLIC) 2.5 MG tablet, Take 1 tablet (2.5 mg total) by mouth daily. Please keep upcoming appt with Dr. Clifton James in March 2022 before anymore refills. Thank you, Disp: 90 tablet, Rfl: 0 .  spironolactone (ALDACTONE) 25 MG tablet, TAKE 1/2 TABLET BY MOUTH EVERY DAY, Disp: 45 tablet, Rfl: 3   Observations/Objective: Blood pressure (!) 141/78, pulse (!) 112, temperature 98.4 F (36.9 C), temperature source Oral, height 6\' 1"  (1.854 m).    Physical Exam Constitutional:      General: The patient is not in acute distress. Pulmonary:     Effort: Pulmonary effort is normal. No respiratory distress.  Neurological:     Mental Status: The patient is alert and oriented to person, place, and time.  Psychiatric:        Mood and Affect: Mood normal.        Behavior: Behavior normal.    Assessment and Plan Problem List Items Addressed This Visit     Cough - Primary     Likely COVID19  infection vs other viral infection. No clear sign of bacterial infection at this time.  Recommend testing .. info on how to obtain testing provided.. will come in for drive-through testing at [facility] .. for flu and COVID No SOB. No red flags/need for ER visit or in-person exam at respiratory clinic at this time..    Pt moderate risk for COVID complications given CAD, DM, obesity and age. If SOB begins symptoms worsening.. have low threshold for in-person exam, if severe shortness of breath ER visit recommended.  Can monitor Oxygen saturation at home with home monitor if able to obtain.  Go to ER if O2 sat < 90% on room air.  Reviewed home care and provided information through MyChart.  Recommended isolation until test returns. If returns positive 10 days  quarantine recommended.  Provided info about prevention of spread of COVID 19.       Relevant Orders   Novel Coronavirus, NAA ([facility])   POC Influenza A&B ([facility] test)        I discussed the assessment and treatment plan with the patient. The patient was provided an opportunity to ask questions and all were answered. The patient agreed with the plan and demonstrated an understanding of the instructions.   The patient was advised to call back or seek an in-person evaluation if the symptoms worsen or if the condition fails to improve as anticipated.     Eliezer Lofts, MD

## 2020-07-17 NOTE — Patient Instructions (Signed)
Have COVID and flu testing. Remain isolated until tests return.  Use mucinex DM and start flonsae 2 spray per nostril daily.  Rest, fluids. Go to ER if severe shortness of breath.

## 2020-07-17 NOTE — Assessment & Plan Note (Signed)
Likely COVID19  infection vs other viral infection. No clear sign of bacterial infection at this time.  Recommend testing .. info on how to obtain testing provided.. will come in for drive-through testing at [facility] .. for flu and COVID No SOB. No red flags/need for ER visit or in-person exam at respiratory clinic at this time..    Pt moderate risk for COVID complications given CAD, DM, obesity and age. If SOB begins symptoms worsening.. have low threshold for in-person exam, if severe shortness of breath ER visit recommended.  Can monitor Oxygen saturation at home with home monitor if able to obtain.  Go to ER if O2 sat < 90% on room air.  Reviewed home care and provided information through MyChart.  Recommended isolation until test returns. If returns positive 10 days quarantine recommended.  Provided info about prevention of spread of COVID 19.

## 2020-07-17 NOTE — Progress Notes (Signed)
Left message for [name] that I have put him on the schedule this afternoon at 3:00 pm.  Back door instructions provided.

## 2020-07-20 ENCOUNTER — Telehealth: Payer: Self-pay | Admitting: Family Medicine

## 2020-07-21 LAB — NOVEL CORONAVIRUS, NAA: SARS-CoV-2, NAA: DETECTED — AB

## 2020-08-10 ENCOUNTER — Other Ambulatory Visit: Payer: Self-pay | Admitting: Family Medicine

## 2020-08-11 NOTE — Telephone Encounter (Signed)
Pharmacy requests refill on: Gabapentin 100 mg    LAST REFILL: 05/20/2020 (Q-40, R-1) LAST OV: 01/17/2020 NEXT OV: Not Scheduled  PHARMACY: [facility]

## 2020-08-12 NOTE — Telephone Encounter (Signed)
Sent. Thanks.   

## 2020-08-17 ENCOUNTER — Other Ambulatory Visit: Payer: Self-pay

## 2020-08-17 MED ORDER — FLUOXETINE HCL 20 MG PO TABS
20.0000 mg | ORAL_TABLET | Freq: Every day | ORAL | 1 refills | Status: DC
Start: 2020-08-17 — End: 2021-06-02

## 2020-08-17 NOTE — Telephone Encounter (Signed)
Sent. Thanks.   

## 2020-08-17 NOTE — Telephone Encounter (Signed)
[name] at [name] left v/m that pts wife has requested refill of duloxetine generic cymbalta; [name] does not have that med on file and [name] wants to know is pt taking duloxetine. I spoke with pts wife and pt is presently taking fluoxetine 20 mg one tab daily and not the duloxetine, I spoke with [name] at [name] and gave her the above info and she request refill of fluoxetine to [facility]. pts wife said pt is out of med and request to be done today. Sending to [facility] CMA.

## 2020-08-17 NOTE — Telephone Encounter (Signed)
Called patient and let him know rx was sent to pharmacy

## 2020-10-07 ENCOUNTER — Ambulatory Visit: Payer: BC Managed Care – PPO | Admitting: Cardiovascular Disease

## 2020-10-11 ENCOUNTER — Encounter: Payer: Self-pay | Admitting: Internal Medicine

## 2020-10-14 ENCOUNTER — Ambulatory Visit: Payer: Self-pay | Admitting: Internal Medicine

## 2020-10-27 NOTE — Progress Notes (Signed)
Cardiology Office Note    Date:  11/04/2020   ID:  [name], DOB 10/19/1960, MRN [id number]   PCP:  Joaquim Namuncan, Graham S, MD   [facility]  Cardiologist:  Verne Carrowhristopher McAlhany, MD  Advanced Practice Provider:  No care team member to display Electrophysiologist:  None   [id number]}   Chief Complaint  Patient presents with  . Follow-up    History of Present Illness:  [name] is a 60 y.o. male with a history of CAD, chronic diastolic CHF, hypertension, hyperlipidemia, diabetes mellitus, sleep apnea, morbid obesity s/p lap band surgery in 2011    2015 Stress Myoview showed apical defect. Cardiac catheterization in 2015 showed mild coronary plaque but no obstructive disease. LVEDP was elevated at that time and patient was treated for diastolic CHF.Echo in 03/2017 showed LVEF of 55% with normal wall motion, grade 1 diastolic dysfunction, an no significant valvular abnormalities. Myoview in 03/2017 which was low risk with probable inferior thinning but mild inferior ischemia could not be ruled out. Continued medical therapy was recommended. 30 day Monitor showed sinus rhythm with one 4 beat run of SVT. He stopped his Losartan and symptoms resolved.   Patient comes in for yearly f/u. Denies chest pain, dizziness or presyncope. Has some DOE that is unchanged for several years. He takes care of disabled wife and mother in law with a stroke. 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 Doesn't use his CPAP.Had trouble getting his supplies with several moves. Has lost 100 lbs and keeping it off.No edema. [facility] and stable. A1C down to 7.9 from 10.     Past Medical History:  Diagnosis Date  . Allergic rhinitis, cause unspecified   . Chronic diastolic CHF (congestive heart failure) (HCC)   . Degeneration of intervertebral disc, site unspecified   . Diabetes mellitus   . Hyperlipidemia   . Hypertension   . Impotence of organic origin   . Mild CAD  2015  . Obesity, unspecified   . Obstructive sleep apnea (adult) (pediatric)   . Other chronic allergic conjunctivitis   . Personal history of colonic polyps     Past Surgical History:  Procedure Laterality Date  . admission  [facility].  Syncopal event with nausea, diaphoresis, hypertension.  D-dimer elevated at 2.54 with negative CT chest. and VQ scan.  Cardiac enzymes negative x 3.  Labs normal.  Cardiology consult---cardiolite abn; cath, 2D-echo, holter monitor unrevealing.  SE H&V.  Marland Kitchen. Allergy Consult  07/11/2010   SOB, itching, facial tingling, hypotension.  Allergy testing: no food allergies; +reaction to tree pollen, grass pollen, dust mites, mold.  Avoid diclofenac and similar products.  [name].  . APPENDECTOMY    . arthroscopy of knee  1980  . BACK SURGERY     x3  . CATARACT EXTRACTION Left   . COLONOSCOPY  07/11/2009   single polyp; repeat in 5 years.  [name].  . GI consult  07/11/2010   CT chest in ED (gastric thickening).  [name].  No EGD warranted due to lack of symptoms.  Marland Kitchen. LAPAROSCOPIC GASTRIC BANDING  09/15/09  . LEFT HEART CATHETERIZATION WITH CORONARY ANGIOGRAM N/A 04/02/2014   Procedure: LEFT HEART CATHETERIZATION WITH CORONARY ANGIOGRAM;  Surgeon: Lesleigh NoeHenry W Smith III, MD;  [facility];  Service: Cardiovascular;  Laterality: N/A;  . QUADRICEPS REPAIR     left  . QUADRICEPS TENDON REPAIR     left  . RETINAL DETACHMENT  SURGERY    . ROTATOR CUFF REPAIR     right  . Sleep study  07/11/2010   repeat sleep study due to weight loss.  Severe OSA; CPAP titration to 8 cm.    . SPINE SURGERY    . TONSILLECTOMY AND ADENOIDECTOMY  1960    Current Medications: Current Meds  Medication Sig  . albuterol (PROVENTIL HFA;VENTOLIN HFA) 108 (90 Base) MCG/ACT inhaler Inhale 1-2 puffs into the lungs every 6 (six) hours as needed for wheezing or shortness of breath.  Marland Kitchen aspirin EC 81 MG tablet Take 81 mg by mouth daily.  Marland Kitchen atorvastatin (LIPITOR) 80 MG tablet Take 1 tablet (80  mg total) by mouth daily. Please keep upcoming appt in March 2022 with Dr. Clifton James before anymore refills. Thank you Final Attempt  . B-D INS SYR ULTRAFINE 1CC/31G 31G X 5/16" 1 ML MISC USE FOR INSULIN INJECTIONS THREE TIMES DAILY  . Continuous Blood Gluc Transmit (DEXCOM G6 TRANSMITTER) MISC 1 Device by Does not apply route every 3 (three) months.  . cyclobenzaprine (FLEXERIL) 5 MG tablet TAKE 1 TABLET BY MOUTH EVERYDAY AT BEDTIME  . Dulaglutide (TRULICITY) 3 MG/0.5ML SOPN Inject 3 mg into the skin once a week.  . Dulaglutide (TRULICITY) 3 MG/0.5ML SOPN Inject 3 MG into the skin once a week  . FLUoxetine (PROZAC) 20 MG tablet Take 1 tablet (20 mg total) by mouth daily.  Marland Kitchen gabapentin (NEURONTIN) 100 MG capsule TAKE 1 TO 2 CAPSULES BY MOUTH 3 TIMES DAILY AS NEEDED  . glucose blood (ONE TOUCH ULTRA TEST) test strip Test blood sugar 3 times daily. Dx code: E11.9. Insulin treated  . insulin aspart (NOVOLOG FLEXPEN) 100 UNIT/ML FlexPen Inject 20-25 Units into the skin 3 (three) times daily with meals.  . insulin degludec (TRESIBA) 200 UNIT/ML FlexTouch Pen Inject 48 Units into the skin every evening.  . insulin detemir (LEVEMIR FLEXTOUCH) 100 UNIT/ML FlexPen Inject 52 Units into the skin daily.  Marland Kitchen JARDIANCE 25 MG TABS tablet TAKE 25 MG BY MOUTH DAILY.  . metFORMIN (GLUCOPHAGE) 1000 MG tablet TAKE 1 TABLET (1,000 MG TOTAL) BY MOUTH 2 (TWO) TIMES DAILY WITH A MEAL.  . Multiple Vitamin (MULTIVITAMIN) capsule Take 1 capsule by mouth daily.  . nebivolol (BYSTOLIC) 2.5 MG tablet Take 1 tablet (2.5 mg total) by mouth daily. Please keep upcoming appt with Dr. Clifton James in March 2022 before anymore refills. Thank you  . spironolactone (ALDACTONE) 25 MG tablet TAKE 1/2 TABLET BY MOUTH EVERY DAY     Allergies:   Diclofenac sodium, Voltaren [diclofenac sodium], Metoprolol, and Ramipril   Social History   Socioeconomic History  . Marital status: Married    Spouse name: Not on file  . Number of children: 3   . Years of education: Not on file  . Highest education level: Not on file  Occupational History  . 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 Smoking status: Former Smoker    Packs/day: 0.25    Years: 2.00    Pack years: 0.50    Types: Cigarettes  . Smokeless tobacco: Never Used  Substance and Sexual Activity  . Alcohol use: Yes    Alcohol/week: 0.0 standard drinks    Comment: occasional Beer once every two weeks on the weekends  . Drug use: No  . Sexual activity: Yes  Other Topics Concern  . Not on file  Social History  Narrative   Married 1986       Children: 3 children, 3 grandchildren; 2 great grandkids      Lives: with wife, dog.      Employment: employed.      Tobacco: never      Alcohol: beer twice per month on weekends.      Drugs: none      Exercise: walking three days per week.      Seatbelt: 100% of time      Guns: loaded and secured/locked.      Sunscreen: yes.   Social Determinants of Health   Financial Resource Strain: Not on file  Food Insecurity: Not on file  Transportation Needs: Not on file  Physical Activity: Not on file  Stress: Not on file  Social Connections: Not on file     Family History:  The patient's family history includes Arthritis in his mother; Cancer in his father, maternal uncle, and paternal grandfather; Depression in his sister; Diabetes in his father, mother, and sister; Heart disease (age of onset: 48) in his father; Hyperlipidemia in his father; Hypertension in his father; Kidney failure in his mother; Parkinson's disease in his father; Peripheral Artery Disease in his father; Prostate cancer in his maternal uncle.   ROS:   Please see the history of present illness.    ROS All other systems reviewed and are negative.   PHYSICAL EXAM:   VS:  BP 136/76   Pulse 77   Ht 6\' 3"  (1.905 m)   Wt 288 lb (130.6 kg)   SpO2 99%   BMI 36.00  kg/m   Physical Exam  GEN: Obese, in no acute distress  Neck: no JVD, carotid bruits, or masses Cardiac:RRR; no murmurs, rubs, or gallops  Respiratory:  clear to auscultation bilaterally, normal work of breathing GI: soft, nontender, nondistended, + BS Ext: without cyanosis, clubbing, or edema, Good distal pulses bilaterally Neuro:  Alert and Oriented x 3 Psych: euthymic mood, full affect  Wt Readings from Last 3 Encounters:  11/04/20 288 lb (130.6 kg)  07/15/20 284 lb 9.6 oz (129.1 kg)  01/17/20 276 lb (125.2 kg)      Studies/Labs Reviewed:   EKG:  EKG is ordered today.  The ekg ordered today demonstrates NSR, normal EKG  Recent Labs: 12/27/2019: ALT 11   Lipid Panel    Component Value Date/Time   CHOL 140 12/27/2019 0811   TRIG 126 12/27/2019 0811   TRIG 88 07/24/2006 1009   HDL 52 12/27/2019 0811   CHOLHDL 2.7 12/27/2019 0811   CHOLHDL 3 08/05/2019 0823   VLDL 20.8 08/05/2019 0823   LDLCALC 66 12/27/2019 0811    Additional studies/ records that were reviewed today include:    Echocardiogram 03/15/2017: Study Conclusions: - Left ventricle: The cavity size was normal. Wall thickness was   normal. The estimated ejection fraction was 55%. Wall motion was   normal; there were no regional wall motion abnormalities. Doppler   parameters are consistent with abnormal left ventricular   relaxation (grade 1 diastolic dysfunction). - Aortic valve: There was no stenosis. - Mitral valve: There was no significant regurgitation. - Right ventricle: The cavity size was normal. Systolic function   was normal. - Pulmonary arteries: No complete TR doppler jet so unable to   estimate PA systolic pressure. - Inferior vena cava: The vessel was normal in size. The   respirophasic diameter changes were in the normal range (= 50%),   consistent with normal central venous pressure.  Impressions: - Normal LV size with EF 55%. Normal RV size and systolic function.   No significant  valvular abnormalities. _______________   Myoview 03/15/2017:  Nuclear stress EF: 56%.  There was no ST segment deviation noted during stress.  Defect 1: There is a small defect of moderate severity present in the basal inferior and mid inferior location.  This is a low risk study.  The left ventricular ejection fraction is normal (55-65%).   Low risk stress nuclear study with probable inferior thinning; cannot R/O mild inferior ischemia; EF 56 with normal wall motion.  _______________   Cardiac Monitor  03/23/2017 to 04/21/2017: Sinus rhythm One 4 beat run of non-sustained ventricular tachycardia     Risk Assessment/Calculations:         ASSESSMENT:    1. Coronary artery disease involving native coronary artery of native heart without angina pectoris   2. Chronic diastolic CHF (congestive heart failure) (HCC)   3. Essential hypertension   4. Hyperlipidemia, unspecified hyperlipidemia type   5. Type 2 diabetes mellitus with obesity (HCC)   6. Sleep apnea, unspecified type      PLAN:  In order of problems listed above:  CAD mild on cath 2015, low risk NST 03/2017 no angina. On ASA and lipitor  Chronic diastolic CHF compensated  HTN BP controlled on bystolic, aldactone  HLD on lipitor, check FLP today  DM2 A1C down to 7.9 from 10  OSA not using CPAP since weight loss  Shared Decision Making/Informed Consent        Medication Adjustments/Labs and Tests Ordered: Current medicines are reviewed at length with the patient today.  Concerns regarding medicines are outlined above.  Medication changes, Labs and Tests ordered today are listed in the Patient Instructions below. Patient Instructions  Medication Instructions:  Your physician recommends that you continue on your current medications as directed. Please refer to the Current Medication list given to you today.  *If you need a refill on your cardiac medications before your next appointment, please call your  pharmacy*   Lab Work: Lipids- today   If you have labs (blood work) drawn today and your tests are completely normal, you will receive your results only by: Marland Kitchen MyChart Message (if you have MyChart) OR . A paper copy in the mail If you have any lab test that is abnormal or we need to change your treatment, we will call you to review the results.   Testing/Procedures: None ordered    Follow-Up: At [facility], you and your health needs are our priority.  As part of our continuing mission to provide you with exceptional heart care, we have created designated Provider Care Teams.  These Care Teams include your primary Cardiologist (physician) and Advanced Practice Providers (APPs -  Physician Assistants and Nurse Practitioners) who all work together to provide you with the care you need, when you need it.  We recommend signing up for the patient portal called "MyChart".  Sign up information is provided on this After Visit Summary.  MyChart is used to connect with patients for Virtual Visits (Telemedicine).  Patients are able to view lab/test results, encounter notes, upcoming appointments, etc.  Non-urgent messages can be sent to your provider as well.   To learn more about what you can do with MyChart, go to [email or web address].    Your next appointment:   12 month(s)  The format for your next appointment:   In Person  Provider:   You may see Verne Carrow,  MD or one of the following Advanced Practice Providers on your designated Care Team:    Ronie Spies, PA-C  Jacolyn Reedy, PA-C    Other Instructions None      Signed, Jacolyn Reedy, PA-C  11/04/2020 8:10 AM    [facility] [address] [place], [place], [place]  [address] Phone: [phone]; Fax: [phone]

## 2020-11-02 ENCOUNTER — Other Ambulatory Visit: Payer: Self-pay

## 2020-11-04 ENCOUNTER — Ambulatory Visit: Payer: BC Managed Care – PPO | Admitting: Physician Assistant

## 2020-11-04 ENCOUNTER — Other Ambulatory Visit: Payer: Self-pay

## 2020-11-04 ENCOUNTER — Encounter: Payer: Self-pay | Admitting: Physician Assistant

## 2020-11-04 VITALS — BP 136/76 | HR 77 | Ht 75.0 in | Wt 288.0 lb

## 2020-11-04 DIAGNOSIS — I2510 Atherosclerotic heart disease of native coronary artery without angina pectoris: Secondary | ICD-10-CM

## 2020-11-04 DIAGNOSIS — I10 Essential (primary) hypertension: Secondary | ICD-10-CM

## 2020-11-04 DIAGNOSIS — G4730 Sleep apnea, unspecified: Secondary | ICD-10-CM

## 2020-11-04 DIAGNOSIS — E1169 Type 2 diabetes mellitus with other specified complication: Secondary | ICD-10-CM

## 2020-11-04 DIAGNOSIS — I5032 Chronic diastolic (congestive) heart failure: Secondary | ICD-10-CM | POA: Diagnosis not present

## 2020-11-04 DIAGNOSIS — E669 Obesity, unspecified: Secondary | ICD-10-CM

## 2020-11-04 DIAGNOSIS — E785 Hyperlipidemia, unspecified: Secondary | ICD-10-CM | POA: Diagnosis not present

## 2020-11-04 LAB — LIPID PANEL
Chol/HDL Ratio: 3.1 ratio (ref 0.0–5.0)
Cholesterol, Total: 129 mg/dL (ref 100–199)
HDL: 42 mg/dL (ref >39)
LDL Chol Calc (NIH): 72 mg/dL (ref 0–99)
Triglycerides: 73 mg/dL (ref 0–149)
VLDL Cholesterol Cal: 15 mg/dL (ref 5–40)

## 2020-11-04 MED ORDER — NEBIVOLOL HCL 2.5 MG PO TABS: 2.5000 mg | ORAL_TABLET | Freq: Every day | ORAL | 3 refills | Status: DC

## 2020-11-04 MED ORDER — ATORVASTATIN CALCIUM 80 MG PO TABS: 80.0000 mg | ORAL_TABLET | Freq: Every day | ORAL | 3 refills | Status: DC

## 2020-11-04 NOTE — Patient Instructions (Addendum)
Medication Instructions:  Your physician recommends that you continue on your current medications as directed. Please refer to the Current Medication list given to you today.  *If you need a refill on your cardiac medications before your next appointment, please call your pharmacy*   Lab Work: Lipids- today   If you have labs (blood work) drawn today and your tests are completely normal, you will receive your results only by: Marland Kitchen MyChart Message (if you have MyChart) OR . A paper copy in the mail If you have any lab test that is abnormal or we need to change your treatment, we will call you to review the results.   Testing/Procedures: None ordered    Follow-Up: At [facility], you and your health needs are our priority.  As part of our continuing mission to provide you with exceptional heart care, we have created designated Provider Care Teams.  These Care Teams include your primary Cardiologist (physician) and Advanced Practice Providers (APPs -  Physician Assistants and Nurse Practitioners) who all work together to provide you with the care you need, when you need it.  We recommend signing up for the patient portal called "MyChart".  Sign up information is provided on this After Visit Summary.  MyChart is used to connect with patients for Virtual Visits (Telemedicine).  Patients are able to view lab/test results, encounter notes, upcoming appointments, etc.  Non-urgent messages can be sent to your provider as well.   To learn more about what you can do with MyChart, go to [email or web address].    Your next appointment:   12 month(s)  The format for your next appointment:   In Person  Provider:   You may see Verne Carrow, MD or one of the following Advanced Practice Providers on your designated Care Team:    Ronie Spies, PA-C  Jacolyn Reedy, PA-C    Other Instructions Try to get at least 150 minutes of exercise a week

## 2020-11-23 ENCOUNTER — Other Ambulatory Visit: Payer: Self-pay | Admitting: Internal Medicine

## 2020-12-01 ENCOUNTER — Other Ambulatory Visit: Payer: Self-pay | Admitting: Family Medicine

## 2020-12-02 NOTE — Telephone Encounter (Signed)
Refill request for gabapentin 100 mg caps   LOV - 07/17/20 Next OV - not scheduled Last refill - 08/12/20 #180/1

## 2020-12-02 NOTE — Telephone Encounter (Signed)
Sent. Thanks.   

## 2021-02-05 ENCOUNTER — Encounter: Payer: Self-pay | Admitting: Family Medicine

## 2021-02-05 NOTE — Telephone Encounter (Signed)
Last refilled on 05/20/20 #30 with 1 refill LOV 07/17/20 cough with Dr Ermalene Searing. LAST CPE 08/12/19  No future appointments

## 2021-02-07 MED ORDER — CYCLOBENZAPRINE HCL 5 MG PO TABS: ORAL_TABLET | ORAL | 1 refills | Status: DC

## 2021-02-07 NOTE — Telephone Encounter (Signed)
Sent. Thanks.  Please schedule CPE when possible.  

## 2021-02-10 NOTE — Telephone Encounter (Signed)
APPT SCHEDULED FOR 02/26/21

## 2021-02-14 ENCOUNTER — Other Ambulatory Visit: Payer: Self-pay | Admitting: Family Medicine

## 2021-02-14 DIAGNOSIS — Z125 Encounter for screening for malignant neoplasm of prostate: Secondary | ICD-10-CM

## 2021-02-14 DIAGNOSIS — I10 Essential (primary) hypertension: Secondary | ICD-10-CM

## 2021-02-15 ENCOUNTER — Other Ambulatory Visit (INDEPENDENT_AMBULATORY_CARE_PROVIDER_SITE_OTHER): Payer: BC Managed Care – PPO

## 2021-02-15 ENCOUNTER — Other Ambulatory Visit: Payer: Self-pay

## 2021-02-15 DIAGNOSIS — I10 Essential (primary) hypertension: Secondary | ICD-10-CM

## 2021-02-15 DIAGNOSIS — Z125 Encounter for screening for malignant neoplasm of prostate: Secondary | ICD-10-CM

## 2021-02-15 LAB — COMPREHENSIVE METABOLIC PANEL
ALT: 12 U/L (ref 0–53)
AST: 14 U/L (ref 0–37)
Albumin: 3.6 g/dL (ref 3.5–5.2)
Alkaline Phosphatase: 100 U/L (ref 39–117)
BUN: 19 mg/dL (ref 6–23)
CO2: 29 mEq/L (ref 19–32)
Calcium: 8.7 mg/dL (ref 8.4–10.5)
Chloride: 105 mEq/L (ref 96–112)
Creatinine, Ser: 0.94 mg/dL (ref 0.40–1.50)
GFR: 87.29 mL/min (ref >60.00)
Glucose, Bld: 125 mg/dL — ABNORMAL HIGH (ref 70–99)
Potassium: 4.7 mEq/L (ref 3.5–5.1)
Sodium: 140 mEq/L (ref 135–145)
Total Bilirubin: 0.5 mg/dL (ref 0.2–1.2)
Total Protein: 6.4 g/dL (ref 6.0–8.3)

## 2021-02-15 LAB — PSA: PSA: 0.31 ng/mL (ref 0.10–4.00)

## 2021-02-15 LAB — CBC WITH DIFFERENTIAL/PLATELET
Basophils Absolute: 0.1 10*3/uL (ref 0.0–0.1)
Basophils Relative: 0.9 % (ref 0.0–3.0)
Eosinophils Absolute: 0.3 10*3/uL (ref 0.0–0.7)
Eosinophils Relative: 5.7 % — ABNORMAL HIGH (ref 0.0–5.0)
HCT: 41.9 % (ref 39.0–52.0)
Hemoglobin: 13.8 g/dL (ref 13.0–17.0)
Lymphocytes Relative: 51.3 % — ABNORMAL HIGH (ref 12.0–46.0)
Lymphs Abs: 2.9 10*3/uL (ref 0.7–4.0)
MCHC: 32.9 g/dL (ref 30.0–36.0)
MCV: 88.4 fl (ref 78.0–100.0)
Monocytes Absolute: 0.5 10*3/uL (ref 0.1–1.0)
Monocytes Relative: 7.9 % (ref 3.0–12.0)
Neutro Abs: 2 10*3/uL (ref 1.4–7.7)
Neutrophils Relative %: 34.2 % — ABNORMAL LOW (ref 43.0–77.0)
Platelets: 271 10*3/uL (ref 150.0–400.0)
RBC: 4.74 Mil/uL (ref 4.22–5.81)
RDW: 13.8 % (ref 11.5–15.5)
WBC: 5.7 10*3/uL (ref 4.0–10.5)

## 2021-02-26 ENCOUNTER — Encounter: Payer: Self-pay | Admitting: Family Medicine

## 2021-02-26 ENCOUNTER — Other Ambulatory Visit: Payer: Self-pay

## 2021-02-26 ENCOUNTER — Ambulatory Visit (INDEPENDENT_AMBULATORY_CARE_PROVIDER_SITE_OTHER): Payer: BC Managed Care – PPO | Admitting: Family Medicine

## 2021-02-26 VITALS — BP 120/82 | HR 84 | Temp 97.7°F | Ht 75.0 in | Wt 300.0 lb

## 2021-02-26 DIAGNOSIS — J3489 Other specified disorders of nose and nasal sinuses: Secondary | ICD-10-CM

## 2021-02-26 DIAGNOSIS — E1159 Type 2 diabetes mellitus with other circulatory complications: Secondary | ICD-10-CM

## 2021-02-26 DIAGNOSIS — Z659 Problem related to unspecified psychosocial circumstances: Secondary | ICD-10-CM

## 2021-02-26 DIAGNOSIS — I10 Essential (primary) hypertension: Secondary | ICD-10-CM

## 2021-02-26 DIAGNOSIS — Z23 Encounter for immunization: Secondary | ICD-10-CM

## 2021-02-26 DIAGNOSIS — Z0000 Encounter for general adult medical examination without abnormal findings: Secondary | ICD-10-CM

## 2021-02-26 DIAGNOSIS — Z7189 Other specified counseling: Secondary | ICD-10-CM

## 2021-02-26 DIAGNOSIS — E785 Hyperlipidemia, unspecified: Secondary | ICD-10-CM

## 2021-02-26 MED ORDER — INSULIN DEGLUDEC 200 UNIT/ML ~~LOC~~ SOPN: 40.0000 [IU] | PEN_INJECTOR | Freq: Every evening | SUBCUTANEOUS | Status: DC

## 2021-02-26 MED ORDER — NOVOLOG FLEXPEN 100 UNIT/ML ~~LOC~~ SOPN: PEN_INJECTOR | SUBCUTANEOUS | Status: AC

## 2021-02-26 NOTE — Patient Instructions (Addendum)
Let me know if you need help getting a colonoscopy scheduled.  Take care.  Glad to see you.  If sinus symptoms persist then I would take a home covid test.  If you are getting worse, then let me know.    Tetanus shot today.  I would get a flu shot each fall.   Check with your insurance to see if they will cover the shingles shot. I would get the covid booster this fall.    As the front for a record release from Dr. Dione Booze and Dr. Allyne Gee at the two eye clinics.

## 2021-02-26 NOTE — Progress Notes (Signed)
This visit occurred during the SARS-CoV-2 public health emergency.  Safety protocols were in place, including screening questions prior to the visit, additional usage of staff PPE, and extensive cleaning of exam room while observing appropriate contact time as indicated for disinfecting solutions.  CPE- See plan.  Routine anticipatory guidance given to patient.  See health maintenance.  The possibility exists that previously documented standard health maintenance information may have been brought forward from a previous encounter into this note.  If needed, that same information has been updated to reflect the current situation based on today's encounter.    Tetanus 2022 Flu to be done yearly PNA  2016 Shingles d/w pt.   covid vaccine prev done.   Colon cancer screening d/w pt.  He'll call about follow up.  PSA wnl.  Living will d/w pt.  Wife designated if patient were incapacitated.   His mother in law was living with patient, she has h/o CVA/SZ.  Currently in [place].  Wife is wheelchair bound due to unclear dx.  His parents needs care.  D/w pt.  He has a lot going on.  Support offered.  Mood is still good, given the circumstances.  D/w pt.    DM2.  Sugar 124 this AM.  He had eye clnic f/u re: retinal changes this summer and his vision is back to 20/20 on the affected side.  Last A1c 7.5.  he has endo f/u pending.  Rare lows, cautions d/w pt.  Improved with lower tresiba dose.  He is using SSI for mealtimes.  Seeing [facility] and Dr. Dione Booze.  Still taking gabapentin for his foot sx.   Sinus sx. Some pressure in the last few days.  Ears feel stuffy.  Taking mucinex, that helps.  No fevers.  No vomiting, no diarrhea.  No loss or taste or smell.  No cough.  Not worse but not better.  Had covid in January.      See after visit summary.  Elevated Cholesterol: Using medications without problems: yes Muscle aches: no Diet compliance: d/w pt.   Exercise: d/w pt.    Hypertension:    Using  medication without problems or lightheadedness: yes Chest pain with exertion:no Edema:no Short of breath: occ, with exertion, worse in the heat.  Was able to work shoveling recently w/o sx.  Overall stable.    PMH and SH reviewed  Meds, vitals, and allergies reviewed.   ROS: Per HPI.  Unless specifically indicated otherwise in HPI, the patient denies:  General: fever. Eyes: acute vision changes ENT: sore throat Cardiovascular: chest pain Respiratory: SOB GI: vomiting GU: dysuria Musculoskeletal: acute back pain Derm: acute rash Neuro: acute motor dysfunction Psych: worsening mood Endocrine: polydipsia Heme: bleeding Allergy: hayfever  GEN: nad, alert and oriented HEENT: ncat, sinuses not ttp x4, TM wnl, nasal exam wnl, OP wnl.   NECK: supple w/o LA CV: rrr. PULM: ctab, no inc wob ABD: soft, +bs EXT: no edema SKIN: no acute rash  Diabetic foot exam: Normal inspection No skin breakdown No calluses  Normal DP pulses Dec sensation to light touch and monofilament Nails normal except B 1st nails absent- longstanding

## 2021-02-28 DIAGNOSIS — J3489 Other specified disorders of nose and nasal sinuses: Secondary | ICD-10-CM | POA: Insufficient documentation

## 2021-02-28 NOTE — Assessment & Plan Note (Signed)
Living will d/w pt.  Wife designated if patient were incapacitated.   ?

## 2021-02-28 NOTE — Assessment & Plan Note (Signed)
Tetanus 2022 Flu to be done yearly PNA  2016 Shingles d/w pt.   covid vaccine prev done.   Colon cancer screening d/w pt.  He'll call about follow up.  PSA wnl.  Living will d/w pt.  Wife designated if patient were incapacitated.

## 2021-02-28 NOTE — Assessment & Plan Note (Signed)
Continue nebivolol.  He will update me as needed.

## 2021-02-28 NOTE — Assessment & Plan Note (Signed)
I will defer to endocrinology.  He agrees with plan.

## 2021-02-28 NOTE — Assessment & Plan Note (Signed)
  His mother in law was living with patient, she has h/o CVA/SZ.  Currently in [place].  Wife is wheelchair bound due to unclear dx.  His parents needs care.  D/w pt.  He has a lot going on.  Support offered.  Mood is still good, given the circumstances.  D/w pt.

## 2021-02-28 NOTE — Assessment & Plan Note (Signed)
Continue Lipitor.  Discussed with patient about diet and trying to manage his weight.

## 2021-02-28 NOTE — Assessment & Plan Note (Signed)
Benign exam.  I do not suspect COVID. If sinus symptoms persist then I advised him to take a home covid test.  If he is getting worse, then he can let me know.  He agrees with plan.  Okay for outpatient follow-up.  Supportive care in the meantime.

## 2021-03-26 ENCOUNTER — Encounter: Payer: Self-pay | Admitting: Family Medicine

## 2021-03-29 ENCOUNTER — Telehealth: Payer: Self-pay | Admitting: *Deleted

## 2021-03-29 NOTE — Telephone Encounter (Signed)
   [facility] Pre-operative Risk Assessment    Patient Name: RIGO LETTS  DOB: [birth year] MRN: [id number]  HEARTCARE STAFF:  - IMPORTANT!!!!!! Under Visit Info/Reason for Call, type in Other and utilize the format Clearance MM/DD/YY or Clearance TBD. Do not use dashes or single digits. - Please review there is not already an duplicate clearance open for this procedure. - If request is for dental extraction, please clarify the # of teeth to be extracted. - If the patient is currently at the dentist's office, call Pre-Op Callback Staff (MA/nurse) to input urgent request.  - If the patient is not currently in the dentist office, please route to the Pre-Op pool.  Request for surgical clearance:  What type of surgery is being performed? 1 TOOTH EXTRACTION  When is this surgery scheduled? TBD  What type of clearance is required (medical clearance vs. Pharmacy clearance to hold med vs. Both)? MEDICAL  Are there any medications that need to be held prior to surgery and how long?  ASA   Practice name and name of physician performing surgery? [facility]  What is the office phone number? [phone]   7.   What is the office fax number? [phone]  8.   Anesthesia type (None, local, MAC, general) ? LOCAL   Julaine Hua 03/29/2021, 5:03 PM  _________________________________________________________________   (provider comments below)

## 2021-03-30 NOTE — Telephone Encounter (Signed)
   Patient Name: CLAYSON RILING  DOB: 1961-04-16 MRN: [id number]  Primary Cardiologist: Verne Carrow, MD  Chart reviewed as part of pre-operative protocol coverage.   IF SIMPLE EXTRACTION/CLEANINGS: Simple dental extractions are considered low risk procedures per guidelines and generally do not require any specific cardiac clearance. It is also generally accepted that for simple extractions and dental cleanings, there is no need to interrupt blood thinner therapy.  SBE prophylaxis is not required for the patient from a cardiac standpoint.  I will route this recommendation to the requesting party via Epic fax function and remove from pre-op pool.  Please call with questions.  Laurann Montana, PA-C 03/30/2021, 5:07 PM

## 2021-04-10 ENCOUNTER — Telehealth: Payer: BC Managed Care – PPO | Admitting: Nurse Practitioner

## 2021-04-10 DIAGNOSIS — J0100 Acute maxillary sinusitis, unspecified: Secondary | ICD-10-CM

## 2021-04-10 MED ORDER — AMOXICILLIN-POT CLAVULANATE 875-125 MG PO TABS: 1.0000 | ORAL_TABLET | Freq: Two times a day (BID) | ORAL | 0 refills | Status: DC

## 2021-04-10 NOTE — Progress Notes (Signed)

## 2021-05-13 ENCOUNTER — Encounter: Payer: Self-pay | Admitting: Family Medicine

## 2021-05-14 ENCOUNTER — Other Ambulatory Visit: Payer: Self-pay

## 2021-05-14 ENCOUNTER — Ambulatory Visit (INDEPENDENT_AMBULATORY_CARE_PROVIDER_SITE_OTHER): Payer: BC Managed Care – PPO

## 2021-05-14 DIAGNOSIS — Z23 Encounter for immunization: Secondary | ICD-10-CM

## 2021-05-16 ENCOUNTER — Other Ambulatory Visit: Payer: Self-pay | Admitting: Family Medicine

## 2021-05-16 MED ORDER — GABAPENTIN 100 MG PO CAPS: ORAL_CAPSULE | ORAL | 1 refills | Status: DC

## 2021-06-02 ENCOUNTER — Other Ambulatory Visit: Payer: Self-pay | Admitting: Family Medicine

## 2021-06-24 ENCOUNTER — Other Ambulatory Visit: Payer: Self-pay | Admitting: Family Medicine

## 2021-07-06 ENCOUNTER — Telehealth: Payer: Self-pay

## 2021-07-06 NOTE — Telephone Encounter (Signed)
Per my chart appt pt scheduled 07/13/20 at10:30 with Dr Para March for difficulty breathing with exertion. I spoke with pt; pt said on and off has difficulty breathing with exertion. Sometimes has weakness in chest feeling but not real pain. Pt said last few weeks has worsened. No H/A or dizziness. 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 Pt is just sitting around today and not having any problems now but earlier today walked to barn and had some difficulty breathing but not bad. UC & ED precautions given and pt voiced understanding. Pt said he is not that bad that he needs to go to [facility] or ED. I scheduled next available appt with Audria Nine NP on 07/08/21 at 11:40 at [facility] with Audria Nine NP. Pt did not cancel appt with Dr Para March in case he needs to FU with PCP next wk until sees [place]. Sending note to Dr Para March as PCP who is out of office today, Audria Nine NP who is out of office this afternoon and to Dr Reece Agar who is in office and Shanda Bumps CMA and Misty Stanley CMA who is in office and I will teams [place].

## 2021-07-06 NOTE — Telephone Encounter (Signed)
Noted. Thanks.

## 2021-07-08 ENCOUNTER — Ambulatory Visit: Payer: BC Managed Care – PPO | Admitting: Nurse Practitioner

## 2021-07-08 ENCOUNTER — Encounter: Payer: Self-pay | Admitting: Nurse Practitioner

## 2021-07-08 ENCOUNTER — Ambulatory Visit (INDEPENDENT_AMBULATORY_CARE_PROVIDER_SITE_OTHER)
Admission: RE | Admit: 2021-07-08 | Discharge: 2021-07-08 | Disposition: A | Discharge status: Home / Self Care | Payer: BC Managed Care – PPO | Source: Ambulatory Visit | Attending: Nurse Practitioner | Admitting: Nurse Practitioner

## 2021-07-08 ENCOUNTER — Other Ambulatory Visit: Payer: Self-pay

## 2021-07-08 VITALS — BP 132/84 | HR 94 | Temp 97.9°F | Resp 12 | Ht 75.0 in | Wt 295.0 lb

## 2021-07-08 DIAGNOSIS — R0609 Other forms of dyspnea: Secondary | ICD-10-CM

## 2021-07-08 LAB — COMPREHENSIVE METABOLIC PANEL
ALT: 12 U/L (ref 0–53)
AST: 13 U/L (ref 0–37)
Albumin: 4.2 g/dL (ref 3.5–5.2)
Alkaline Phosphatase: 105 U/L (ref 39–117)
BUN: 23 mg/dL (ref 6–23)
CO2: 32 mEq/L (ref 19–32)
Calcium: 9.9 mg/dL (ref 8.4–10.5)
Chloride: 101 mEq/L (ref 96–112)
Creatinine, Ser: 1.15 mg/dL (ref 0.40–1.50)
GFR: 68.34 mL/min (ref >60.00)
Glucose, Bld: 100 mg/dL — ABNORMAL HIGH (ref 70–99)
Potassium: 4.9 mEq/L (ref 3.5–5.1)
Sodium: 141 mEq/L (ref 135–145)
Total Bilirubin: 0.7 mg/dL (ref 0.2–1.2)
Total Protein: 7.4 g/dL (ref 6.0–8.3)

## 2021-07-08 LAB — CBC
HCT: 45.5 % (ref 39.0–52.0)
Hemoglobin: 15 g/dL (ref 13.0–17.0)
MCHC: 33 g/dL (ref 30.0–36.0)
MCV: 91.1 fl (ref 78.0–100.0)
Platelets: 343 10*3/uL (ref 150.0–400.0)
RBC: 5 Mil/uL (ref 4.22–5.81)
RDW: 13.1 % (ref 11.5–15.5)
WBC: 8.8 10*3/uL (ref 4.0–10.5)

## 2021-07-08 LAB — BRAIN NATRIURETIC PEPTIDE: Pro B Natriuretic peptide (BNP): 10 pg/mL (ref 0.0–100.0)

## 2021-07-08 NOTE — Assessment & Plan Note (Signed)
Patient also has been going on for extended period of time worse over the past 2 months.  States he can get winded walking into the kitchen making breakfast.  Did walk patient several laps around the office did he became winded and checked O2 sat at 97% lowest he got.  EKG in office normal sinus rhythm he does have cardiac comorbidities and I feel like a close follow-up with cardiology is warranted we will draw basic labs today.  Well score of 0 in office low likelihood of PE given signs and symptoms and well score.  Pending results.  ED precautions given

## 2021-07-08 NOTE — Patient Instructions (Signed)
Nice to see you today Keep appointment with Dr. Para March I will be in touch with the lab and xray results

## 2021-07-08 NOTE — Progress Notes (Signed)
Acute Office Visit  Subjective:    Patient ID: [name], male    DOB: 08/17/1960, 60 y.o.   MRN: [id number]  Chief Complaint  Patient presents with   Shortness of Breath    Has had issue with this for a long time but the past month or 2 feels worse. Mainly with exertion. Gets some back pain when he gets to been really out of breath. Has noticed SOB is worse with colder weather also.     Patient is in today for DOE  States that he has been having DOe for a couple months. States that over the last 2 months it has gotten worse. He states he got winded form the parking lot to the room in the office.  He seems to recover well. Does describe a heaviness when he gets winded and a tightness in his lower back but does have a history of back surgeries. Can get dizzy when he gets winded too.  Has a cardiologist. Sees them once a year.  Has dx of sleep apena. He states he has not been using his CPAP for approx 6-7 years. Feels tired at bedtime and not rested when he gets up  No history of DVT/PE. History of elevated D-Dimer with negative CTAs  Did walk patient two laps around office and he did become SHOB O2 sat only dropped to 97%  Past Medical History:  Diagnosis Date   Allergic rhinitis, cause unspecified    Chronic diastolic CHF (congestive heart failure) (HCC)    Degeneration of intervertebral disc, site unspecified    Diabetes mellitus    Hyperlipidemia    Hypertension    Impotence of organic origin    Mild CAD 2015   Obesity, unspecified    Obstructive sleep apnea (adult) (pediatric)    Other chronic allergic conjunctivitis    Personal history of colonic polyps     Past Surgical History:  Procedure Laterality Date   admission  01/2009   [place].  Syncopal event with nausea, diaphoresis, hypertension.  D-dimer elevated at 2.54 with negative CT chest. and VQ scan.  Cardiac enzymes negative x 3.  Labs normal.  Cardiology consult---cardiolite abn; cath, 2D-echo, holter  monitor unrevealing.  SE H&V.   Allergy Consult  07/11/2010   SOB, itching, facial tingling, hypotension.  Allergy testing: no food allergies; +reaction to tree pollen, grass pollen, dust mites, mold.  Avoid diclofenac and similar products.  [name].   APPENDECTOMY     arthroscopy of knee  1980   BACK SURGERY     x3   CATARACT EXTRACTION Left    COLONOSCOPY  07/11/2009   single polyp; repeat in 5 years.  [name].   GI consult  07/11/2010   CT chest in ED (gastric thickening).  [name].  No EGD warranted due to lack of symptoms.   LAPAROSCOPIC GASTRIC BANDING  09/15/09   LEFT HEART CATHETERIZATION WITH CORONARY ANGIOGRAM N/A 04/02/2014   Procedure: LEFT HEART CATHETERIZATION WITH CORONARY ANGIOGRAM;  Surgeon: Lesleigh Noe, MD;  Location: [facility] CATH LAB;  Service: Cardiovascular;  Laterality: N/A;   QUADRICEPS REPAIR     left   QUADRICEPS TENDON REPAIR     left   RETINAL DETACHMENT SURGERY     ROTATOR CUFF REPAIR     right   Sleep study  07/11/2010   repeat sleep study due to weight loss.  Severe OSA; CPAP titration to 8 cm.     SPINE SURGERY  TONSILLECTOMY AND ADENOIDECTOMY  1960    Family History  Problem Relation Age of Onset   Cancer Father        skin   Heart disease Father 15       AMI first age 84; stents.   Diabetes Father    Hypertension Father    Hyperlipidemia Father    Peripheral Artery Disease Father    Parkinson's disease Father    Cancer Paternal Grandfather        skin   Diabetes Mother    Arthritis Mother        total hip replacement   Kidney failure Mother    Depression Sister    Diabetes Sister    Cancer Maternal Uncle        lymphoma   Prostate cancer Maternal Uncle    Colon cancer Neg Hx     Social History   Socioeconomic History   Marital status: Married    Spouse name: Not on file   Number of children: 3   Years of education: Not on file   Highest education level: Not on file  Occupational History   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      Employment: employed.      Tobacco: never      Alcohol: beer twice per month on weekends.      Drugs: none      Exercise: walking three days per week.      Seatbelt: 100% of time      Guns: loaded and secured/locked.      Sunscreen: yes.   Social Determinants of Health   Financial Resource Strain: Not on file  Food Insecurity: Not on file  Transportation Needs: Not on file  Physical Activity: Not on file  Stress: Not on file  Social Connections: Not on file  Intimate Partner Violence: Not on file    Outpatient Medications Prior to Visit  Medication Sig Dispense Refill   albuterol (PROVENTIL HFA;VENTOLIN HFA) 108 (90 Base) MCG/ACT inhaler Inhale 1-2 puffs into the lungs every 6 (six) hours as needed for wheezing or shortness of breath. 1 Inhaler 1   aspirin EC 81 MG tablet Take 81 mg by mouth daily.     atorvastatin (LIPITOR) 80 MG tablet Take 1 tablet (80 mg total) by mouth daily. 90 tablet 3   Continuous Blood Gluc Receiver (DEXCOM G6 RECEIVER) DEVI      Continuous Blood Gluc Sensor (DEXCOM G6 SENSOR) MISC SMARTSIG:1 Each Topical Every 10 Days     Continuous Blood Gluc Transmit (DEXCOM G6 TRANSMITTER) MISC 1 Device by Does not apply route every 3 (three) months. 1 each 3   cyclobenzaprine (FLEXERIL) 5 MG tablet TAKE 1 TABLET BY MOUTH EVERY NIGHT  AT BEDTIME 30 tablet 1   Dulaglutide  (TRULICITY) 3 MG/0.5ML SOPN Inject 3 MG into the skin once a week 6 mL 1   FLUoxetine (PROZAC) 20 MG tablet TAKE 1 TABLET BY MOUTH ONCE A DAY 90 tablet 1   gabapentin (NEURONTIN) 100 MG capsule TAKE 1 TO 3 CAPSULES BY MOUTH 3 TIMES DAILY AS NEEDED 270 capsule 1   insulin aspart (NOVOLOG FLEXPEN) 100 UNIT/ML FlexPen Per sliding scale     insulin degludec (TRESIBA) 200 UNIT/ML FlexTouch Pen Inject 40 Units into the skin every evening.     JARDIANCE 25 MG TABS tablet TAKE 1 TABLET BY MOUTH ONCE A DAY 30 tablet 2   metFORMIN (GLUCOPHAGE) 1000 MG tablet TAKE 1 TABLET (1,000 MG TOTAL) BY MOUTH 2 (TWO) TIMES DAILY WITH A MEAL. 180 tablet 2   Multiple Vitamin (MULTIVITAMIN) capsule Take 1 capsule by mouth daily.     nebivolol (BYSTOLIC) 2.5 MG tablet Take 1 tablet (2.5 mg total) by mouth daily. 90 tablet 3   amoxicillin-clavulanate (AUGMENTIN) 875-125 MG tablet Take 1 tablet by mouth 2 (two) times daily. 14 tablet 0   No facility-administered medications prior to visit.    Allergies  Allergen Reactions   Diclofenac Sodium Shortness Of Breath, Itching and Other (See Comments)    Hypotension    Voltaren [Diclofenac Sodium] Anaphylaxis   Metoprolol     Lightheaded/dizzy/SOB.     Ramipril     Lightheaded/dizzy/SOB.      Review of Systems  Constitutional:  Negative for chills and fever.  Respiratory:  Positive for cough and shortness of breath.        Uses 2-3 pillows  and sleeps on his side.  Cardiovascular:  Negative for chest pain and leg swelling.       Has a heaviness when he is winded   Gastrointestinal:  Negative for diarrhea, nausea and vomiting.       2-3 times weekly  Neurological:  Positive for dizziness. Negative for weakness.      Objective:    Physical Exam Vitals and nursing note reviewed.  Constitutional:      Appearance: He is well-developed. He is obese.  Cardiovascular:     Rate and Rhythm: Normal rate and regular rhythm.  Pulmonary:     Effort: Pulmonary effort  is normal.     Breath sounds: Rhonchi (cleared with coughing and then breath sounds were clear) present.  Abdominal:     General: Bowel sounds are normal. There is no distension.     Palpations: There is no mass.     Tenderness: There is no abdominal tenderness.  Musculoskeletal:     Right lower leg: 1+ Pitting Edema present.     Left lower leg: 1+ Pitting Edema present.  Skin:    General: Skin is warm.  Neurological:     Mental Status: He is alert.  Psychiatric:        Mood and Affect: Mood normal.        Behavior: Behavior normal.        Thought Content: Thought content normal.        Judgment: Judgment normal.    BP 132/84    Pulse 94    Temp 97.9 F (36.6 C)    Resp 12    Ht 6\' 3"  (1.905 m)    Wt 295 lb (133.8 kg)    SpO2 98%    BMI 36.87 kg/m  Wt Readings from Last 3 Encounters:  07/08/21 295 lb (  133.8 kg)  02/26/21 300 lb (136.1 kg)  11/04/20 288 lb (130.6 kg)    Health Maintenance Due  Topic Date Due   Zoster Vaccines- Shingrix (1 of 2) Never done   COLONOSCOPY (Pts 45-45yrs Insurance coverage will need to be confirmed)  07/28/2019   COVID-19 Vaccine (3 - Moderna risk series) 10/29/2019   OPHTHALMOLOGY EXAM  05/30/2020   HEMOGLOBIN A1C  01/12/2021    There are no preventive care reminders to display for this patient.   Lab Results  Component Value Date   TSH 2.72 03/07/2017   Lab Results  Component Value Date   WBC 5.7 02/15/2021   HGB 13.8 02/15/2021   HCT 41.9 02/15/2021   MCV 88.4 02/15/2021   PLT 271.0 02/15/2021   Lab Results  Component Value Date   NA 140 02/15/2021   K 4.7 02/15/2021   CO2 29 02/15/2021   GLUCOSE 125 (H) 02/15/2021   BUN 19 02/15/2021   CREATININE 0.94 02/15/2021   BILITOT 0.5 02/15/2021   ALKPHOS 100 02/15/2021   AST 14 02/15/2021   ALT 12 02/15/2021   PROT 6.4 02/15/2021   ALBUMIN 3.6 02/15/2021   CALCIUM 8.7 02/15/2021   ANIONGAP 7 10/26/2017   GFR 87.29 02/15/2021   Lab Results  Component Value Date   CHOL 129  11/04/2020   Lab Results  Component Value Date   HDL 42 11/04/2020   Lab Results  Component Value Date   LDLCALC 72 11/04/2020   Lab Results  Component Value Date   TRIG 73 11/04/2020   Lab Results  Component Value Date   CHOLHDL 3.1 11/04/2020   Lab Results  Component Value Date   HGBA1C 9.5 (A) 07/15/2020       Assessment & Plan:   Problem List Items Addressed This Visit       Other   DOE (dyspnea on exertion) - Primary   Relevant Orders   EKG 12-Lead (Completed)   CBC (Completed)   Brain natriuretic peptide (Completed)   Comprehensive metabolic panel (Completed)   DG Chest 2 View     No orders of the defined types were placed in this encounter.  This visit occurred during the SARS-CoV-2 public health emergency.  Safety protocols were in place, including screening questions prior to the visit, additional usage of staff PPE, and extensive cleaning of exam room while observing appropriate contact time as indicated for disinfecting solutions.   Audria Nine, NP

## 2021-07-09 ENCOUNTER — Encounter: Payer: Self-pay | Admitting: Nurse Practitioner

## 2021-07-10 ENCOUNTER — Encounter: Payer: Self-pay | Admitting: Nurse Practitioner

## 2021-07-12 ENCOUNTER — Encounter: Payer: Self-pay | Admitting: Family Medicine

## 2021-07-13 ENCOUNTER — Ambulatory Visit: Payer: BC Managed Care – PPO | Admitting: Family Medicine

## 2021-07-19 ENCOUNTER — Encounter: Payer: Self-pay | Admitting: Family Medicine

## 2021-07-19 MED ORDER — ALBUTEROL SULFATE HFA 108 (90 BASE) MCG/ACT IN AERS: 1.0000 | INHALATION_SPRAY | Freq: Four times a day (QID) | RESPIRATORY_TRACT | 1 refills | Status: DC | PRN

## 2021-07-19 NOTE — Telephone Encounter (Signed)
Done

## 2021-07-21 ENCOUNTER — Other Ambulatory Visit: Payer: Self-pay | Admitting: Internal Medicine

## 2021-07-23 ENCOUNTER — Other Ambulatory Visit: Payer: Self-pay | Admitting: Internal Medicine

## 2021-07-27 ENCOUNTER — Telehealth: Payer: Self-pay

## 2021-07-27 ENCOUNTER — Other Ambulatory Visit (HOSPITAL_COMMUNITY): Payer: Self-pay

## 2021-07-27 NOTE — Telephone Encounter (Signed)
Patient Advocate Encounter   Received notification from [facility] that prior authorization for Dexcom G6 Receiver is required by his/her insurance [employer].   PA submitted on 07/27/21  Key#: [id number]  Status is pending    [facility] will continue to follow:  Patient Advocate Fax: [phone]

## 2021-07-28 ENCOUNTER — Other Ambulatory Visit (HOSPITAL_COMMUNITY): Payer: Self-pay

## 2021-07-28 NOTE — Telephone Encounter (Signed)
Patient Advocate Encounter  Prior Authorization for Dexcom G6 Receiver has been approved.    PA# [id number]  Effective dates: 07/27/21 through 07/27/22  Refill too soon  Spoke with Pharmacy to Process.  Patient Advocate Fax: [phone]

## 2021-07-29 ENCOUNTER — Encounter: Payer: Self-pay | Admitting: Family Medicine

## 2021-07-29 DIAGNOSIS — R0609 Other forms of dyspnea: Secondary | ICD-10-CM

## 2021-08-02 NOTE — Telephone Encounter (Signed)
Referral ordered

## 2021-08-04 ENCOUNTER — Other Ambulatory Visit: Payer: Self-pay

## 2021-08-04 ENCOUNTER — Encounter: Payer: Self-pay | Admitting: Pulmonary Disease

## 2021-08-04 ENCOUNTER — Ambulatory Visit: Payer: BC Managed Care – PPO | Admitting: Pulmonary Disease

## 2021-08-04 VITALS — BP 144/82 | HR 96 | Temp 98.5°F | Ht 73.0 in | Wt 295.0 lb

## 2021-08-04 DIAGNOSIS — I5032 Chronic diastolic (congestive) heart failure: Secondary | ICD-10-CM

## 2021-08-04 DIAGNOSIS — G4733 Obstructive sleep apnea (adult) (pediatric): Secondary | ICD-10-CM | POA: Diagnosis not present

## 2021-08-04 DIAGNOSIS — R0609 Other forms of dyspnea: Secondary | ICD-10-CM

## 2021-08-04 NOTE — Patient Instructions (Signed)
X ambulatory saturation  X check D-dimer  x schedule PFTs  Will await cardiology testing

## 2021-08-04 NOTE — Assessment & Plan Note (Signed)
Cause is not apparent on evaluation today.  He does not seem to have any evidence of ILD on chest x-ray or exam or previous CT imaging.  Previous PFTs have only shown effects of obesity and no evidence of airway obstruction.  Does not desaturate significantly on ambulation today. Symptoms are significantly involved standing that likelihood of pulm embolism is low, will rule this out with a D-dimer.  We will schedule for repeat PFTs but will await results of his cardiac evaluation

## 2021-08-04 NOTE — Progress Notes (Signed)
Subjective:    Patient ID: [name], male    DOB: 1960-07-23, 61 y.o.   MRN: [id number]  HPI   Chief Complaint  Patient presents with   Consult    Consult for difficultly breathing that started getting worse for the past 3 months. And wheezing is noted. He states that he has had difficult breathing since 2012. Just gotten worse and more noticeable recently     61 year old minimal smoker presents for evaluation of shortness of breath. He has morbid obesity status post lap band in 2011, weight dropped from 398 to his current weight of 298 pounds.  He underwent left heart cath in 2015 very it was noted that he had high LVEDP and started on Aldactone He was evaluated in 2016 in the pulmonary clinic by my partner Dr. Melvyn Novas, ACE inhibitor was stopped and dyspnea was attributed to obesity and deconditioning. He is accompanied by his wife Estill Bamberg who is in a wheelchair and corroborates symptoms. He reports worsening shortness of breath over the past 6 months especially on exertion and walking, resting makes it better, albuterol provides minimal relief.  He also reports increased dry cough for 1 month and intermittent wheezing especially when he sleeps. He has been referred back to cardiology and is awaiting an evaluation. I reviewed PCP visit from 12/29 He underwent chest x-ray, independently reviewed does not show any infiltrates or effusions, BNP was normal, hemoglobin 15, EKG did not show any ST/T wave changes  Other evaluation in the past includes CT renals from 10/2017 which shows clear lungs and a CT angiogram in 2015 which shows clear lungs and negative for pulm embolism. Echo in 03/2017 did not show any evidence of diastolic dysfunction and normal LVEF  He smoked for less than 5 pack years.  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  Significant tests/ events reviewed  - PFTs 07/15/14 with low erv only, c/w with effects of obesity   LHC 04/02/14 with LVEDP 22 by Dr  Tamala Julian > rec add aldactone  CTA 05/2014 negative pulmonary embolism, subtle groundglass densities in the lungs  Past Medical History:  Diagnosis Date   Allergic rhinitis, cause unspecified    Chronic diastolic CHF (congestive heart failure) (HCC)    Degeneration of intervertebral disc, site unspecified    Diabetes mellitus    Hyperlipidemia    Hypertension    Impotence of organic origin    Mild CAD 2015   Obesity, unspecified    Obstructive sleep apnea (adult) (pediatric)    Other chronic allergic conjunctivitis    Personal history of colonic polyps    Past Surgical History:  Procedure Laterality Date   admission  01/2009   [place].  Syncopal event with nausea, diaphoresis, hypertension.  D-dimer elevated at 2.54 with negative CT chest. and VQ scan.  Cardiac enzymes negative x 3.  Labs normal.  Cardiology consult---cardiolite abn; cath, 2D-echo, holter monitor unrevealing.  SE H&V.   Allergy Consult  07/11/2010   SOB, itching, facial tingling, hypotension.  Allergy testing: no food allergies; +reaction to tree pollen, grass pollen, dust mites, mold.  Avoid diclofenac and similar products.  [name].   APPENDECTOMY     arthroscopy of knee  1980   BACK SURGERY     x3   CATARACT EXTRACTION Left    COLONOSCOPY  07/11/2009   single polyp; repeat in 5 years.  [name].   GI consult  07/11/2010   CT chest in ED (gastric thickening).  [name].  No EGD warranted due to lack of symptoms.   LAPAROSCOPIC GASTRIC BANDING  09/15/09   LEFT HEART CATHETERIZATION WITH CORONARY ANGIOGRAM N/A 04/02/2014   Procedure: LEFT HEART CATHETERIZATION WITH CORONARY ANGIOGRAM;  Surgeon: Lesleigh Noe, MD;  Location: [facility] CATH LAB;  Service: Cardiovascular;  Laterality: N/A;   QUADRICEPS REPAIR     left   QUADRICEPS TENDON REPAIR     left   RETINAL DETACHMENT SURGERY     ROTATOR CUFF REPAIR     right   Sleep study  07/11/2010   repeat sleep study due to weight loss.  Severe OSA; CPAP titration to 8 cm.     SPINE  SURGERY     TONSILLECTOMY AND ADENOIDECTOMY  1960    Allergies  Allergen Reactions   Diclofenac Sodium Shortness Of Breath, Itching and Other (See Comments)    Hypotension    Voltaren [Diclofenac Sodium] Anaphylaxis   Metoprolol     Lightheaded/dizzy/SOB.     Ramipril     Lightheaded/dizzy/SOB.      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      Employment: employed.      Tobacco: never      Alcohol: beer twice per month on weekends.      Drugs: none      Exercise: walking three days per week.      Seatbelt: 100% of time      Guns: loaded and secured/locked.      Sunscreen: yes.   Social Determinants of Health   Financial Resource Strain: Not on file  Food Insecurity: Not on file  Transportation Needs: Not on file  Physical Activity: Not on file  Stress: Not on file  Social Connections: Not on file  Intimate Partner Violence: Not on file    Family History  Problem Relation Age of Onset   Cancer Father        skin   Heart disease Father 5       AMI first age 64; stents.   Diabetes Father    Hypertension Father    Hyperlipidemia Father    Peripheral Artery Disease  Father    Parkinson's disease Father    Cancer Paternal Grandfather        skin   Diabetes Mother    Arthritis Mother        total hip replacement   Kidney failure Mother    Depression Sister    Diabetes Sister  Cancer Maternal Uncle        lymphoma   Prostate cancer Maternal Uncle    Colon cancer Neg Hx       Review of Systems Constitutional: negative for anorexia, fevers and sweats  Eyes: negative for irritation, redness and visual disturbance  Ears, nose, mouth, throat, and face: negative for earaches, epistaxis, nasal congestion and sore throat  Respiratory: negative for sputum and wheezing  Cardiovascular: negative for chest pain,  lower extremity edema, orthopnea, palpitations and syncope  Gastrointestinal: negative for abdominal pain, constipation, diarrhea, melena, nausea and vomiting  Genitourinary:negative for dysuria, frequency and hematuria  Hematologic/lymphatic: negative for bleeding, easy bruising and lymphadenopathy  Musculoskeletal:negative for arthralgias, muscle weakness and stiff joints  Neurological: negative for coordination problems, gait problems, headaches and weakness  Endocrine: negative for diabetic symptoms including polydipsia, polyuria and weight loss     Objective:   Physical Exam  Gen. Pleasant, obese, in no distress, normal affect ENT - no pallor,icterus, no post nasal drip, class 2-3 airway Neck: No JVD, no thyromegaly, no carotid bruits Lungs: no use of accessory muscles, no dullness to percussion, decreased without rales or rhonchi  Cardiovascular: Rhythm regular, heart sounds  normal, no murmurs or gallops, no peripheral edema Abdomen: soft and non-tender, no hepatosplenomegaly, BS normal. Musculoskeletal: No deformities, no cyanosis or clubbing Neuro:  alert, non focal, no tremors       Assessment & Plan:

## 2021-08-04 NOTE — Assessment & Plan Note (Signed)
We may consider testing for sleep apnea based on results of evaluation but that is not the presenting complaint

## 2021-08-05 ENCOUNTER — Other Ambulatory Visit: Payer: BC Managed Care – PPO

## 2021-08-05 DIAGNOSIS — I5032 Chronic diastolic (congestive) heart failure: Secondary | ICD-10-CM

## 2021-08-05 LAB — D-DIMER, QUANTITATIVE: D-Dimer, Quant: 0.34 mcg/mL FEU (ref <0.50)

## 2021-08-06 ENCOUNTER — Encounter: Payer: Self-pay | Admitting: Nurse Practitioner

## 2021-08-06 ENCOUNTER — Ambulatory Visit: Payer: BC Managed Care – PPO | Admitting: Nurse Practitioner

## 2021-08-06 ENCOUNTER — Other Ambulatory Visit: Payer: Self-pay

## 2021-08-06 VITALS — BP 140/82 | HR 88 | Ht 73.0 in | Wt 294.0 lb

## 2021-08-06 DIAGNOSIS — I2510 Atherosclerotic heart disease of native coronary artery without angina pectoris: Secondary | ICD-10-CM

## 2021-08-06 DIAGNOSIS — Z01812 Encounter for preprocedural laboratory examination: Secondary | ICD-10-CM

## 2021-08-06 DIAGNOSIS — I10 Essential (primary) hypertension: Secondary | ICD-10-CM | POA: Diagnosis not present

## 2021-08-06 DIAGNOSIS — E785 Hyperlipidemia, unspecified: Secondary | ICD-10-CM | POA: Diagnosis not present

## 2021-08-06 DIAGNOSIS — I5032 Chronic diastolic (congestive) heart failure: Secondary | ICD-10-CM | POA: Diagnosis not present

## 2021-08-06 DIAGNOSIS — Z01818 Encounter for other preprocedural examination: Secondary | ICD-10-CM | POA: Diagnosis not present

## 2021-08-06 DIAGNOSIS — I200 Unstable angina: Secondary | ICD-10-CM | POA: Diagnosis not present

## 2021-08-06 DIAGNOSIS — I25110 Atherosclerotic heart disease of native coronary artery with unstable angina pectoris: Secondary | ICD-10-CM | POA: Diagnosis not present

## 2021-08-06 MED ORDER — NITROGLYCERIN 0.4 MG SL SUBL: 0.4000 mg | SUBLINGUAL_TABLET | SUBLINGUAL | 3 refills | Status: AC | PRN

## 2021-08-06 MED ORDER — NEBIVOLOL HCL 5 MG PO TABS: 5.0000 mg | ORAL_TABLET | Freq: Every day | ORAL | 2 refills | Status: DC

## 2021-08-06 NOTE — Progress Notes (Signed)
Cardiology Clinic Note   Patient Name: [name] Date of Encounter: 08/06/2021  Primary Care Provider:  Tonia Ghent, MD Primary Cardiologist:  Lauree Chandler, MD  Patient Profile    61 year old male with a history of nonobstructive CAD, HFpEF, hypertension, hyperlipidemia, diabetes, sleep apnea, and obesity, who presents for follow-up related to unstable angina.  Past Medical History    Past Medical History:  Diagnosis Date   Allergic rhinitis, cause unspecified    Chronic diastolic CHF (congestive heart failure) ([place])    a. 03/2017 Echo: EF 55%, Gr1DD, nl RV fxn.   Degeneration of intervertebral disc, site unspecified    Diabetes mellitus    Hyperlipidemia    Hypertension    Impotence of organic origin    Mild CAD    a. 03/2014 Cath: LM nl, LAD nl, D1 30ost/p, LCX nl, RCA 30d, EF 60%; b. 03/2017 MV: EF 56%, small, moderate basal inf/mid inf defect->prob inf thinning vs mild ischemia-->low-risk.   Obesity, unspecified    Obstructive sleep apnea (adult) (pediatric)    Other chronic allergic conjunctivitis    Personal history of colonic polyps    Past Surgical History:  Procedure Laterality Date   admission  01/2009   [facility].  Syncopal event with nausea, diaphoresis, hypertension.  D-dimer elevated at 2.54 with negative CT chest. and VQ scan.  Cardiac enzymes negative x 3.  Labs normal.  Cardiology consult---cardiolite abn; cath, 2D-echo, holter monitor unrevealing.  SE H&V.   Allergy Consult  07/11/2010   SOB, itching, facial tingling, hypotension.  Allergy testing: no food allergies; +reaction to tree pollen, grass pollen, dust mites, mold.  Avoid diclofenac and similar products.  [name].   APPENDECTOMY     arthroscopy of knee  1980   BACK SURGERY     x3   CATARACT EXTRACTION Left    COLONOSCOPY  07/11/2009   single polyp; repeat in 5 years.  [name].   GI consult  07/11/2010   CT chest in ED (gastric thickening).  [name].  No EGD warranted due to lack of  symptoms.   LAPAROSCOPIC GASTRIC BANDING  09/15/09   LEFT HEART CATHETERIZATION WITH CORONARY ANGIOGRAM N/A 04/02/2014   Procedure: LEFT HEART CATHETERIZATION WITH CORONARY ANGIOGRAM;  Surgeon: [name], MD;  Location: [facility] CATH LAB;  Service: Cardiovascular;  Laterality: N/A;   QUADRICEPS REPAIR     left   QUADRICEPS TENDON REPAIR     left   RETINAL DETACHMENT SURGERY     ROTATOR CUFF REPAIR     right   Sleep study  07/11/2010   repeat sleep study due to weight loss.  Severe OSA; CPAP titration to 8 cm.     SPINE SURGERY     TONSILLECTOMY AND ADENOIDECTOMY  1960    Allergies  Allergies  Allergen Reactions   Diclofenac Sodium Shortness Of Breath, Itching and Other (See Comments)    Hypotension    Voltaren [Diclofenac Sodium] Anaphylaxis   Metoprolol     Lightheaded/dizzy/SOB.     Ramipril     Lightheaded/dizzy/SOB.      History of Present Illness    61 year old male with above past medical history including nonobstructive CAD, HFpEF, hypertension, hyperlipidemia, diabetes, sleep apnea, and obesity status post lap band in 2011.  He previously underwent evaluation for chest pain 2015 with an abnormal Myoview showing an apical defect.  Diagnostic catheterization showed mild, nonobstructive diagonal and RCA disease and medical therapy was recommended.  Most recent stress testing was  performed in September 2018 showing probable inferior thinning versus mild ischemia however, this was overall deemed a low risk study and he has been medically managed.  He also underwent event monitoring September 2018, which showed a solitary 4 beat run of nonsustained VT and otherwise no significant arrhythmias.  Mr. Hefley was last seen in cardiology clinic in April 2022, at which time he was doing well.  He had lost 100 pounds in that setting, was no longer using CPAP.  Unfortunately, over the past 3 to 4 months, he has been experiencing progressive exertional dyspnea often associated with heaviness  across his chest, lasting 5 to 10 minutes, resolving with rest.  He notes that dyspnea occurs with minimal activity and it is accompanied by chest pressure a few days a week.  He has had to limit his activity in order to prevent both symptoms from occurring.  He denies palpitations, PND, orthopnea, dizziness, syncope, edema, or early satiety.  He has had a nonproductive cough over the past few months.  He was seen by pulmonology yesterday.  Previous chest x-ray December 29 did not show any acute cardiopulmonary process.    Home Medications    Current Outpatient Medications  Medication Sig Dispense Refill   albuterol (VENTOLIN HFA) 108 (90 Base) MCG/ACT inhaler Inhale 1-2 puffs into the lungs every 6 (six) hours as needed for wheezing or shortness of breath. 1 each 1   aspirin EC 81 MG tablet Take 81 mg by mouth daily.     atorvastatin (LIPITOR) 80 MG tablet Take 1 tablet (80 mg total) by mouth daily. 90 tablet 3   Continuous Blood Gluc Receiver (DEXCOM G6 RECEIVER) DEVI      Continuous Blood Gluc Sensor (DEXCOM G6 SENSOR) MISC SMARTSIG:1 Each Topical Every 10 Days     Continuous Blood Gluc Transmit (DEXCOM G6 TRANSMITTER) MISC 1 Device by Does not apply route every 3 (three) months. 1 each 3   cyclobenzaprine (FLEXERIL) 5 MG tablet TAKE 1 TABLET BY MOUTH EVERY NIGHT AT BEDTIME 30 tablet 1   Dulaglutide (TRULICITY) 3 0000000 SOPN Inject 3 MG into the skin once a week 6 mL 1   FLUoxetine (PROZAC) 20 MG tablet TAKE 1 TABLET BY MOUTH ONCE A DAY 90 tablet 1   gabapentin (NEURONTIN) 100 MG capsule TAKE 1 TO 3 CAPSULES BY MOUTH 3 TIMES DAILY AS NEEDED 270 capsule 1   insulin aspart (NOVOLOG FLEXPEN) 100 UNIT/ML FlexPen Per sliding scale     insulin degludec (TRESIBA) 200 UNIT/ML FlexTouch Pen Inject 40 Units into the skin every evening.     JARDIANCE 25 MG TABS tablet TAKE 1 TABLET BY MOUTH ONCE A DAY 30 tablet 2   metFORMIN (GLUCOPHAGE) 1000 MG tablet TAKE 1 TABLET (1,000 MG TOTAL) BY MOUTH 2 (TWO)  TIMES DAILY WITH A MEAL. 180 tablet 2   Multiple Vitamin (MULTIVITAMIN) capsule Take 1 capsule by mouth daily.     nebivolol (BYSTOLIC) 5 MG tablet Take 1 tablet (5 mg total) by mouth daily. 90 tablet 2   nitroGLYCERIN (NITROSTAT) 0.4 MG SL tablet Place 1 tablet (0.4 mg total) under the tongue every 5 (five) minutes as needed for chest pain. 25 tablet 3   No current facility-administered medications for this visit.     Family History    Family History  Problem Relation Age of Onset   Cancer Father        skin   Heart disease Father 62       AMI first  age 60; stents.   Diabetes Father    Hypertension Father    Hyperlipidemia Father    Peripheral Artery Disease Father    Parkinson's disease Father    Cancer Paternal Grandfather        skin   Diabetes Mother    Arthritis Mother        total hip replacement   Kidney failure Mother    Depression Sister    Diabetes Sister    Cancer Maternal Uncle        lymphoma   Prostate cancer Maternal Uncle    Colon cancer Neg Hx    He indicated that his mother is alive. He indicated that his father is deceased. He indicated that both of his sisters are alive. He indicated that his maternal grandmother is deceased. He indicated that his maternal grandfather is deceased. He indicated that his paternal grandmother is deceased. He indicated that his paternal grandfather is deceased. He indicated that his daughter is alive. He indicated that the status of his maternal uncle is unknown. He indicated that the status of his neg hx is unknown.  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      Employment: employed.      Tobacco: never      Alcohol: beer twice per month on weekends.      Drugs: none      Exercise: walking three days per week.      Seatbelt: 100% of time      Guns: loaded and secured/locked.      Sunscreen: yes.   Social Determinants of Health   Financial Resource Strain: Not on file  Food Insecurity: Not on file  Transportation Needs: Not on file  Physical Activity: Not on file  Stress: Not on file  Social Connections: Not on file  Intimate Partner Violence: Not on file    Review of Systems    General:  No chills, fever, night sweats or weight changes.  Cardiovascular:  +++ chest pain, +++ dyspnea on exertion, no edema, orthopnea, palpitations, paroxysmal nocturnal dyspnea. Dermatological: No rash, lesions/masses Respiratory: +++ Nonproductive cough, +++ dyspnea Urologic: No hematuria, dysuria Abdominal:   No nausea, vomiting, diarrhea, bright red blood per rectum, melena, or hematemesis Neurologic:  No visual changes, wkns, changes in mental status. All other systems reviewed and are otherwise negative except as noted above.     Physical Exam    VS:  BP 140/82 (BP  Location: Left Arm, Patient Position: Sitting, Cuff Size: Normal)    Pulse 88    Ht 6\' 1"  (1.854 m)    Wt 294 lb (133.4 kg)    SpO2 98%    BMI 38.79 kg/m  , BMI Body mass index is 38.79 kg/m.     GEN: Obese, in no acute distress. HEENT: normal. Neck: Supple, no JVD, carotid bruits, or masses. Cardiac: RRR, no murmurs,  rubs, or gallops. No clubbing, cyanosis, edema.  Radials/PT 2+ and equal bilaterally.  Respiratory:  Respirations regular and unlabored, clear to auscultation bilaterally. GI: Soft, nontender, nondistended, BS + x 4. MS: no deformity or atrophy. Skin: warm and dry, no rash. Neuro:  Strength and sensation are intact. Psych: Normal affect.  Accessory Clinical Findings    ECG personally reviewed by me today-regular sinus rhythm, 88 - No acute changes  Lab Results  Component Value Date   WBC 8.8 07/08/2021   HGB 15.0 07/08/2021   HCT 45.5 07/08/2021   MCV 91.1 07/08/2021   PLT 343.0 07/08/2021   Lab Results  Component Value Date   CREATININE 1.15 07/08/2021   BUN 23 07/08/2021   NA 141 07/08/2021   K 4.9 07/08/2021   CL 101 07/08/2021   CO2 32 07/08/2021   Lab Results  Component Value Date   ALT 12 07/08/2021   AST 13 07/08/2021   ALKPHOS 105 07/08/2021   BILITOT 0.7 07/08/2021   Lab Results  Component Value Date   CHOL 129 11/04/2020   HDL 42 11/04/2020   LDLCALC 72 11/04/2020   TRIG 73 11/04/2020   CHOLHDL 3.1 11/04/2020    Lab Results  Component Value Date   HGBA1C 9.5 (A) 07/15/2020    Assessment & Plan   1.  Unstable angina/CAD: Patient with a prior history of nonobstructive diagonal and RCA disease by catheterization September 2015 with low risk stress test in September 2018.  Since September 2022, he has been experiencing progressive exertional angina and chest tightness lasting 5 to 10 minutes resolving with rest.  Symptoms occur several times per week and he has begun limiting his activity as result.  We discussed options for management and we will plan on diagnostic R & L heart catheterization with Dr. Angelena Form next week.  The patient understands that risks include but are not limited to stroke (1 in 1000), death (1 in 66), kidney failure [usually temporary] (1 in 500), bleeding (1 in 200), allergic reaction [possibly serious] (1 in 200), and agrees to  proceed.  Continue aspirin, statin, and beta-blocker.  In the setting of hypertension I am going to increase his Bystolic to 5 mg daily.  I have also provided him with a prescription for sublingual nitroglycerin.  2.  Essential hypertension: Blood pressure 140/82.  Increasing Bystolic to 5 mg daily.  3.  Hyperlipidemia: LDL of 72 in April 2022.  Continue statin therapy.  Will likely need to escalate therapy post catheterization.  4.  Type 2 diabetes mellitus: A1c 9.5 earlier this month.  He is managed closely by primary care and is on Trulicity, Jardiance, Tresiba, and metformin.  5.  Morbid obesity: Despite losing 100 pounds, he remains 294 pounds.  Activity has been limited by chest pain and dyspnea.  Pending cath results, would likely benefit from cardiac rehabilitation.  6.  Chronic HFpEF:  as above, worsening dyspnea, though volume stable.  R & L heart cath as outlined above.  Adjusting ? blocker in setting of HTN.  7.  Disposition:  Follow-up CBC and basic metabolic panel today.  Plan diagnostic catheterization in [place] next week.  Follow-up in clinic here or in [place] in approximately 3 weeks.  Murray Hodgkins, NP 08/06/2021, 3:13 PM

## 2021-08-06 NOTE — H&P (View-Only) (Signed)
Cardiology Clinic Note   Patient Name: [name] Date of Encounter: 08/06/2021  Primary Care Provider:  Tonia Ghent, MD Primary Cardiologist:  [name] Chandler, MD  Patient Profile    61 year old male with a history of nonobstructive CAD, HFpEF, hypertension, hyperlipidemia, diabetes, sleep apnea, and obesity, who presents for follow-up related to unstable angina.  Past Medical History    Past Medical History:  Diagnosis Date   Allergic rhinitis, cause unspecified    Chronic diastolic CHF (congestive heart failure) ([place])    a. 03/2017 Echo: EF 55%, Gr1DD, nl RV fxn.   Degeneration of intervertebral disc, site unspecified    Diabetes mellitus    Hyperlipidemia    Hypertension    Impotence of organic origin    Mild CAD    a. 03/2014 Cath: LM nl, LAD nl, D1 30ost/p, LCX nl, RCA 30d, EF 60%; b. 03/2017 MV: EF 56%, small, moderate basal inf/mid inf defect->prob inf thinning vs mild ischemia-->low-risk.   Obesity, unspecified    Obstructive sleep apnea (adult) (pediatric)    Other chronic allergic conjunctivitis    Personal history of colonic polyps    Past Surgical History:  Procedure Laterality Date   admission  01/2009   [facility].  Syncopal event with nausea, diaphoresis, hypertension.  D-dimer elevated at 2.54 with negative CT chest. and VQ scan.  Cardiac enzymes negative x 3.  Labs normal.  Cardiology consult---cardiolite abn; cath, 2D-echo, holter monitor unrevealing.  SE H&V.   Allergy Consult  07/11/2010   SOB, itching, facial tingling, hypotension.  Allergy testing: no food allergies; +reaction to tree pollen, grass pollen, dust mites, mold.  Avoid diclofenac and similar products.  [name].   APPENDECTOMY     arthroscopy of knee  1980   BACK SURGERY     x3   CATARACT EXTRACTION Left    COLONOSCOPY  07/11/2009   single polyp; repeat in 5 years.  [name].   GI consult  07/11/2010   CT chest in ED (gastric thickening).  [name].  No EGD warranted due to lack of  symptoms.   LAPAROSCOPIC GASTRIC BANDING  09/15/09   LEFT HEART CATHETERIZATION WITH CORONARY ANGIOGRAM N/A 04/02/2014   Procedure: LEFT HEART CATHETERIZATION WITH CORONARY ANGIOGRAM;  Surgeon: [name] Grooms, MD;  Location: [facility] CATH LAB;  Service: Cardiovascular;  Laterality: N/A;   QUADRICEPS REPAIR     left   QUADRICEPS TENDON REPAIR     left   RETINAL DETACHMENT SURGERY     ROTATOR CUFF REPAIR     right   Sleep study  07/11/2010   repeat sleep study due to weight loss.  Severe OSA; CPAP titration to 8 cm.     SPINE SURGERY     TONSILLECTOMY AND ADENOIDECTOMY  1960    Allergies  Allergies  Allergen Reactions   Diclofenac Sodium Shortness Of Breath, Itching and Other (See Comments)    Hypotension    Voltaren [Diclofenac Sodium] Anaphylaxis   Metoprolol     Lightheaded/dizzy/SOB.     Ramipril     Lightheaded/dizzy/SOB.      History of Present Illness    61 year old male with above past medical history including nonobstructive CAD, HFpEF, hypertension, hyperlipidemia, diabetes, sleep apnea, and obesity status post lap band in 2011.  He previously underwent evaluation for chest pain 2015 with an abnormal Myoview showing an apical defect.  Diagnostic catheterization showed mild, nonobstructive diagonal and RCA disease and medical therapy was recommended.  Most recent stress testing was  performed in September 2018 showing probable inferior thinning versus mild ischemia however, this was overall deemed a low risk study and he has been medically managed.  He also underwent event monitoring September 2018, which showed a solitary 4 beat run of nonsustained VT and otherwise no significant arrhythmias.  [name] was last seen in cardiology clinic in April 2022, at which time he was doing well.  He had lost 100 pounds in that setting, was no longer using CPAP.  Unfortunately, over the past 3 to 4 months, he has been experiencing progressive exertional dyspnea often associated with heaviness  across his chest, lasting 5 to 10 minutes, resolving with rest.  He notes that dyspnea occurs with minimal activity and it is accompanied by chest pressure a few days a week.  He has had to limit his activity in order to prevent both symptoms from occurring.  He denies palpitations, PND, orthopnea, dizziness, syncope, edema, or early satiety.  He has had a nonproductive cough over the past few months.  He was seen by pulmonology yesterday.  Previous chest x-ray December 29 did not show any acute cardiopulmonary process.    Home Medications    Current Outpatient Medications  Medication Sig Dispense Refill   albuterol (VENTOLIN HFA) 108 (90 Base) MCG/ACT inhaler Inhale 1-2 puffs into the lungs every 6 (six) hours as needed for wheezing or shortness of breath. 1 each 1   aspirin EC 81 MG tablet Take 81 mg by mouth daily.     atorvastatin (LIPITOR) 80 MG tablet Take 1 tablet (80 mg total) by mouth daily. 90 tablet 3   Continuous Blood Gluc Receiver (DEXCOM G6 RECEIVER) DEVI      Continuous Blood Gluc Sensor (DEXCOM G6 SENSOR) MISC SMARTSIG:1 Each Topical Every 10 Days     Continuous Blood Gluc Transmit (DEXCOM G6 TRANSMITTER) MISC 1 Device by Does not apply route every 3 (three) months. 1 each 3   cyclobenzaprine (FLEXERIL) 5 MG tablet TAKE 1 TABLET BY MOUTH EVERY NIGHT AT BEDTIME 30 tablet 1   Dulaglutide (TRULICITY) 3 0000000 SOPN Inject 3 MG into the skin once a week 6 mL 1   FLUoxetine (PROZAC) 20 MG tablet TAKE 1 TABLET BY MOUTH ONCE A DAY 90 tablet 1   gabapentin (NEURONTIN) 100 MG capsule TAKE 1 TO 3 CAPSULES BY MOUTH 3 TIMES DAILY AS NEEDED 270 capsule 1   insulin aspart (NOVOLOG FLEXPEN) 100 UNIT/ML FlexPen Per sliding scale     insulin degludec (TRESIBA) 200 UNIT/ML FlexTouch Pen Inject 40 Units into the skin every evening.     JARDIANCE 25 MG TABS tablet TAKE 1 TABLET BY MOUTH ONCE A DAY 30 tablet 2   metFORMIN (GLUCOPHAGE) 1000 MG tablet TAKE 1 TABLET (1,000 MG TOTAL) BY MOUTH 2 (TWO)  TIMES DAILY WITH A MEAL. 180 tablet 2   Multiple Vitamin (MULTIVITAMIN) capsule Take 1 capsule by mouth daily.     nebivolol (BYSTOLIC) 5 MG tablet Take 1 tablet (5 mg total) by mouth daily. 90 tablet 2   nitroGLYCERIN (NITROSTAT) 0.4 MG SL tablet Place 1 tablet (0.4 mg total) under the tongue every 5 (five) minutes as needed for chest pain. 25 tablet 3   No current facility-administered medications for this visit.     Family History    Family History  Problem Relation Age of Onset   Cancer Father        skin   Heart disease Father 72       AMI first  age 75; stents.   Diabetes Father    Hypertension Father    Hyperlipidemia Father    Peripheral Artery Disease Father    Parkinson's disease Father    Cancer Paternal Grandfather        skin   Diabetes Mother    Arthritis Mother        total hip replacement   Kidney failure Mother    Depression Sister    Diabetes Sister    Cancer Maternal Uncle        lymphoma   Prostate cancer Maternal Uncle    Colon cancer Neg Hx    He indicated that his mother is alive. He indicated that his father is deceased. He indicated that both of his sisters are alive. He indicated that his maternal grandmother is deceased. He indicated that his maternal grandfather is deceased. He indicated that his paternal grandmother is deceased. He indicated that his paternal grandfather is deceased. He indicated that his daughter is alive. He indicated that the status of his maternal uncle is unknown. He indicated that the status of his neg hx is unknown.  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      Employment: employed.      Tobacco: never      Alcohol: beer twice per month on weekends.      Drugs: none      Exercise: walking three days per week.      Seatbelt: 100% of time      Guns: loaded and secured/locked.      Sunscreen: yes.   Social Determinants of Health   Financial Resource Strain: Not on file  Food Insecurity: Not on file  Transportation Needs: Not on file  Physical Activity: Not on file  Stress: Not on file  Social Connections: Not on file  Intimate Partner Violence: Not on file    Review of Systems    General:  No chills, fever, night sweats or weight changes.  Cardiovascular:  +++ chest pain, +++ dyspnea on exertion, no edema, orthopnea, palpitations, paroxysmal nocturnal dyspnea. Dermatological: No rash, lesions/masses Respiratory: +++ Nonproductive cough, +++ dyspnea Urologic: No hematuria, dysuria Abdominal:   No nausea, vomiting, diarrhea, bright red blood per rectum, melena, or hematemesis Neurologic:  No visual changes, wkns, changes in mental status. All other systems reviewed and are otherwise negative except as noted above.     Physical Exam    VS:  BP 140/82 (BP  Location: Left Arm, Patient Position: Sitting, Cuff Size: Normal)    Pulse 88    Ht 6\' 1"  (1.854 m)    Wt 294 lb (133.4 kg)    SpO2 98%    BMI 38.79 kg/m  , BMI Body mass index is 38.79 kg/m.     GEN: Obese, in no acute distress. HEENT: normal. Neck: Supple, no JVD, carotid bruits, or masses. Cardiac: RRR, no murmurs,  rubs, or gallops. No clubbing, cyanosis, edema.  Radials/PT 2+ and equal bilaterally.  Respiratory:  Respirations regular and unlabored, clear to auscultation bilaterally. GI: Soft, nontender, nondistended, BS + x 4. MS: no deformity or atrophy. Skin: warm and dry, no rash. Neuro:  Strength and sensation are intact. Psych: Normal affect.  Accessory Clinical Findings    ECG personally reviewed by me today-regular sinus rhythm, 88 - No acute changes  Lab Results  Component Value Date   WBC 8.8 07/08/2021   HGB 15.0 07/08/2021   HCT 45.5 07/08/2021   MCV 91.1 07/08/2021   PLT 343.0 07/08/2021   Lab Results  Component Value Date   CREATININE 1.15 07/08/2021   BUN 23 07/08/2021   NA 141 07/08/2021   K 4.9 07/08/2021   CL 101 07/08/2021   CO2 32 07/08/2021   Lab Results  Component Value Date   ALT 12 07/08/2021   AST 13 07/08/2021   ALKPHOS 105 07/08/2021   BILITOT 0.7 07/08/2021   Lab Results  Component Value Date   CHOL 129 11/04/2020   HDL 42 11/04/2020   LDLCALC 72 11/04/2020   TRIG 73 11/04/2020   CHOLHDL 3.1 11/04/2020    Lab Results  Component Value Date   HGBA1C 9.5 (A) 07/15/2020    Assessment & Plan   1.  Unstable angina/CAD: Patient with a prior history of nonobstructive diagonal and RCA disease by catheterization September 2015 with low risk stress test in September 2018.  Since September 2022, he has been experiencing progressive exertional angina and chest tightness lasting 5 to 10 minutes resolving with rest.  Symptoms occur several times per week and he has begun limiting his activity as result.  We discussed options for management and we will plan on diagnostic R & L heart catheterization with Dr. Angelena Form next week.  The patient understands that risks include but are not limited to stroke (1 in 1000), death (1 in 76), kidney failure [usually temporary] (1 in 500), bleeding (1 in 200), allergic reaction [possibly serious] (1 in 200), and agrees to  proceed.  Continue aspirin, statin, and beta-blocker.  In the setting of hypertension I am going to increase his Bystolic to 5 mg daily.  I have also provided him with a prescription for sublingual nitroglycerin.  2.  Essential hypertension: Blood pressure 140/82.  Increasing Bystolic to 5 mg daily.  3.  Hyperlipidemia: LDL of 72 in April 2022.  Continue statin therapy.  Will likely need to escalate therapy post catheterization.  4.  Type 2 diabetes mellitus: A1c 9.5 earlier this month.  He is managed closely by primary care and is on Trulicity, Jardiance, Tresiba, and metformin.  5.  Morbid obesity: Despite losing 100 pounds, he remains 294 pounds.  Activity has been limited by chest pain and dyspnea.  Pending cath results, would likely benefit from cardiac rehabilitation.  6.  Chronic HFpEF:  as above, worsening dyspnea, though volume stable.  R & L heart cath as outlined above.  Adjusting ? blocker in setting of HTN.  7.  Disposition:  Follow-up CBC and basic metabolic panel today.  Plan diagnostic catheterization in [place] next week.  Follow-up in clinic here or in [place] in approximately 3 weeks.  Murray Hodgkins, NP 08/06/2021, 3:13 PM

## 2021-08-06 NOTE — Patient Instructions (Addendum)
Medication Instructions:  - Your physician has recommended you make the following change in your medication:   1) INCREASE Bystolic to 5 mg: - take 1 tablet by mouth once daily   2) START Nitroglycerin 0.4 mg sublingual tablets  If you experience chest pain: Place 1 nitroglycerin under your tongue, while sitting. If no relief of pain, may repeat 1 tablet every 5 minutes up to 3 tablets over 15 minutes. If no relief call 911. If you have dizziness/lightheadedness while taking nitroglycerin, stop taking and call 911.    *If you need a refill on your cardiac medications before your next appointment, please call your pharmacy*   Lab Work: - Your physician recommends that you have lab work today: BMP/ CBC  If you have labs (blood work) drawn today and your tests are completely normal, you will receive your results only by: MyChart Message (if you have MyChart) OR A paper copy in the mail If you have any lab test that is abnormal or we need to change your treatment, we will call you to review the results.   Testing/Procedures:  1) Cardiac Catheterization: - Your physician has requested that you have a cardiac catheterization. Cardiac catheterization is used to diagnose and/or treat various heart conditions. Doctors may recommend this procedure for a number of different reasons. The most common reason is to evaluate chest pain. Chest pain can be a symptom of coronary artery disease (CAD), and cardiac catheterization can show whether plaque is narrowing or blocking your hearts arteries. This procedure is also used to evaluate the valves, as well as measure the blood flow and oxygen levels in different parts of your heart.   You are scheduled for a Cardiac Catheterization on Friday, February 3 with Dr. Verne Carrow.  1. Please arrive at the [facility] (Main Entrance A) at [facility]: [address] [place], [place] [address] at 8:30 AM (This time is two hours before your  procedure to ensure your preparation). Free valet parking service is available.   Special note: Every effort is made to have your procedure done on time. Please understand that emergencies sometimes delay scheduled procedures.  2. Diet: Do not eat solid foods after midnight.  You may have clear liquids until 5am upon the day of the procedure.  3. Labs: as above  4. Medication instructions in preparation for your procedure:   Contrast Allergy: No   Insulins: - take a 1/2 dose of your regular night time insulin the night prior to your procedure - do not take any insulin the morning of your procedure  Oral diabetic Medications: - HOLD ALL oral diabetic medications the morning of your procedure - METFORMIN will also need to be held for 48 hours after your procedure  On the morning of your procedure, take your Aspirin and any morning medicines NOT listed above.  You may use sips of water.  5. Plan for one night stay--bring personal belongings. 6. Bring a current list of your medications and current insurance cards. 7. You MUST have a responsible person to drive you home. 8. Someone MUST be with you the first 24 hours after you arrive home or your discharge will be delayed. 9. Please wear clothes that are easy to get on and off and wear slip-on shoes.  Thank you for allowing [name] to care for you!   -- [place] [facility]     Follow-Up: At [facility], you and your health needs are our priority.  As  part of our continuing mission to provide you with exceptional heart care, we have created designated Provider Care Teams.  These Care Teams include your primary Cardiologist (physician) and Advanced Practice Providers (APPs -  Physician Assistants and Nurse Practitioners) who all work together to provide you with the care you need, when you need it.  We recommend signing up for the patient portal called "MyChart".  Sign up information is provided on this After  Visit Summary.  MyChart is used to connect with patients for Virtual Visits (Telemedicine).  Patients are able to view lab/test results, encounter notes, upcoming appointments, etc.  Non-urgent messages can be sent to your provider as well.   To learn more about what you can do with MyChart, go to [email or web address].    Your next appointment:   3 week(s)  The format for your next appointment:   In Person  Provider:   Dr. Hinda Lenis, PA [facility] office) Nicolasa Ducking, NP [facility] office)    Other Instructions  Coronary Angiogram A coronary angiogram is an X-ray procedure that is used to examine the arteries in the heart. Contrast dye is injected through a long, thin tube (catheter) into these arteries. Then X-rays are taken to show any blockage in these arteries. You may have this procedure if you: Are having chest pain, or other symptoms of angina, and you are at risk for heart disease. Have an abnormal stress test or test of your heart's electrical activity (electrocardiogram, or ECG). Have chest pain and heart failure. Are having irregular heart rhythms. A coronary angiogram or heart catheterization can show if you have valve disease or a disease of the aorta. This procedure can also be used to check the overall function of your heart muscle. Let your health care provider know about: Any allergies you have, including allergies to medicines or contrast dye. All medicines you are taking, including vitamins, herbs, eye drops, creams, and over-the-counter medicines. Any problems you or family members have had with anesthetic medicines. Any blood disorders you have. Any surgeries you have had. Any history of kidney problems or kidney failure. Any medical conditions you have. Whether you are pregnant or may be pregnant. Whether you are breastfeeding. What are the risks? Generally, this is a safe procedure. However, problems may occur,  including: Infection. Allergic reaction to medicines or dyes that are used. Bleeding from the insertion site or other places. Damage to nearby structures, such as blood vessels, or damage to kidneys from contrast dye. Irregular heart rhythms. Stroke (rare). Heart attack (rare). What happens before the procedure? Staying hydrated Follow instructions from your health care provider about hydration, which may include: Up to 2 hours before the procedure - you may continue to drink clear liquids, such as water, clear fruit juice, black coffee, and plain tea.  Eating and drinking restrictions Follow instructions from your health care provider about eating and drinking, which may include: 8 hours before the procedure - stop eating heavy meals or foods, such as meat, fried foods, or fatty foods. 6 hours before the procedure - stop eating light meals or foods, such as toast or cereal. 6 hours before the procedure - stop drinking milk or drinks that contain milk. 2 hours before the procedure - stop drinking clear liquids. Medicines Ask your health care provider about: Changing or stopping your regular medicines. This is especially important if you are taking diabetes medicines or blood thinners. Taking medicines such as aspirin and ibuprofen. These medicines can thin your blood. Do  not take these medicines unless your health care provider tells you to take them. Aspirin may be recommended before coronary angiograms even if you do not normally take it. Taking over-the-counter medicines, vitamins, herbs, and supplements. General instructions Do not use any products that contain nicotine or tobacco for at least 4 weeks before the procedure. These products include cigarettes, e-cigarettes, and chewing tobacco. If you need help quitting, ask your health care provider. You may have an exam or testing. Plan to have someone take you home from the hospital or clinic. If you will be going home right after the  procedure, plan to have someone with you for 24 hours. Ask your health care provider: How your insertion site will be marked. What steps will be taken to help prevent infection. These may include: Removing hair at the insertion site. Washing skin with a germ-killing soap. Taking antibiotic medicine. What happens during the procedure?  You will lie on your back on an X-ray table. An IV will be inserted into one of your veins. Electrodes will be placed on your chest. You will be given one or more of the following: A medicine to help you relax (sedative). A medicine to numb the catheter insertion area (local anesthetic). You will be connected to a continuous ECG monitor. The catheter will be inserted into an artery in one of these areas: Your groin area in your upper thigh. Your wrist. The fold of your arm, near your elbow. An X-ray procedure (fluoroscopy) will be used to help guide the catheter to the opening of the blood vessel to be used. A dye will be injected into the catheter and X-rays will be taken. The dye will help to show any narrowing or blockages in the heart arteries. Tell your health care provider if you have chest pain or trouble breathing. If blockages are found, another procedure may be done to open the artery. The catheter will be removed after the fluoroscopy is complete. A bandage (dressing) will be placed over the insertion site. Pressure will be applied to stop bleeding. The IV will be removed. The procedure may vary among health care providers and hospitals. What happens after the procedure? Your blood pressure, heart rate, breathing rate, and blood oxygen level will be monitored until you leave the hospital or clinic. You will need to lie still for a few hours, or for as long as told by your health care provider. If the procedure is done through the groin, you will be told not to bend or cross your legs. The insertion site and the pulse in your foot or wrist will  be checked often. More blood tests, X-rays, and an ECG may be done. Do not drive for 24 hours if you were given a sedative during your procedure. Summary A coronary angiogram is an X-ray procedure that is used to examine the arteries in the heart. Contrast dye is injected through a long, thin tube (catheter) into each artery. Tell your health care provider about any allergies you have, including allergies to contrast dye. After the procedure, you will need to lie still for a few hours and drink plenty of fluids. This information is not intended to replace advice given to you by your health care provider. Make sure you discuss any questions you have with your health care provider. Document Revised: 01/17/2019 Document Reviewed: 01/17/2019 Elsevier Patient Education  2022 [employer]

## 2021-08-07 LAB — BASIC METABOLIC PANEL
BUN/Creatinine Ratio: 25 — ABNORMAL HIGH (ref 10–24)
BUN: 22 mg/dL (ref 8–27)
CO2: 28 mmol/L (ref 20–29)
Calcium: 9.9 mg/dL (ref 8.6–10.2)
Chloride: 103 mmol/L (ref 96–106)
Creatinine, Ser: 0.88 mg/dL (ref 0.76–1.27)
Glucose: 99 mg/dL (ref 70–99)
Potassium: 5.4 mmol/L — ABNORMAL HIGH (ref 3.5–5.2)
Sodium: 141 mmol/L (ref 134–144)
eGFR: 97 mL/min/{1.73_m2} (ref >59)

## 2021-08-07 LAB — CBC
Hematocrit: 48.9 % (ref 37.5–51.0)
Hemoglobin: 16.4 g/dL (ref 13.0–17.7)
MCH: 29.9 pg (ref 26.6–33.0)
MCHC: 33.5 g/dL (ref 31.5–35.7)
MCV: 89 fL (ref 79–97)
Platelets: 335 10*3/uL (ref 150–450)
RBC: 5.48 x10E6/uL (ref 4.14–5.80)
RDW: 12.5 % (ref 11.6–15.4)
WBC: 7 10*3/uL (ref 3.4–10.8)

## 2021-08-09 ENCOUNTER — Ambulatory Visit (HOSPITAL_BASED_OUTPATIENT_CLINIC_OR_DEPARTMENT_OTHER): Payer: BC Managed Care – PPO | Admitting: Family

## 2021-08-10 ENCOUNTER — Telehealth: Payer: Self-pay | Admitting: *Deleted

## 2021-08-10 DIAGNOSIS — Z01812 Encounter for preprocedural laboratory examination: Secondary | ICD-10-CM

## 2021-08-10 DIAGNOSIS — I25110 Atherosclerotic heart disease of native coronary artery with unstable angina pectoris: Secondary | ICD-10-CM

## 2021-08-10 DIAGNOSIS — I5032 Chronic diastolic (congestive) heart failure: Secondary | ICD-10-CM

## 2021-08-10 NOTE — Addendum Note (Signed)
Addended by: Bryna Colander on: 08/10/2021 09:25 AM   Modules accepted: Orders

## 2021-08-10 NOTE — Telephone Encounter (Signed)
Spoke with both patient and spouse via [employer] phone. Reviewed results and recommendations with patient and the request for repeat labs the morning of his procedure. Secure chat message sent to [facility] and her recommendations were for patient to arrive at 08:00 am to allow for labs to be done prior to his procedure. All of this information reviewed with patient and spouse and they were agreeable with plan. They both verbalized understanding with no further questions at this time.

## 2021-08-10 NOTE — Telephone Encounter (Signed)
-----   Message from Creig Hines, NP sent at 08/09/2021 12:51 PM EST ----- Potassium is mildly elevated.  Blood counts and kidney function are ok, and suitable for cath.  He is not on any potassium supplements.  Avoid high potassium foods.  F/u bmet on the morning of cath to ensure stability.

## 2021-08-10 NOTE — Telephone Encounter (Addendum)
Left voicemail message to call back for review of results and recommendations.   Patient will need to report for his cath at 08:00 am to allow for the labs to be done prior to his procedure.

## 2021-08-12 ENCOUNTER — Telehealth: Payer: Self-pay | Admitting: *Deleted

## 2021-08-12 NOTE — Telephone Encounter (Signed)
Cardiac catheterization scheduled at [facility] for: Friday August 13, 2021 10:30 AM [facility] Main Entrance [facility]) at: 8 AM-needs BMP   Diet-no solid food after midnight prior to cath, clear liquids until 5 AM day of procedure.  Medication instructions for procedure: -Hold:  Insulin-AM of procedure/1/2 usual Insulin HS prior to procedures  Metformin-day of procedure and 48 hours post procedure Jardiance-AM of procedure -Except hold medications usual morning medications can be taken pre-cath with sips of water including aspirin 81 mg.    Must have responsible adult to drive home post procedure and be with patient first 24 hours after arriving home.  [facility] does allow one visitor to accompany you and wait in the hospital waiting room while you are there for your procedure. You and your visitor will be asked to wear a mask once you enter the hospital.   Patient reports does not currently have any new symptoms concerning for COVID-19 and no household members with COVID-19 like illness.     Reviewed procedure/mask/visitor instructions with patient.

## 2021-08-13 ENCOUNTER — Encounter (HOSPITAL_COMMUNITY)
Admission: RE | Disposition: A | Discharge status: Home / Self Care | Payer: Self-pay | Source: Home / Self Care | Attending: Cardiovascular Disease

## 2021-08-13 ENCOUNTER — Ambulatory Visit (HOSPITAL_COMMUNITY)
Admission: RE | Admit: 2021-08-13 | Discharge: 2021-08-13 | Disposition: A | Discharge status: Home / Self Care | Payer: BC Managed Care – PPO | Attending: Cardiovascular Disease | Admitting: Cardiovascular Disease

## 2021-08-13 ENCOUNTER — Other Ambulatory Visit: Payer: Self-pay

## 2021-08-13 ENCOUNTER — Encounter (HOSPITAL_COMMUNITY): Payer: Self-pay | Admitting: Cardiovascular Disease

## 2021-08-13 DIAGNOSIS — Z79899 Other long term (current) drug therapy: Secondary | ICD-10-CM | POA: Insufficient documentation

## 2021-08-13 DIAGNOSIS — Z7982 Long term (current) use of aspirin: Secondary | ICD-10-CM | POA: Diagnosis not present

## 2021-08-13 DIAGNOSIS — R072 Precordial pain: Secondary | ICD-10-CM

## 2021-08-13 DIAGNOSIS — R0609 Other forms of dyspnea: Secondary | ICD-10-CM

## 2021-08-13 DIAGNOSIS — E119 Type 2 diabetes mellitus without complications: Secondary | ICD-10-CM | POA: Insufficient documentation

## 2021-08-13 DIAGNOSIS — I110 Hypertensive heart disease with heart failure: Secondary | ICD-10-CM | POA: Insufficient documentation

## 2021-08-13 DIAGNOSIS — E785 Hyperlipidemia, unspecified: Secondary | ICD-10-CM | POA: Diagnosis not present

## 2021-08-13 DIAGNOSIS — I5032 Chronic diastolic (congestive) heart failure: Secondary | ICD-10-CM | POA: Diagnosis not present

## 2021-08-13 DIAGNOSIS — Z794 Long term (current) use of insulin: Secondary | ICD-10-CM | POA: Diagnosis not present

## 2021-08-13 DIAGNOSIS — Z7985 Long-term (current) use of injectable non-insulin antidiabetic drugs: Secondary | ICD-10-CM | POA: Diagnosis not present

## 2021-08-13 DIAGNOSIS — I25110 Atherosclerotic heart disease of native coronary artery with unstable angina pectoris: Secondary | ICD-10-CM | POA: Diagnosis present

## 2021-08-13 DIAGNOSIS — I200 Unstable angina: Secondary | ICD-10-CM

## 2021-08-13 DIAGNOSIS — Z6838 Body mass index (BMI) 38.0-38.9, adult: Secondary | ICD-10-CM | POA: Insufficient documentation

## 2021-08-13 DIAGNOSIS — Z87891 Personal history of nicotine dependence: Secondary | ICD-10-CM | POA: Diagnosis not present

## 2021-08-13 DIAGNOSIS — G4733 Obstructive sleep apnea (adult) (pediatric): Secondary | ICD-10-CM | POA: Diagnosis not present

## 2021-08-13 DIAGNOSIS — I2510 Atherosclerotic heart disease of native coronary artery without angina pectoris: Secondary | ICD-10-CM | POA: Diagnosis not present

## 2021-08-13 DIAGNOSIS — Z7984 Long term (current) use of oral hypoglycemic drugs: Secondary | ICD-10-CM | POA: Diagnosis not present

## 2021-08-13 HISTORY — PX: RIGHT/LEFT HEART CATH AND CORONARY ANGIOGRAPHY: CATH118266

## 2021-08-13 LAB — POCT I-STAT EG7
Acid-Base Excess: 0 mmol/L (ref 0.0–2.0)
Bicarbonate: 27.9 mmol/L (ref 20.0–28.0)
Calcium, Ion: 1.22 mmol/L (ref 1.15–1.40)
HCT: 41 % (ref 39.0–52.0)
Hemoglobin: 13.9 g/dL (ref 13.0–17.0)
O2 Saturation: 77 %
Potassium: 3.9 mmol/L (ref 3.5–5.1)
Sodium: 142 mmol/L (ref 135–145)
TCO2: 30 mmol/L (ref 22–32)
pCO2, Ven: 55.5 mmHg (ref 44.0–60.0)
pH, Ven: 7.308 (ref 7.250–7.430)
pO2, Ven: 46 mmHg — ABNORMAL HIGH (ref 32.0–45.0)

## 2021-08-13 LAB — POCT I-STAT 7, (LYTES, BLD GAS, ICA,H+H)
Acid-Base Excess: 0 mmol/L (ref 0.0–2.0)
Bicarbonate: 27.3 mmol/L (ref 20.0–28.0)
Calcium, Ion: 1.21 mmol/L (ref 1.15–1.40)
HCT: 42 % (ref 39.0–52.0)
Hemoglobin: 14.3 g/dL (ref 13.0–17.0)
O2 Saturation: 99 %
Potassium: 4 mmol/L (ref 3.5–5.1)
Sodium: 141 mmol/L (ref 135–145)
TCO2: 29 mmol/L (ref 22–32)
pCO2 arterial: 52 mmHg — ABNORMAL HIGH (ref 32.0–48.0)
pH, Arterial: 7.328 — ABNORMAL LOW (ref 7.350–7.450)
pO2, Arterial: 140 mmHg — ABNORMAL HIGH (ref 83.0–108.0)

## 2021-08-13 LAB — BASIC METABOLIC PANEL
Anion gap: 8 (ref 5–15)
BUN: 18 mg/dL (ref 8–23)
CO2: 28 mmol/L (ref 22–32)
Calcium: 8.9 mg/dL (ref 8.9–10.3)
Chloride: 103 mmol/L (ref 98–111)
Creatinine, Ser: 0.87 mg/dL (ref 0.61–1.24)
GFR, Estimated: >60 mL/min (ref >60)
Glucose, Bld: 84 mg/dL (ref 70–99)
Potassium: 4.1 mmol/L (ref 3.5–5.1)
Sodium: 139 mmol/L (ref 135–145)

## 2021-08-13 LAB — GLUCOSE, CAPILLARY
Glucose-Capillary: 89 mg/dL (ref 70–99)
Glucose-Capillary: 77 mg/dL (ref 70–99)

## 2021-08-13 SURGERY — RIGHT/LEFT HEART CATH AND CORONARY ANGIOGRAPHY
Anesthesia: LOCAL

## 2021-08-13 MED ORDER — HEPARIN (PORCINE) IN NACL 1000-0.9 UT/500ML-% IV SOLN
INTRAVENOUS | Status: DC | PRN
  Administered 2021-08-13 (×2): 500 mL

## 2021-08-13 MED ORDER — HYDRALAZINE HCL 20 MG/ML IJ SOLN: 10.0000 mg | INTRAMUSCULAR | Status: DC | PRN

## 2021-08-13 MED ORDER — MIDAZOLAM HCL 2 MG/2ML IJ SOLN
INTRAMUSCULAR | Status: AC
  Filled 2021-08-13: qty 2

## 2021-08-13 MED ORDER — ACETAMINOPHEN 325 MG PO TABS: 650.0000 mg | ORAL_TABLET | ORAL | Status: DC | PRN

## 2021-08-13 MED ORDER — ASPIRIN 81 MG PO CHEW: 81.0000 mg | CHEWABLE_TABLET | ORAL | Status: DC

## 2021-08-13 MED ORDER — SODIUM CHLORIDE 0.9% FLUSH: 3.0000 mL | Freq: Two times a day (BID) | INTRAVENOUS | Status: DC

## 2021-08-13 MED ORDER — VERAPAMIL HCL 2.5 MG/ML IV SOLN
INTRAVENOUS | Status: AC
  Filled 2021-08-13: qty 2

## 2021-08-13 MED ORDER — HEPARIN (PORCINE) IN NACL 1000-0.9 UT/500ML-% IV SOLN
INTRAVENOUS | Status: AC
  Filled 2021-08-13: qty 1000

## 2021-08-13 MED ORDER — SODIUM CHLORIDE 0.9 % IV SOLN: 250.0000 mL | INTRAVENOUS | Status: DC | PRN

## 2021-08-13 MED ORDER — LIDOCAINE HCL (PF) 1 % IJ SOLN
INTRAMUSCULAR | Status: DC | PRN
  Administered 2021-08-13 (×2): 2 mL

## 2021-08-13 MED ORDER — VERAPAMIL HCL 2.5 MG/ML IV SOLN
INTRAVENOUS | Status: DC | PRN
  Administered 2021-08-13: 10 mL via INTRA_ARTERIAL

## 2021-08-13 MED ORDER — LIDOCAINE HCL (PF) 1 % IJ SOLN
INTRAMUSCULAR | Status: AC
  Filled 2021-08-13: qty 30

## 2021-08-13 MED ORDER — FENTANYL CITRATE (PF) 100 MCG/2ML IJ SOLN
INTRAMUSCULAR | Status: AC
  Filled 2021-08-13: qty 2

## 2021-08-13 MED ORDER — MIDAZOLAM HCL 2 MG/2ML IJ SOLN
INTRAMUSCULAR | Status: DC | PRN
  Administered 2021-08-13: 2 mg via INTRAVENOUS

## 2021-08-13 MED ORDER — FENTANYL CITRATE (PF) 100 MCG/2ML IJ SOLN
INTRAMUSCULAR | Status: DC | PRN
  Administered 2021-08-13: 50 ug via INTRAVENOUS

## 2021-08-13 MED ORDER — SODIUM CHLORIDE 0.9% FLUSH: 3.0000 mL | INTRAVENOUS | Status: DC | PRN

## 2021-08-13 MED ORDER — SODIUM CHLORIDE 0.9 % WEIGHT BASED INFUSION
3.0000 mL/kg/h | INTRAVENOUS | Status: AC
  Administered 2021-08-13: 3 mL/kg/h via INTRAVENOUS

## 2021-08-13 MED ORDER — SODIUM CHLORIDE 0.9 % IV SOLN: INTRAVENOUS | Status: DC

## 2021-08-13 MED ORDER — HEPARIN SODIUM (PORCINE) 1000 UNIT/ML IJ SOLN
INTRAMUSCULAR | Status: AC
  Filled 2021-08-13: qty 10

## 2021-08-13 MED ORDER — ONDANSETRON HCL 4 MG/2ML IJ SOLN: 4.0000 mg | Freq: Four times a day (QID) | INTRAMUSCULAR | Status: DC | PRN

## 2021-08-13 MED ORDER — IOHEXOL 350 MG/ML SOLN
INTRAVENOUS | Status: DC | PRN
  Administered 2021-08-13: 55 mL via INTRA_ARTERIAL

## 2021-08-13 MED ORDER — SODIUM CHLORIDE 0.9 % WEIGHT BASED INFUSION: 1.0000 mL/kg/h | INTRAVENOUS | Status: DC

## 2021-08-13 MED ORDER — HEPARIN SODIUM (PORCINE) 1000 UNIT/ML IJ SOLN
INTRAMUSCULAR | Status: DC | PRN
  Administered 2021-08-13: 6000 [IU] via INTRAVENOUS

## 2021-08-13 SURGICAL SUPPLY — 12 items
CATH 5FR JL3.5 JR4 ANG PIG MP (CATHETERS) ×1 IMPLANT
CATH SWAN GANZ 7F STRAIGHT (CATHETERS) ×1 IMPLANT
DEVICE RAD COMP TR BAND LRG (VASCULAR PRODUCTS) ×1 IMPLANT
GLIDESHEATH SLEND SS 6F .021 (SHEATH) ×1 IMPLANT
GLIDESHEATH SLENDER 7FR .021G (SHEATH) ×1 IMPLANT
GUIDEWIRE .025 260CM (WIRE) ×1 IMPLANT
GUIDEWIRE INQWIRE 1.5J.035X260 (WIRE) IMPLANT
INQWIRE 1.5J .035X260CM (WIRE) ×2
KIT HEART LEFT (KITS) ×3 IMPLANT
PACK CARDIAC CATHETERIZATION (CUSTOM PROCEDURE TRAY) ×3 IMPLANT
TRANSDUCER W/STOPCOCK (MISCELLANEOUS) ×3 IMPLANT
TUBING CIL FLEX 10 FLL-RA (TUBING) ×3 IMPLANT

## 2021-08-13 NOTE — Interval H&P Note (Signed)
History and Physical Interval Note:  08/13/2021 8:36 AM  [name]  has presented today for surgery, with the diagnosis of unstable angina - shortness of breath.  The various methods of treatment have been discussed with the patient and family. After consideration of risks, benefits and other options for treatment, the patient has consented to  Procedure(s): RIGHT/LEFT HEART CATH AND CORONARY ANGIOGRAPHY (N/A) as a surgical intervention.  The patient's history has been reviewed, patient examined, no change in status, stable for surgery.  I have reviewed the patient's chart and labs.  Questions were answered to the patient's satisfaction.    Cath Lab Visit (complete for each Cath Lab visit)  Clinical Evaluation Leading to the Procedure:   ACS: No.  Non-ACS:    Anginal Classification: CCS III  Anti-ischemic medical therapy: Minimal Therapy (1 class of medications)  Non-Invasive Test Results: No non-invasive testing performed  Prior CABG: No previous CABG        Lauree Chandler

## 2021-08-25 ENCOUNTER — Ambulatory Visit: Payer: BC Managed Care – PPO | Admitting: Nurse Practitioner

## 2021-08-25 ENCOUNTER — Encounter: Payer: Self-pay | Admitting: Nurse Practitioner

## 2021-08-25 ENCOUNTER — Other Ambulatory Visit: Payer: Self-pay

## 2021-08-25 VITALS — BP 118/62 | HR 88 | Ht 73.0 in | Wt 297.0 lb

## 2021-08-25 DIAGNOSIS — E785 Hyperlipidemia, unspecified: Secondary | ICD-10-CM

## 2021-08-25 DIAGNOSIS — I2510 Atherosclerotic heart disease of native coronary artery without angina pectoris: Secondary | ICD-10-CM

## 2021-08-25 DIAGNOSIS — R0609 Other forms of dyspnea: Secondary | ICD-10-CM | POA: Diagnosis not present

## 2021-08-25 DIAGNOSIS — I10 Essential (primary) hypertension: Secondary | ICD-10-CM

## 2021-08-25 DIAGNOSIS — G4733 Obstructive sleep apnea (adult) (pediatric): Secondary | ICD-10-CM

## 2021-08-25 DIAGNOSIS — I5032 Chronic diastolic (congestive) heart failure: Secondary | ICD-10-CM

## 2021-08-25 NOTE — Patient Instructions (Signed)
Medication Instructions:  Your physician recommends that you continue on your current medications as directed. Please refer to the Current Medication list given to you today.   *If you need a refill on your cardiac medications before your next appointment, please call your pharmacy*   Lab Work: None ordered  If you have labs (blood work) drawn today and your tests are completely normal, you will receive your results only by: [place] (if you have MyChart) OR A paper copy in the mail If you have any lab test that is abnormal or we need to change your treatment, we will call you to review the results.   Testing/Procedures: None ordered   Follow-Up: At [facility], you and your health needs are our priority.  As part of our continuing mission to provide you with exceptional heart care, we have created designated Provider Care Teams.  These Care Teams include your primary Cardiologist (physician) and Advanced Practice Providers (APPs -  Physician Assistants and Nurse Practitioners) who all work together to provide you with the care you need, when you need it.  We recommend signing up for the patient portal called "MyChart".  Sign up information is provided on this After Visit Summary.  MyChart is used to connect with patients for Virtual Visits (Telemedicine).  Patients are able to view lab/test results, encounter notes, upcoming appointments, etc.  Non-urgent messages can be sent to your provider as well.   To learn more about what you can do with MyChart, go to [email or web address].    Your next appointment:   6 month(s)  The format for your next appointment:   In Person  Provider:   Lauree Chandler, MD     Other Instructions N/A

## 2021-08-25 NOTE — Progress Notes (Signed)
Office Visit    Patient Name: [name] Date of Encounter: 08/25/2021  Primary Care Provider:  Joaquim Namuncan, Graham S, MD Primary Cardiologist:  Verne Carrowhristopher McAlhany, MD  Chief Complaint    61 year old male with a history of nonobstructive CAD, HFpEF, hypertension, hyperlipidemia, diabetes, sleep apnea, and obesity, who presents for follow-up after recent diagnostic catheterization.  Past Medical History    Past Medical History:  Diagnosis Date   Allergic rhinitis, cause unspecified    Chronic diastolic CHF (congestive heart failure) (HCC)    a. 03/2017 Echo: EF 55%, Gr1DD, nl RV fxn.   Degeneration of intervertebral disc, site unspecified    Diabetes mellitus    Hyperlipidemia    Hypertension    Impotence of organic origin    Nonobstructive CAD    a. 03/2014 Cath: LM nl, LAD nl, D1 30ost/p, LCX nl, RCA 30d, EF 60%; b. 03/2017 MV: EF 56%, small, moderate basal inf/mid inf defect->prob inf thinning vs mild ischemia-->low-risk; c. 08/2021 Cath: LM nl, LAD 20ost/p, LCX large, 3857m, RCA large, 30d, EF 50-55%. Nl filling pressures-->Med rx.   Obesity, unspecified    Obstructive sleep apnea (adult) (pediatric)    Other chronic allergic conjunctivitis    Personal history of colonic polyps    Past Surgical History:  Procedure Laterality Date   [facility]  Syncopal event with nausea, diaphoresis, hypertension.  D-dimer elevated at 2.54 with negative CT chest. and VQ scan.  Cardiac enzymes negative x 3.  Labs normal.  Cardiology consult---cardiolite abn; cath, 2D-echo, holter monitor unrevealing.  SE H&V.   Allergy Consult  07/11/2010   SOB, itching, facial tingling, hypotension.  Allergy testing: no food allergies; +reaction to tree pollen, grass pollen, dust mites, mold.  Avoid diclofenac and similar products.  [name].   APPENDECTOMY     arthroscopy of knee  1980   BACK SURGERY     x3   CATARACT EXTRACTION Left    COLONOSCOPY  07/11/2009   single polyp; repeat in 5  years.  [name].   GI consult  07/11/2010   CT chest in ED (gastric thickening).  [name].  No EGD warranted due to lack of symptoms.   LAPAROSCOPIC GASTRIC BANDING  09/15/09   LEFT HEART CATHETERIZATION WITH CORONARY ANGIOGRAM N/A 04/02/2014   Procedure: LEFT HEART CATHETERIZATION WITH CORONARY ANGIOGRAM;  Surgeon: Lesleigh NoeHenry W Smith III, MD;  [facility];  Service: Cardiovascular;  Laterality: N/A;   QUADRICEPS REPAIR     left   QUADRICEPS TENDON REPAIR     left   RETINAL DETACHMENT SURGERY     RIGHT/LEFT HEART CATH AND CORONARY ANGIOGRAPHY N/A 08/13/2021   Procedure: RIGHT/LEFT HEART CATH AND CORONARY ANGIOGRAPHY;  Surgeon: Kathleene HazelMcAlhany, Jacklynn Dehaas D, MD;  Location: [facility] INVASIVE CV LAB;  Service: Cardiovascular;  Laterality: N/A;   ROTATOR CUFF REPAIR     right   Sleep study  07/11/2010   repeat sleep study due to weight loss.  Severe OSA; CPAP titration to 8 cm.     SPINE SURGERY     TONSILLECTOMY AND ADENOIDECTOMY  1960    Allergies  Allergies  Allergen Reactions   Diclofenac Sodium Shortness Of Breath, Itching and Other (See Comments)    Hypotension    Voltaren [Diclofenac Sodium] Anaphylaxis   Metoprolol     Lightheaded/dizzy/SOB.     Ramipril     Lightheaded/dizzy/SOB.      History of Present Illness    61 year old male with above past  medical history including nonobstructive CAD, HFpEF, hypertension, hyperlipidemia, diabetes, sleep apnea, and obesity status post lap band in 2011.  He previously underwent evaluation for chest pain in 2015 with an abnormal Myoview showing an apical defect.  Diagnostic catheterization showed mild, nonobstructive diagonal and RCA disease, and he was medically managed.  Stress testing in September 2018 showed probable inferior thinning versus mild ischemia however, this was overall deemed a low risk study and he was medically managed.  He underwent event monitoring in September 2018, which showed a solitary, 4 beat run of nonsustained VT, and otherwise  no significant arrhythmias.  Mr. Kille was last seen in cardiology clinic on January 27, at which time he reported a 3 to 50-month history of progressive exertional dyspnea and chest heaviness, lasting 5 to 10 minutes, resolving with rest.  Labs were unremarkable and he underwent right and left heart diagnostic catheterization in early February with Dr. Clifton James, which showed mild nonobstructive CAD in the LAD, circumflex, and RCA, with normal LV function and normal right and left heart filling pressures.  Continued medical therapy was recommended.  Since his catheterization, he has not had any significant chest discomfort but continues to have intermittent dyspnea on exertion.  He notes that he has good days and bad days.  Last week, he felt like he had good energy and was not experiencing lots of dyspnea however yesterday, he was more short of breath throughout the day.  His wife is not present with him today but is on the phone and notes that sometimes he wheezes during sleep.  He also snores.  He has a history of sleep apnea but came off of CPAP following weight loss.  Patient does note daytime sleepiness and fatigue.  He is unaware of wheezing throughout the day or when he becomes dyspneic.  He denies palpitations, PND, orthopnea, dizziness, syncope, edema, or early satiety.  He has follow-up with pulmonology next month.  Home Medications    Current Outpatient Medications  Medication Sig Dispense Refill   albuterol (VENTOLIN HFA) 108 (90 Base) MCG/ACT inhaler Inhale 1-2 puffs into the lungs every 6 (six) hours as needed for wheezing or shortness of breath. 1 each 1   aspirin EC 81 MG tablet Take 81 mg by mouth daily.     atorvastatin (LIPITOR) 80 MG tablet Take 1 tablet (80 mg total) by mouth daily. 90 tablet 3   Continuous Blood Gluc Receiver (DEXCOM G6 RECEIVER) DEVI      Continuous Blood Gluc Sensor (DEXCOM G6 SENSOR) MISC SMARTSIG:1 Each Topical Every 10 Days     Continuous Blood Gluc Transmit  (DEXCOM G6 TRANSMITTER) MISC 1 Device by Does not apply route every 3 (three) months. 1 each 3   cyclobenzaprine (FLEXERIL) 5 MG tablet TAKE 1 TABLET BY MOUTH EVERY NIGHT AT BEDTIME 30 tablet 1   Dulaglutide (TRULICITY) 4.5 MG/0.5ML SOPN Inject 4.5 mg into the skin once a week.     FLUoxetine (PROZAC) 20 MG tablet TAKE 1 TABLET BY MOUTH ONCE A DAY 90 tablet 1   gabapentin (NEURONTIN) 100 MG capsule TAKE 1 TO 3 CAPSULES BY MOUTH 3 TIMES DAILY AS NEEDED 270 capsule 1   insulin aspart (NOVOLOG FLEXPEN) 100 UNIT/ML FlexPen Per sliding scale     insulin degludec (TRESIBA) 200 UNIT/ML FlexTouch Pen Inject 40 Units into the skin every evening.     JARDIANCE 25 MG TABS tablet TAKE 1 TABLET BY MOUTH ONCE A DAY 30 tablet 2   metFORMIN (GLUCOPHAGE) 1000 MG  tablet TAKE 1 TABLET (1,000 MG TOTAL) BY MOUTH 2 (TWO) TIMES DAILY WITH A MEAL. 180 tablet 2   Multiple Vitamin (MULTIVITAMIN) capsule Take 1 capsule by mouth daily.     nebivolol (BYSTOLIC) 5 MG tablet Take 1 tablet (5 mg total) by mouth daily. 90 tablet 2   nitroGLYCERIN (NITROSTAT) 0.4 MG SL tablet Place 1 tablet (0.4 mg total) under the tongue every 5 (five) minutes as needed for chest pain. 25 tablet 3   No current facility-administered medications for this visit.     Review of Systems    Ongoing dyspnea on exertion as outlined above.  Daytime fatigue and sleepiness.  He denies chest pain, palpitations, PND, orthopnea, dizziness, syncope, edema, or satiety.  All other systems reviewed and are otherwise negative except as noted above.    Physical Exam    VS:  BP 118/62 (BP Location: Left Arm, Patient Position: Sitting, Cuff Size: Large)    Pulse 88    Ht 6\' 1"  (1.854 m)    Wt 297 lb (134.7 kg)    SpO2 98%    BMI 39.18 kg/m  , BMI Body mass index is 39.18 kg/m.     GEN: Obese, in no acute distress. HEENT: normal. Neck: Supple, no JVD, carotid bruits, or masses. Cardiac: RRR, no murmurs, rubs, or gallops. No clubbing, cyanosis, edema.  Radials  2+/PT 1+ and equal bilaterally.  Right radial catheterization site without bleeding, bruit, or hematoma. Respiratory:  Respirations regular and unlabored, clear to auscultation bilaterally. GI: Obese, soft, nontender, nondistended, BS + x 4. MS: no deformity or atrophy. Skin: warm and dry, no rash. Neuro:  Strength and sensation are intact. Psych: Normal affect.  Accessory Clinical Findings     Lab Results  Component Value Date   WBC 7.0 08/06/2021   HGB 13.9 08/13/2021   HCT 41.0 08/13/2021   MCV 89 08/06/2021   PLT 335 08/06/2021   Lab Results  Component Value Date   CREATININE 0.87 08/13/2021   BUN 18 08/13/2021   NA 142 08/13/2021   K 3.9 08/13/2021   CL 103 08/13/2021   CO2 28 08/13/2021   Lab Results  Component Value Date   ALT 12 07/08/2021   AST 13 07/08/2021   ALKPHOS 105 07/08/2021   BILITOT 0.7 07/08/2021   Lab Results  Component Value Date   CHOL 129 11/04/2020   HDL 42 11/04/2020   LDLCALC 72 11/04/2020   TRIG 73 11/04/2020   CHOLHDL 3.1 11/04/2020    Lab Results  Component Value Date   HGBA1C 9.5 (A) 07/15/2020    Assessment & Plan    1.  Nonobstructive CAD/dyspnea on exertion: Patient with prior diagnostic catheterization in September 2015 showing nonobstructive diagonal and RCA disease.  He was recently evaluated in late January with progressive exertional dyspnea and occasional exertional angina.  Right and left heart diagnostic catheterization earlier this month showed mild nonobstructive coronary artery disease, normal LV function, and normal left and right heart filling pressures.  Since his catheterization, he has not been experiencing chest pain but continues to have intermittent dyspnea on exertion.  He notes that he has good days and bad days.  We discussed his catheterization results in detail today.  He has pulmonology follow-up next month.  I encouraged regular exercise of 30 minutes daily, as feasible.  Continue aspirin, statin, and  beta-blocker therapy.  2.  Essential hypertension: Stable at 118/62 after increasing Bystolic to 5 mg daily on his last visit.  3.  Hyperlipidemia: LDL of 72 in April 2022.  Continue statin therapy.  Encouraged weight loss.  4.  Type 2 diabetes mellitus: A1c 9.5 in January 2022.  Managed by primary care with recent adjustment to Trulicity.  He otherwise remains on Jardiance, Tresiba, and metformin.  5.  Morbid obesity: Previously lost 100 pounds and remains obese at 297 pounds today.  Deconditioning very likely contributing to dyspnea symptoms.  Encouraged regular exercise and dietary caloric restriction.  6.  Chronic HFpEF: Normal right and left heart filling pressures at the time of his catheterization.  Euvolemic on examination with normal heart rate and blood pressure.  7.  Sleep disordered breathing/obstructive sleep apnea: Patient with prior history of sleep apnea for which he was on CPAP in the past but came off this following weight loss.  His wife notices disordered breathing at night with snoring and wheezing.  We discussed that he will likely require repeat sleep evaluation, as he also experiences daytime somnolence and fatigue.  He will discuss this with his pulmonologist next month.  8.  Disposition: Follow-up in cardiology clinic with Dr. Clifton James in 3 months or sooner if necessary.   Nicolasa Ducking, NP 08/25/2021, 5:08 PM

## 2021-08-26 ENCOUNTER — Encounter: Payer: Self-pay | Admitting: Family Medicine

## 2021-08-27 ENCOUNTER — Telehealth: Payer: Self-pay | Admitting: *Deleted

## 2021-08-27 ENCOUNTER — Telehealth: Payer: Self-pay | Admitting: Family Medicine

## 2021-08-27 DIAGNOSIS — G4733 Obstructive sleep apnea (adult) (pediatric): Secondary | ICD-10-CM

## 2021-08-27 NOTE — Telephone Encounter (Signed)
This patient was asking about getting a sleep study done.  Do you need a specific referral to get that done or can your department see him as is?  Please let me know if I need to put in a referral.  I greatly appreciate your help.

## 2021-08-27 NOTE — Telephone Encounter (Signed)
Appreciate pulmonary input.  Thanks.   Richardson Chiquito, MD  You; Lb[facility] Triage Pool 26 minutes ago (4:20 PM)   Since I have seen him already & mentioned in my note, we can proceed with ordering Home sleep test   [facility], please place request, thanks

## 2021-08-27 NOTE — Telephone Encounter (Signed)
° °  Pre-operative Risk Assessment    Patient Name: [name]  DOB: 02-05-1961 MRN: [id number]      Request for Surgical Clearance    Procedure:   XEN OS  Date of Surgery:  Clearance TBD                                 Surgeon:  DR. Katy Fitch Surgeon's Group or Practice Name:  [facility] Phone number:  [phone] Fax number:  [phone]   Type of Clearance Requested:   - Medical  - Pharmacy:  Hold Aspirin     Type of Anesthesia:  MAC   Additional requests/questions:    Jiles Prows   08/27/2021, 3:33 PM

## 2021-08-30 NOTE — Addendum Note (Signed)
Addended by: Valerie Salts on: 08/30/2021 12:04 PM   Modules accepted: Orders

## 2021-08-30 NOTE — Telephone Encounter (Signed)
° °  Name: [name]  DOB: 18-Feb-1961  MRN: [id number]   Primary Cardiologist: Lauree Chandler, MD  Chart reviewed as part of pre-operative protocol coverage. Patient was contacted 08/30/2021 in reference to pre-operative risk assessment for pending surgery as outlined below.  [name] was last seen on 08/25/21 by Ignacia Bayley NP.  Since that day, [name] has done well.  He had a diagnostic heart catheterization 08/13/2021 for dyspnea on exertion that showed nonobstructive disease and normal filling pressures in the setting of HFpEF. He was then referred to pulmonology and recommended for repeat sleep study. Given that PCI was not performed, he may hold for ASA for 5-7 days prior to eye surgery. Will defer need for clearance from pulmonology to surgeon.   Therefore, based on ACC/AHA guidelines, the patient would be at acceptable risk for the planned procedure without further cardiovascular testing.   The patient was advised that if he develops new symptoms prior to surgery to contact our office to arrange for a follow-up visit, and he verbalized understanding.  I will route this recommendation to the requesting party via Epic fax function and remove from pre-op pool. Please call with questions.  Tami Lin Sena Hoopingarner, PA 08/30/2021, 10:51 AM

## 2021-08-30 NOTE — Telephone Encounter (Signed)
HST order has been placed 

## 2021-09-23 ENCOUNTER — Other Ambulatory Visit: Payer: Self-pay | Admitting: Family Medicine

## 2021-09-29 ENCOUNTER — Ambulatory Visit: Payer: BC Managed Care – PPO | Admitting: Pulmonary Disease

## 2021-10-04 ENCOUNTER — Ambulatory Visit (INDEPENDENT_AMBULATORY_CARE_PROVIDER_SITE_OTHER): Payer: BC Managed Care – PPO | Admitting: Pulmonary Disease

## 2021-10-04 ENCOUNTER — Encounter: Payer: Self-pay | Admitting: Family Medicine

## 2021-10-04 ENCOUNTER — Other Ambulatory Visit: Payer: Self-pay | Admitting: Pulmonary Disease

## 2021-10-04 ENCOUNTER — Encounter: Payer: Self-pay | Admitting: Pulmonary Disease

## 2021-10-04 ENCOUNTER — Ambulatory Visit: Payer: BC Managed Care – PPO | Admitting: Pulmonary Disease

## 2021-10-04 ENCOUNTER — Other Ambulatory Visit: Payer: Self-pay

## 2021-10-04 DIAGNOSIS — G4733 Obstructive sleep apnea (adult) (pediatric): Secondary | ICD-10-CM

## 2021-10-04 DIAGNOSIS — R0609 Other forms of dyspnea: Secondary | ICD-10-CM | POA: Diagnosis not present

## 2021-10-04 NOTE — Progress Notes (Signed)
PFT done today. 

## 2021-10-04 NOTE — Assessment & Plan Note (Signed)
We will reassess with a home sleep test. ?History is obtained from his wife and she reports loud snoring and witnessed apneas and this points to residual OSA in spite of his weight loss ?

## 2021-10-04 NOTE — Progress Notes (Signed)
? ?  Subjective:  ? ? Patient ID: JAICOB DIA, male    DOB: Nov 30, 1960, 61 y.o.   MRN: [id number] ? ?HPI ? ? ?61 yo minimal smoker for FU of shortness of breath. ?PMH - morbid obesity s/p lap band in 2011, weight dropped from 398 to his current weight of 298 pounds.   ?- LHC  2015 -high LVEDP and started on Aldactone ? ?Chest x-ray 07/2020 appeared clear ?D dimer neg ?He continues to complain of shortness of breath on exertion. ?Last office visit, he did not show any desaturation on ambulation.  His lowest weight was around 240 but now he has regained up to 298. ?PFTs obtained today were reviewed which appear normal except for mild extrathoracic restriction ? ?Wife reports loud snoring and he has had some relief with snoring strips ?He had a diagnosis of OSA prior to lap band and then stopped using his machine when he lost weight ? ? ?Significant tests/ events reviewed ? ?PFTs 09/2021 -no airway obstruction with ratio 76, FEV1 83%, FVC 82%, TLC 75% with DLCO 87% ?- PFTs 07/15/14 with low erv only, c/w with effects of obesity  ?  ?LHC 04/02/14 with LVEDP 22 by Dr Katrinka Blazing > rec add aldactone ?  ?CTA 05/2014 negative pulmonary embolism, subtle groundglass densities in the lungs ? ?Review of Systems ?neg for any significant sore throat, dysphagia, itching, sneezing, nasal congestion or excess/ purulent secretions, fever, chills, sweats, unintended wt loss, pleuritic or exertional cp, hempoptysis, orthopnea pnd or change in chronic leg swelling. Also denies presyncope, palpitations, heartburn, abdominal pain, nausea, vomiting, diarrhea or change in bowel or urinary habits, dysuria,hematuria, rash, arthralgias, visual complaints, headache, numbness weakness or ataxia. ? ?   ?Objective:  ? Physical Exam ? ?Gen. [place], obese, in no distress ?ENT - no lesions, no post nasal drip ?Neck: No JVD, no thyromegaly, no carotid bruits ?Lungs: no use of accessory muscles, no dullness to percussion, decreased without rales or rhonchi   ?Cardiovascular: Rhythm regular, heart sounds  normal, no murmurs or gallops, no peripheral edema ?Musculoskeletal: No deformities, no cyanosis or clubbing , no tremors ? ? ? ?   ?Assessment & Plan:  ? ? ?

## 2021-10-04 NOTE — Assessment & Plan Note (Signed)
At this point we have to attribute dyspnea to deconditioning and obesity, cardiac and pulmonary work-up has been negative.  No evidence of pulmonary hypertension, no evidence of ILD .  PFTs only show mild extraparenchymal restriction due to obesity ?

## 2021-10-04 NOTE — Patient Instructions (Addendum)
?  X Home sleep test ? ?Increase exercise gradually - walking, bike etc ?

## 2021-10-05 LAB — PULMONARY FUNCTION TEST
DL/VA % pred: 104 %
DL/VA: 4.33 ml/min/mmHg/L
DLCO cor % pred: 87 %
DLCO cor: 26.99 ml/min/mmHg
DLCO unc % pred: 87 %
DLCO unc: 26.99 ml/min/mmHg
FEF 25-75 Post: 3.13 L/sec
FEF 25-75 Pre: 2.64 L/sec
FEF2575-%Change-Post: 18 %
FEF2575-%Pred-Post: 96 %
FEF2575-%Pred-Pre: 81 %
FEV1-%Change-Post: 3 %
FEV1-%Pred-Post: 86 %
FEV1-%Pred-Pre: 83 %
FEV1-Post: 3.51 L
FEV1-Pre: 3.39 L
FEV1FVC-%Change-Post: 4 %
FEV1FVC-%Pred-Pre: 100 %
FEV6-%Change-Post: 0 %
FEV6-%Pred-Post: 85 %
FEV6-%Pred-Pre: 85 %
FEV6-Post: 4.42 L
FEV6-Pre: 4.41 L
FEV6FVC-%Change-Post: 1 %
FEV6FVC-%Pred-Post: 104 %
FEV6FVC-%Pred-Pre: 103 %
FVC-%Change-Post: 0 %
FVC-%Pred-Post: 81 %
FVC-%Pred-Pre: 82 %
FVC-Post: 4.43 L
FVC-Pre: 4.47 L
Post FEV1/FVC ratio: 79 %
Post FEV6/FVC ratio: 100 %
Pre FEV1/FVC ratio: 76 %
Pre FEV6/FVC Ratio: 99 %
RV % pred: 51 %
RV: 1.28 L
TLC % pred: 75 %
TLC: 5.91 L

## 2021-10-06 ENCOUNTER — Telehealth: Payer: Self-pay | Admitting: Family Medicine

## 2021-10-06 NOTE — Telephone Encounter (Signed)
Please call patient and see what details you can get about his joint pain.  I think he likely needs an office visit to get started in terms of working that up.  We can refer him to rheumatology at that point if needed.  Thanks. ?

## 2021-10-06 NOTE — Telephone Encounter (Signed)
Called patient and scheduled appt for 10/14/21 at 12:00 pm to discuss referral needed.  ?

## 2021-10-14 ENCOUNTER — Ambulatory Visit (INDEPENDENT_AMBULATORY_CARE_PROVIDER_SITE_OTHER): Payer: BC Managed Care – PPO | Admitting: Family Medicine

## 2021-10-14 ENCOUNTER — Encounter: Payer: Self-pay | Admitting: Family Medicine

## 2021-10-14 VITALS — BP 140/70 | HR 61 | Temp 97.7°F | Ht 74.0 in | Wt 294.0 lb

## 2021-10-14 DIAGNOSIS — M2550 Pain in unspecified joint: Secondary | ICD-10-CM

## 2021-10-14 LAB — COMPREHENSIVE METABOLIC PANEL
ALT: 12 U/L (ref 0–53)
AST: 14 U/L (ref 0–37)
Albumin: 4.3 g/dL (ref 3.5–5.2)
Alkaline Phosphatase: 107 U/L (ref 39–117)
BUN: 18 mg/dL (ref 6–23)
CO2: 30 mEq/L (ref 19–32)
Calcium: 9.4 mg/dL (ref 8.4–10.5)
Chloride: 103 mEq/L (ref 96–112)
Creatinine, Ser: 0.89 mg/dL (ref 0.40–1.50)
GFR: 91.85 mL/min (ref >60.00)
Glucose, Bld: 148 mg/dL — ABNORMAL HIGH (ref 70–99)
Potassium: 4.3 mEq/L (ref 3.5–5.1)
Sodium: 141 mEq/L (ref 135–145)
Total Bilirubin: 0.6 mg/dL (ref 0.2–1.2)
Total Protein: 7 g/dL (ref 6.0–8.3)

## 2021-10-14 LAB — CBC WITH DIFFERENTIAL/PLATELET
Basophils Absolute: 0.1 10*3/uL (ref 0.0–0.1)
Basophils Relative: 1.4 % (ref 0.0–3.0)
Eosinophils Absolute: 0.6 10*3/uL (ref 0.0–0.7)
Eosinophils Relative: 6.5 % — ABNORMAL HIGH (ref 0.0–5.0)
HCT: 43.8 % (ref 39.0–52.0)
Hemoglobin: 14.4 g/dL (ref 13.0–17.0)
Lymphocytes Relative: 41.9 % (ref 12.0–46.0)
Lymphs Abs: 3.6 10*3/uL (ref 0.7–4.0)
MCHC: 33 g/dL (ref 30.0–36.0)
MCV: 89.4 fl (ref 78.0–100.0)
Monocytes Absolute: 0.4 10*3/uL (ref 0.1–1.0)
Monocytes Relative: 4.6 % (ref 3.0–12.0)
Neutro Abs: 3.9 10*3/uL (ref 1.4–7.7)
Neutrophils Relative %: 45.6 % (ref 43.0–77.0)
Platelets: 336 10*3/uL (ref 150.0–400.0)
RBC: 4.9 Mil/uL (ref 4.22–5.81)
RDW: 14 % (ref 11.5–15.5)
WBC: 8.5 10*3/uL (ref 4.0–10.5)

## 2021-10-14 LAB — SEDIMENTATION RATE: Sed Rate: 16 mm/hr (ref 0–20)

## 2021-10-14 MED ORDER — INSULIN DEGLUDEC 200 UNIT/ML ~~LOC~~ SOPN: 32.0000 [IU] | PEN_INJECTOR | Freq: Every evening | SUBCUTANEOUS | Status: DC

## 2021-10-14 NOTE — Patient Instructions (Addendum)
Go to the lab on the way out.   If you have mychart we'll likely use that to update you.    ?Take care.  Glad to see you. ?Stop the lipitor for 10 days and see if the aches get better.  Either way, let me know.   ?Ask the eye clinic to send me a copy of your next note.   ?

## 2021-10-14 NOTE — Progress Notes (Signed)
Joint pain.  Diffuse joint pain.  L hip, B knee, B ankles.  R shoulder pain after prev RTC injury/repair.  He has diffuse hand and knuckle pain/stiffness.  Pain going up and down steps.  H/o RMSF years ago, treated with doxy at that point.  Going to chiropractor helped with some of his joint pain.   ? ?He has sleep study pending per pulmonary.   ? ?He has eye clinic f/u pending.  See avs.   ? ?He had a tick bite about 1 week ago, it wasn't engorged.  No psoriasis hx.  No rash.  ? ?Meds, vitals, and allergies reviewed.  ? ?ROS: Per HPI unless specifically indicated in ROS section  ? ?Nad ?Ncat ?Neck supple, no LA ?Rrr ?Ctab ?Abd soft, not ttp.   ?L knee diffusely sore at baseline but not ttp. No crepitus on ROM.   ?Some puffiness on the PIP joints B.   ? ?30 minutes were devoted to patient care in this encounter (this includes time spent reviewing the patient's file/history, interviewing and examining the patient, counseling/reviewing plan with patient).  ? ?

## 2021-10-15 LAB — ANA W/REFLEX: Anti Nuclear Antibody (ANA): NEGATIVE

## 2021-10-17 DIAGNOSIS — M2550 Pain in unspecified joint: Secondary | ICD-10-CM | POA: Insufficient documentation

## 2021-10-17 NOTE — Assessment & Plan Note (Signed)
Multiple issues to consider.  Check basic labs today.  X-ray is down at our clinic today.  He agreed to return when x-ray was operational.  I apologized to him about that and he was really gracious about that.   ? ?He could have statin induced joint pain, he could have osteoarthritis, he could have inflammatory arthritis.  We talked about the differential broadly and we agreed it was reasonable for him to hold his statin for about 10 days and let me know how he feels.  See notes on labs. ?

## 2021-10-18 LAB — RHEUMATOID FACTOR: Rheumatoid fact SerPl-aCnc: <14 IU/mL (ref <14)

## 2021-10-18 LAB — B. BURGDORFI ANTIBODIES: B burgdorferi Ab IgG+IgM: <0.9 index

## 2021-10-18 LAB — ROCKY MTN SPOTTED FVR ABS PNL(IGG+IGM)
RMSF IgG: NOT DETECTED
RMSF IgM: NOT DETECTED

## 2021-10-20 ENCOUNTER — Ambulatory Visit (INDEPENDENT_AMBULATORY_CARE_PROVIDER_SITE_OTHER)
Admission: RE | Admit: 2021-10-20 | Discharge: 2021-10-20 | Disposition: A | Discharge status: Home / Self Care | Payer: BC Managed Care – PPO | Source: Ambulatory Visit | Attending: Family Medicine | Admitting: Family Medicine

## 2021-10-20 DIAGNOSIS — M2550 Pain in unspecified joint: Secondary | ICD-10-CM

## 2021-10-26 ENCOUNTER — Other Ambulatory Visit: Payer: Self-pay | Admitting: Family Medicine

## 2021-10-26 ENCOUNTER — Encounter: Payer: Self-pay | Admitting: Family Medicine

## 2021-10-26 MED ORDER — ATORVASTATIN CALCIUM 80 MG PO TABS: 40.0000 mg | ORAL_TABLET | Freq: Every day | ORAL | Status: DC

## 2021-11-03 ENCOUNTER — Other Ambulatory Visit: Payer: Self-pay | Admitting: Family Medicine

## 2021-11-03 NOTE — Telephone Encounter (Signed)
Refill request for GABAPENTIN 100 MG CAP ? ?LOV - 10/14/21 ?Next OV - not scheduled ?Last refill - 05/16/21 #270/1 ? ?

## 2021-11-07 ENCOUNTER — Encounter: Payer: Self-pay | Admitting: Family Medicine

## 2021-11-10 ENCOUNTER — Other Ambulatory Visit: Payer: Self-pay | Admitting: Family Medicine

## 2021-11-10 MED ORDER — PRAVASTATIN SODIUM 20 MG PO TABS: 20.0000 mg | ORAL_TABLET | Freq: Every day | ORAL | 3 refills | Status: DC

## 2022-01-31 ENCOUNTER — Encounter: Payer: Self-pay | Admitting: Pulmonary Disease

## 2022-02-03 ENCOUNTER — Ambulatory Visit: Payer: BC Managed Care – PPO

## 2022-02-03 DIAGNOSIS — G4733 Obstructive sleep apnea (adult) (pediatric): Secondary | ICD-10-CM | POA: Diagnosis not present

## 2022-02-04 ENCOUNTER — Telehealth: Payer: Self-pay | Admitting: Pulmonary Disease

## 2022-02-04 DIAGNOSIS — G4733 Obstructive sleep apnea (adult) (pediatric): Secondary | ICD-10-CM

## 2022-02-04 NOTE — Telephone Encounter (Signed)
HST showed mild  OSA with AHI 12/ hr Events were mainly noted in the supine position   He can continue to use CPAP until he loses more weight or can simply avoid sleeping on his back   OV with APP in 6 wks to reassess

## 2022-02-04 NOTE — Telephone Encounter (Signed)
Called and spoke to patient about results and recommendations. He voiced understanding. Scheduled patient for appt. In 6 weeks with [facility] NP. Nothing further needed at this time.

## 2022-02-14 ENCOUNTER — Ambulatory Visit (INDEPENDENT_AMBULATORY_CARE_PROVIDER_SITE_OTHER): Payer: BC Managed Care – PPO | Admitting: Cardiovascular Disease

## 2022-02-14 ENCOUNTER — Encounter: Payer: Self-pay | Admitting: Cardiovascular Disease

## 2022-02-14 VITALS — BP 130/70 | HR 77 | Ht 74.0 in | Wt 291.4 lb

## 2022-02-14 DIAGNOSIS — I5032 Chronic diastolic (congestive) heart failure: Secondary | ICD-10-CM

## 2022-02-14 DIAGNOSIS — I2510 Atherosclerotic heart disease of native coronary artery without angina pectoris: Secondary | ICD-10-CM

## 2022-02-14 DIAGNOSIS — E785 Hyperlipidemia, unspecified: Secondary | ICD-10-CM | POA: Diagnosis not present

## 2022-02-14 DIAGNOSIS — I10 Essential (primary) hypertension: Secondary | ICD-10-CM

## 2022-02-14 LAB — LIPID PANEL
Chol/HDL Ratio: 4.5 ratio (ref 0.0–5.0)
Cholesterol, Total: 197 mg/dL (ref 100–199)
HDL: 44 mg/dL (ref >39)
LDL Chol Calc (NIH): 134 mg/dL — ABNORMAL HIGH (ref 0–99)
Triglycerides: 106 mg/dL (ref 0–149)
VLDL Cholesterol Cal: 19 mg/dL (ref 5–40)

## 2022-02-14 MED ORDER — EZETIMIBE 10 MG PO TABS: 10.0000 mg | ORAL_TABLET | Freq: Every day | ORAL | 3 refills | Status: AC

## 2022-02-14 NOTE — Progress Notes (Signed)
Chief Complaint  Patient presents with   Follow-up    CAD   History of Present Illness: 61 yo male with a history of HTN, HLD, morbid obesity status post lap band surgery, diabetes mellitus, sleep apnea,  non-obstructive CAD and chronic diastolic CHF who is here today for follow up. I met him in June 2016. He was seen in the emergency room 03/01/14 with chest pain and dyspnea. Cardiac markers were normal. Stress myoview demonstrated apical defect (possible scar).  Cardiac cath 2015 demonstrated mild coronary plaque but no obstructive disease.  LVEDP was elevated and continued management of diastolic CHF was recommended.  He was seen in our office August 2018 by Lyda Jester, PA with c/o chest pain and dyspnea. Echo 03/15/17 with normal LV systolic function, grade 1 diastolic dysfunction and no valve disease. Nuclear stress test September 2018 was low risk with no large areas of ischemia. He had recurrent chest pain and dyspnea in January 2023. Cardiac cath February 2023 with mild CAD. LV filling pressure was normal.   He is here today for follow up. The patient denies any chest pain, dyspnea, palpitations, lower extremity edema, orthopnea, PND, dizziness, near syncope or syncope. He stopped his Lipitor due to joint aches and then tried Crestor. He did not tolerate either of these. He has had no joint pain since stopping the statins.   He is a Soil scientist.   Primary Care Physician: Tonia Ghent, MD   Past Medical History:  Diagnosis Date   Allergic rhinitis, cause unspecified    Chronic diastolic CHF (congestive heart failure) ([place])    a. 03/2017 Echo: EF 55%, Gr1DD, nl RV fxn.   Degeneration of intervertebral disc, site unspecified    Diabetes mellitus    Hyperlipidemia    Hypertension    Impotence of organic origin    Nonobstructive CAD    a. 03/2014 Cath: LM nl, LAD nl, D1 30ost/p, LCX nl, RCA 30d, EF 60%; b. 03/2017 MV: EF 56%, small, moderate basal inf/mid inf  defect->prob inf thinning vs mild ischemia-->low-risk; c. 08/2021 Cath: LM nl, LAD 20ost/p, LCX large, 77m RCA large, 30d, EF 50-55%. Nl filling pressures-->Med rx.   Obesity, unspecified    Obstructive sleep apnea (adult) (pediatric)    Other chronic allergic conjunctivitis    Personal history of colonic polyps     Past Surgical History:  Procedure Laterality Date   [facility]  Sync[facility] event with nausea, diaphoresis, hypertension.  D-dimer elevated at 2.54 with negative CT chest. and VQ scan.  Cardiac enzymes negative x 3.  Labs normal.  Cardiology consult---cardiolite abn; cath, 2D-echo, holter monitor unrevealing.  SE H&V.   Allergy Consult  07/11/2010   SOB, itching, facial tingling, hypotension.  Allergy testing: no food allergies; +reaction to tree pollen, grass pollen, dust mites, mold.  Avoid diclofenac and similar products.  WOrvil Feil   APPENDECTOMY     arthroscopy of knee  1980   BACK SURGERY     x3   CATARACT EXTRACTION Left    COLONOSCOPY  07/11/2009   single polyp; repeat in 5 years.  [facility] Plenty   GI consult  07/11/2010   CT chest in ED (gastric thickening).  HAmedeo Plenty  No EGD warranted due to lack of symptoms.   LAPAROSCOPIC GASTRIC BANDING  09/15/09   LEFT HEART CATHETERIZATION WITH CORONARY ANGIOGRAM N/A 04/02/2014   Procedure: LEFT HEART CATHETERIZATION WITH CORONARY ANGIOGRAM;  Surgeon: HSinclair Grooms MD;  Location:  [place] CATH [facility];  Service: Cardiovascular;  Laterality: N/A;   QUADRICEPS REPAIR     left   QUADRICEPS TENDON REPAIR     left   RETINAL DETACHMENT SURGERY     RIGHT/LEFT HEART CATH AND CORONARY ANGIOGRAPHY N/A 08/13/2021   Procedure: RIGHT/LEFT HEART CATH AND CORONARY ANGIOGRAPHY;  Surgeon: Burnell Blanks, MD;  Location: [place] [facility];  Service: Cardiovascular;  Laterality: N/A;   ROTATOR CUFF REPAIR     right   Sleep study  07/11/2010   repeat sleep study due to weight loss.  Severe OSA; CPAP titration to 8 cm.     SPINE SURGERY      TONSILLECTOMY AND ADENOIDECTOMY  1960    Current Outpatient Medications  Medication Sig Dispense Refill   albuterol (VENTOLIN HFA) 108 (90 Base) MCG/ACT inhaler Inhale 1-2 puffs into the lungs every 6 (six) hours as needed for wheezing or shortness of breath. 1 each 1   aspirin EC 81 MG tablet Take 81 mg by mouth daily.     Continuous Blood Gluc Receiver ([place]) DEVI by Does not apply route.     Continuous Blood Gluc Sensor (DEXCOM G7 SENSOR) MISC by Does not apply route.     cyclobenzaprine (FLEXERIL) 5 MG tablet TAKE 1 TABLET BY MOUTH EVERY NIGHT AT BEDTIME 30 tablet 1   Dulaglutide (TRULICITY) 4.5 EN/2.7PO SOPN Inject 4.5 mg into the skin once a week.     FLUoxetine (PROZAC) 20 MG tablet TAKE 1 TABLET BY MOUTH ONCE A DAY 90 tablet 1   gabapentin (NEURONTIN) 100 MG capsule TAKE 1 TO 3 CAPSULES BY MOUTH 3 TIMES DAILY AS NEEDED 270 capsule 1   insulin aspart (NOVOLOG FLEXPEN) 100 UNIT/ML FlexPen Per sliding scale     insulin degludec (TRESIBA) 200 UNIT/ML FlexTouch Pen Inject 32 Units into the skin every evening.     JARDIANCE 25 MG TABS tablet TAKE 1 TABLET BY MOUTH ONCE A DAY 30 tablet 2   metFORMIN (GLUCOPHAGE) 1000 MG tablet TAKE 1 TABLET (1,000 MG TOTAL) BY MOUTH 2 (TWO) TIMES DAILY WITH A MEAL. 180 tablet 2   Multiple Vitamin (MULTIVITAMIN) capsule Take 1 capsule by mouth daily.     nebivolol (BYSTOLIC) 5 MG tablet Take 1 tablet (5 mg total) by mouth daily. 90 tablet 2   nitroGLYCERIN (NITROSTAT) 0.4 MG SL tablet Place 1 tablet (0.4 mg total) under the tongue every 5 (five) minutes as needed for chest pain. 25 tablet 3   No current facility-administered medications for this visit.    Allergies  Allergen Reactions   Diclofenac Sodium Shortness Of Breath, Itching and Other (See Comments)    Hypotension    Voltaren [Diclofenac Sodium] Anaphylaxis   Lipitor [Atorvastatin]     Myalgias at 38m dose   Metoprolol     Lightheaded/dizzy/SOB.     Ramipril      Lightheaded/dizzy/SOB.      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      Employment: employed.      Tobacco: never      Alcohol: beer twice per month on weekends.      Drugs: none      Exercise: walking three days per week.      Seatbelt: 100% of time      Guns: loaded and secured/locked.      Sunscreen: yes.   Social Determinants of Health   Financial Resource Strain: Not on file  Food Insecurity: Not on file  Transportation Needs: Not on file  Physical Activity: Not on file  Stress: Not on file  Social Connections: Not on file  Intimate Partner Violence: Not on file    Family History  Problem Relation Age of Onset   Cancer Father        skin   Heart disease Father 82       AMI first age 45; stents.   Diabetes Father    Hypertension Father    Hyperlipidemia Father    Peripheral Artery Disease Father    Parkinson's disease Father    Cancer Paternal Grandfather        skin   Diabetes Mother    Arthritis Mother        total hip replacement   Kidney failure Mother    Depression Sister    Diabetes Sister    Cancer Maternal Uncle        lymphoma    Prostate cancer Maternal Uncle    Colon cancer Neg Hx     Review of Systems:  As stated in the HPI and otherwise negative.   BP 130/70   Pulse 77   Ht 6' 2"  (1.88 m)   Wt 291 lb 6.4 oz (132.2 kg)   SpO2 99%   BMI 37.41 kg/m   Physical Examination:  General: Well developed, well nourished, NAD  HEENT: OP clear, mucus membranes moist  SKIN: warm, dry. No rashes. Neuro: No focal deficits  Musculoskeletal: Muscle strength 5/5 all ext  Psychiatric: Mood and affect normal  Neck: No JVD, no carotid bruits, no thyromegaly, no lymphadenopathy.  Lungs:Clear bilaterally, no wheezes, rhonci, crackles Cardiovascular: Regular rate and rhythm. No murmurs, gallops or rubs. Abdomen:Soft. Bowel sounds present. Non-tender.  Extremities: No lower extremity edema. Pulses are 2 + in the bilateral DP/PT.  Echo 03/15/17: - Left ventricle: The cavity size was normal. Wall thickness was   normal. The estimated ejection fraction was 55%. Wall motion was   normal; there were no regional wall motion abnormalities. Doppler   parameters are consistent with abnormal left ventricular   relaxation (grade 1 diastolic dysfunction). - Aortic valve: There was no stenosis. - Mitral valve: There was no significant regurgitation. - Right ventricle: The cavity size was normal. Systolic function   was normal. - Pulmonary arteries: No complete TR doppler jet so unable to   estimate PA systolic pressure. - Inferior vena cava: The vessel was normal in size. The   respirophasic diameter changes were  in the normal range (= 50%),   consistent with normal central venous pressure. Impressions:   - Normal LV size with EF 55%. Normal RV size and systolic function.   No significant valvular abnormalities.  EKG:  EKG is not ordered today. The ekg ordered today demonstrates  Recent Labs: 07/08/2021: Pro B Natriuretic peptide (BNP) 10.0 10/14/2021: ALT 12; BUN 18; Creatinine, Ser 0.89; Hemoglobin 14.4; Platelets 336.0;  Potassium 4.3; Sodium 141   Lipid Panel    Component Value Date/Time   CHOL 129 11/04/2020 0814   TRIG 73 11/04/2020 0814   TRIG 88 07/24/2006 1009   HDL 42 11/04/2020 0814   CHOLHDL 3.1 11/04/2020 0814   CHOLHDL 3 08/05/2019 0823   VLDL 20.8 08/05/2019 0823   LDLCALC 72 11/04/2020 0814     Wt Readings from Last 3 Encounters:  02/14/22 291 lb 6.4 oz (132.2 kg)  10/14/21 294 lb (133.4 kg)  10/04/21 292 lb 0.6 oz (132.5 kg)     Assessment and Plan:   1. Chronic diastolic CHF: Weight is stable today. No volume overload on exam.   2.  Coronary artery disease without angina: He has mild CAD by cath in February 2023. He has no chest pain suggestive of angina. Will continue ASA and beta blocker.      3.  Essential hypertension: BP is well controlled. No changes today  4.  Hyperlipidemia: Managed in primary care. LDL 72 in April 2022. Will check lipids today. Will add Zetia 10 mg daily. If we cannot keep his LDL at goal, will need to refer to lipid clinic to discuss Repatha since he has failed 2 statins (Lipitor and Crestor).    Labs/ tests ordered today include:   No orders of the defined types were placed in this encounter.   Disposition:   F/U with me in 12 months  Signed, Lauree Chandler, MD 02/14/2022 10:16 AM    [address], [place], [place]  [address] Phone: [phone]; Fax: [phone]

## 2022-02-14 NOTE — Patient Instructions (Signed)
Medication Instructions:  Your physician has recommended you make the following change in your medication:  1.) start ezetimibe (Zetia) 10 mg - take one tablet daily  *If you need a refill on your cardiac medications before your next appointment, please call your pharmacy*   Lab Work: Today: Lipid panel If you have labs (blood work) drawn today and your tests are completely normal, you will receive your results only by: MyChart Message (if you have MyChart) OR A paper copy in the mail If you have any lab test that is abnormal or we need to change your treatment, we will call you to review the results.   Testing/Procedures: none   Follow-Up: At [facility], you and your health needs are our priority.  As part of our continuing mission to provide you with exceptional heart care, we have created designated Provider Care Teams.  These Care Teams include your primary Cardiologist (physician) and Advanced Practice Providers (APPs -  Physician Assistants and Nurse Practitioners) who all work together to provide you with the care you need, when you need it.   Your next appointment:   12 month(s)  The format for your next appointment:   In Person  Provider:   Verne Carrow, MD     Important Information About Sugar

## 2022-02-17 ENCOUNTER — Other Ambulatory Visit: Payer: Self-pay | Admitting: *Deleted

## 2022-02-17 DIAGNOSIS — I2510 Atherosclerotic heart disease of native coronary artery without angina pectoris: Secondary | ICD-10-CM

## 2022-02-17 DIAGNOSIS — E785 Hyperlipidemia, unspecified: Secondary | ICD-10-CM

## 2022-02-17 NOTE — Progress Notes (Signed)
Lipid clinic referral placed.  

## 2022-02-21 ENCOUNTER — Emergency Department
Admission: EM | Admit: 2022-02-21 | Discharge: 2022-02-21 | Discharge status: Against Medical Advice | Payer: BC Managed Care – PPO | Attending: Emergency Medicine | Admitting: Emergency Medicine

## 2022-02-21 ENCOUNTER — Emergency Department (HOSPITAL_BASED_OUTPATIENT_CLINIC_OR_DEPARTMENT_OTHER)
Admission: EM | Admit: 2022-02-21 | Discharge: 2022-02-21 | Disposition: A | Discharge status: Home / Self Care | Payer: BC Managed Care – PPO | Attending: Emergency Medicine | Admitting: Emergency Medicine

## 2022-02-21 ENCOUNTER — Encounter (HOSPITAL_BASED_OUTPATIENT_CLINIC_OR_DEPARTMENT_OTHER): Payer: Self-pay | Admitting: Emergency Medicine

## 2022-02-21 ENCOUNTER — Telehealth: Payer: Self-pay | Admitting: Family Medicine

## 2022-02-21 ENCOUNTER — Emergency Department (HOSPITAL_BASED_OUTPATIENT_CLINIC_OR_DEPARTMENT_OTHER): Payer: BC Managed Care – PPO | Admitting: Radiology

## 2022-02-21 ENCOUNTER — Emergency Department: Payer: BC Managed Care – PPO

## 2022-02-21 ENCOUNTER — Ambulatory Visit
Admission: EM | Admit: 2022-02-21 | Discharge: 2022-02-21 | Discharge status: Against Medical Advice | Payer: BC Managed Care – PPO

## 2022-02-21 ENCOUNTER — Other Ambulatory Visit: Payer: Self-pay

## 2022-02-21 DIAGNOSIS — R0981 Nasal congestion: Secondary | ICD-10-CM | POA: Diagnosis not present

## 2022-02-21 DIAGNOSIS — I110 Hypertensive heart disease with heart failure: Secondary | ICD-10-CM | POA: Diagnosis not present

## 2022-02-21 DIAGNOSIS — M542 Cervicalgia: Secondary | ICD-10-CM | POA: Insufficient documentation

## 2022-02-21 DIAGNOSIS — Z7984 Long term (current) use of oral hypoglycemic drugs: Secondary | ICD-10-CM | POA: Insufficient documentation

## 2022-02-21 DIAGNOSIS — I2510 Atherosclerotic heart disease of native coronary artery without angina pectoris: Secondary | ICD-10-CM | POA: Insufficient documentation

## 2022-02-21 DIAGNOSIS — Z794 Long term (current) use of insulin: Secondary | ICD-10-CM | POA: Diagnosis not present

## 2022-02-21 DIAGNOSIS — R0602 Shortness of breath: Secondary | ICD-10-CM | POA: Diagnosis present

## 2022-02-21 DIAGNOSIS — Z5321 Procedure and treatment not carried out due to patient leaving prior to being seen by health care provider: Secondary | ICD-10-CM | POA: Insufficient documentation

## 2022-02-21 DIAGNOSIS — I509 Heart failure, unspecified: Secondary | ICD-10-CM | POA: Diagnosis not present

## 2022-02-21 DIAGNOSIS — Z7982 Long term (current) use of aspirin: Secondary | ICD-10-CM | POA: Diagnosis not present

## 2022-02-21 DIAGNOSIS — R112 Nausea with vomiting, unspecified: Secondary | ICD-10-CM | POA: Insufficient documentation

## 2022-02-21 DIAGNOSIS — Z20822 Contact with and (suspected) exposure to covid-19: Secondary | ICD-10-CM | POA: Diagnosis not present

## 2022-02-21 DIAGNOSIS — E1065 Type 1 diabetes mellitus with hyperglycemia: Secondary | ICD-10-CM | POA: Diagnosis not present

## 2022-02-21 DIAGNOSIS — R110 Nausea: Secondary | ICD-10-CM | POA: Diagnosis not present

## 2022-02-21 DIAGNOSIS — R519 Headache, unspecified: Secondary | ICD-10-CM | POA: Insufficient documentation

## 2022-02-21 LAB — BASIC METABOLIC PANEL
Anion gap: 6 (ref 5–15)
Anion gap: 8 (ref 5–15)
BUN: 17 mg/dL (ref 8–23)
BUN: 18 mg/dL (ref 8–23)
CO2: 26 mmol/L (ref 22–32)
CO2: 27 mmol/L (ref 22–32)
Calcium: 9.2 mg/dL (ref 8.9–10.3)
Calcium: 9.5 mg/dL (ref 8.9–10.3)
Chloride: 106 mmol/L (ref 98–111)
Chloride: 105 mmol/L (ref 98–111)
Creatinine, Ser: 0.87 mg/dL (ref 0.61–1.24)
Creatinine, Ser: 0.9 mg/dL (ref 0.61–1.24)
GFR, Estimated: >60 mL/min (ref >60)
GFR, Estimated: >60 mL/min (ref >60)
Glucose, Bld: 164 mg/dL — ABNORMAL HIGH (ref 70–99)
Glucose, Bld: 140 mg/dL — ABNORMAL HIGH (ref 70–99)
Potassium: 4.3 mmol/L (ref 3.5–5.1)
Potassium: 4.4 mmol/L (ref 3.5–5.1)
Sodium: 138 mmol/L (ref 135–145)
Sodium: 140 mmol/L (ref 135–145)

## 2022-02-21 LAB — CBC
HCT: 44.9 % (ref 39.0–52.0)
HCT: 44.2 % (ref 39.0–52.0)
Hemoglobin: 15.1 g/dL (ref 13.0–17.0)
Hemoglobin: 15 g/dL (ref 13.0–17.0)
MCH: 29.4 pg (ref 26.0–34.0)
MCH: 30.1 pg (ref 26.0–34.0)
MCHC: 33.6 g/dL (ref 30.0–36.0)
MCHC: 33.9 g/dL (ref 30.0–36.0)
MCV: 87.4 fL (ref 80.0–100.0)
MCV: 88.6 fL (ref 80.0–100.0)
Platelets: 324 10*3/uL (ref 150–400)
Platelets: 291 10*3/uL (ref 150–400)
RBC: 5.14 MIL/uL (ref 4.22–5.81)
RBC: 4.99 MIL/uL (ref 4.22–5.81)
RDW: 12.3 % (ref 11.5–15.5)
RDW: 12.7 % (ref 11.5–15.5)
WBC: 10.8 10*3/uL — ABNORMAL HIGH (ref 4.0–10.5)
WBC: 10.6 10*3/uL — ABNORMAL HIGH (ref 4.0–10.5)
nRBC: 0 % (ref 0.0–0.2)
nRBC: 0 % (ref 0.0–0.2)

## 2022-02-21 LAB — TROPONIN I (HIGH SENSITIVITY): Troponin I (High Sensitivity): 4 ng/L (ref <18)

## 2022-02-21 LAB — BRAIN NATRIURETIC PEPTIDE: B Natriuretic Peptide: 32.5 pg/mL (ref 0.0–100.0)

## 2022-02-21 LAB — SARS CORONAVIRUS 2 BY RT PCR: SARS Coronavirus 2 by RT PCR: NEGATIVE

## 2022-02-21 MED ORDER — ACETAMINOPHEN 500 MG PO TABS
1000.0000 mg | ORAL_TABLET | Freq: Once | ORAL | Status: AC
  Administered 2022-02-21: 1000 mg via ORAL
  Filled 2022-02-21: qty 2

## 2022-02-21 MED ORDER — ONDANSETRON HCL 4 MG PO TABS: 4.0000 mg | ORAL_TABLET | Freq: Four times a day (QID) | ORAL | 0 refills | Status: DC

## 2022-02-21 MED ORDER — METHOCARBAMOL 500 MG PO TABS
500.0000 mg | ORAL_TABLET | Freq: Once | ORAL | Status: AC
  Administered 2022-02-21: 500 mg via ORAL
  Filled 2022-02-21: qty 1

## 2022-02-21 NOTE — Telephone Encounter (Signed)
Reviewed patient chart looks like he is at urgent care now for evaluation. No further action needed at this time.   [facility] Day - Client TELEPHONE ADVICE RECORD AccessNurse Patient Name: [name] NS Gender: Male DOB: [birth year] Age: 61 Y 10 M 9 D Return Phone Number: [phone] (Primary), [phone] (Secondary) Address: City/ State/ Zip: [name] [address] Client [name] Day - Client Client Site [facility] - Day Contact Type Call Who Is Calling Patient / Member / Family / Caregiver Call Type Triage / Clinical Caller Name [name] Relationship To Patient Spouse Return Phone Number [phone] (Primary) Chief Complaint BREATHING - shortness of breath or sounds breathless Reason for Call Symptomatic / Request for Health Information Initial Comment Caller states that her husband is having neck pains and some headaches, trouble breathing when he is trying to do things, and also some nausea for the past few days. Caller advised that her husband had his heart checked about a week and a half ago and the results came out okay . Caller states that her husband may have Covid. Caller states Dr. Para March . Translation No Nurse Assessment Nurse: Charna Elizabeth, RN, Cathy Date/Time (Eastern Time): 02/21/2022 9:47:06 AM Confirm and document reason for call. If symptomatic, describe symptoms. ---Caller states that her husband may have been exposed to COVID via family member. He developed nausea, headache (no headache at this time), congestion, neck pain (no neck pain at this time, no severe breathing difficulty or blueness around his lips. No chest pain. He developed shortness of breath with walking about 3 days ago. Spo2 97. No wheezing. Alert and responsive. Does the patient have any new or worsening symptoms? ---Yes Will a triage be completed? ---Yes Related visit to physician within the last 2  weeks? ---No Does the PT have any chronic conditions? (i.e. diabetes, asthma, this includes High risk factors for pregnancy, etc.) ---Yes List chronic conditions. ---Diabetes, "Narrowing in heart" Is this a behavioral health or substance abuse call? ---No PLEASE NOTE: All timestamps contained within this report are represented as [name] Standard Time. CONFIDENTIALTY NOTICE: This fax transmission is intended only for the addressee. It contains information that is legally privileged, confidential or otherwise protected from use or disclosure. If you are not the intended recipient, you are strictly prohibited from reviewing, disclosing, copying using or disseminating any of this information or taking any action in reliance on or regarding this information. If you have received this fax in error, please notify us immediately by telephone so that we can arrange for its return to [name]. Phone: [phone], Toll-Free: [phone], Fax: [phone] Page: 2 of 2 Call Id: [id number] Guidelines Guideline Title Affirmed Question Affirmed Notes Nurse Date/Time Lamount Cohen Time) COVID-19 - Diagnosed or Suspected MILD difficulty breathing (e.g., minimal/no SOB at rest, SOB with walking, pulse <100) [place], RN, [facility] 02/21/2022 9:52:22 AM Disp. Time Lamount Cohen Time) Disposition Final User 02/21/2022 9:44:54 AM Send to Urgent Queue Elsie Ra 02/21/2022 9:54:03 AM See HCP within 4 Hours (or PCP triage) Yes Charna Elizabeth, RN, Lynden Ang Final Disposition 02/21/2022 9:54:03 AM See HCP within 4 Hours (or PCP triage) Yes Charna Elizabeth, RN, Frann Rider Disagree/Comply Comply Caller Understands Yes PreDisposition Go to Urgent Care/Walk-In Clinic Care Advice Given Per Guideline SEE HCP (OR PCP TRIAGE) WITHIN 4 HOURS: * IF OFFICE WILL BE OPEN: You need to be seen within the next 3 or 4 hours. Call your doctor (or NP/PA) now or as soon as the office opens. *  Fever, Chills, and Sweats: For fever over 101 F (38.3 C),  take acetaminophen every 4 to 6 hours (Adults 650 mg) OR ibuprofen every 6 to 8 hours (Adults 400 mg). Before taking any medicine, read all the instructions on the package. Do not take aspirin unless your doctor has prescribed it for you. Chills can sometimes come before a fever. You may feel cold in your hands and feet. You may have shivering. You may also feel sweaty as your body temperature goes down. GENERAL CARE ADVICE FOR COVID-19 SYMPTOMS: CALL BACK IF: * You become worse CARE ADVICE given per COVID-19 - DIAGNOSED OR SUSPECTED (Adult) guideline. Comments User: [name], RN Date/Time Lamount Cohen Time): 02/21/2022 9:58:26 AM Unable to reach a Scheduler. Connected to Scheduling Line to check on appointment within 4 hours. Referrals REFERRED TO PCP OFFICE Warm transfer to backline

## 2022-02-21 NOTE — ED Provider Notes (Signed)
[facility] EMERGENCY DEPT Provider Note   CSN: [id number] Arrival date & time: 02/21/22  1350     History  Chief Complaint  Patient presents with   Nausea   Headache    [name] is a 61 y.o. male with past medical history significant for insulin-dependent diabetes, hypertension, hyperlipidemia, obesity, coronary artery disease, congestive heart failure who presents with concern for headache, nausea, shortness of breath with exertion that started around 4 days ago.  Patient reports that he has had a stuffy nose, thinks that some of his symptoms may be secondary to allergies.  He did have a recent cardiac catheterization which did not result in any stenting.  He has not noticed any leg swelling, productive cough, fever, chills, sore throat.  He denies any chest pain with exertion.  Family member reports that symptoms were significantly worse yesterday with a fair amount of increased work of breathing.  Patient reports that he has been seen and evaluated multiple times by cardiologist and pulmonologist, does not have a diagnosis of asthma or COPD but has had ongoing difficulties with breathing.  At time my evaluation he endorses headache, neck pain, congestion, he denies nausea at time of my evaluation.  Patient initially presented to urgent care but due to his shortness of breath with exertion, and history of cardiac disease they recommended that he go to the emergency department.  He is in no acute distress at time my evaluation.   Headache Associated symptoms: nausea        Home Medications Prior to Admission medications   Medication Sig Start Date End Date Taking? Authorizing Provider  ondansetron (ZOFRAN) 4 MG tablet Take 1 tablet (4 mg total) by mouth every 6 (six) hours. 02/21/22  Yes Lane Eland H, PA-C  albuterol (VENTOLIN HFA) 108 (90 Base) MCG/ACT inhaler Inhale 1-2 puffs into the lungs every 6 (six) hours as needed for wheezing or shortness of breath.  07/19/21   Joaquim Nam, MD  aspirin EC 81 MG tablet Take 81 mg by mouth daily.    [provider]  Continuous Blood Gluc Receiver (DEXCOM G7 RECEIVER) DEVI by Does not apply route.    [provider]  Continuous Blood Gluc Sensor (DEXCOM G7 SENSOR) MISC by Does not apply route.    [provider]  cyclobenzaprine (FLEXERIL) 5 MG tablet TAKE 1 TABLET BY MOUTH EVERY NIGHT AT BEDTIME 09/23/21   Joaquim Nam, MD  Dulaglutide (TRULICITY) 4.5 MG/0.5ML SOPN Inject 4.5 mg into the skin once a week.    [provider]  ezetimibe (ZETIA) 10 MG tablet Take 1 tablet (10 mg total) by mouth daily. 02/14/22   Kathleene Hazel, MD  FLUoxetine (PROZAC) 20 MG tablet TAKE 1 TABLET BY MOUTH ONCE A DAY 11/10/21   Joaquim Nam, MD  gabapentin (NEURONTIN) 100 MG capsule TAKE 1 TO 3 CAPSULES BY MOUTH 3 TIMES DAILY AS NEEDED 11/03/21   Joaquim Nam, MD  insulin aspart (NOVOLOG FLEXPEN) 100 UNIT/ML FlexPen Per sliding scale 02/26/21   Joaquim Nam, MD  insulin degludec (TRESIBA) 200 UNIT/ML FlexTouch Pen Inject 32 Units into the skin every evening. 10/14/21   Joaquim Nam, MD  JARDIANCE 25 MG TABS tablet TAKE 1 TABLET BY MOUTH ONCE A DAY 11/23/20   Carlus Pavlov, MD  metFORMIN (GLUCOPHAGE) 1000 MG tablet TAKE 1 TABLET (1,000 MG TOTAL) BY MOUTH 2 (TWO) TIMES DAILY WITH A MEAL. 10/28/19   Carlus Pavlov, MD  Multiple Vitamin (  MULTIVITAMIN) capsule Take 1 capsule by mouth daily.    [provider]  nebivolol (BYSTOLIC) 5 MG tablet Take 1 tablet (5 mg total) by mouth daily. 08/06/21   Creig Hines, NP  nitroGLYCERIN (NITROSTAT) 0.4 MG SL tablet Place 1 tablet (0.4 mg total) under the tongue every 5 (five) minutes as needed for chest pain. 08/06/21 11/04/21  Creig Hines, NP      Allergies    Diclofenac sodium, Voltaren [diclofenac sodium], Lipitor [atorvastatin], Metoprolol, and Ramipril    Review of Systems   Review of Systems   Respiratory:  Positive for shortness of breath.   Gastrointestinal:  Positive for nausea.  Neurological:  Positive for headaches.  All other systems reviewed and are negative.   Physical Exam Updated Vital Signs BP (!) 144/78 (BP Location: Right Arm)   Pulse 94   Temp 98 F (36.7 C)   Resp 16   SpO2 98%  Physical Exam Vitals and nursing note reviewed.  Constitutional:      General: He is not in acute distress.    Appearance: Normal appearance.  HENT:     Head: Normocephalic and atraumatic.  Eyes:     General:        Right eye: No discharge.        Left eye: No discharge.  Cardiovascular:     Rate and Rhythm: Normal rate and regular rhythm.     Heart sounds: No murmur heard.    No friction rub. No gallop.  Pulmonary:     Effort: Pulmonary effort is normal.     Breath sounds: Normal breath sounds.     Comments: No accessory breath sounds, wheezing, rhonchi, stridor, rales, focal consolidation noted.  No tachypnea or respiratory distress. Abdominal:     General: Bowel sounds are normal.     Palpations: Abdomen is soft.  Musculoskeletal:     Comments: Some tenderness palpation of the cervical paraspinous muscles bilaterally, no midline tenderness, step-off or deformity of the neck.  Skin:    General: Skin is warm and dry.     Capillary Refill: Capillary refill takes less than 2 seconds.  Neurological:     Mental Status: He is alert and oriented to person, place, and time.  Psychiatric:        Mood and Affect: Mood normal.        Behavior: Behavior normal.     ED Results / Procedures / Treatments   Labs (all labs ordered are listed, but only abnormal results are displayed) Labs Reviewed  CBC - Abnormal; Notable for the following components:      Result Value   WBC 10.6 (*)    All other components within normal limits  BASIC METABOLIC PANEL - Abnormal; Notable for the following components:   Glucose, Bld 140 (*)    All other components within normal limits   SARS CORONAVIRUS 2 BY RT PCR  BRAIN NATRIURETIC PEPTIDE    EKG None  Radiology DG Chest 2 View  Result Date: 02/21/2022 CLINICAL DATA:  Shortness of breath EXAM: CHEST - 2 VIEW COMPARISON:  Chest x-ray dated July 08, 2021 FINDINGS: The heart size and mediastinal contours are within normal limits. Both lungs are clear. The visualized skeletal structures are unremarkable. IMPRESSION: No active cardiopulmonary disease. Electronically Signed   By: Allegra Lai M.D.   On: 02/21/2022 15:41    Procedures Procedures    Medications Ordered in ED Medications  acetaminophen (TYLENOL) tablet 1,000 mg (1,000 mg Oral  Given 02/21/22 1514)  methocarbamol (ROBAXIN) tablet 500 mg (500 mg Oral Given 02/21/22 1515)    ED Course/ Medical Decision Making/ A&P                           Medical Decision Making Amount and/or Complexity of Data Reviewed Labs: ordered. Radiology: ordered.  Risk OTC drugs. Prescription drug management.   This patient is a 61 y.o. male who presents to the ED for concern of headache, nausea, stuffy nose, shob slightly worsened from baseline, this involves an extensive number of treatment options, and is a complaint that carries with it a high risk of complications and morbidity. The emergent differential diagnosis prior to evaluation includes, but is not limited to,  atypical ACS, asthma, copd exacerbation, CHF exacerbation, allergies, URI, vs. other.   This is not an exhaustive differential.   Past Medical History / Co-morbidities / Social History: insulin-dependent diabetes, hypertension, hyperlipidemia, obesity, coronary artery disease, congestive heart failure  Additional history: Chart reviewed. Pertinent results include: reviewed outpatient cardiology and pulmonology visits from recent months  Physical Exam: Physical exam performed. The pertinent findings include: Patient without any accessory breath sounds, abnormal oxygen saturation.  Vitals are  overall stable other than mild hypertension with blood pressure 144/78.  Lab Tests: I ordered, and personally interpreted labs.  The pertinent results include:  CBC with mild nonspecific leukocytosis, 10.6. No clinically significant anemia or thrombocytopenia.  BMP unremarkable.  BMP overall unremarkable, mild hyperglycemia in context of known diabetes, glucose of 140 seems very well controlled considering patient's insulin dependence.  COVID test negative at this time.   Imaging Studies: I ordered imaging studies including plain film chest x-ray. I independently visualized and interpreted imaging which showed no intrathoracic abnormality. I agree with the radiologist interpretation.  Considered EKG, troponin, however patient not having any active chest pain, or shortness of breath at time my evaluation just reports general worsening over the last few days.   Medications: I ordered medication including Robaxin, Tylenol for headache, neck pain. Reevaluation of the patient after these medicines showed that the patient improved. I have reviewed the patients home medicines and have made adjustments as needed.  Disposition: After consideration of the diagnostic results and the patients response to treatment, I feel that considering patient has some stuffiness, symptoms including myalgias and slightly increased shortness of breath.   emergency department workup does not suggest an emergent condition requiring admission or immediate intervention beyond what has been performed at this time. The plan is: tylenol, Ibuprofen for neck, headache, Zofran as needed for nausea, allergy medicine, and follow-up with cardiology, pulmonology for longstanding shortness of breath reevaluation. The patient is safe for discharge and has been instructed to return immediately for worsening symptoms, change in symptoms or any other concerns.  I discussed this case with my attending physician Dr. Adela Lank who cosigned this note  including patient's presenting symptoms, physical exam, and planned diagnostics and interventions. Attending physician stated agreement with plan or made changes to plan which were implemented.    Final Clinical Impression(s) / ED Diagnoses Final diagnoses:  Nausea  Nonintractable headache, unspecified chronicity pattern, unspecified headache type    Rx / DC Orders ED Discharge Orders          Ordered    ondansetron (ZOFRAN) 4 MG tablet  Every 6 hours        02/21/22 1614  Olene Floss, PA-C 02/21/22 1625    Melene Plan, DO 02/21/22 1820

## 2022-02-21 NOTE — ED Triage Notes (Signed)
Pt arrives to ED with c/o headache and nausea that started x4 days ago. Associated symptoms include stuffy nose.

## 2022-02-21 NOTE — ED Provider Triage Note (Signed)
Emergency Medicine Provider Triage Evaluation Note  GARLIN BATDORF , a 61 y.o. male  was evaluated in triage.  Pt complains of shortness of breath, headache, congestion with nausea and vomiting that started 3 days ago. Sent to the ER from [facility] due to cardiac history. Patient denies chest pain.  Physical Exam  BP (!) 148/106 (BP Location: Right Arm)   Pulse 96   Temp 98.2 F (36.8 C) (Oral)   Resp 18   Ht 6\' 2"  (1.88 m)   Wt 132 kg   SpO2 96%   BMI 37.36 kg/m  Gen:   Awake, no distress   Resp:  Normal effort  MSK:   Moves extremities without difficulty  Other:    Medical Decision Making  Medically screening exam initiated at 12:26 PM.  Appropriate orders placed.  was informed that the remainder of the evaluation will be completed by another provider, this initial triage assessment does not replace that evaluation, and the importance of remaining in the ED until their evaluation is complete.   [name], FNP 02/21/22 1227

## 2022-02-21 NOTE — ED Notes (Signed)
Patient transported to X-ray 

## 2022-02-21 NOTE — Discharge Instructions (Signed)
Your work-up today was reassuring, do not see any evidence of worsening heart disease, or active lung disease at this time, as you have been having some ongoing shortness of breath with exertion I do recommend that you follow-up with your pulmonologist, cardiologist.  I sent some nausea medicine to your pharmacy that you can use as needed.  In the meantime please return to the emergency department if you have worsening shortness of breath, chest pain.  Considering that your symptoms seem to worsen after mowing the lawn this weekend you may benefit from some allergy medication.

## 2022-02-21 NOTE — Telephone Encounter (Signed)
Patient wife Marchelle Folks called in stating that [name] has been having difficulty breathing, neck pain, headache, and nauseous. Sent over to triage.

## 2022-02-21 NOTE — ED Triage Notes (Signed)
Pt to ED via POV from [facility] due to SOB. Pt reports increased SOB, HA, congestion and N/V that started on Friday.  Pt recent heart cath in February.

## 2022-02-22 NOTE — Telephone Encounter (Signed)
Will await follow up note.

## 2022-03-01 ENCOUNTER — Other Ambulatory Visit: Payer: Self-pay | Admitting: Family Medicine

## 2022-03-08 ENCOUNTER — Ambulatory Visit (INDEPENDENT_AMBULATORY_CARE_PROVIDER_SITE_OTHER): Payer: BC Managed Care – PPO | Admitting: Family Medicine

## 2022-03-08 ENCOUNTER — Encounter: Payer: Self-pay | Admitting: Family Medicine

## 2022-03-08 DIAGNOSIS — R110 Nausea: Secondary | ICD-10-CM | POA: Diagnosis not present

## 2022-03-08 NOTE — Progress Notes (Unsigned)
He had HA, nausea, congestion.  Went to ER.  Emergency room course and labs discussed with patient.  Recent cath w/o stent (08/2021).  He has pulmonary f/u pending.    In the interval, he had a lesion removed from R side of neck.  I am awaiting his dermatology report.  Followed by dermatology.  Meds, vitals, and allergies reviewed.   ROS: Per HPI unless specifically indicated in ROS section   Nad Ncat Neck supple, no LA, he has healing suture line inferior and posterior to right ear.  This appears to be healing normally.  No erythema.  No discharge. Rrr Ctab Skin well perfused.

## 2022-03-08 NOTE — Patient Instructions (Signed)
Update me as needed.  I'll await your follow up notes.  Take care.  Glad to see you.

## 2022-03-09 DIAGNOSIS — R110 Nausea: Secondary | ICD-10-CM | POA: Insufficient documentation

## 2022-03-09 NOTE — Assessment & Plan Note (Signed)
Fortunately this resolved in the meantime.  The assumption is that he had a benign viral process that has self resolved.  His lungs are clear and he is okay for outpatient follow-up.  He is going to follow-up with dermatology about the skin lesion above.  I am awaiting his consult notes.

## 2022-03-18 ENCOUNTER — Encounter: Payer: Self-pay | Admitting: Nurse Practitioner

## 2022-03-18 ENCOUNTER — Other Ambulatory Visit: Payer: Self-pay | Admitting: *Deleted

## 2022-03-18 ENCOUNTER — Ambulatory Visit (INDEPENDENT_AMBULATORY_CARE_PROVIDER_SITE_OTHER): Payer: BC Managed Care – PPO | Admitting: Nurse Practitioner

## 2022-03-18 VITALS — BP 128/72 | HR 81 | Temp 97.9°F | Ht 74.0 in | Wt 303.4 lb

## 2022-03-18 DIAGNOSIS — Z6838 Body mass index (BMI) 38.0-38.9, adult: Secondary | ICD-10-CM | POA: Diagnosis not present

## 2022-03-18 DIAGNOSIS — R5381 Other malaise: Secondary | ICD-10-CM

## 2022-03-18 DIAGNOSIS — E6609 Other obesity due to excess calories: Secondary | ICD-10-CM | POA: Diagnosis not present

## 2022-03-18 DIAGNOSIS — G4733 Obstructive sleep apnea (adult) (pediatric): Secondary | ICD-10-CM | POA: Diagnosis not present

## 2022-03-18 NOTE — Progress Notes (Signed)
error 

## 2022-03-18 NOTE — Assessment & Plan Note (Signed)
Encouraged to exercise 150 min/week, divided up for whatever works for his schedule. Work on slowly increasing activity levels.

## 2022-03-18 NOTE — Assessment & Plan Note (Addendum)
BMI 38.9. Healthy weight loss encouraged. DOE suspected to be related to deconditioning and obesity. Referred to healthy weight management today.

## 2022-03-18 NOTE — Patient Instructions (Signed)
Continue Albuterol inhaler 2 puffs every 6 hours as needed for shortness of breath or wheezing. Notify if symptoms persist despite rescue inhaler/neb use.   We discussed how untreated sleep apnea puts an individual at risk for cardiac arrhthymias, pulm HTN, DM, stroke and increases their risk for daytime accidents; although, this are less likely with your mild severity. Be aware of drowsy driving and pull over if you become sleepy. Plan for treatment right now is working on healthy weight loss and side lying sleeping position.   Referral to healthy weight management sent today - someone will contact you for scheduling  Follow up in 6 months with Dr. Vassie Loll. If symptoms do not improve or worsen, please contact office for sooner follow up or seek emergency care.

## 2022-03-18 NOTE — Progress Notes (Signed)
@Patient  ID: , male    DOB: 1960/11/24, 61 y.o.   MRN: [id number]  Chief Complaint  Patient presents with   Follow-up    Pt had a recent sleep study performed and is here today to discuss. Pt states he has been doing okay since last visit.    Referring provider: [id number], MD  HPI: 61 year old male, former smoker followed for DOE and OSA. He is a patient of Dr. 68 and last seen in office on 10/04/2021. Past medical history significant for CHF, HTN, CAD, DM, HLD.   TEST/EVENTS:  10/04/2021 PFT: FVC 82, FEV1 83, ratio 79, TLC 75, DLCOcor 87 02/03/2022 HST: AHI 12/hr  10/04/2021: OV with Dr. 10/06/2021. Continues to complain of SOB. PFTs overall normal aside from mild extrathoracic restriction. No desaturations on previous walking oximetry. His wife is concerned about loud snoring. He was previously diagnosed with OSA prior to lap band; stopped using machine after he lost weight. Reassess with HST. Attributes dyspnea to deconditioning and obesity. Cardiac and pulmonary workup negative. Encouraged exercise.  03/18/2022: Today - follow up Patient presents today for follow up after undergoing home sleep study, which showed mild OSA with AHI of 12. He has been using snoring strips, which help with his snoring. He sleeps well most nights; will toss and turn some due to shoulder discomfort. He will occasionally wake up feeling poorly rested, but not on a daily basis. He denies morning headaches or drowsy driving. He used to be on CPAP therapy years ago but has been off of it for some time now.  His breathing is overall stable. He gets winded with longer distances or climbing. He doesn't use his albuterol often; hasn't noticed a huge difference but occasionally it will help. He does want to work on weight loss measures. He helps take care of his wife and his mother in law, which puts stress on him and has caused him to not take time to care for himself. No significant cough, wheezing, chest  congestion, lower extremity swelling, orthopnea, PND.  Allergies  Allergen Reactions   Diclofenac Sodium Shortness Of Breath, Itching and Other (See Comments)    Hypotension    Voltaren [Diclofenac Sodium] Anaphylaxis   Lipitor [Atorvastatin]     Myalgias at 80mg  dose   Metoprolol     Lightheaded/dizzy/SOB.     Ramipril     Lightheaded/dizzy/SOB.      Immunization History  Administered Date(s) Administered   Influenza Split 04/11/2011, 04/28/2015   Influenza Whole 05/11/2004   Influenza, Seasonal, Injecte, Preservative Fre 06/05/2012   Influenza,inj,Quad PF,6+ Mos 04/01/2013, 04/06/2017, 05/03/2018, 05/09/2019, 04/22/2020, 05/14/2021   Influenza-Unspecified 04/25/2014, 04/12/2016   Moderna Sars-Covid-2 Vaccination 09/02/2019, 10/01/2019   Pneumococcal Polysaccharide-23 05/18/2015   Pneumococcal-Unspecified 07/11/2006   Td 07/11/1997   Tdap 08/26/2010, 02/26/2021    Past Medical History:  Diagnosis Date   Allergic rhinitis, cause unspecified    Chronic diastolic CHF (congestive heart failure) (HCC)    a. 03/2017 Echo: EF 55%, Gr1DD, nl RV fxn.   Degeneration of intervertebral disc, site unspecified    Diabetes mellitus    Hyperlipidemia    Hypertension    Impotence of organic origin    Nonobstructive CAD    a. 03/2014 Cath: LM nl, LAD nl, D1 30ost/p, LCX nl, RCA 30d, EF 60%; b. 03/2017 MV: EF 56%, small, moderate basal inf/mid inf defect->prob inf thinning vs mild ischemia-->low-risk; c. 08/2021 Cath: LM nl, LAD 20ost/p, LCX large, 69m, RCA large,  30d, EF 50-55%. Nl filling pressures-->Med rx.   Obesity, unspecified    Obstructive sleep apnea (adult) (pediatric)    Other chronic allergic conjunctivitis    Personal history of colonic polyps     Tobacco History: Social History   Tobacco Use  Smoking Status Former   Packs/day: 0.25   Years: 2.00   Total pack years: 0.50   Types: Cigarettes  Smokeless Tobacco Never   Counseling given: Not Answered   Outpatient  Medications Prior to Visit  Medication Sig Dispense Refill   albuterol (VENTOLIN HFA) 108 (90 Base) MCG/ACT inhaler Inhale 1-2 puffs into the lungs every 6 (six) hours as needed for wheezing or shortness of breath. 1 each 1   aspirin EC 81 MG tablet Take 81 mg by mouth daily.     Continuous Blood Gluc Receiver (DEXCOM G7 RECEIVER) DEVI by Does not apply route.     Continuous Blood Gluc Sensor (DEXCOM G7 SENSOR) MISC by Does not apply route.     cyclobenzaprine (FLEXERIL) 5 MG tablet TAKE 1 TABLET BY MOUTH EVERY NIGHT AT BEDTIME 30 tablet 1   Dulaglutide (TRULICITY) 4.5 MG/0.5ML SOPN Inject 4.5 mg into the skin once a week.     ezetimibe (ZETIA) 10 MG tablet Take 1 tablet (10 mg total) by mouth daily. 90 tablet 3   FLUoxetine (PROZAC) 20 MG tablet TAKE 1 TABLET BY MOUTH ONCE A DAY 90 tablet 1   gabapentin (NEURONTIN) 100 MG capsule TAKE 1 TO 3 CAPSULES BY MOUTH 3 TIMES DAILY AS NEEDED 270 capsule 1   insulin aspart (NOVOLOG FLEXPEN) 100 UNIT/ML FlexPen Per sliding scale     insulin degludec (TRESIBA) 200 UNIT/ML FlexTouch Pen Inject 32 Units into the skin every evening.     JARDIANCE 25 MG TABS tablet TAKE 1 TABLET BY MOUTH ONCE A DAY 30 tablet 2   metFORMIN (GLUCOPHAGE) 1000 MG tablet TAKE 1 TABLET (1,000 MG TOTAL) BY MOUTH 2 (TWO) TIMES DAILY WITH A MEAL. 180 tablet 2   Multiple Vitamin (MULTIVITAMIN) capsule Take 1 capsule by mouth daily.     nebivolol (BYSTOLIC) 5 MG tablet Take 1 tablet (5 mg total) by mouth daily. 90 tablet 2   ondansetron (ZOFRAN) 4 MG tablet Take 1 tablet (4 mg total) by mouth every 6 (six) hours. 12 tablet 0   nitroGLYCERIN (NITROSTAT) 0.4 MG SL tablet Place 1 tablet (0.4 mg total) under the tongue every 5 (five) minutes as needed for chest pain. 25 tablet 3   No facility-administered medications prior to visit.     Review of Systems:   Constitutional: No weight loss or gain, night sweats, fevers, chills, or lassitude. +occasional fatigue HEENT: No headaches,  difficulty swallowing, tooth/dental problems, or sore throat. No sneezing, itching, ear ache, nasal congestion, or post nasal drip CV:  No chest pain, orthopnea, PND, swelling in lower extremities, anasarca, dizziness, palpitations, syncope Resp: +snoring (improved); shortness of breath with exertion (stable). No excess mucus or change in color of mucus. No productive or non-productive. No hemoptysis. No wheezing.  No chest wall deformity GI:  No heartburn, indigestion, abdominal pain, nausea, vomiting, diarrhea, change in bowel habits, loss of appetite, bloody stools.  Neuro: No dizziness or lightheadedness.  Psych: No depression or anxiety. Mood stable.     Physical Exam:  BP 128/72 (BP Location: Right Arm, Patient Position: Sitting, Cuff Size: Large)   Pulse 81   Temp 97.9 F (36.6 C) (Oral)   Ht 6\' 2"  (1.88 m)   Wt )  303 lb 6.4 oz (137.6 kg)   SpO2 98% Comment: RA  BMI 38.95 kg/m   GEN: Pleasant, interactive, well-appearing; obese; in no acute distress. HEENT:  Normocephalic and atraumatic. PERRLA. Sclera white. Nasal turbinates pink, moist and patent bilaterally. No rhinorrhea present. Oropharynx pink and moist, without exudate or edema. No lesions, ulcerations, or postnasal drip.  NECK:  Supple w/ fair ROM.  CV: RRR, no m/r/g, no peripheral edema. Pulses intact, +2 bilaterally. No cyanosis, pallor or clubbing. PULMONARY:  Unlabored, regular breathing. Clear bilaterally A&P w/o wheezes/rales/rhonchi. No accessory muscle use. No dullness to percussion. GI: BS present and normoactive. Soft, non-tender to palpation. No organomegaly or masses detected. No CVA tenderness. MSK: No erythema, warmth or tenderness. Cap refil <2 sec all extrem. No deformities or joint swelling noted.  Neuro: A/Ox3. No focal deficits noted.   Skin: Warm, no lesions or rashe Psych: Normal affect and behavior. Judgement and thought content appropriate.     Lab Results:  CBC    Component Value  Date/Time   WBC 10.6 (H) 02/21/2022 1523   RBC 4.99 02/21/2022 1523   HGB 15.0 02/21/2022 1523   HGB 16.4 08/06/2021 0946   HCT 44.2 02/21/2022 1523   HCT 48.9 08/06/2021 0946   PLT 291 02/21/2022 1523   PLT 335 08/06/2021 0946   MCV 88.6 02/21/2022 1523   MCV 89 08/06/2021 0946   MCH 30.1 02/21/2022 1523   MCHC 33.9 02/21/2022 1523   RDW 12.7 02/21/2022 1523   RDW 12.5 08/06/2021 0946   LYMPHSABS 3.6 10/14/2021 1242   MONOABS 0.4 10/14/2021 1242   EOSABS 0.6 10/14/2021 1242   BASOSABS 0.1 10/14/2021 1242    BMET    Component Value Date/Time   NA 140 02/21/2022 1523   NA 141 08/06/2021 0946   K 4.4 02/21/2022 1523   CL 105 02/21/2022 1523   CO2 27 02/21/2022 1523   GLUCOSE 140 (H) 02/21/2022 1523   GLUCOSE 260 (H) 07/24/2006 1009   BUN 18 02/21/2022 1523   BUN 22 08/06/2021 0946   CREATININE 0.90 02/21/2022 1523   CREATININE 1.02 09/27/2017 1209   CALCIUM 9.5 02/21/2022 1523   GFRNONAA >60 02/21/2022 1523   GFRNONAA 81 09/27/2017 1209   GFRAA 81 05/10/2019 1129   GFRAA 93 09/27/2017 1209    BNP    Component Value Date/Time   BNP 32.5 02/21/2022 1523     Imaging:  DG Chest 2 View  Result Date: 02/21/2022 CLINICAL DATA:  Shortness of breath EXAM: CHEST - 2 VIEW COMPARISON:  Chest x-ray dated July 08, 2021 FINDINGS: The heart size and mediastinal contours are within normal limits. Both lungs are clear. The visualized skeletal structures are unremarkable. IMPRESSION: No active cardiopulmonary disease. Electronically Signed   By: Allegra Lai M.D.   On: 02/21/2022 15:41         Latest Ref Rng & Units 10/04/2021    9:01 AM 07/15/2014    2:49 PM  PFT Results  FVC-Pre L 4.47  4.42   FVC-Predicted Pre % 82  78   FVC-Post L 4.43  4.50   FVC-Predicted Post % 81  80   Pre FEV1/FVC % % 76  80   Post FEV1/FCV % % 79  84   FEV1-Pre L 3.39  3.53   FEV1-Predicted Pre % 83  82   FEV1-Post L 3.51  3.77   DLCO uncorrected ml/min/mmHg 26.99  30.95   DLCO UNC% %  87  82   DLCO corrected  ml/min/mmHg 26.99    DLCO COR %Predicted % 87    DLVA Predicted % 104  99   TLC L 5.91  5.95   TLC % Predicted % 75  76   RV % Predicted % 51  56     No results found for: "NITRICOXIDE"      Assessment & Plan:   OSA (obstructive sleep apnea) Mild OSA with minimal symptoms. Snoring improved with strips. He would like to move forward with conservative management and work on weight loss efforts.   Patient Instructions  Continue Albuterol inhaler 2 puffs every 6 hours as needed for shortness of breath or wheezing. Notify if symptoms persist despite rescue inhaler/neb use.   We discussed how untreated sleep apnea puts an individual at risk for cardiac arrhthymias, pulm HTN, DM, stroke and increases their risk for daytime accidents; although, this are less likely with your mild severity. Be aware of drowsy driving and pull over if you become sleepy. Plan for treatment right now is working on healthy weight loss and side lying sleeping position.   Referral to healthy weight management sent today - someone will contact you for scheduling  Follow up in 6 months with Dr. Vassie Loll. If symptoms do not improve or worsen, please contact office for sooner follow up or seek emergency care.     Obesity (BMI 30-39.9) BMI 38.9. Healthy weight loss encouraged. DOE suspected to be related to deconditioning and obesity. Referred to healthy weight management today.   Physical deconditioning Encouraged to exercise 150 min/week, divided up for whatever works for his schedule. Work on slowly increasing activity levels.    I spent 35 minutes of dedicated to the care of this patient on the date of this encounter to include pre-visit review of records, face-to-face time with the patient discussing conditions above, post visit ordering of testing, clinical documentation with the electronic health record, making appropriate referrals as documented, and communicating necessary findings to  members of the patients care team.  Noemi Chapel, NP 03/18/2022  Pt aware and understands NP's role.

## 2022-03-18 NOTE — Assessment & Plan Note (Signed)
Mild OSA with minimal symptoms. Snoring improved with strips. He would like to move forward with conservative management and work on weight loss efforts.   Patient Instructions  Continue Albuterol inhaler 2 puffs every 6 hours as needed for shortness of breath or wheezing. Notify if symptoms persist despite rescue inhaler/neb use.   We discussed how untreated sleep apnea puts an individual at risk for cardiac arrhthymias, pulm HTN, DM, stroke and increases their risk for daytime accidents; although, this are less likely with your mild severity. Be aware of drowsy driving and pull over if you become sleepy. Plan for treatment right now is working on healthy weight loss and side lying sleeping position.   Referral to healthy weight management sent today - someone will contact you for scheduling  Follow up in 6 months with Dr. Vassie Loll. If symptoms do not improve or worsen, please contact office for sooner follow up or seek emergency care.

## 2022-03-28 DIAGNOSIS — Z0289 Encounter for other administrative examinations: Secondary | ICD-10-CM

## 2022-03-29 ENCOUNTER — Encounter (INDEPENDENT_AMBULATORY_CARE_PROVIDER_SITE_OTHER): Payer: Self-pay | Admitting: Internal Medicine

## 2022-03-29 ENCOUNTER — Ambulatory Visit (INDEPENDENT_AMBULATORY_CARE_PROVIDER_SITE_OTHER): Payer: Self-pay | Admitting: Internal Medicine

## 2022-03-29 VITALS — Ht 74.0 in | Wt 286.0 lb

## 2022-03-29 DIAGNOSIS — Z9884 Bariatric surgery status: Secondary | ICD-10-CM

## 2022-03-29 DIAGNOSIS — Z6836 Body mass index (BMI) 36.0-36.9, adult: Secondary | ICD-10-CM

## 2022-03-29 DIAGNOSIS — R0609 Other forms of dyspnea: Secondary | ICD-10-CM

## 2022-03-29 DIAGNOSIS — I10 Essential (primary) hypertension: Secondary | ICD-10-CM

## 2022-03-29 DIAGNOSIS — R5383 Other fatigue: Secondary | ICD-10-CM

## 2022-03-29 DIAGNOSIS — E1165 Type 2 diabetes mellitus with hyperglycemia: Secondary | ICD-10-CM

## 2022-03-29 NOTE — Progress Notes (Unsigned)
  Office: [phone]  /  Fax: [phone]  New Patient Consultation  Enis Slipper was seen in clinic today to evaluate for obesity. We did a consultation to discuss his options for treatment and educate the patient on his disease state. He was referred by his pulmonologist. He is interested in reaching a healthier weight to improve his exertional shortness of breath. He has been evaluated by cardiology and pulmonary. He has a history of T2DM, CAD and HFpEF per medical records. Had lap band 2012-2014 with a peak weight of 400. He still feels band is operational but has not seen surgeon in about 3-4 years. He is not following a nutritional plan at present. Acknowledges problems with hunger signals. He sees endo and has a CGM. Cares for wife and MIL. Father had Parkinson's. 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    Jossie Ng Beane's evaluation and workup began today. He was weighed on the bioimpedance scale and results were discussed and documented in the synopsis. Height 6\' 2"  (1.88 m), weight 286 lb (129.7 kg). Body mass index is 36.72 kg/m.   Obesity education preformed today:  We discussed obesity as a disease and the importance of a more detailed evaluation of all the factors contributing to the disease.  We discussed the importance of long term lifestyle changes which include nutrition, exercise and behavioral modifications as well as the importance of customizing this to his specific health and social needs.  We discussed the benefits of reaching a healthier weight to alleviate the symptoms or reduce the risks of biomechanical, metabolic and psychological effects of obesity.  Current health conditions we discussed, and we expect to improve include:   Class 2 severe obesity with serious comorbidity and body mass index (BMI) of 36.0 to 36.9 in adult, unspecified obesity type ([place])  DOE (dyspnea on exertion)  Essential hypertension  Poorly controlled type 2 diabetes mellitus with  circulatory disorder ([place]).  We discussed the goals of this program is to improve his overall health and not simply achieve a specific BMI.  Frequent visits are very important to patient success. I plan to see him every 2 weeks for the first 3 months and then evaluate the visit frequency after that time. I explained obesity is a life-long chronic disease and long term treatments would be required. Medications to help him follow his eating plan may be offered as appropriate but are not required. All medication decisions will be made together after the initial workup is done and benefits and side effects are discussed in depth.  The clinic rules were reviewed including the late policy, cancellation policy, no show and program fees.  Enis Slipper appears to be in the action stage of change and agrees they are ready to start intensive lifestyle modifications and behavioral modifications. We will schedule a longer visit to continue the evaluation including fasting labs, indirect calorimetry, ECG and a full nutritional and lifestyle evaluation.  25 minutes was spent today on this visit including the above counseling, pre-visit chart review, and post-visit documentation.

## 2022-04-09 ENCOUNTER — Other Ambulatory Visit: Payer: Self-pay | Admitting: Family Medicine

## 2022-04-11 ENCOUNTER — Encounter (INDEPENDENT_AMBULATORY_CARE_PROVIDER_SITE_OTHER): Payer: Self-pay | Admitting: Internal Medicine

## 2022-04-11 ENCOUNTER — Ambulatory Visit (INDEPENDENT_AMBULATORY_CARE_PROVIDER_SITE_OTHER): Payer: Self-pay | Admitting: Internal Medicine

## 2022-04-11 VITALS — BP 115/69 | HR 78 | Temp 97.7°F | Ht 73.0 in | Wt 286.0 lb

## 2022-04-11 DIAGNOSIS — Z6836 Body mass index (BMI) 36.0-36.9, adult: Secondary | ICD-10-CM

## 2022-04-11 DIAGNOSIS — Z7984 Long term (current) use of oral hypoglycemic drugs: Secondary | ICD-10-CM

## 2022-04-11 DIAGNOSIS — E1169 Type 2 diabetes mellitus with other specified complication: Secondary | ICD-10-CM

## 2022-04-11 DIAGNOSIS — R0602 Shortness of breath: Secondary | ICD-10-CM

## 2022-04-11 DIAGNOSIS — E559 Vitamin D deficiency, unspecified: Secondary | ICD-10-CM

## 2022-04-11 DIAGNOSIS — R5383 Other fatigue: Secondary | ICD-10-CM

## 2022-04-11 DIAGNOSIS — Z9884 Bariatric surgery status: Secondary | ICD-10-CM

## 2022-04-11 DIAGNOSIS — I10 Essential (primary) hypertension: Secondary | ICD-10-CM

## 2022-04-11 DIAGNOSIS — Z7985 Long-term (current) use of injectable non-insulin antidiabetic drugs: Secondary | ICD-10-CM

## 2022-04-11 DIAGNOSIS — E785 Hyperlipidemia, unspecified: Secondary | ICD-10-CM

## 2022-04-11 DIAGNOSIS — Z1331 Encounter for screening for depression: Secondary | ICD-10-CM

## 2022-04-11 NOTE — Telephone Encounter (Signed)
Refill request for GABAPENTIN 100 MG CAP  LOV - 03/08/22 Next OV - not scheduled Last refill - 11/03/21 #270/1

## 2022-04-12 LAB — VITAMIN D 25 HYDROXY (VIT D DEFICIENCY, FRACTURES): Vit D, 25-Hydroxy: 28.8 ng/mL — ABNORMAL LOW (ref 30.0–100.0)

## 2022-04-12 LAB — VITAMIN B12: Vitamin B-12: 421 pg/mL (ref 232–1245)

## 2022-04-12 LAB — HEMOGLOBIN A1C
Est. average glucose Bld gHb Est-mCnc: 200 mg/dL
Hgb A1c MFr Bld: 8.6 % — ABNORMAL HIGH (ref 4.8–5.6)

## 2022-04-12 LAB — TSH: TSH: 1.76 u[IU]/mL (ref 0.450–4.500)

## 2022-04-19 NOTE — Progress Notes (Unsigned)
Chief Complaint:   OBESITY [name] (MR# [id number]) is a 61 y.o. male who presents for evaluation and treatment of obesity and related comorbidities. Current BMI is Body mass index is 37.73 kg/m. [name] has been struggling with his weight for many years and has been unsuccessful in either losing weight, maintaining weight loss, or reaching his healthy weight goal.  [name] has a history of lap band surgery in 2014, and his peak weight was 400 pounds and current weight is 286 pounds with the need of 240 pounds.  His desired weight loss is between 235 to 240 pounds.  He acknowledges not following a nutritional plan.  He notes some physical activities but he occasionally eats out, snacks, and skips meals.  Associated: Type 2 diabetes, HF PEF, hyperlipidemia, hypertension, shortness of breath, obstructive sleep apnea.  [name] is currently in the action stage of change and ready to dedicate time achieving and maintaining a healthier weight. [name] is interested in becoming our patient and working on intensive lifestyle modifications including (but not limited to) diet and exercise for weight loss.  [name]'s habits were reviewed today and are as follows: His family eats meals together, he thinks his family will eat healthier with him, his desired weight loss is 46-51 lbs, he has been heavy most of his life, he started gaining weight after his leg surgery, his heaviest weight ever was 400 pounds, he has significant food cravings issues, he snacks frequently in the evenings, he skips meals frequently, he is frequently drinking liquids with calories, and he struggles with emotional eating.  Depression Screen [name]'s Food and Mood (modified PHQ-9) score was 5.     04/11/2022    9:02 AM  Depression screen PHQ 2/9  Decreased Interest 1  Down, Depressed, Hopeless 1  PHQ - 2 Score 2  Altered sleeping 1  Tired, decreased energy 1  Change in appetite 1  Feeling bad or failure about yourself  0  Trouble  concentrating 0  Moving slowly or fidgety/restless 0  Suicidal thoughts 0  PHQ-9 Score 5  Difficult doing work/chores Not difficult at all   Subjective:   1. Other fatigue [name] admits to daytime somnolence and admits to waking up still tired. Patient has a history of symptoms of daytime fatigue and morning fatigue. [name] generally gets 6 or 8 hours of sleep per night, and states that he has generally restful sleep. Snoring is not present. Apneic episodes are not present. Epworth Sleepiness Score is 3.   2. SOBOE (shortness of breath on exertion) [name] notes increasing shortness of breath with exercising and seems to be worsening over time with weight gain. He notes getting out of breath sooner with activity than he used to. This has not gotten worse recently. [name] denies shortness of breath at rest or orthopnea.  3. Type 2 diabetes mellitus with other specified complication, without long-term current use of insulin (HCC) [name]'s A1c has decreased from 9.5-8.0 on 01/17/2022.  He is on Jardiance, metformin, Trulicity, and [name].  He is followed by endocrinology.  Cautioned about weight loss and nutrition for low blood [name].  Overall improved glucose control.  Reviewed goals of care.  I discussed labs with the patient today.  4. Hx of laparoscopic gastric banding [name] peak weight was 400 pounds.  He has not seen a Careers adviser.  He has no significant complications or lack of functioning.  His nadir weight is 240 pounds.  5. Hyperlipidemia, unspecified hyperlipidemia type [name] last LDL  was in the 130s.  He is intolerant to statin, elevated ASCVD risk score, had low vitamin D level in the past which may affect tolerance.  I discussed labs with the patient today.  6. Vitamin D deficiency [name]'s last vitamin D level was in the 80s.  He has a history of statin intolerance.  7. Essential hypertension [name] blood pressure is at goal.  No orthostasis, and normal renal parameters.  I discussed labs  with the patient today.  Assessment/Plan:   1. Other fatigue [name] does feel that his weight is causing his energy to be lower than it should be. Fatigue may be related to obesity, depression or many other causes. Labs will be ordered, and in the meanwhile, [name] will focus on self care including making healthy food choices, increasing physical activity and focusing on stress reduction.  - TSH - Vitamin B12  2. SOBOE (shortness of breath on exertion) [name] does feel that he gets out of breath more easily that he used to when he exercises. [name]'s shortness of breath appears to be obesity related and exercise induced. He has agreed to work on weight loss and gradually increase exercise to treat his exercise induced shortness of breath. Will continue to monitor closely.  3. Type 2 diabetes mellitus with other specified complication, without long-term current use of insulin (HCC) We will check labs today.  [name] will continue his medications with monitoring for low blood glucose and the need to decrease insulin.  He will continue with weight loss therapy, and low carbohydrate, high-protein diet.  - Hemoglobin A1c  4. Hx of laparoscopic gastric banding Reduced calorie meal plan was provided, and [name] will work on increasing protein.  5. Hyperlipidemia, unspecified hyperlipidemia type We will check labs today, replenish and rechallenge with statin at some point.  6. Vitamin D deficiency We will check labs today, replenish her goal between 50-60, and rechallenge with statin.  - VITAMIN D 25 Hydroxy (Vit-D Deficiency, Fractures)  7. Essential hypertension [name] will continue with his blood pressure medications, and will maintain adequate hydration.  8. Depression screening [name] had a positive depression screening. Depression is commonly associated with obesity and often results in emotional eating behaviors. We will monitor this closely and work on CBT to help improve the non-hunger eating  patterns. Referral to Psychology may be required if no improvement is seen as he continues in our clinic.  9. Class 2 severe obesity with serious comorbidity and body mass index (BMI) of 36.0 to 36.9 in adult, unspecified obesity type [name]) [name] is currently in the action stage of change and his goal is to continue with weight loss efforts. I recommend [name] begin the structured treatment plan as follows:  He has agreed to the Category 2 Plan.  Reduced calorie, high-protein meal plan provided.  Continue GLP-1 drug as per endocrinology for hunger signals.  Exercise goals: As is.    Behavioral modification strategies: increasing lean protein intake, decreasing simple carbohydrates, increasing water intake, decreasing eating out, no skipping meals, keeping healthy foods in the home, better snacking choices, avoiding temptations, and planning for success.  He was informed of the importance of frequent follow-up visits to maximize his success with intensive lifestyle modifications for his multiple health conditions. He was informed we would discuss his lab results at his next visit unless there is a critical issue that needs to be addressed sooner. [name] agreed to keep his next visit at the agreed upon time to discuss these results.  Objective:  Blood pressure 115/69, pulse 78, temperature 97.7 F (36.5 C), height 6\' 1"  (1.854 m), weight 286 lb (129.7 kg), SpO2 99 %. Body mass index is 37.73 kg/m.  EKG: Normal sinus rhythm, rate 79 BPM.  Indirect Calorimeter completed today shows a VO2 of 236 and a REE of 1627.  His calculated basal metabolic rate is thus his basal metabolic rate is worse than expected.  General: Cooperative, alert, well developed, in no acute distress. HEENT: Conjunctivae and lids unremarkable. Cardiovascular: Regular rhythm.  Lungs: Normal work of breathing. Neurologic: No focal deficits.   Lab Results  Component Value Date   CREATININE 0.90 02/21/2022   BUN 18  02/21/2022   NA 140 02/21/2022   K 4.4 02/21/2022   CL 105 02/21/2022   CO2 27 02/21/2022   Lab Results  Component Value Date   ALT 12 10/14/2021   AST 14 10/14/2021   ALKPHOS 107 10/14/2021   BILITOT 0.6 10/14/2021   Lab Results  Component Value Date   HGBA1C 8.6 (H) 04/11/2022   HGBA1C 9.5 (A) 07/15/2020   HGBA1C 10.0 (A) 12/24/2019   HGBA1C 14.2 (H) 08/05/2019   HGBA1C 10.0 (H) 01/18/2018   No results found for: "INSULIN" Lab Results  Component Value Date   TSH 1.760 04/11/2022   Lab Results  Component Value Date   CHOL 197 02/14/2022   HDL 44 02/14/2022   LDLCALC 134 (H) 02/14/2022   TRIG 106 02/14/2022   CHOLHDL 4.5 02/14/2022   Lab Results  Component Value Date   WBC 10.6 (H) 02/21/2022   HGB 15.0 02/21/2022   HCT 44.2 02/21/2022   MCV 88.6 02/21/2022   PLT 291 02/21/2022   No results found for: "IRON", "TIBC", "FERRITIN"  Attestation Statements:   Reviewed by clinician on day of visit: allergies, medications, problem list, medical history, surgical history, family history, social history, and previous encounter notes.  Time spent on visit including pre-visit chart review and post-visit charting and care was 40 minutes.   I, 02/23/2022, am acting as transcriptionist for Burt Knack, MD.  I have reviewed the above documentation for accuracy and completeness, and I agree with the above. - ***

## 2022-04-25 ENCOUNTER — Encounter (INDEPENDENT_AMBULATORY_CARE_PROVIDER_SITE_OTHER): Payer: Self-pay | Admitting: Internal Medicine

## 2022-04-25 ENCOUNTER — Ambulatory Visit (INDEPENDENT_AMBULATORY_CARE_PROVIDER_SITE_OTHER): Payer: Self-pay | Admitting: Internal Medicine

## 2022-04-25 VITALS — BP 110/70 | HR 90 | Temp 98.0°F | Ht 74.0 in | Wt 283.0 lb

## 2022-04-25 DIAGNOSIS — E119 Type 2 diabetes mellitus without complications: Secondary | ICD-10-CM | POA: Insufficient documentation

## 2022-04-25 DIAGNOSIS — I10 Essential (primary) hypertension: Secondary | ICD-10-CM

## 2022-04-25 DIAGNOSIS — E559 Vitamin D deficiency, unspecified: Secondary | ICD-10-CM

## 2022-04-25 DIAGNOSIS — E66812 Obesity, class 2: Secondary | ICD-10-CM | POA: Insufficient documentation

## 2022-04-25 DIAGNOSIS — Z794 Long term (current) use of insulin: Secondary | ICD-10-CM | POA: Insufficient documentation

## 2022-04-25 DIAGNOSIS — E7849 Other hyperlipidemia: Secondary | ICD-10-CM

## 2022-04-25 DIAGNOSIS — E669 Obesity, unspecified: Secondary | ICD-10-CM

## 2022-04-25 DIAGNOSIS — Z6836 Body mass index (BMI) 36.0-36.9, adult: Secondary | ICD-10-CM

## 2022-04-25 MED ORDER — VITAMIN D (ERGOCALCIFEROL) 1.25 MG (50000 UNIT) PO CAPS: 50000.0000 [IU] | ORAL_CAPSULE | ORAL | 0 refills | Status: DC

## 2022-04-27 NOTE — Progress Notes (Signed)
Chief Complaint:   OBESITY [name] is here to discuss his progress with his obesity treatment plan along with follow-up of his obesity related diagnoses. [name] is on the Category 2 Plan and states he is following his eating plan approximately 100% of the time. [name] states he is doing split wood, yard work, and walking for 30 minutes 3-4 times per week.  Today's visit was #: 2 Starting weight: 286 lbs Starting date: 04/11/2022 Today's weight: 283 lbs Today's date: 04/25/2022 Total lbs lost to date: 3 Total lbs lost since last in-office visit: 3  Interim History: [name] is doing well with his meal plan.  He notes occasionally drinking a protein shake to increase his protein intake.  He notes decreased hunger signals and cravings.  He notes drinking more water and having more energy.  He had 1 low blood glucose at night, and is self managed.  He had a birthday and had a little bit of cake.  Decreased BF percentage, muscle mass.  Subjective:   1. Type 2 diabetes mellitus without complication, with long-term current use of insulin (HCC) [name]'s last A1c was 8.6.  He noted decreased use of NovoLog.  He had 1 low blood glucose at 3 AM, but no other.  He is scheduled to see his Endocrinologist.  His previous A1c was 14-10.  I discussed labs with the patient today.  2. Hypertension, essential [name]'s repeat blood pressure was 110/70.  No orthostasis or medication side effects.  Renal parameters recently were within normal limits.  I discussed labs with the patient today.  3. Other hyperlipidemia [name] has an elevated ASCVD risk score, and his last LDL was 134.  He is on Zetia, and had myopathy to Lipitor in the past but he is vitamin D deficient.  I discussed labs with the patient today.  4. Vitamin D deficiency [name] is currently taking prescription vitamin D 50,000 IU each week. He denies nausea, vomiting or muscle weakness. I discussed labs with the patient today.   Assessment/Plan:   1. Type 2  diabetes mellitus without complication, with long-term current use of insulin (HCC) [name] will continue his meal plan.  Monitor for recurrent lows to decrease insulin.  Decreased use of NovoLog noticed.   2. Hypertension, essential [name] continue with his weight loss therapy, and monitor for low blood pressures.  He will continue his current medications.  3. Other hyperlipidemia Replenished vitamin D for 3 months and challenge with low-dose rosuvastatin if his PCP agrees.  4. Vitamin D deficiency We will refill prescription vitamin D 50,000 units once weekly for 3 months.  We will recheck vitamin D level for goal of 50-60.  - Vitamin D, Ergocalciferol, (DRISDOL) 1.25 MG (50000 UNIT) CAPS capsule; Take 1 capsule (50,000 Units total) by mouth every 7 (seven) days.  Dispense: 13 capsule; Refill: 0  5. Obesity, current BMI 36.3 [name] is currently in the action stage of change. As such, his goal is to continue with weight loss efforts. He has agreed to the Category 3 Plan.   Exercise goals: As is.   Behavioral modification strategies: celebration eating strategies, avoiding temptations, and planning for success.  [name] has agreed to follow-up with our clinic in 2 weeks. He was informed of the importance of frequent follow-up visits to maximize his success with intensive lifestyle modifications for his multiple health conditions.   Objective:   Blood pressure 110/70, pulse 90, temperature 98 F (36.7 C), height 6\' 2"  (1.88 m), weight 283 lb (  128.4 kg), SpO2 96 %. Body mass index is 36.34 kg/m.  General: Cooperative, alert, well developed, in no acute distress. HEENT: Conjunctivae and lids unremarkable. Cardiovascular: Regular rhythm.  Lungs: Normal work of breathing. Neurologic: No focal deficits.   Lab Results  Component Value Date   CREATININE 0.90 02/21/2022   BUN 18 02/21/2022   NA 140 02/21/2022   K 4.4 02/21/2022   CL 105 02/21/2022   CO2 27 02/21/2022   Lab Results   Component Value Date   ALT 12 10/14/2021   AST 14 10/14/2021   ALKPHOS 107 10/14/2021   BILITOT 0.6 10/14/2021   Lab Results  Component Value Date   HGBA1C 8.6 (H) 04/11/2022   HGBA1C 9.5 (A) 07/15/2020   HGBA1C 10.0 (A) 12/24/2019   HGBA1C 14.2 (H) 08/05/2019   HGBA1C 10.0 (H) 01/18/2018   No results found for: "INSULIN" Lab Results  Component Value Date   TSH 1.760 04/11/2022   Lab Results  Component Value Date   CHOL 197 02/14/2022   HDL 44 02/14/2022   LDLCALC 134 (H) 02/14/2022   TRIG 106 02/14/2022   CHOLHDL 4.5 02/14/2022   Lab Results  Component Value Date   VD25OH 28.8 (L) 04/11/2022   VD25OH 26 (L) 04/01/2013   Lab Results  Component Value Date   WBC 10.6 (H) 02/21/2022   HGB 15.0 02/21/2022   HCT 44.2 02/21/2022   MCV 88.6 02/21/2022   PLT 291 02/21/2022   No results found for: "IRON", "TIBC", "FERRITIN"  Attestation Statements:   Reviewed by clinician on day of visit: allergies, medications, problem list, medical history, surgical history, family history, social history, and previous encounter notes.  Time spent on visit including pre-visit chart review and post-visit care and charting was 20 minutes.   Trude Mcburney, am acting as transcriptionist for Worthy Rancher, MD.  I have reviewed the above documentation for accuracy and completeness, and I agree with the above. -Worthy Rancher, MD

## 2022-05-09 ENCOUNTER — Ambulatory Visit (INDEPENDENT_AMBULATORY_CARE_PROVIDER_SITE_OTHER): Payer: Self-pay | Admitting: Internal Medicine

## 2022-05-09 ENCOUNTER — Encounter (INDEPENDENT_AMBULATORY_CARE_PROVIDER_SITE_OTHER): Payer: Self-pay | Admitting: Internal Medicine

## 2022-05-09 VITALS — BP 126/76 | HR 80 | Temp 97.6°F | Ht 74.0 in | Wt 281.0 lb

## 2022-05-09 DIAGNOSIS — E669 Obesity, unspecified: Secondary | ICD-10-CM

## 2022-05-09 DIAGNOSIS — Z6836 Body mass index (BMI) 36.0-36.9, adult: Secondary | ICD-10-CM

## 2022-05-09 DIAGNOSIS — Z794 Long term (current) use of insulin: Secondary | ICD-10-CM

## 2022-05-09 DIAGNOSIS — I10 Essential (primary) hypertension: Secondary | ICD-10-CM

## 2022-05-09 DIAGNOSIS — E119 Type 2 diabetes mellitus without complications: Secondary | ICD-10-CM

## 2022-05-17 NOTE — Progress Notes (Signed)
Chief Complaint:   OBESITY [name] is here to discuss his progress with his obesity treatment plan along with follow-up of his obesity related diagnoses. [name] is on the Category 3 Plan and states he is following his eating plan approximately 85% of the time. [name] states he is walking for 25-30 minutes 3 times per week.  Today's visit was #: 3 Starting weight: 286 lbs Starting date: 04/11/2022 Today's weight: 281 lbs Today's date: 05/09/2022 Total lbs lost to date: 5 Total lbs lost since last in-office visit: 3  Interim History: [name] is doing well and he reports good satiety and satiation. He is being mindful about snacking and craving. He is drinking protein shakes from [employer]. His daughter in-law is doing some meal preparation.   Subjective:   1. Type 2 diabetes mellitus without complication, with long-term current use of insulin (HCC) [name] is not using Novolog, and he denies lows. I reviewed Dexcom report he is not having lows.  70% of the time he is at goal.  2. Hypertension, essential [name]'s blood pressure is well controlled. He is on nebivolol, and he denies orthostasis.   Assessment/Plan:   1. Type 2 diabetes mellitus without complication, with long-term current use of insulin (HCC) [name] will continue with his weight loss therapy, and he will continue metformin, Trulicity, SGLT-2, and [name]. He is no longer using fast acting insulin. He was counseled on monitoring for low blood glucose with continued weight loss.  2. Hypertension, essential [name] will continue working on healthy weight loss and exercise to improve blood pressure control. We will watch for signs of hypotension as he continues his lifestyle modifications.  3. Obesity, current BMI 36.1 [name] is currently in the action stage of change. As such, his goal is to continue with weight loss efforts. He has agreed to the Category 3 Plan.  Exercise goals: As is.   Behavioral modification strategies:  increasing lean protein intake, no skipping meals, better snacking choices, emotional eating strategies, avoiding temptations, and planning for success.  [name] has agreed to follow-up with our clinic in 2 to 3 weeks. He was informed of the importance of frequent follow-up visits to maximize his success with intensive lifestyle modifications for his multiple health conditions.   Objective:   Blood pressure 126/76, pulse 80, temperature 97.6 F (36.4 C), height 6\' 2"  (1.88 m), weight 281 lb (127.5 kg). Body mass index is 36.08 kg/m.  General: Cooperative, alert, well developed, in no acute distress. HEENT: Conjunctivae and lids unremarkable. Cardiovascular: Regular rhythm.  Lungs: Normal work of breathing. Neurologic: No focal deficits.   Lab Results  Component Value Date   CREATININE 0.90 02/21/2022   BUN 18 02/21/2022   NA 140 02/21/2022   K 4.4 02/21/2022   CL 105 02/21/2022   CO2 27 02/21/2022   Lab Results  Component Value Date   ALT 12 10/14/2021   AST 14 10/14/2021   ALKPHOS 107 10/14/2021   BILITOT 0.6 10/14/2021   Lab Results  Component Value Date   HGBA1C 8.6 (H) 04/11/2022   HGBA1C 9.5 (A) 07/15/2020   HGBA1C 10.0 (A) 12/24/2019   HGBA1C 14.2 (H) 08/05/2019   HGBA1C 10.0 (H) 01/18/2018   No results found for: "INSULIN" Lab Results  Component Value Date   TSH 1.760 04/11/2022   Lab Results  Component Value Date   CHOL 197 02/14/2022   HDL 44 02/14/2022   LDLCALC 134 (H) 02/14/2022   TRIG 106 02/14/2022   CHOLHDL 4.5  02/14/2022   Lab Results  Component Value Date   VD25OH 28.8 (L) 04/11/2022   VD25OH 26 (L) 04/01/2013   Lab Results  Component Value Date   WBC 10.6 (H) 02/21/2022   HGB 15.0 02/21/2022   HCT 44.2 02/21/2022   MCV 88.6 02/21/2022   PLT 291 02/21/2022   No results found for: "IRON", "TIBC", "FERRITIN"  Attestation Statements:   Reviewed by clinician on day of visit: allergies, medications, problem list, medical history,  surgical history, family history, social history, and previous encounter notes.  Time spent on visit including pre-visit chart review and post-visit care and charting was 20 minutes.   Wilhemena Durie, am acting as transcriptionist for Thomes Dinning, MD.  I have reviewed the above documentation for accuracy and completeness, and I agree with the above. -Thomes Dinning, MD

## 2022-05-23 ENCOUNTER — Ambulatory Visit (INDEPENDENT_AMBULATORY_CARE_PROVIDER_SITE_OTHER): Payer: Self-pay | Admitting: Internal Medicine

## 2022-05-23 ENCOUNTER — Encounter (INDEPENDENT_AMBULATORY_CARE_PROVIDER_SITE_OTHER): Payer: Self-pay | Admitting: Internal Medicine

## 2022-05-23 VITALS — BP 134/79 | HR 84 | Temp 97.7°F | Ht 74.0 in | Wt 276.0 lb

## 2022-05-23 DIAGNOSIS — E559 Vitamin D deficiency, unspecified: Secondary | ICD-10-CM

## 2022-05-23 DIAGNOSIS — E669 Obesity, unspecified: Secondary | ICD-10-CM

## 2022-05-23 DIAGNOSIS — Z6835 Body mass index (BMI) 35.0-35.9, adult: Secondary | ICD-10-CM

## 2022-05-23 DIAGNOSIS — Z794 Long term (current) use of insulin: Secondary | ICD-10-CM

## 2022-05-23 DIAGNOSIS — E119 Type 2 diabetes mellitus without complications: Secondary | ICD-10-CM

## 2022-05-27 ENCOUNTER — Other Ambulatory Visit: Payer: Self-pay | Admitting: Family Medicine

## 2022-05-27 NOTE — Telephone Encounter (Signed)
Refill request for GABAPENTIN 100 MG CAP   LOV - 03/08/22 Next OV - not scheduled Last refill - 04/12/22 #270/1

## 2022-06-08 NOTE — Progress Notes (Signed)
Chief Complaint:   OBESITY [name] is here to discuss his progress with his obesity treatment plan along with follow-up of his obesity related diagnoses. [name] is on the Category 3 Plan and states he is following his eating plan approximately 95% of the time. [name] states he is walking for 30 minutes 4-5 times per week.  Today's visit was #: 4 Starting weight: 286 lbs Starting date: 04/11/2022 Today's weight: 276 lbs Today's date: 05/23/2022 Total lbs lost to date: 10 Total lbs lost since last in-office visit: 5  Interim History: [name] has some family emergencies and frequent travel to the hospital due to ill relatives.  He reports good adherence to the plan.  He notes adequate satiation and satiety, and decreased snacking.  He is drinking mostly water throughout the day.  His total weight loss since September is 27 pounds.  Subjective:   1. Type 2 diabetes mellitus without complication, with long-term current use of insulin (HCC) [name]'s 30-day average is 168, predicted A1c is 7.3.  He sees endocrinology in a week.  He denies lows.  His blood glucose is in range approximately 60 to 70% of the time.  I discussed labs with the patient today.  2. Vitamin D deficiency [name] is currently taking prescription vitamin D 50,000 IU each week. He denies nausea, vomiting or muscle weakness.  Assessment/Plan:   1. Type 2 diabetes mellitus without complication, with long-term current use of insulin (HCC) [name]'s levels have overall improved, but he still has some excursions.  He will continue with his weight loss therapy, Trulicity, SGLT-2, and metformin.  2. Vitamin D deficiency [name] will continue his vitamin D supplementation for 3 months.  We will recheck his vitamin D level for goal of 50-60.  3. Obesity, current BMI 35.5 [name] is currently in the action stage of change. As such, his goal is to continue with weight loss efforts. He has agreed to the Category 3 Plan.   Exercise goals: As  is.  Behavioral modification strategies: increasing lean protein intake, increasing water intake, no skipping meals, keeping healthy foods in the home, emotional eating strategies, holiday eating strategies , celebration eating strategies, and avoiding temptations.  [name] has agreed to follow-up with our clinic in 2 to 3 weeks. He was informed of the importance of frequent follow-up visits to maximize his success with intensive lifestyle modifications for his multiple health conditions.   Objective:   Blood pressure 134/79, pulse 84, temperature 97.7 F (36.5 C), height 6\' 2"  (1.88 m), weight 276 lb (125.2 kg), SpO2 99 %. Body mass index is 35.44 kg/m.  General: Cooperative, alert, well developed, in no acute distress. HEENT: Conjunctivae and lids unremarkable. Cardiovascular: Regular rhythm.  Lungs: Normal work of breathing. Neurologic: No focal deficits.   Lab Results  Component Value Date   CREATININE 0.90 02/21/2022   BUN 18 02/21/2022   NA 140 02/21/2022   K 4.4 02/21/2022   CL 105 02/21/2022   CO2 27 02/21/2022   Lab Results  Component Value Date   ALT 12 10/14/2021   AST 14 10/14/2021   ALKPHOS 107 10/14/2021   BILITOT 0.6 10/14/2021   Lab Results  Component Value Date   HGBA1C 8.6 (H) 04/11/2022   HGBA1C 9.5 (A) 07/15/2020   HGBA1C 10.0 (A) 12/24/2019   HGBA1C 14.2 (H) 08/05/2019   HGBA1C 10.0 (H) 01/18/2018   No results found for: "INSULIN" Lab Results  Component Value Date   TSH 1.760 04/11/2022   Lab Results  Component Value Date   CHOL 197 02/14/2022   HDL 44 02/14/2022   LDLCALC 134 (H) 02/14/2022   TRIG 106 02/14/2022   CHOLHDL 4.5 02/14/2022   Lab Results  Component Value Date   VD25OH 28.8 (L) 04/11/2022   VD25OH 26 (L) 04/01/2013   Lab Results  Component Value Date   WBC 10.6 (H) 02/21/2022   HGB 15.0 02/21/2022   HCT 44.2 02/21/2022   MCV 88.6 02/21/2022   PLT 291 02/21/2022   No results found for: "IRON", "TIBC",  "FERRITIN"  Attestation Statements:   Reviewed by clinician on day of visit: allergies, medications, problem list, medical history, surgical history, family history, social history, and previous encounter notes.  Time spent on visit including pre-visit chart review and post-visit care and charting was 20 minutes.   Trude Mcburney, am acting as transcriptionist for Worthy Rancher, MD.  I have reviewed the above documentation for accuracy and completeness, and I agree with the above. -Worthy Rancher, MD

## 2022-06-13 ENCOUNTER — Encounter (INDEPENDENT_AMBULATORY_CARE_PROVIDER_SITE_OTHER): Payer: Self-pay | Admitting: Internal Medicine

## 2022-06-13 ENCOUNTER — Ambulatory Visit (INDEPENDENT_AMBULATORY_CARE_PROVIDER_SITE_OTHER): Payer: BC Managed Care – PPO | Admitting: Internal Medicine

## 2022-06-13 VITALS — BP 92/57 | HR 91 | Temp 97.6°F | Ht 74.0 in | Wt 277.0 lb

## 2022-06-13 DIAGNOSIS — Z6835 Body mass index (BMI) 35.0-35.9, adult: Secondary | ICD-10-CM | POA: Diagnosis not present

## 2022-06-13 DIAGNOSIS — I10 Essential (primary) hypertension: Secondary | ICD-10-CM

## 2022-06-13 DIAGNOSIS — E119 Type 2 diabetes mellitus without complications: Secondary | ICD-10-CM

## 2022-06-13 DIAGNOSIS — E669 Obesity, unspecified: Secondary | ICD-10-CM | POA: Diagnosis not present

## 2022-06-13 DIAGNOSIS — Z6836 Body mass index (BMI) 36.0-36.9, adult: Secondary | ICD-10-CM

## 2022-06-13 DIAGNOSIS — Z794 Long term (current) use of insulin: Secondary | ICD-10-CM

## 2022-06-25 NOTE — Progress Notes (Signed)
Chief Complaint:   OBESITY [name] is here to discuss his progress with his obesity treatment plan along with follow-up of his obesity related diagnoses. [name] is on the Category 3 Plan and states he is following his eating plan approximately 90-95% of the time. [name] states he is not currently exercising.  Today's visit was #: 5 Starting weight: 286 lbs Starting date: 04/11/2022 Today's weight: 277 lbs Today's date: 06/13/2022 Total lbs lost to date: 9 Total lbs lost since last in-office visit: +1  Interim History: Pt with history of lap band. 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 He acknowledges some indulgence over Thanksgiving. He has not been able to get out to purchase groceries, has been using meal replacements at times.  Pt denies abnormal cravings or eating patterns.  He is on GLP-1 drug for his diabetes.  Subjective:   1. Essential hypertension BP low normal this morning. Pt is asymptomatic on Bystolic.  2. Type 2 diabetes mellitus without complication, with long-term current use of insulin (HCC) [name] is on CGM, GMI 7.1%, average 16.2. PCP will look into changing Trulicity to Ozempic.  Assessment/Plan:   1. Essential hypertension Monitor for orthostatics while losing weight. Continue weight loss therapy. Continue beta-blocker. Meds may need to be lowered if he becomes symptomatic.  2. Type 2 diabetes mellitus without complication, with long-term current use of insulin (HCC) Continue weight loss therapy. Continue Metformin, Trulicity, and Jardiance.  No low blood sugars  3. Obesity, current BMI 35.6 [name] is currently in the action stage of change. As such, his goal is to continue with weight loss efforts. He has agreed to the Category 3 Plan.   Exercise goals:  Increase physical activity to baseline (NEAT).  Behavioral modification strategies: increasing lean protein intake, decreasing simple carbohydrates, increasing water intake, no skipping  meals, and holiday eating strategies .  Kamonte has agreed to follow-up with our clinic in 2-3 weeks. He was informed of the importance of frequent follow-up visits to maximize his success with intensive lifestyle modifications for his multiple health conditions.   Objective:   Blood pressure (!) 92/57, pulse 91, temperature 97.6 F (36.4 C), height 6\' 2"  (1.88 m), weight 277 lb (125.6 kg), SpO2 99 %. Body mass index is 35.56 kg/m.  General: Cooperative, alert, well developed, in no acute distress. HEENT: Conjunctivae and lids unremarkable. Cardiovascular: Regular rhythm.  Lungs: Normal work of breathing. Neurologic: No focal deficits.   Lab Results  Component Value Date   CREATININE 0.90 02/21/2022   BUN 18 02/21/2022   NA 140 02/21/2022   K 4.4 02/21/2022   CL 105 02/21/2022   CO2 27 02/21/2022   Lab Results  Component Value Date   ALT 12 10/14/2021   AST 14 10/14/2021   ALKPHOS 107 10/14/2021   BILITOT 0.6 10/14/2021   Lab Results  Component Value Date   HGBA1C 8.6 (H) 04/11/2022   HGBA1C 9.5 (A) 07/15/2020   HGBA1C 10.0 (A) 12/24/2019   HGBA1C 14.2 (H) 08/05/2019   HGBA1C 10.0 (H) 01/18/2018   No results found for: "INSULIN" Lab Results  Component Value Date   TSH 1.760 04/11/2022   Lab Results  Component Value Date   CHOL 197 02/14/2022   HDL 44 02/14/2022   LDLCALC 134 (H) 02/14/2022   TRIG 106 02/14/2022   CHOLHDL 4.5 02/14/2022   Lab Results  Component Value Date   VD25OH 28.8 (L) 04/11/2022   VD25OH 26 (L) 04/01/2013  Lab Results  Component Value Date   WBC 10.6 (H) 02/21/2022   HGB 15.0 02/21/2022   HCT 44.2 02/21/2022   MCV 88.6 02/21/2022   PLT 291 02/21/2022   Attestation Statements:   Reviewed by clinician on day of visit: allergies, medications, problem list, medical history, surgical history, family history, social history, and previous encounter notes.  Time spent on visit including pre-visit chart review and post-visit care and  charting was 20 minutes.   I, Kyung Rudd, BS, CMA, am acting as transcriptionist for Worthy Rancher, MD.  I have reviewed the above documentation for accuracy and completeness, and I agree with the above. -Worthy Rancher, MD

## 2022-07-05 ENCOUNTER — Ambulatory Visit (INDEPENDENT_AMBULATORY_CARE_PROVIDER_SITE_OTHER): Payer: BC Managed Care – PPO | Admitting: Internal Medicine

## 2022-07-05 ENCOUNTER — Encounter (INDEPENDENT_AMBULATORY_CARE_PROVIDER_SITE_OTHER): Payer: Self-pay | Admitting: Internal Medicine

## 2022-07-05 ENCOUNTER — Other Ambulatory Visit: Payer: Self-pay | Admitting: Family Medicine

## 2022-07-05 VITALS — BP 123/74 | HR 78 | Temp 97.8°F | Ht 74.0 in | Wt 275.0 lb

## 2022-07-05 DIAGNOSIS — E1169 Type 2 diabetes mellitus with other specified complication: Secondary | ICD-10-CM

## 2022-07-05 DIAGNOSIS — Z6835 Body mass index (BMI) 35.0-35.9, adult: Secondary | ICD-10-CM

## 2022-07-05 DIAGNOSIS — I10 Essential (primary) hypertension: Secondary | ICD-10-CM

## 2022-07-05 DIAGNOSIS — E669 Obesity, unspecified: Secondary | ICD-10-CM | POA: Diagnosis not present

## 2022-07-05 DIAGNOSIS — Z7984 Long term (current) use of oral hypoglycemic drugs: Secondary | ICD-10-CM

## 2022-07-05 DIAGNOSIS — E559 Vitamin D deficiency, unspecified: Secondary | ICD-10-CM | POA: Diagnosis not present

## 2022-07-05 DIAGNOSIS — Z7985 Long-term (current) use of injectable non-insulin antidiabetic drugs: Secondary | ICD-10-CM

## 2022-07-05 NOTE — Progress Notes (Signed)
Chief Complaint:   OBESITY [name] is here to discuss his progress with his obesity treatment plan along with follow-up of his obesity related diagnoses. [name] is on the Category 3 Plan and states he is following his eating plan approximately 95% of the time. [name] states he is doing 0 minutes 0 times per week.  Today's visit was #: 6 Starting weight: 286 lbs Starting date: 04/11/2022 Today's weight: 275 lbs Today's date: 07/05/2022 Total lbs lost to date: 11 Total lbs lost since last in-office visit: 2  Interim History: [name] presents today for a follow-up.  He reports good adherence to meal plan and denies any challenges.  He does note that he has a hard time with vegetables and fruits because of the restrictive nature of Lap-Band.  He is able to eat fruits without the peel.  He denies any pain with swallowing.  He recently had dental work and is currently on a soft diet.  He reports adequate satiety and satiation and denies abnormal cravings or eating patterns.  He denies liquid calories, but has been drinking sugar-free Kool-Aid and water mostly.  He is at times using protein shakes or healthy snack options in between his meals.  He has not been physically active.  Peak weight 303 today's weight 275 for total weight loss of 28 pounds in 3 months.  Subjective:   1. Type 2 diabetes mellitus with other specified complication, unspecified whether long term insulin use (HCC) A1c reported at 7.2. on Trulicity, Jardiance, and metformin. No hypoglycemia reported.   2. Essential hypertension Repeat blood pressure 123/74, well controlled on Bystolic.   3. Vitamin D deficiency He is currently taking prescription vitamin D 50,000 IU each week. He denies nausea, vomiting or muscle weakness.  Assessment/Plan:   1. Type 2 diabetes mellitus with other specified complication, unspecified whether long term insulin use (HCC) Continue weight loss therapy and regimen.   2. Essential  hypertension Continue weight loss therapy, and monitor for orthostasis while losing weight. Continue Bystolic.   3. Vitamin D deficiency Low Vitamin D level contributes to fatigue and are associated with obesity, breast, and colon cancer. He agrees to continue to take prescription Vitamin D 50,000 IU every week and will follow-up for routine testing of Vitamin D, at least 2-3 times per year to avoid over-replacement.  4. Obesity,current BMI 35.4 [name] is currently in the action stage of change. As such, his goal is to continue with weight loss efforts. He has agreed to the Category 2 Plan.   Exercise goals: No exercise has been prescribed at this time.  Behavioral modification strategies: increasing lean protein intake, increasing vegetables, better snacking choices, and planning for success.  [name] has agreed to follow-up with our clinic in 3 weeks. He was informed of the importance of frequent follow-up visits to maximize his success with intensive lifestyle modifications for his multiple health conditions.   Objective:   Blood pressure 123/74, pulse 78, temperature 97.8 F (36.6 C), height 6\' 2"  (1.88 m), weight 275 lb (124.7 kg), SpO2 99 %. Body mass index is 35.31 kg/m.  General: Cooperative, alert, well developed, in no acute distress. HEENT: Conjunctivae and lids unremarkable. Cardiovascular: Regular rhythm.  Lungs: Normal work of breathing. Neurologic: No focal deficits.   Lab Results  Component Value Date   CREATININE 0.90 02/21/2022   BUN 18 02/21/2022   NA 140 02/21/2022   K 4.4 02/21/2022   CL 105 02/21/2022   CO2 27 02/21/2022   Lab  Results  Component Value Date   ALT 12 10/14/2021   AST 14 10/14/2021   ALKPHOS 107 10/14/2021   BILITOT 0.6 10/14/2021   Lab Results  Component Value Date   HGBA1C 8.6 (H) 04/11/2022   HGBA1C 9.5 (A) 07/15/2020   HGBA1C 10.0 (A) 12/24/2019   HGBA1C 14.2 (H) 08/05/2019   HGBA1C 10.0 (H) 01/18/2018   No results found for:  "INSULIN" Lab Results  Component Value Date   TSH 1.760 04/11/2022   Lab Results  Component Value Date   CHOL 197 02/14/2022   HDL 44 02/14/2022   LDLCALC 134 (H) 02/14/2022   TRIG 106 02/14/2022   CHOLHDL 4.5 02/14/2022   Lab Results  Component Value Date   VD25OH 28.8 (L) 04/11/2022   VD25OH 26 (L) 04/01/2013   Lab Results  Component Value Date   WBC 10.6 (H) 02/21/2022   HGB 15.0 02/21/2022   HCT 44.2 02/21/2022   MCV 88.6 02/21/2022   PLT 291 02/21/2022   No results found for: "IRON", "TIBC", "FERRITIN"  Attestation Statements:   Reviewed by clinician on day of visit: allergies, medications, problem list, medical history, surgical history, family history, social history, and previous encounter notes.  Time spent on visit including pre-visit chart review and post-visit care and charting was 20 minutes.   Trude Mcburney, am acting as transcriptionist for Worthy Rancher, MD.  I have reviewed the above documentation for accuracy and completeness, and I agree with the above. -Worthy Rancher, MD

## 2022-07-19 ENCOUNTER — Encounter (INDEPENDENT_AMBULATORY_CARE_PROVIDER_SITE_OTHER): Payer: Self-pay | Admitting: Internal Medicine

## 2022-07-19 ENCOUNTER — Ambulatory Visit (INDEPENDENT_AMBULATORY_CARE_PROVIDER_SITE_OTHER): Payer: BC Managed Care – PPO | Admitting: Internal Medicine

## 2022-07-19 VITALS — BP 138/83 | HR 77 | Temp 97.7°F | Ht 74.0 in | Wt 277.0 lb

## 2022-07-19 DIAGNOSIS — Z6835 Body mass index (BMI) 35.0-35.9, adult: Secondary | ICD-10-CM

## 2022-07-19 DIAGNOSIS — Z7985 Long-term (current) use of injectable non-insulin antidiabetic drugs: Secondary | ICD-10-CM

## 2022-07-19 DIAGNOSIS — E669 Obesity, unspecified: Secondary | ICD-10-CM | POA: Diagnosis not present

## 2022-07-19 DIAGNOSIS — E1169 Type 2 diabetes mellitus with other specified complication: Secondary | ICD-10-CM

## 2022-07-19 DIAGNOSIS — Z794 Long term (current) use of insulin: Secondary | ICD-10-CM | POA: Diagnosis not present

## 2022-07-19 DIAGNOSIS — Z7984 Long term (current) use of oral hypoglycemic drugs: Secondary | ICD-10-CM

## 2022-07-19 DIAGNOSIS — Z9884 Bariatric surgery status: Secondary | ICD-10-CM

## 2022-07-19 NOTE — Progress Notes (Signed)
Chief Complaint:   OBESITY [name] is here to discuss his progress with his obesity treatment plan along with follow-up of his obesity related diagnoses. [name] is on the Category 2 Plan and states he is following his eating plan approximately 90+% of the time. [name] states he is walking for 25 minutes 4-5 times per week.  Today's visit was #: 7 Starting weight: 286 lbs Starting date: 04/11/2022 Today's weight: 277 lbs Today's date: 07/19/2022 Total lbs lost to date: 9 Total lbs lost since last in-office visit: 0  Interim History: [name] presents today for a follow-up.  Since last office visit he has gained 2 pounds.  He reports good adherence to prescribed reduced calorie nutritional plan.  He notes adequate satiety and satiation.  He denies abnormal cravings or eating patterns.  He denies intake of liquid calories.  He notes increase stress level as his mother is in hospice and wife is disabled.  He also reports that his insurance does not cover her visits here at the weight management center so he will not be able to continue.  Subjective:   1. LAP-BAND surgery status Because of restrictive nature this affects his ability to eat certain fruits and vegetables.  He has been relying on protein drinks to supplement his nutrition and increase protein.   2. Type 2 diabetes mellitus with other specified complication, with long-term current use of insulin (HCC) His most recent A1c is down to 7.4 and improved.  He is on Tresiba, Jardiance, metformin and Trulicity.  He denies hypoglycemia or adverse effects.  Assessment/Plan:   1. LAP-BAND surgery status Patient advised that if he continues to experience worsening restriction particularly around solids this may warrant further investigation with an EGD and possible follow-up with bariatric surgeon.  2. Type 2 diabetes mellitus with other specified complication, with long-term current use of insulin (HCC) He will continue to work on nutritional  therapy.  We discussed the benefits of possibly switching to Ozempic if covered by his insurance due to it's hypothalamic affinity and effects on hunger signals and appetite suppression.  This is in addition to cardiovascular protection and glycemic control.  He will discuss this with his endocrinologist.  3. Obesity, current BMI 35.6 Per patient because of insurance he cannot afford to come to our clinic.  He will continue to work on the 3 pillars of weight loss focused on nutrition, behavioral and physical activity.  He will continue to journal and track his weight.  He was also guided on the use of an application to track calories with a goal of 1200 cal and 80 to 90 g of protein daily.  [name] is currently in the action stage of change. As such, his goal is to continue with weight loss efforts. He has agreed to the Category 2 Plan.   Exercise goals: As is.   Behavioral modification strategies: increasing lean protein intake, no skipping meals, meal planning and cooking strategies, keeping healthy foods in the home, avoiding temptations, planning for success, and keeping a strict food journal.  [name] has agreed to follow-up with our clinic as needed. He was informed of the importance of frequent follow-up visits to maximize his success with intensive lifestyle modifications for his multiple health conditions.   Objective:   Blood pressure 138/83, pulse 77, temperature 97.7 F (36.5 C), height 6\' 2"  (1.88 m), weight 277 lb (125.6 kg), SpO2 100 %. Body mass index is 35.56 kg/m.  General: Cooperative, alert, well developed, in no acute  distress. HEENT: Conjunctivae and lids unremarkable. Cardiovascular: Regular rhythm.  Lungs: Normal work of breathing. Neurologic: No focal deficits.   Lab Results  Component Value Date   CREATININE 0.90 02/21/2022   BUN 18 02/21/2022   NA 140 02/21/2022   K 4.4 02/21/2022   CL 105 02/21/2022   CO2 27 02/21/2022   Lab Results  Component Value Date    ALT 12 10/14/2021   AST 14 10/14/2021   ALKPHOS 107 10/14/2021   BILITOT 0.6 10/14/2021   Lab Results  Component Value Date   HGBA1C 8.6 (H) 04/11/2022   HGBA1C 9.5 (A) 07/15/2020   HGBA1C 10.0 (A) 12/24/2019   HGBA1C 14.2 (H) 08/05/2019   HGBA1C 10.0 (H) 01/18/2018   No results found for: "INSULIN" Lab Results  Component Value Date   TSH 1.760 04/11/2022   Lab Results  Component Value Date   CHOL 197 02/14/2022   HDL 44 02/14/2022   LDLCALC 134 (H) 02/14/2022   TRIG 106 02/14/2022   CHOLHDL 4.5 02/14/2022   Lab Results  Component Value Date   VD25OH 28.8 (L) 04/11/2022   VD25OH 26 (L) 04/01/2013   Lab Results  Component Value Date   WBC 10.6 (H) 02/21/2022   HGB 15.0 02/21/2022   HCT 44.2 02/21/2022   MCV 88.6 02/21/2022   PLT 291 02/21/2022   No results found for: "IRON", "TIBC", "FERRITIN"  Attestation Statements:   Reviewed by clinician on day of visit: allergies, medications, problem list, medical history, surgical history, family history, social history, and previous encounter notes.   Wilhemena Durie, am acting as transcriptionist for Thomes Dinning, MD.  I have reviewed the above documentation for accuracy and completeness, and I agree with the above. -Thomes Dinning, MD

## 2022-09-13 ENCOUNTER — Telehealth: Payer: Self-pay | Admitting: *Deleted

## 2022-09-13 ENCOUNTER — Encounter: Payer: Self-pay | Admitting: Nurse Practitioner

## 2022-09-13 ENCOUNTER — Ambulatory Visit: Payer: BC Managed Care – PPO | Admitting: Nurse Practitioner

## 2022-09-13 VITALS — BP 136/72 | HR 82 | Temp 98.7°F | Resp 16 | Ht 74.0 in | Wt 294.2 lb

## 2022-09-13 DIAGNOSIS — H6021 Malignant otitis externa, right ear: Secondary | ICD-10-CM | POA: Diagnosis not present

## 2022-09-13 MED ORDER — AMOXICILLIN-POT CLAVULANATE 875-125 MG PO TABS: 1.0000 | ORAL_TABLET | Freq: Two times a day (BID) | ORAL | 0 refills | Status: DC

## 2022-09-13 NOTE — Telephone Encounter (Signed)
PLEASE NOTE: All timestamps contained within this report are represented as [place] Standard Time. CONFIDENTIALTY NOTICE: This fax transmission is intended only for the addressee. It contains information that is legally privileged, confidential or otherwise protected from use or disclosure. If you are not the intended recipient, you are strictly prohibited from reviewing, disclosing, copying using or disseminating any of this information or taking any action in reliance on or regarding this information. If you have received this fax in error, please notify us immediately by telephone so that we can arrange for its return to [name]. Phone: [phone], Toll-Free: [phone], Fax: [phone] Page: 1 of 1 Call Id: [id number] [place] RECORD [facility] Patient Name: [name] NS Gender: Male DOB: Aug 14, 1960 Age: 22 Y 71 M Return Phone Number: [phone] (Primary), [phone] (Secondary) Address: [place] [address] Client [facility] - Client Client Site [place] Provider Elsie Stain "Brigitte Pulse MD Contact Type Call Who Is Calling Patient / Member / Family / Caregiver Call Type Triage / Clinical Caller Name Snowden Fafard Relationship To Patient Spouse Return Phone Number [phone] (Primary) Chief Complaint Ear Discharge Reason for Call Symptomatic / Request for [place] states her husband has ear pain with discharge . Translation No Nurse Assessment Nurse: Kirk Ruths, RN, [facility] Date/Time (Eastern Time): 09/13/2022 9:00:38 AM Confirm and document reason for call. If symptomatic, describe symptoms. ---Caller states her husband is at the office right now he got an appt while waiting for call back no need for triage Does the patient have any new or worsening symptoms? ---No Disp. Time Eilene Ghazi Time) Disposition  Final User 09/13/2022 9:01:47 AM Clinical Call Yes Kirk Ruths, RN, [facility] Final Disposition 09/13/2022 9:01:47 AM Clinical Call Yes Kirk Ruths, RN, [facility]

## 2022-09-13 NOTE — Assessment & Plan Note (Signed)
Given patient's presentation and comorbidities concerning for the beginnings of a malignant otitis externa.  Will treat patient with Augmentin 875-125 mg twice daily for 7 days.  Strict precautions reviewed with patient when to return

## 2022-09-13 NOTE — Telephone Encounter (Signed)
Patient seen today 09/13/22 by Romilda Garret NP.

## 2022-09-13 NOTE — Patient Instructions (Signed)
Nice to see you today I am going to treat you with oral antibiotics considered it is coming on the outside of the ear. If you are not improving in the next 2 days I need to know and you need to be seen

## 2022-09-13 NOTE — Progress Notes (Signed)
Acute Office Visit  Subjective:     Patient ID: [name], male    DOB: 29-May-1961, 62 y.o.   MRN: [id number]  Chief Complaint  Patient presents with   Ear Pain    Right X couple days last night it started draining.    HPI Patient is in today for right ear pain with a history of htn, cad, chf, dm2, hearing loss  Symptoms started approx 3-4 days ago States that he started getting a lot of pressure on that side and fetl swollen.  States that he could not lay on it. States that he could feel it draining.  Pain is intermeittent.  States that it is sharp with pressure and tender to touch No submerging his head in water. History of ear infections, albeit remote   States that he was seen by the eye doctor yesterday and was dx with pink eye. Started on a drop (ofloxacin).  He does have a stent and has a follow-up next week with the eye professional.   Review of Systems  Constitutional:  Negative for chills, fever and malaise/fatigue.  HENT:  Positive for ear discharge and ear pain. Negative for sinus pain and sore throat.   Respiratory:  Positive for cough.   Neurological:  Negative for headaches.        Objective:    BP 136/72   Pulse 82   Temp 98.7 F (37.1 C)   Resp 16   Ht '6\' 2"'$  (1.88 m)   Wt 294 lb 4 oz (133.5 kg)   SpO2 99%   BMI 37.78 kg/m    Physical Exam Vitals and nursing note reviewed.  Constitutional:      Appearance: Normal appearance.  HENT:     Right Ear: Decreased hearing noted. Drainage, swelling and tenderness present.     Left Ear: Tympanic membrane, ear canal and external ear normal.     Ears:     Comments: Periauricular lymphadenopathy on the right side  Edema and erythema extending out of the canal to the auricle and tragus  Cardiovascular:     Rate and Rhythm: Normal rate and regular rhythm.     Heart sounds: Normal heart sounds.  Pulmonary:     Effort: Pulmonary effort is normal.     Breath sounds: Normal breath sounds.   Lymphadenopathy:     Cervical: No cervical adenopathy.  Neurological:     Mental Status: He is alert.     No results found for any visits on 09/13/22.      Assessment & Plan:   Problem List Items Addressed This Visit       Nervous and Auditory   Malignant otitis externa of right ear - Primary    Given patient's presentation and comorbidities concerning for the beginnings of a malignant otitis externa.  Will treat patient with Augmentin 875-125 mg twice daily for 7 days.  Strict precautions reviewed with patient when to return      Relevant Medications   amoxicillin-clavulanate (AUGMENTIN) 875-125 MG tablet    Meds ordered this encounter  Medications   amoxicillin-clavulanate (AUGMENTIN) 875-125 MG tablet    Sig: Take 1 tablet by mouth 2 (two) times daily for 7 days.    Dispense:  14 tablet    Refill:  0    Order Specific Question:   Supervising Provider    Answer:   TOWER, MARNE A [[id number]    Return if symptoms worsen or fail to improve.  Romilda Garret, NP

## 2022-09-14 NOTE — Telephone Encounter (Signed)
Noted. Thanks.

## 2022-09-15 ENCOUNTER — Encounter: Payer: Self-pay | Admitting: Nurse Practitioner

## 2022-09-15 MED ORDER — CIPROFLOXACIN-DEXAMETHASONE 0.3-0.1 % OT SUSP: 4.0000 [drp] | Freq: Two times a day (BID) | OTIC | 0 refills | Status: DC

## 2022-09-17 ENCOUNTER — Ambulatory Visit: Payer: BC Managed Care – PPO

## 2022-09-18 ENCOUNTER — Ambulatory Visit
Admission: EM | Admit: 2022-09-18 | Discharge: 2022-09-18 | Disposition: A | Discharge status: Home / Self Care | Payer: BC Managed Care – PPO | Attending: Emergency Medicine | Admitting: Emergency Medicine

## 2022-09-18 DIAGNOSIS — H60391 Other infective otitis externa, right ear: Secondary | ICD-10-CM

## 2022-09-18 MED ORDER — CEFDINIR 300 MG PO CAPS: 300.0000 mg | ORAL_CAPSULE | Freq: Two times a day (BID) | ORAL | 0 refills | Status: AC

## 2022-09-18 NOTE — ED Provider Notes (Signed)
[name]    CSN: [id number] Arrival date & time: 09/18/22  0913      History   Chief Complaint Chief Complaint  Patient presents with   Otalgia    HPI [name] is a 62 y.o. male.  Patient presents with ongoing 1 week history of right ear pain and drainage.  He was seen by his PCP on 09/13/2022; diagnosed with right otitis externa; treated with Augmentin which he completed yesterday.  His PCP started him on Ciprodex ear drops on 09/15/2022 which he is still using.  He denies fever, chills, rash, sore throat, cough, shortness of breath, or other symptoms.   .    The history is provided by the patient and medical records.    Past Medical History:  Diagnosis Date   Allergic rhinitis, cause unspecified    Anxiety    Chronic diastolic CHF (congestive heart failure) ([place])    a. 03/2017 Echo: EF 55%, Gr1DD, nl RV fxn.   Degeneration of intervertebral disc, site unspecified    Diabetes mellitus    Hyperlipidemia    Hypertension    Impotence of organic origin    Joint pain    Nonobstructive CAD    a. 03/2014 Cath: LM nl, LAD nl, D1 30ost/p, LCX nl, RCA 30d, EF 60%; b. 03/2017 MV: EF 56%, small, moderate basal inf/mid inf defect->prob inf thinning vs mild ischemia-->low-risk; c. 08/2021 Cath: LM nl, LAD 20ost/p, LCX large, 33m RCA large, 30d, EF 50-55%. Nl filling pressures-->Med rx.   Obesity    Obesity, unspecified    Obstructive sleep apnea (adult) (pediatric)    Other chronic allergic conjunctivitis    Personal history of colonic polyps    SOB (shortness of breath)     Patient Active Problem List   Diagnosis Date Noted   Malignant otitis externa of right ear 09/13/2022   Type 2 diabetes mellitus without complication, with long-term current use of insulin ([facility] 04/25/2022   Hypertension, essential 04/25/2022   Other hyperlipidemia 04/25/2022   Class 2 obesity without serious comorbidity with body mass index (BMI) of 36.0 to 36.9 in adult 04/25/2022   SOBOE  (shortness of breath on exertion) 04/11/2022   Other fatigue 04/11/2022   Vitamin D deficiency 04/11/2022   Essential hypertension 04/11/2022   Class 2 severe obesity with serious comorbidity and body mass index (BMI) of 36.0 to 36.9 in adult ([facility] 04/11/2022   Depression screening 04/11/2022   Physical deconditioning 03/18/2022   Nausea 03/09/2022   Arthralgia 10/17/2021   Dyspnea on exertion    Precordial pain    Sinus pressure 02/28/2021   Cough 07/17/2020   Sciatica 01/19/2020   Routine general medical examination at a health care facility 08/14/2019   Encounter for general adult medical examination with abnormal findings 01/24/2018   Advance care planning 01/24/2018   Hyperlipidemia 01/24/2018   Chronic diastolic CHF (congestive heart failure) ([place] 04/17/2014   CAD (coronary artery disease) 04/17/2014   Abnormal finding on cardiovascular stress test 03/20/2014   OSA (obstructive sleep apnea) 03/20/2014   DOE (dyspnea on exertion) 03/20/2014   Family history of coronary artery disease in father 03/20/2014   Hearing loss 09/03/2012   Other social stressor 09/03/2012   Diabetes mellitus ([facility] 06/05/2012   Hx of laparoscopic gastric banding 07/28/2011   Dyslipidemia 11/30/2007   Obesity (BMI 30-39.9) 11/30/2007   HTN (hypertension) 11/30/2007   GASTROPARESIS 11/30/2007    Past Surgical History:  Procedure Laterality Date   admission  01/2009  [place].  Syncopal event with nausea, diaphoresis, hypertension.  D-dimer elevated at 2.54 with negative CT chest. and VQ scan.  Cardiac enzymes negative x 3.  Labs normal.  Cardiology consult---cardiolite abn; cath, 2D-echo, holter monitor unrevealing.  SE H&V.   Allergy Consult  07/11/2010   SOB, itching, facial tingling, hypotension.  Allergy testing: no food allergies; +reaction to tree pollen, grass pollen, dust mites, mold.  Avoid diclofenac and similar products.  [name].   APPENDECTOMY     arthroscopy of knee  1980   BACK  SURGERY     x3   CATARACT EXTRACTION Left    COLONOSCOPY  07/11/2009   single polyp; repeat in 5 years.  [name].   GI consult  07/11/2010   CT chest in ED (gastric thickening).  [name].  No EGD warranted due to lack of symptoms.   LAPAROSCOPIC GASTRIC BANDING  09/15/09   LEFT HEART CATHETERIZATION WITH CORONARY ANGIOGRAM N/A 04/02/2014   Procedure: LEFT HEART CATHETERIZATION WITH CORONARY ANGIOGRAM;  Surgeon: [name], MD;  Location: [facility] CATH LAB;  Service: Cardiovascular;  Laterality: N/A;   QUADRICEPS REPAIR     left   QUADRICEPS TENDON REPAIR     left   RETINAL DETACHMENT SURGERY     RIGHT/LEFT HEART CATH AND CORONARY ANGIOGRAPHY N/A 08/13/2021   Procedure: RIGHT/LEFT HEART CATH AND CORONARY ANGIOGRAPHY;  Surgeon: Burnell Blanks, MD;  Location: [facility] CV LAB;  Service: Cardiovascular;  Laterality: N/A;   ROTATOR CUFF REPAIR     right   Sleep study  07/11/2010   repeat sleep study due to weight loss.  Severe OSA; CPAP titration to 8 cm.     [place] AND ADENOIDECTOMY  1960       Home Medications    Prior to Admission medications   Medication Sig Start Date End Date Taking? Authorizing Provider  cefdinir (OMNICEF) 300 MG capsule Take 1 capsule (300 mg total) by mouth 2 (two) times daily for 7 days. 09/18/22 09/25/22 Yes Sharion Balloon, NP  albuterol (VENTOLIN HFA) 108 (90 Base) MCG/ACT inhaler Inhale 1-2 puffs into the lungs every 6 (six) hours as needed for wheezing or shortness of breath. 07/19/21   Tonia Ghent, MD  aspirin EC 81 MG tablet Take 81 mg by mouth daily.    [provider]  ciprofloxacin-dexamethasone (CIPRODEX) OTIC suspension Place 4 drops into the right ear 2 (two) times daily. Patient not taking: Reported on 09/18/2022 09/15/22   Michela Pitcher, NP  Continuous Blood Gluc Receiver ([place]) [place] by Does not apply route.    [provider]  Continuous Blood Gluc Sensor ([place]) MISC by Does not  apply route.    [provider]  cyclobenzaprine (FLEXERIL) 5 MG tablet TAKE 1 TABLET BY MOUTH EVERY NIGHT AT BEDTIME 07/05/22   Tonia Ghent, MD  Dulaglutide (TRULICITY) 4.5 0000000 SOPN Inject 4.5 mg into the skin once a week.    [provider]  ezetimibe (ZETIA) 10 MG tablet Take 1 tablet (10 mg total) by mouth daily. 02/14/22   Burnell Blanks, MD  FLUoxetine (PROZAC) 20 MG tablet TAKE 1 TABLET BY MOUTH ONCE A DAY 11/10/21   Tonia Ghent, MD  gabapentin (NEURONTIN) 100 MG capsule TAKE 1 TO 3 CAPSULES BY MOUTH 3 TIMES DAILY AS NEEDED 05/29/22   Tonia Ghent, MD  insulin aspart (NOVOLOG FLEXPEN) 100 UNIT/ML FlexPen Per sliding scale 02/26/21  Tonia Ghent, MD  insulin degludec (TRESIBA) 200 UNIT/ML FlexTouch Pen Inject 32 Units into the skin every evening. 10/14/21   Tonia Ghent, MD  JARDIANCE 25 MG TABS tablet TAKE 1 TABLET BY MOUTH ONCE A DAY 11/23/20   Philemon Kingdom, MD  metFORMIN (GLUCOPHAGE) 1000 MG tablet TAKE 1 TABLET (1,000 MG TOTAL) BY MOUTH 2 (TWO) TIMES DAILY WITH A MEAL. 10/28/19   Philemon Kingdom, MD  Multiple Vitamin (MULTIVITAMIN) capsule Take 1 capsule by mouth daily.    [provider]  nebivolol (BYSTOLIC) 5 MG tablet Take 1 tablet (5 mg total) by mouth daily. 08/06/21   Theora Gianotti, NP  nitroGLYCERIN (NITROSTAT) 0.4 MG SL tablet Place 1 tablet (0.4 mg total) under the tongue every 5 (five) minutes as needed for chest pain. 08/06/21 04/11/22  Theora Gianotti, NP  ondansetron (ZOFRAN) 4 MG tablet Take 1 tablet (4 mg total) by mouth every 6 (six) hours. 02/21/22   Prosperi, Christian H, PA-C  Vitamin D, Ergocalciferol, (DRISDOL) 1.25 MG (50000 UNIT) CAPS capsule Take 1 capsule (50,000 Units total) by mouth every 7 (seven) days. 04/25/22   Thomes Dinning, MD    Family History Family History  Problem Relation Age of Onset   Cancer Father        skin   Heart disease Father 10       AMI first age 42;  stents.   Diabetes Father    Hypertension Father    Hyperlipidemia Father    Peripheral Artery Disease Father    Parkinson's disease Father    Cancer Paternal Grandfather        skin   Diabetes Mother    Arthritis Mother        total hip replacement   Kidney failure Mother    Depression Sister    Diabetes Sister    Cancer Maternal Uncle        lymphoma   Prostate cancer Maternal Uncle    Colon cancer Neg Hx     Social History Social History   Tobacco Use   Smoking status: Former    Packs/day: 0.25    Years: 2.00    Total pack years: 0.50    Types: Cigarettes   Smokeless tobacco: Never  Substance Use Topics   Alcohol use: Yes    Alcohol/week: 0.0 standard drinks of alcohol    Comment: occasional Beer once every two weeks on the weekends   Drug use: No     Allergies   Diclofenac sodium, Voltaren [diclofenac sodium], Lipitor [atorvastatin], Metoprolol, and Ramipril   Review of Systems Review of Systems  Constitutional:  Negative for chills and fever.  HENT:  Positive for ear discharge and ear pain. Negative for sore throat.   Respiratory:  Negative for cough and shortness of breath.   Cardiovascular:  Negative for chest pain and palpitations.  Gastrointestinal:  Negative for diarrhea and vomiting.  Skin:  Negative for color change and rash.  All other systems reviewed and are negative.    Physical Exam Triage Vital Signs ED Triage Vitals  Enc Vitals Group     BP      Pulse      Resp      Temp      Temp src      SpO2      Weight      Height      Head Circumference      Peak Flow      Pain  Score      Pain Loc      Pain Edu?      Excl. in [place]?    No data found.  Updated Vital Signs BP 136/88   Pulse 81   Temp 98.3 F (36.8 C)   Resp 18   SpO2 97%   Visual Acuity Right Eye Distance:   Left Eye Distance:   Bilateral Distance:    Right Eye Near:   Left Eye Near:    Bilateral Near:     Physical Exam Vitals and nursing note reviewed.   Constitutional:      General: He is not in acute distress.    Appearance: Normal appearance. He is well-developed. He is not ill-appearing.  HENT:     Right Ear: Drainage, swelling and tenderness present.     Left Ear: Tympanic membrane and ear canal normal.     Ears:     Comments: Unable to visualize right TM due to purulent drainage and edema of right ear canal.     Nose: Nose normal.     Mouth/Throat:     Mouth: Mucous membranes are moist.     Pharynx: Oropharynx is clear.  Cardiovascular:     Rate and Rhythm: Normal rate and regular rhythm.     Heart sounds: Normal heart sounds.  Pulmonary:     Effort: Pulmonary effort is normal. No respiratory distress.     Breath sounds: Normal breath sounds.  Musculoskeletal:     Cervical back: Neck supple.  Skin:    General: Skin is warm and dry.  Neurological:     Mental Status: He is alert.  Psychiatric:        Mood and Affect: Mood normal.        Behavior: Behavior normal.      UC Treatments / Results  Labs (all labs ordered are listed, but only abnormal results are displayed) Labs Reviewed - No data to display  EKG   Radiology No results found.  Procedures Procedures (including critical care time)  Medications Ordered in UC Medications - No data to display  Initial Impression / Assessment and Plan / UC Course  I have reviewed the triage vital signs and the nursing notes.  Pertinent labs & imaging results that were available during my care of the patient were reviewed by me and considered in my medical decision making (see chart for details).   Infective otitis externa of right ear.  Treating with oral cefdinir.  Instructed patient to continue taking using the Ciprodex eardrops as directed.  Discussed Tylenol or ibuprofen as needed for discomfort.  Instructed him to follow-up with his PCP or an ENT if his symptoms are not improving.  Education provided on otitis externa.  He agrees to plan of care.   Final Clinical  Impressions(s) / UC Diagnoses   Final diagnoses:  Infective otitis externa of right ear     Discharge Instructions      Continue using the ear drops as directed.    Take the cefdinir as directed.    Follow up with your primary care provider or an ENT if your symptoms are not improving.        ED Prescriptions     Medication Sig Dispense Auth. Provider   cefdinir (OMNICEF) 300 MG capsule Take 1 capsule (300 mg total) by mouth 2 (two) times daily for 7 days. 14 capsule Sharion Balloon, NP      PDMP not reviewed this encounter.  Sharion Balloon, NP 09/18/22 1011

## 2022-09-18 NOTE — Discharge Instructions (Addendum)
Continue using the ear drops as directed.    Take the cefdinir as directed.    Follow up with your primary care provider or an ENT if your symptoms are not improving.

## 2022-09-18 NOTE — ED Triage Notes (Signed)
Patient to Urgent Care with complaints of right sided ear pain. Some drainage last night. Symptoms started last weekend.  Patient was seen by pcp on Monday and prescribed ear drops/ amoxicillin. States he has finished medications but is still having swelling and flashes of sharp pain. Also rotating tylenol/ motrin.   Denies any known fevers.

## 2022-09-22 ENCOUNTER — Encounter: Payer: Self-pay | Admitting: Internal Medicine

## 2022-09-22 ENCOUNTER — Encounter: Payer: Self-pay | Admitting: *Deleted

## 2022-09-22 ENCOUNTER — Ambulatory Visit: Payer: BC Managed Care – PPO | Admitting: Internal Medicine

## 2022-09-22 VITALS — BP 138/84 | HR 90 | Temp 97.5°F | Ht 74.0 in | Wt 288.0 lb

## 2022-09-22 DIAGNOSIS — H6021 Malignant otitis externa, right ear: Secondary | ICD-10-CM | POA: Diagnosis not present

## 2022-09-22 MED ORDER — HYDROCODONE-ACETAMINOPHEN 5-325 MG PO TABS: 1.0000 | ORAL_TABLET | Freq: Every evening | ORAL | 0 refills | Status: DC | PRN

## 2022-09-22 MED ORDER — CIPROFLOXACIN HCL 500 MG PO TABS: 500.0000 mg | ORAL_TABLET | Freq: Two times a day (BID) | ORAL | 0 refills | Status: DC

## 2022-09-22 NOTE — Addendum Note (Signed)
Addended by: Viviana Simpler I on: 09/22/2022 01:01 PM   Modules accepted: Orders

## 2022-09-22 NOTE — Assessment & Plan Note (Signed)
Ongoing symptoms ?some response to the cipro drops Will add cipro orally 500 bid x 7 days --continue the cefdinir Urgent ENT evaluaton

## 2022-09-22 NOTE — Progress Notes (Signed)
Subjective:    Patient ID: [name], male    DOB: [birth year], 62 y.o.   MRN: [id number]  HPI Here due to ongoing problems with his right ear  Started 10-11 days ago Rx for augmentin didn't really help too much Has now been using the cipro/dexamethasone in there--but still sore on the outside Then to urgent care and given cefdinir 4 days ago  No travel No trauma No prior ear problems  Has taken ibuprofen 2 or 3---some help Had trouble sleeping last night  Current Outpatient Medications on File Prior to Visit  Medication Sig Dispense Refill   albuterol (VENTOLIN HFA) 108 (90 Base) MCG/ACT inhaler Inhale 1-2 puffs into the lungs every 6 (six) hours as needed for wheezing or shortness of breath. 1 each 1   aspirin EC 81 MG tablet Take 81 mg by mouth daily.     cefdinir (OMNICEF) 300 MG capsule Take 1 capsule (300 mg total) by mouth 2 (two) times daily for 7 days. 14 capsule 0   ciprofloxacin-dexamethasone (CIPRODEX) OTIC suspension Place 4 drops into the right ear 2 (two) times daily. 7.5 mL 0   Continuous Blood Gluc Receiver ([place]) [place] by Does not apply route.     Continuous Blood Gluc Sensor (DEXCOM G7 SENSOR) MISC by Does not apply route.     cyclobenzaprine (FLEXERIL) 5 MG tablet TAKE 1 TABLET BY MOUTH EVERY NIGHT AT BEDTIME 30 tablet 1   Dulaglutide (TRULICITY) 4.5 0000000 SOPN Inject 4.5 mg into the skin once a week.     ezetimibe (ZETIA) 10 MG tablet Take 1 tablet (10 mg total) by mouth daily. 90 tablet 3   FLUoxetine (PROZAC) 20 MG tablet TAKE 1 TABLET BY MOUTH ONCE A DAY 90 tablet 1   gabapentin (NEURONTIN) 100 MG capsule TAKE 1 TO 3 CAPSULES BY MOUTH 3 TIMES DAILY AS NEEDED 270 capsule 1   insulin aspart (NOVOLOG FLEXPEN) 100 UNIT/ML FlexPen Per sliding scale     insulin degludec (TRESIBA) 200 UNIT/ML FlexTouch Pen Inject 32 Units into the skin every evening.     JARDIANCE 25 MG TABS tablet TAKE 1 TABLET BY MOUTH ONCE A DAY 30 tablet 2   metFORMIN  (GLUCOPHAGE) 1000 MG tablet TAKE 1 TABLET (1,000 MG TOTAL) BY MOUTH 2 (TWO) TIMES DAILY WITH A MEAL. 180 tablet 2   Multiple Vitamin (MULTIVITAMIN) capsule Take 1 capsule by mouth daily.     nebivolol (BYSTOLIC) 5 MG tablet Take 1 tablet (5 mg total) by mouth daily. 90 tablet 2   ondansetron (ZOFRAN) 4 MG tablet Take 1 tablet (4 mg total) by mouth every 6 (six) hours. 12 tablet 0   Vitamin D, Ergocalciferol, (DRISDOL) 1.25 MG (50000 UNIT) CAPS capsule Take 1 capsule (50,000 Units total) by mouth every 7 (seven) days. 13 capsule 0   nitroGLYCERIN (NITROSTAT) 0.4 MG SL tablet Place 1 tablet (0.4 mg total) under the tongue every 5 (five) minutes as needed for chest pain. 25 tablet 3   No current facility-administered medications on file prior to visit.    Allergies  Allergen Reactions   Diclofenac Sodium Shortness Of Breath, Itching and Other (See Comments)    Hypotension    Voltaren [Diclofenac Sodium] Anaphylaxis   Lipitor [Atorvastatin]     Myalgias at '80mg'$  dose   Metoprolol     Lightheaded/dizzy/SOB.     Ramipril     Lightheaded/dizzy/SOB.      Past Medical History:  Diagnosis Date   Allergic rhinitis,  cause unspecified    Anxiety    Chronic diastolic CHF (congestive heart failure) ([place])    a. 03/2017 Echo: EF 55%, Gr1DD, nl RV fxn.   Degeneration of intervertebral disc, site unspecified    Diabetes mellitus    Hyperlipidemia    Hypertension    Impotence of organic origin    Joint pain    Nonobstructive CAD    a. 03/2014 Cath: LM nl, LAD nl, D1 30ost/p, LCX nl, RCA 30d, EF 60%; b. 03/2017 MV: EF 56%, small, moderate basal inf/mid inf defect->prob inf thinning vs mild ischemia-->low-risk; c. 08/2021 Cath: LM nl, LAD 20ost/p, LCX large, 22m RCA large, 30d, EF 50-55%. Nl filling pressures-->Med rx.   Obesity    Obesity, unspecified    Obstructive sleep apnea (adult) (pediatric)    Other chronic allergic conjunctivitis    Personal history of colonic polyps    SOB (shortness of  breath)     Past Surgical History:  Procedure Laterality Date   [facility]  [facility] event with nausea, diaphoresis, hypertension.  D-dimer elevated at 2.54 with negative CT chest. and VQ scan.  Cardiac enzymes negative x 3.  Labs normal.  Cardiology consult---cardiolite abn; cath, 2D-echo, holter monitor unrevealing.  SE H&V.   Allergy Consult  07/11/2010   SOB, itching, facial tingling, hypotension.  Allergy testing: no food allergies; +reaction to tree pollen, grass pollen, dust mites, mold.  Avoid diclofenac and similar products.  WOrvil Feil   APPENDECTOMY     arthroscopy of knee  1980   BACK SURGERY     x3   CATARACT EXTRACTION Left    COLONOSCOPY  07/11/2009   single polyp; repeat in 5 years.  HAmedeo Plenty   GI consult  07/11/2010   CT chest in ED (gastric thickening).  HAmedeo Plenty  No EGD warranted due to lack of symptoms.   LAPAROSCOPIC GASTRIC BANDING  09/15/09   LEFT HEART CATHETERIZATION WITH CORONARY ANGIOGRAM N/A 04/02/2014   Procedure: LEFT HEART CATHETERIZATION WITH CORONARY ANGIOGRAM;  Surgeon: HSinclair Grooms MD;  [facility];  Service: Cardiovascular;  Laterality: N/A;   QUADRICEPS REPAIR     left   QUADRICEPS TENDON REPAIR     left   RETINAL DETACHMENT SURGERY     RIGHT/LEFT HEART CATH AND CORONARY ANGIOGRAPHY N/A 08/13/2021   Procedure: RIGHT/LEFT HEART CATH AND CORONARY ANGIOGRAPHY;  Surgeon: MBurnell Blanks MD;  [facility];  Service: Cardiovascular;  Laterality: N/A;   ROTATOR CUFF REPAIR     right   Sleep study  07/11/2010   repeat sleep study due to weight loss.  Severe OSA; CPAP titration to 8 cm.     SPINE SURGERY     TONSILLECTOMY AND ADENOIDECTOMY  1960    Family History  Problem Relation Age of Onset   Cancer Father        skin   Heart disease Father 639      AMI first age 64 stents.   Diabetes Father    Hypertension Father    Hyperlipidemia Father    Peripheral Artery Disease Father    Parkinson's disease  Father    Cancer Paternal Grandfather        skin   Diabetes Mother    Arthritis Mother        total hip replacement   Kidney failure Mother    Depression Sister    Diabetes Sister    Cancer Maternal Uncle  lymphoma   Prostate cancer Maternal Uncle    Colon cancer Neg Hx     Social History   Socioeconomic History   Marital status: Married    Spouse name: Estill Bamberg   Number of children: 3   Years of education: Not on file   Highest education level: Not on file  Occupational History   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    Employer: [employer]    Comment: Supervisor x 23 yrs   Occupation: Retired  Tobacco Use   Smoking status: Former    Packs/day: 0.25    Years: 2.00    Additional pack years: 0.00    Total pack years: 0.50    Types: Cigarettes   Smokeless tobacco: Never  Substance and Sexual Activity   Alcohol use: Yes    Alcohol/week: 0.0 standard drinks of alcohol    Comment: occasional Beer once every two weeks on the weekends   Drug use: No   Sexual activity: Yes  Other Topics Concern   Not on file  Social History Narrative   Married 1986       Children: 3 children, 3 grandchildren; 2 great grandkids      Lives: with wife, dog.      Employment: employed.      Tobacco: never      Alcohol: beer twice per month on weekends.      Drugs: none      Exercise: walking three days per week.      Seatbelt: 100% of time      Guns: loaded and secured/locked.      Sunscreen: yes.   Social Determinants of Health   Financial Resource Strain: Not on file  Food Insecurity: Not on file  Transportation Needs: Not on file  Physical Activity: Not on file  Stress: Not on file  Social Connections: Not on file  Intimate Partner Violence: Not on file   Review of Systems No fever Chronic hearing loss and tinnitus----no change Needs extraction of upper left molar Has cavity on right     Objective:   Physical Exam Constitutional:       Appearance: Normal appearance.  HENT:     Head:     Comments: No TMJ tenderness    Left Ear: Tympanic membrane and ear canal normal.     Ears:     Comments: Right tragal tenderness Swelling in canal--and debris is blocking the TM    Mouth/Throat:     Pharynx: No oropharyngeal exudate or posterior oropharyngeal erythema.     Comments: No lesions Musculoskeletal:     Cervical back: Neck supple.  Lymphadenopathy:     Cervical: No cervical adenopathy.  Neurological:     Mental Status: He is alert.            Assessment & Plan:

## 2022-09-29 NOTE — Telephone Encounter (Signed)
Great! I am glad they were able to get you in so quickly!

## 2022-10-06 ENCOUNTER — Other Ambulatory Visit: Payer: Self-pay | Admitting: Family Medicine

## 2022-10-06 NOTE — Telephone Encounter (Signed)
Refill request for GABAPENTIN 100MG  CAPSULE   LOV - 03/08/22 Next OV - not scheduled Last refill - 05/29/22 #270/1

## 2022-10-11 ENCOUNTER — Other Ambulatory Visit (HOSPITAL_BASED_OUTPATIENT_CLINIC_OR_DEPARTMENT_OTHER): Payer: Self-pay

## 2022-10-11 ENCOUNTER — Ambulatory Visit (HOSPITAL_BASED_OUTPATIENT_CLINIC_OR_DEPARTMENT_OTHER): Payer: BC Managed Care – PPO | Admitting: Pulmonary Disease

## 2022-10-11 ENCOUNTER — Encounter (HOSPITAL_BASED_OUTPATIENT_CLINIC_OR_DEPARTMENT_OTHER): Payer: Self-pay | Admitting: Pulmonary Disease

## 2022-10-11 VITALS — BP 142/82 | HR 92 | Temp 97.6°F | Ht 73.0 in | Wt 284.0 lb

## 2022-10-11 DIAGNOSIS — R0609 Other forms of dyspnea: Secondary | ICD-10-CM

## 2022-10-11 DIAGNOSIS — G4733 Obstructive sleep apnea (adult) (pediatric): Secondary | ICD-10-CM

## 2022-10-11 MED ORDER — ALBUTEROL SULFATE HFA 108 (90 BASE) MCG/ACT IN AERS: 1.0000 | INHALATION_SPRAY | Freq: Four times a day (QID) | RESPIRATORY_TRACT | 1 refills | Status: AC | PRN

## 2022-10-11 NOTE — Assessment & Plan Note (Signed)
Previously attributed to obesity and deconditioning.  No evidence of significant pulmonary disease. Using albuterol only as needed during pollen season. Will provide refill

## 2022-10-11 NOTE — Progress Notes (Signed)
   Subjective:    Patient ID: [name], male    DOB: Dec 16, 1960, 62 y.o.   MRN: [id number]  HPI  62 yo minimal smoker for FU of shortness of breath. PMH - morbid obesity s/p lap band in 2011, weight dropped from 398 to his current weight of 298 pounds.   - LHC  2015 -high LVEDP and started on Aldactone   Chief Complaint  Patient presents with   Follow-up    Follow up. Patient has no complaints.    His last office visit was 64 year ago with me  attributed dyspnea to deconditioning and obesity, cardiac and pulmonary work-up has been negative.  No evidence of pulmonary hypertension, no evidence of ILD .  PFTs only showed mild extraparenchymal restriction due to obesity   We did a home sleep test which showed mild OSA especially when supine.  He has stopped using his CPAP machine needed to go.  He instead uses some kind of a battery-operated machine that blows air into his nostrils and he prefers this.  He states that he sleeps well and denies sleep pressure in the daytime.  Wife has not complained about snoring.  His weight is down 10 pounds  he stopped using statin and his joint pains decreased.  Due to this he is more ambulatory and his dyspnea is decreased. He denies pedal edema, HbA1c is down to 7.2, he is on Trulicity. He rarely uses albuterol and requests a refill Uses OTC antihistaminic for pollen season  Significant tests/ events reviewed   02/03/2022 HST: AHI 12/hr - 298 lbs - Events were mainly noted in the supine position    PFTs 09/2021 -no airway obstruction with ratio 76, FEV1 83%, FVC 82%, TLC 75% with DLCO 87% - PFTs 07/15/14 with low erv only, c/w with effects of obesity    LHC 04/02/14 with LVEDP 22 by Dr Tamala Julian > rec add aldactone   CTA 05/2014 negative pulmonary embolism, subtle groundglass densities in the lungs  Review of Systems neg for any significant sore throat, dysphagia, itching, sneezing, nasal congestion or excess/ purulent secretions, fever, chills,  sweats, unintended wt loss, pleuritic or exertional cp, hempoptysis, orthopnea pnd or change in chronic leg swelling. Also denies presyncope, palpitations, heartburn, abdominal pain, nausea, vomiting, diarrhea or change in bowel or urinary habits, dysuria,hematuria, rash, arthralgias, visual complaints, headache, numbness weakness or ataxia.     Objective:   Physical Exam  Gen. Pleasant, obese, in no distress ENT - no lesions, no post nasal drip Neck: No JVD, no thyromegaly, no carotid bruits Lungs: no use of accessory muscles, no dullness to percussion, decreased without rales or rhonchi  Cardiovascular: Rhythm regular, heart sounds  normal, no murmurs or gallops, no peripheral edema Musculoskeletal: No deformities, no cyanosis or clubbing , no tremors       Assessment & Plan:

## 2022-10-11 NOTE — Patient Instructions (Signed)
x refills on albuterol MDI  Call us as needed You have mild sleep apnea -avoid sleeping on your back

## 2022-10-11 NOTE — Assessment & Plan Note (Signed)
OSA is mild and predominantly in the supine position.  He does not want to get back on CPAP therapy or trial oral appliance. Weight loss measures encouraged and this would be the main therapy as well as positional therapy with avoiding sleeping on his back

## 2022-11-11 ENCOUNTER — Other Ambulatory Visit: Payer: Self-pay | Admitting: Nurse Practitioner

## 2022-11-11 NOTE — Telephone Encounter (Signed)
Refill request

## 2022-11-15 LAB — HM DIABETES EYE EXAM

## 2022-11-28 ENCOUNTER — Other Ambulatory Visit: Payer: Self-pay | Admitting: Family Medicine

## 2022-11-28 NOTE — Telephone Encounter (Signed)
Refill request for GABAPENTIN 100MG  CAPSULE   LOV - 09/22/22 Next OV - not scheduled Last refill - 10/09/22 #270/1  *also needs cyclobenzaprine refilled

## 2023-01-23 ENCOUNTER — Other Ambulatory Visit: Payer: Self-pay | Admitting: Family Medicine

## 2023-03-10 ENCOUNTER — Other Ambulatory Visit: Payer: Self-pay | Admitting: Family Medicine

## 2023-04-04 ENCOUNTER — Other Ambulatory Visit: Payer: Self-pay | Admitting: Cardiovascular Disease

## 2023-04-09 NOTE — Progress Notes (Unsigned)
Cardiology Office Note:   Date:  04/10/2023  ID:  [name], DOB April 03, 1961, MRN [id number] PCP: Joaquim Nam, MD  [facility] Providers Cardiologist:  Verne Carrow, MD    History of Present Illness:    Discussed the use of AI scribe software for clinical note transcription with the patient, who gave verbal consent to proceed.  History of Present Illness   [name], a 62 year old individual with a history of hypertension, hyperlipidemia, obesity status post lap band surgery, diabetes, sleep apnea, nonobstructive coronary artery disease, and chronic diastolic congestive heart failure, presents for a routine follow-up. He was first evaluated by cardiology in 2015 following an episode of chest pain and dyspnea. Despite normal cardiac markers, a stress myoview revealed an apical defect, leading to a cardiac catheterization. This procedure demonstrated mild coronary plaque but no obstructive disease, and elevated end diastolic pressure. Management of diastolic heart failure was recommended.  In 2018, a repeat echocardiogram showed normal LV function and grade one diastolic dysfunction. A nuclear stress test in September of the same year was low risk with no large areas of ischemia. However, in January 2023, he experienced recurrent chest pain and shortness of breath, leading to another cardiac catheterization which again showed mild coronary artery disease.  In August 2023, he reported no chest pain or shortness of breath but noted he had stopped his statin due to joint aches. There was consideration of lipid clinic referral, however, it appears he was never seen at the lipid clinic. Patient has continued with Zetia 10mg .  He reports that his health has been generally good over the past year, with significant improvement in breathing and reduction in joint aches which he attributes to discontinuing the statin. He does note some stable exertional dyspnea, but attributes this to  a lack of regular exercise due to caregiving responsibilities for his recently deceased mother and his wife. He also reports a history of a torn quad muscle, which has affected his mobility, particularly on stairs. He still works on his farm on a regular basis.  His last A1c, checked about a month and a half ago, was 8. He reports no current issues with leg swelling. He sleeps on two or three pillows at night, but does not need to significantly elevate himself to breathe.     Today patient denies chest pain, shortness of breath, lower extremity edema, fatigue, palpitations, melena, hematuria, hemoptysis, diaphoresis, weakness, presyncope, syncope, orthopnea, and PND.   Studies Reviewed:    EKG:   EKG Interpretation Date/Time:  Monday April 10 2023 11:04:33 EDT Ventricular Rate:  78 PR Interval:  150 QRS Duration:  92 QT Interval:  354 QTC Calculation: 403 R Axis:   -18  Text Interpretation: Normal sinus rhythm Normal ECG Confirmed by Perlie Gold [id number]) on 04/10/2023 11:12:20 AM     Risk Assessment/Calculations:              Physical Exam:   VS:  BP 120/78   Pulse 78   Resp 16   Ht 6\' 1"  (1.854 m)   Wt 286 lb (129.7 kg)   SpO2 99%   BMI 37.73 kg/m    Wt Readings from Last 3 Encounters:  04/10/23 286 lb (129.7 kg)  10/11/22 284 lb (128.8 kg)  09/22/22 288 lb (130.6 kg)     Physical Exam Vitals reviewed.  Constitutional:      Appearance: Normal appearance.  HENT:     Head: Normocephalic.  Eyes:  Pupils: Pupils are equal, round, and reactive to light.  Cardiovascular:     Rate and Rhythm: Normal rate and regular rhythm.     Pulses: Normal pulses.     Heart sounds: Normal heart sounds.  Pulmonary:     Effort: Pulmonary effort is normal.     Breath sounds: Normal breath sounds.  Abdominal:     General: Abdomen is flat.     Palpations: Abdomen is soft.  Musculoskeletal:     Right lower leg: No edema.     Left lower leg: No edema.  Skin:    General:  Skin is warm and dry.     Capillary Refill: Capillary refill takes less than 2 seconds.  Neurological:     General: No focal deficit present.     Mental Status: He is alert and oriented to person, place, and time.  Psychiatric:        Mood and Affect: Mood normal.        Behavior: Behavior normal.        Thought Content: Thought content normal.        Judgment: Judgment normal.     Physical Exam   CHEST: lungs clear to auscultation CARDIOVASCULAR: heart rhythm regular EXTREMITIES: no edema observed       ASSESSMENT AND PLAN:     Assessment and Plan    Chronic Diastolic Congestive Heart Failure No current symptoms of volume overload. No chest pain or significant shortness of breath. No leg swelling. -Continue Jardiance  Hyperlipidemia Patient stopped statin due to side effects. Currently on Zetia 10mg  daily. Last LDL was elevated at 134. -Check LDL direct today. -If LDL is above goal 100, refer to lipid clinic for consideration of non-statin therapy such as Repatha.  Hypertension Blood pressure well controlled today. -Continue current antihypertensive regimen of Bystolic 5mg . -Check metabolic panel today to monitor kidney function.   Diabetes Last reported A1C was 8. -Continue regular follow up with diabetes specialist.  Follow-up -Return in 12 months unless new symptoms arise.                 Signed, Perlie Gold, PA-C

## 2023-04-10 ENCOUNTER — Encounter: Payer: Self-pay | Admitting: Cardiology

## 2023-04-10 ENCOUNTER — Ambulatory Visit: Payer: BC Managed Care – PPO | Attending: Cardiology | Admitting: Cardiology

## 2023-04-10 VITALS — BP 120/78 | HR 78 | Resp 16 | Ht 73.0 in | Wt 286.0 lb

## 2023-04-10 DIAGNOSIS — I10 Essential (primary) hypertension: Secondary | ICD-10-CM | POA: Diagnosis not present

## 2023-04-10 DIAGNOSIS — I25110 Atherosclerotic heart disease of native coronary artery with unstable angina pectoris: Secondary | ICD-10-CM

## 2023-04-10 DIAGNOSIS — E119 Type 2 diabetes mellitus without complications: Secondary | ICD-10-CM

## 2023-04-10 DIAGNOSIS — I5032 Chronic diastolic (congestive) heart failure: Secondary | ICD-10-CM | POA: Diagnosis not present

## 2023-04-10 DIAGNOSIS — R0609 Other forms of dyspnea: Secondary | ICD-10-CM | POA: Diagnosis not present

## 2023-04-10 DIAGNOSIS — Z794 Long term (current) use of insulin: Secondary | ICD-10-CM

## 2023-04-10 LAB — BASIC METABOLIC PANEL
BUN/Creatinine Ratio: 15 (ref 10–24)
BUN: 14 mg/dL (ref 8–27)
CO2: 28 mmol/L (ref 20–29)
Calcium: 9.2 mg/dL (ref 8.6–10.2)
Chloride: 102 mmol/L (ref 96–106)
Creatinine, Ser: 0.91 mg/dL (ref 0.76–1.27)
Glucose: 116 mg/dL — ABNORMAL HIGH (ref 70–99)
Potassium: 4.9 mmol/L (ref 3.5–5.2)
Sodium: 141 mmol/L (ref 134–144)
eGFR: 95 mL/min/{1.73_m2} (ref >59)

## 2023-04-10 LAB — LDL CHOLESTEROL, DIRECT: LDL Direct: 107 mg/dL — ABNORMAL HIGH (ref 0–99)

## 2023-04-10 NOTE — Patient Instructions (Signed)
Medication Instructions:  Your physician recommends that you continue on your current medications as directed. Please refer to the Current Medication list given to you today.  *If you need a refill on your cardiac medications before your next appointment, please call your pharmacy*   Lab Work: BMET and LDL Direct today.  If you have labs (blood work) drawn today and your tests are completely normal, you will receive your results only by: MyChart Message (if you have MyChart) OR A paper copy in the mail If you have any lab test that is abnormal or we need to change your treatment, we will call you to review the results.   Testing/Procedures: None   Follow-Up: At [facility], you and your health needs are our priority.  As part of our continuing mission to provide you with exceptional heart care, we have created designated Provider Care Teams.  These Care Teams include your primary Cardiologist (physician) and Advanced Practice Providers (APPs -  Physician Assistants and Nurse Practitioners) who all work together to provide you with the care you need, when you need it.  We recommend signing up for the patient portal called "MyChart".  Sign up information is provided on this After Visit Summary.  MyChart is used to connect with patients for Virtual Visits (Telemedicine).  Patients are able to view lab/test results, encounter notes, upcoming appointments, etc.  Non-urgent messages can be sent to your provider as well.   To learn more about what you can do with MyChart, go to [email or web address].    Your next appointment:   1 year(s)  Provider:   Verne Carrow, MD  or Perlie Gold, PA-C

## 2023-04-14 ENCOUNTER — Other Ambulatory Visit: Payer: Self-pay | Admitting: Family Medicine

## 2023-04-14 DIAGNOSIS — Z1212 Encounter for screening for malignant neoplasm of rectum: Secondary | ICD-10-CM

## 2023-04-14 DIAGNOSIS — Z1211 Encounter for screening for malignant neoplasm of colon: Secondary | ICD-10-CM

## 2023-04-26 ENCOUNTER — Telehealth: Payer: Self-pay | Admitting: *Deleted

## 2023-04-26 DIAGNOSIS — E785 Hyperlipidemia, unspecified: Secondary | ICD-10-CM

## 2023-04-26 NOTE — Telephone Encounter (Signed)
-----   Message from Perlie Gold sent at 04/11/2023  5:09 PM EDT ----- Metabolic panel/kidney function very stable. LDL is improved from last check but still above goal of less than 100. Would recommend lipid clinic referral for consideration of PCSK9.

## 2023-05-09 ENCOUNTER — Emergency Department: Payer: BC Managed Care – PPO

## 2023-05-09 ENCOUNTER — Other Ambulatory Visit: Payer: Self-pay

## 2023-05-09 ENCOUNTER — Emergency Department
Admission: EM | Admit: 2023-05-09 | Discharge: 2023-05-09 | Disposition: A | Discharge status: Home / Self Care | Payer: BC Managed Care – PPO | Attending: Emergency Medicine | Admitting: Emergency Medicine

## 2023-05-09 DIAGNOSIS — I110 Hypertensive heart disease with heart failure: Secondary | ICD-10-CM | POA: Insufficient documentation

## 2023-05-09 DIAGNOSIS — E119 Type 2 diabetes mellitus without complications: Secondary | ICD-10-CM | POA: Diagnosis not present

## 2023-05-09 DIAGNOSIS — R1012 Left upper quadrant pain: Secondary | ICD-10-CM | POA: Insufficient documentation

## 2023-05-09 DIAGNOSIS — I2510 Atherosclerotic heart disease of native coronary artery without angina pectoris: Secondary | ICD-10-CM | POA: Diagnosis not present

## 2023-05-09 DIAGNOSIS — I5032 Chronic diastolic (congestive) heart failure: Secondary | ICD-10-CM | POA: Diagnosis not present

## 2023-05-09 DIAGNOSIS — R1013 Epigastric pain: Secondary | ICD-10-CM | POA: Diagnosis not present

## 2023-05-09 DIAGNOSIS — K828 Other specified diseases of gallbladder: Secondary | ICD-10-CM

## 2023-05-09 DIAGNOSIS — R1010 Upper abdominal pain, unspecified: Secondary | ICD-10-CM

## 2023-05-09 LAB — CBC
HCT: 45.6 % (ref 39.0–52.0)
Hemoglobin: 15.4 g/dL (ref 13.0–17.0)
MCH: 29.6 pg (ref 26.0–34.0)
MCHC: 33.8 g/dL (ref 30.0–36.0)
MCV: 87.5 fL (ref 80.0–100.0)
Platelets: 351 10*3/uL (ref 150–400)
RBC: 5.21 MIL/uL (ref 4.22–5.81)
RDW: 13 % (ref 11.5–15.5)
WBC: 12.9 10*3/uL — ABNORMAL HIGH (ref 4.0–10.5)
nRBC: 0 % (ref 0.0–0.2)

## 2023-05-09 LAB — LIPASE, BLOOD: Lipase: 22 U/L (ref 11–51)

## 2023-05-09 LAB — BASIC METABOLIC PANEL
Anion gap: 8 (ref 5–15)
BUN: 15 mg/dL (ref 8–23)
CO2: 26 mmol/L (ref 22–32)
Calcium: 9 mg/dL (ref 8.9–10.3)
Chloride: 104 mmol/L (ref 98–111)
Creatinine, Ser: 0.95 mg/dL (ref 0.61–1.24)
GFR, Estimated: >60 mL/min (ref >60)
Glucose, Bld: 224 mg/dL — ABNORMAL HIGH (ref 70–99)
Potassium: 4.8 mmol/L (ref 3.5–5.1)
Sodium: 138 mmol/L (ref 135–145)

## 2023-05-09 LAB — HEPATIC FUNCTION PANEL
ALT: 15 U/L (ref 0–44)
AST: 16 U/L (ref 15–41)
Albumin: 3.6 g/dL (ref 3.5–5.0)
Alkaline Phosphatase: 77 U/L (ref 38–126)
Bilirubin, Direct: <0.1 mg/dL (ref 0.0–0.2)
Total Bilirubin: 0.8 mg/dL (ref 0.3–1.2)
Total Protein: 7.2 g/dL (ref 6.5–8.1)

## 2023-05-09 LAB — BRAIN NATRIURETIC PEPTIDE: B Natriuretic Peptide: 105.7 pg/mL — ABNORMAL HIGH (ref 0.0–100.0)

## 2023-05-09 LAB — TROPONIN I (HIGH SENSITIVITY): Troponin I (High Sensitivity): 7 ng/L (ref <18)

## 2023-05-09 MED ORDER — OMEPRAZOLE MAGNESIUM 20 MG PO TBEC: 20.0000 mg | DELAYED_RELEASE_TABLET | Freq: Every day | ORAL | 3 refills | Status: DC

## 2023-05-09 NOTE — Discharge Instructions (Addendum)
Fortunately your testing in the emergency department today did not show any emergency conditions like infections or complete blockages that account for your abdominal pain.  There was some distention in your gallbladder and some stones which may cause discomfort in your upper abdomen.  This is not an emergency condition that would require surgery today but it is important that you talk with your general surgeon to discuss an elective procedure if this condition continues to bother you in the future.  I gave you contact information for general surgeon Dr. Aleen Campi, give their office a call this week to schedule an appointment.  Try doing what you have done in the past where you focus on liquids/soft foods before transitioning slowly back to solid foods.  Take omeprazole which is an acid reduction medication as prescribed.  Follow-up with your primary doctor.  If your symptoms do not get better, call GI doctor for an appointment and discuss endoscopy.  Thank you for choosing [name] for your health care today!  Please see your primary doctor this week for a follow up appointment.   If you have any new, worsening, or unexpected symptoms call your doctor right away or come back to the emergency department for reevaluation.  It was my pleasure to care for you today.   Daneil Dan Modesto Charon, MD

## 2023-05-09 NOTE — ED Notes (Signed)
Alert, NAD, calm, resps e/u, to CT, family at [facility].

## 2023-05-09 NOTE — ED Notes (Signed)
Pt alert, NAD, calm, interactive, [name] at [facility]. Tolerating well. Family at [facility] VSS.

## 2023-05-09 NOTE — ED Provider Notes (Signed)
[facility] Provider Note    Event Date/Time   First MD Initiated Contact with Patient 05/09/23 [phone]     (approximate)   History   Chest Pain and Abdominal Pain   HPI  [name] is a 62 y.o. male   Past medical history of chronic diastolic congestive heart failure, hypertension, hyperlipidemia, diabetes, nonobstructive CAD, OSA, chronic dyspnea on exertion previously attributed to deconditioning and obesity who presents to the emergency department with abdominal discomfort.  Has a previous lap band placed around 10 years ago.  He has occasional postprandial fullness/regurgitation, which has been worsening over the last several days.  He points to his left upper quadrant where he has discomfort.  It radiates to his back.    He has had many episodes similar to this in the past for which he makes himself do a diet "reset" where he eats liquid/soft foods and progresses slowly back to solid foods which really helps to remedy his symptoms.  He is not a significant alcohol or NSAID user.   He denies chest pain.  He denies any increase of his chronic exertional dyspnea.    He denies any cough, fever, or any acute changes in his baseline chest pain/dyspnea.  Passing flatus and has been making normal bowel movements.  He had no GU symptoms.    External Medical Documents Reviewed: Cardiac catheterization in February 2023 largely unremarkable, impression of normal right and left heart pressures and mild nonobstructive CAD, determined to be noncardiac chest pain with normal LV function with recommendation for continued medical management      Physical Exam   Triage Vital Signs: ED Triage Vitals  Encounter Vitals Group     BP 05/09/23 0900 (!) 184/80     Systolic BP Percentile --      Diastolic BP Percentile --      Pulse Rate 05/09/23 0900 66     Resp 05/09/23 0900 (!) 22     Temp 05/09/23 0900 97.7 F (36.5 C)     Temp src --      SpO2 05/09/23  0900 100 %     Weight 05/09/23 0857 286 lb (129.7 kg)     Height 05/09/23 0857 6\' 1"  (1.854 m)     Head Circumference --      Peak Flow --      Pain Score 05/09/23 0857 6     Pain Loc --      Pain Education --      Exclude from Growth Chart --     Most recent vital signs: Vitals:   05/09/23 0900 05/09/23 0930  BP: (!) 184/80 (!) 178/75  Pulse: 66 65  Resp: (!) 22 19  Temp: 97.7 F (36.5 C)   SpO2: 100% 100%    General: Awake, no distress.  CV:  Good peripheral perfusion.  Resp:  Normal effort.  Abd:  No distention.  Other:  Hypertensive otherwise vital signs unremarkable.  He has a nondistended soft abdomen with some mild left upper quadrant and epigastric tenderness to palpation.  He appears euvolemic.  He appears comfortable.   ED Results / Procedures / Treatments   Labs (all labs ordered are listed, but only abnormal results are displayed) Labs Reviewed  CBC - Abnormal; Notable for the following components:      Result Value   WBC 12.9 (*)    All other components within normal limits  BASIC METABOLIC PANEL  LIPASE, BLOOD  HEPATIC FUNCTION PANEL  BRAIN NATRIURETIC PEPTIDE  TROPONIN I (HIGH SENSITIVITY)     I ordered and reviewed the above labs they are notable for white blood cell count is mildly elevated at 12.9.  EKG  ED ECG REPORT I, Pilar Jarvis, the attending physician, personally viewed and interpreted this ECG.   Date: 05/09/2023  EKG Time: 0858  Rate: 68  Rhythm: sinus  Axis: nl  Intervals:none  ST&T Change: no stemi    RADIOLOGY I independently reviewed and interpreted chest x-ray and I see no obvious focality or pneumothorax I also reviewed radiologist's formal read.   PROCEDURES:  Critical Care performed: No  Procedures   MEDICATIONS ORDERED IN ED: Medications - No data to display   IMPRESSION / MDM / ASSESSMENT AND PLAN / ED COURSE  I reviewed the triage vital signs and the nursing notes.                                 Patient's presentation is most consistent with acute presentation with potential threat to life or bodily function.  Differential diagnosis includes, but is not limited to, GERD/gastritis, pancreatitis, cholecystitis or cholelithiasis, Lap-Band complication like slippage, bowel obstruction, considered but less likely cardiopulmonary emergencies like ACS, dissection, PE   The patient is on the cardiac monitor to evaluate for evidence of arrhythmia and/or significant heart rate changes.  MDM:    Upper abdominal discomfort postprandially consistent with episodes in the past though worse these last several days with associated occasional regurgitation.  I do not think he is completely obstructed as he is passing flatus and making bowel movements, though he does have some epigastric and left upper quadrant tenderness albeit mild should check CT for obstruction, Lap-Band slippage, rule out intra-abdominal catastrophes like perforated bowel or other infections or surgical pathologies.  I considered cardiopulmonary emergencies like ACS dissection or PE but I think less likely given his chest pain and dyspnea are at baseline chronic unchanged with significant workup in the past largely unrevealing.  I will defer further workup at this time given no changes in these baseline symptoms.   Ultimately disposition depends on the results of his CT scan -- if unremarkable I do think that this may be recurrence of his previous symptoms that have been resolved with soft diet progression to solids, and will prescribe PPI as well, and GI referral.      FINAL CLINICAL IMPRESSION(S) / ED DIAGNOSES   Final diagnoses:  Upper abdominal pain     Rx / DC Orders   ED Discharge Orders          Ordered    Ambulatory referral to Gastroenterology        05/09/23 0936    omeprazole (PRILOSEC OTC) 20 MG tablet  Daily        05/09/23 0936             Note:  This document was prepared using Dragon  voice recognition software and may include unintentional dictation errors.    Pilar Jarvis, MD 05/09/23 [phone]

## 2023-05-09 NOTE — ED Triage Notes (Signed)
Pt to ED for epigastric/chest pain worsening this am. Increased shob for the past few weeks. Reports pain radiating to back.

## 2023-05-10 ENCOUNTER — Other Ambulatory Visit (HOSPITAL_COMMUNITY): Payer: Self-pay

## 2023-05-10 ENCOUNTER — Ambulatory Visit: Payer: BC Managed Care – PPO | Attending: Cardiology | Admitting: Pharmacist

## 2023-05-10 ENCOUNTER — Telehealth: Payer: Self-pay

## 2023-05-10 ENCOUNTER — Telehealth: Payer: Self-pay | Admitting: Pharmacy Technician

## 2023-05-10 DIAGNOSIS — E785 Hyperlipidemia, unspecified: Secondary | ICD-10-CM | POA: Diagnosis not present

## 2023-05-10 MED ORDER — REPATHA SURECLICK 140 MG/ML ~~LOC~~ SOAJ: 1.0000 mL | SUBCUTANEOUS | 3 refills | Status: DC

## 2023-05-10 NOTE — Telephone Encounter (Signed)
Pharmacy Patient Advocate Encounter   Received notification from  [facility] charts  that prior authorization for repatha is required/requested.   Insurance verification completed.   The patient is insured through [employer] [facility] .   Per test claim: PA required; PA submitted to above mentioned insurance via CoverMyMeds Key/confirmation #/EOC [id number] Status is pending

## 2023-05-10 NOTE — Telephone Encounter (Signed)
Called patient and made him aware of approval. Rx sent to [place] [facility] per request. Reminded to get copay card He will get labs in 2-3 months.

## 2023-05-10 NOTE — Addendum Note (Signed)
Addended by: Malena Peer D on: 05/10/2023 04:10 PM   Modules accepted: Orders

## 2023-05-10 NOTE — Assessment & Plan Note (Signed)
Assessment: LDL-C is above goal of less than 70 due to diabetes and mild CAD on ezetimibe 10 mg daily He is intolerant to atorvastatin, rosuvastatin, simvastatin and pravastatin Statins cause muscle ache and joint pain Previously over 400 pounds status post lap band now weighs 286 Plans to increase his physical activity Discussed Repatha Discussed mechanisms of action, dosing, side effects and potential decreases in LDL cholesterol.  Also reviewed cost information and co-pay card.  Currently has commercial state health [employer] [facility] plan.  Plan: Submit prior authorization for Repatha SureClick 140 mg Continue ezetimibe for now.  If LDL-C is sufficiently low at recheck could stop Follow-up labs in 2 to 3 months

## 2023-05-10 NOTE — Telephone Encounter (Signed)
Pharmacy Patient Advocate Encounter  Received notification from [employer] that Prior Authorization for repatha has been APPROVED from 05/10/23 to 05/08/24. Ran test claim, Copay is $30.00 one month. This test claim was processed through [employer] [facility]- copay amounts may vary at other pharmacies due to pharmacy/plan contracts, or as the patient moves through the different stages of their insurance plan.   PA #/Case ID/Reference #: [id number]

## 2023-05-10 NOTE — Transitions of Care (Post Inpatient/ED Visit) (Signed)
I spoke with pt who was about to go in for cardiology appt and pt will try to cb today or tomorrow and if pt does not cb I will try and call pt back.        05/10/2023  Name: [name] MRN: [id number] DOB: 09/29/1960  Today's TOC FU Call Status: Today's TOC FU Call Status:: Unsuccessful Call (1st Attempt) Unsuccessful Call (1st Attempt) Date: 05/10/23 (I spoke with pt and he was going in for card appt and pt will try to cb later today or tomorrow and if he does not I am to try to call him backl.)  Attempted to reach the patient regarding the most recent Inpatient/ED visit.  Follow Up Plan: Additional outreach attempts will be made to reach the patient to complete the Transitions of Care (Post Inpatient/ED visit) call.   Signature Lewanda Rife, LPN

## 2023-05-10 NOTE — Progress Notes (Unsigned)
Patient ID: [name]                 DOB: 1961-06-22                    MRN: [id number]      HPI: [name] is a 62 y.o. male patient referred to lipid clinic by Perlie Gold, PA. PMH is significant for hypertension, hyperlipidemia, obesity status post lap band surgery, diabetes, sleep apnea, nonobstructive coronary artery disease, and chronic diastolic congestive heart failure and chest pain.   Patient presents today to lipid clinic.  His latest LDL-C is 107 on ezetimibe 10 mg daily.  He has been intolerant to multiple statins.  All cause muscle pain joint pain and shortness of breath.  He is not interested in trying any more statins.  Over the last few years he has taken care of his mother mother-in-law and disabled wife.  Feels as though his physical activity and lifestyle change because of this.  Last year his mother and mother-in-law passed away.  Still taking care of his disabled wife but is hoping that he his activity level will increase.  Last A1c was elevated at 8, working with endocrinologist to improve blood sugar control.  Wearing a Dexcom.   Reviewed PCSK-9 inhibitors with patient. Discussed mechanisms of action, dosing, side effects and potential decreases in LDL cholesterol.  Also reviewed cost information and co-pay card.  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  Current Medications: ezetimibe 10mg  daily Intolerances: atorvastatin 40, 80mg , simvastatin 40mg , pravastatin 20mg  (muscle pains, joint aches, SOB) rosuvastatin Risk Factors: DM, mild CAD LDL-C goal: <70 ApoB goal: <80  Diet: has been having a hard time eating for the last month Daughter does a lot of the cooking Roasted veggies, small amount of rice Drink: coffee, diet soda, milk, water  Exercise: limited due to taking care of disabled wife but hoping to increase  Family History:  Family History  Problem Relation Age of Onset   Cancer Father        skin   Heart disease Father  74       AMI first age 33; stents.   Diabetes Father    Hypertension Father    Hyperlipidemia Father    Peripheral Artery Disease Father    Parkinson's disease Father    Cancer Paternal Grandfather        skin   Diabetes Mother    Arthritis Mother        total hip replacement   Kidney failure Mother    Depression Sister    Diabetes Sister    Cancer Maternal Uncle        lymphoma   Prostate cancer Maternal Uncle    Colon cancer Neg Hx      Social History: occasional ETOH <1 beer per week, no tobacco  Labs: Lipid Panel     Component Value Date/Time   CHOL 197 02/14/2022 1032   TRIG 106 02/14/2022 1032   TRIG 88 07/24/2006 1009   HDL 44 02/14/2022 1032   CHOLHDL 4.5 02/14/2022 1032   CHOLHDL 3 08/05/2019 0823   VLDL 20.8 08/05/2019 0823   LDLCALC 134 (H) 02/14/2022 1032   LDLDIRECT 107 (H) 04/10/2023 1135   LABVLDL 19 02/14/2022 1032    Past Medical History:  Diagnosis Date   Allergic rhinitis, cause unspecified    Anxiety    Chronic diastolic CHF (congestive heart failure) (HCC)    a.  03/2017 Echo: EF 55%, Gr1DD, nl RV fxn.   Degeneration of intervertebral disc, site unspecified    Diabetes mellitus    Hyperlipidemia    Hypertension    Impotence of organic origin    Joint pain    Nonobstructive CAD    a. 03/2014 Cath: LM nl, LAD nl, D1 30ost/p, LCX nl, RCA 30d, EF 60%; b. 03/2017 MV: EF 56%, small, moderate basal inf/mid inf defect->prob inf thinning vs mild ischemia-->low-risk; c. 08/2021 Cath: LM nl, LAD 20ost/p, LCX large, 45m, RCA large, 30d, EF 50-55%. Nl filling pressures-->Med rx.   Obesity    Obesity, unspecified    Obstructive sleep apnea (adult) (pediatric)    Other chronic allergic conjunctivitis    Personal history of colonic polyps    SOB (shortness of breath)     Current Outpatient Medications on File Prior to Visit  Medication Sig Dispense Refill   albuterol (VENTOLIN HFA) 108 (90 Base) MCG/ACT inhaler Inhale 1-2 puffs into the lungs every 6  (six) hours as needed for wheezing or shortness of breath. 1 each 1   aspirin EC 81 MG tablet Take 81 mg by mouth daily.     Continuous Blood Gluc Receiver (DEXCOM G7 RECEIVER) DEVI by Does not apply route.     Continuous Blood Gluc Sensor (DEXCOM G7 SENSOR) MISC by Does not apply route.     cyclobenzaprine (FLEXERIL) 5 MG tablet TAKE ONE TABLET BY MOUTH EVERY NIGHT AT BEDTIME 30 tablet 1   Dulaglutide (TRULICITY) 4.5 MG/0.5ML SOPN Inject 4.5 mg into the skin once a week.     ezetimibe (ZETIA) 10 MG tablet Take 1 tablet (10 mg total) by mouth daily. 90 tablet 3   FLUoxetine (PROZAC) 20 MG tablet TAKE ONE TABLET BY MOUTH ONCE A DAY 90 tablet 1   gabapentin (NEURONTIN) 100 MG capsule TAKE ONE TO THREE CAPSULES BY MOUTH THREE TIMES DAILY AS NEEDED 270 capsule 0   HYDROcodone-acetaminophen (NORCO/VICODIN) 5-325 MG tablet Take 1-2 tablets by mouth at bedtime as needed for moderate pain. 10 tablet 0   insulin aspart (NOVOLOG FLEXPEN) 100 UNIT/ML FlexPen Per sliding scale     insulin degludec (TRESIBA) 200 UNIT/ML FlexTouch Pen Inject 32 Units into the skin every evening.     JARDIANCE 25 MG TABS tablet TAKE 1 TABLET BY MOUTH ONCE A DAY 30 tablet 2   metFORMIN (GLUCOPHAGE) 1000 MG tablet TAKE 1 TABLET (1,000 MG TOTAL) BY MOUTH 2 (TWO) TIMES DAILY WITH A MEAL. 180 tablet 2   Multiple Vitamin (MULTIVITAMIN) capsule Take 1 capsule by mouth daily.     nebivolol (BYSTOLIC) 5 MG tablet Take 1 tablet (5 mg total) by mouth daily. Please keep scheduled appointment for future refills. Thank you. 30 tablet 0   nitroGLYCERIN (NITROSTAT) 0.4 MG SL tablet Place 1 tablet (0.4 mg total) under the tongue every 5 (five) minutes as needed for chest pain. 25 tablet 3   omeprazole (PRILOSEC OTC) 20 MG tablet Take 1 tablet (20 mg total) by mouth daily. 90 tablet 3   ondansetron (ZOFRAN) 4 MG tablet Take 1 tablet (4 mg total) by mouth every 6 (six) hours. (Patient taking differently: Take 4 mg by mouth every 8 (eight) hours as  needed.) 12 tablet 0   Vitamin D, Ergocalciferol, (DRISDOL) 1.25 MG (50000 UNIT) CAPS capsule Take 1 capsule (50,000 Units total) by mouth every 7 (seven) days. 13 capsule 0   No current facility-administered medications on file prior to visit.    Allergies  Allergen Reactions   Diclofenac Sodium Shortness Of Breath, Itching and Other (See Comments)    Hypotension    Statins Shortness Of Breath   Voltaren [Diclofenac Sodium] Anaphylaxis   Lipitor [Atorvastatin]     Myalgias at 80mg  dose   Metoprolol     Lightheaded/dizzy/SOB.     Ramipril     Lightheaded/dizzy/SOB.      Assessment/Plan:  1. Hyperlipidemia -  Dyslipidemia Assessment: LDL-C is above goal of less than 70 due to diabetes and mild CAD on ezetimibe 10 mg daily He is intolerant to atorvastatin, rosuvastatin, simvastatin and pravastatin Statins cause muscle ache and joint pain Previously over 400 pounds status post lap band now weighs 286 Plans to increase his physical activity Discussed Repatha Discussed mechanisms of action, dosing, side effects and potential decreases in LDL cholesterol.  Also reviewed cost information and co-pay card.  Currently has commercial state health [employer] [facility] plan.  Plan: Submit prior authorization for Repatha SureClick 140 mg Continue ezetimibe for now.  If LDL-C is sufficiently low at recheck could stop Follow-up labs in 2 to 3 months    Thank you,  Olene Floss, Pharm.D, BCACP, BCPS, CPP [facility] A Division of [facility] [address] [place], [place] [address]  Phone: [phone]; Fax: [phone]

## 2023-05-10 NOTE — Telephone Encounter (Signed)
-----   Message from Olene Floss sent at 05/10/2023  2:39 PM EDT ----- Please do PA for Repatha I25.10 CAD, angina ,intolerant to atorvastatin 40, 80mg , simvastatin 40mg , pravastatin 20mg  (muscle pains, joint aches, SOB) rosuvastatin thanks

## 2023-05-10 NOTE — Patient Instructions (Signed)
Continue Zetia for now  I will submit a prior authorization for Repatha. I will call you once I hear back. Please call me at [phone] with any questions.   Repatha is a cholesterol medication that improved your body's ability to get rid of "bad cholesterol" known as LDL. It can lower your LDL up to 60%! It is an injection that is given under the skin every 2 weeks. The medication often requires a prior authorization from your insurance company. We will take care of submitting all the necessary information to your insurance company to get it approved. The most common side effects of Repatha include runny nose, symptoms of the common cold, rarely flu or flu-like symptoms, back/muscle pain in about 3-4% of the patients, and redness, pain, or bruising at the injection site. Tell your healthcare provider if you have any side effect that bothers you or that does not go away.    Try to increase physical activity

## 2023-05-11 ENCOUNTER — Other Ambulatory Visit: Payer: Self-pay | Admitting: Cardiovascular Disease

## 2023-05-14 ENCOUNTER — Emergency Department (HOSPITAL_BASED_OUTPATIENT_CLINIC_OR_DEPARTMENT_OTHER): Payer: BC Managed Care – PPO

## 2023-05-14 ENCOUNTER — Inpatient Hospital Stay (HOSPITAL_BASED_OUTPATIENT_CLINIC_OR_DEPARTMENT_OTHER)
Admission: EM | Admit: 2023-05-14 | Discharge: 2023-05-17 | DRG: 418 | Disposition: A | Discharge status: Home / Self Care | Payer: BC Managed Care – PPO | Attending: Internal Medicine | Admitting: Internal Medicine

## 2023-05-14 ENCOUNTER — Encounter (HOSPITAL_BASED_OUTPATIENT_CLINIC_OR_DEPARTMENT_OTHER): Payer: Self-pay

## 2023-05-14 ENCOUNTER — Other Ambulatory Visit: Payer: Self-pay

## 2023-05-14 DIAGNOSIS — Z87891 Personal history of nicotine dependence: Secondary | ICD-10-CM | POA: Diagnosis not present

## 2023-05-14 DIAGNOSIS — K8000 Calculus of gallbladder with acute cholecystitis without obstruction: Principal | ICD-10-CM | POA: Diagnosis present

## 2023-05-14 DIAGNOSIS — E872 Acidosis, unspecified: Secondary | ICD-10-CM | POA: Diagnosis not present

## 2023-05-14 DIAGNOSIS — Z7982 Long term (current) use of aspirin: Secondary | ICD-10-CM | POA: Diagnosis not present

## 2023-05-14 DIAGNOSIS — K819 Cholecystitis, unspecified: Principal | ICD-10-CM

## 2023-05-14 DIAGNOSIS — Z9842 Cataract extraction status, left eye: Secondary | ICD-10-CM | POA: Diagnosis not present

## 2023-05-14 DIAGNOSIS — Z83438 Family history of other disorder of lipoprotein metabolism and other lipidemia: Secondary | ICD-10-CM | POA: Diagnosis not present

## 2023-05-14 DIAGNOSIS — Z8261 Family history of arthritis: Secondary | ICD-10-CM | POA: Diagnosis not present

## 2023-05-14 DIAGNOSIS — Z8249 Family history of ischemic heart disease and other diseases of the circulatory system: Secondary | ICD-10-CM

## 2023-05-14 DIAGNOSIS — Z841 Family history of disorders of kidney and ureter: Secondary | ICD-10-CM

## 2023-05-14 DIAGNOSIS — K810 Acute cholecystitis: Secondary | ICD-10-CM | POA: Diagnosis present

## 2023-05-14 DIAGNOSIS — Z888 Allergy status to other drugs, medicaments and biological substances status: Secondary | ICD-10-CM | POA: Diagnosis not present

## 2023-05-14 DIAGNOSIS — Z79899 Other long term (current) drug therapy: Secondary | ICD-10-CM

## 2023-05-14 DIAGNOSIS — Z794 Long term (current) use of insulin: Secondary | ICD-10-CM | POA: Diagnosis not present

## 2023-05-14 DIAGNOSIS — I2510 Atherosclerotic heart disease of native coronary artery without angina pectoris: Secondary | ICD-10-CM | POA: Diagnosis present

## 2023-05-14 DIAGNOSIS — Z820 Family history of epilepsy and other diseases of the nervous system: Secondary | ICD-10-CM | POA: Diagnosis not present

## 2023-05-14 DIAGNOSIS — I10 Essential (primary) hypertension: Secondary | ICD-10-CM | POA: Diagnosis not present

## 2023-05-14 DIAGNOSIS — E785 Hyperlipidemia, unspecified: Secondary | ICD-10-CM | POA: Diagnosis present

## 2023-05-14 DIAGNOSIS — E66812 Obesity, class 2: Secondary | ICD-10-CM | POA: Diagnosis present

## 2023-05-14 DIAGNOSIS — I5032 Chronic diastolic (congestive) heart failure: Secondary | ICD-10-CM | POA: Diagnosis present

## 2023-05-14 DIAGNOSIS — Z6836 Body mass index (BMI) 36.0-36.9, adult: Secondary | ICD-10-CM | POA: Diagnosis not present

## 2023-05-14 DIAGNOSIS — Z7985 Long-term (current) use of injectable non-insulin antidiabetic drugs: Secondary | ICD-10-CM | POA: Diagnosis not present

## 2023-05-14 DIAGNOSIS — F419 Anxiety disorder, unspecified: Secondary | ICD-10-CM | POA: Diagnosis present

## 2023-05-14 DIAGNOSIS — E119 Type 2 diabetes mellitus without complications: Secondary | ICD-10-CM | POA: Diagnosis present

## 2023-05-14 DIAGNOSIS — G4733 Obstructive sleep apnea (adult) (pediatric): Secondary | ICD-10-CM | POA: Diagnosis present

## 2023-05-14 DIAGNOSIS — I110 Hypertensive heart disease with heart failure: Secondary | ICD-10-CM | POA: Diagnosis present

## 2023-05-14 DIAGNOSIS — Z833 Family history of diabetes mellitus: Secondary | ICD-10-CM

## 2023-05-14 DIAGNOSIS — Z7984 Long term (current) use of oral hypoglycemic drugs: Secondary | ICD-10-CM

## 2023-05-14 LAB — URINALYSIS, ROUTINE W REFLEX MICROSCOPIC
Bacteria, UA: NONE SEEN
Bilirubin Urine: NEGATIVE
Glucose, UA: >1000 mg/dL — AB
Ketones, ur: 40 mg/dL — AB
Leukocytes,Ua: NEGATIVE
Nitrite: NEGATIVE
Protein, ur: NEGATIVE mg/dL
Specific Gravity, Urine: 1.029 (ref 1.005–1.030)
pH: 6 (ref 5.0–8.0)

## 2023-05-14 LAB — GLUCOSE, CAPILLARY: Glucose-Capillary: 123 mg/dL — ABNORMAL HIGH (ref 70–99)

## 2023-05-14 LAB — COMPREHENSIVE METABOLIC PANEL
ALT: 8 U/L (ref 0–44)
AST: 12 U/L — ABNORMAL LOW (ref 15–41)
Albumin: 4.2 g/dL (ref 3.5–5.0)
Alkaline Phosphatase: 86 U/L (ref 38–126)
Anion gap: 15 (ref 5–15)
BUN: 15 mg/dL (ref 8–23)
CO2: 25 mmol/L (ref 22–32)
Calcium: 10.2 mg/dL (ref 8.9–10.3)
Chloride: 96 mmol/L — ABNORMAL LOW (ref 98–111)
Creatinine, Ser: 0.94 mg/dL (ref 0.61–1.24)
GFR, Estimated: >60 mL/min (ref >60)
Glucose, Bld: 169 mg/dL — ABNORMAL HIGH (ref 70–99)
Potassium: 4.2 mmol/L (ref 3.5–5.1)
Sodium: 136 mmol/L (ref 135–145)
Total Bilirubin: 0.8 mg/dL (ref 0.3–1.2)
Total Protein: 7.8 g/dL (ref 6.5–8.1)

## 2023-05-14 LAB — CBC WITH DIFFERENTIAL/PLATELET
Abs Immature Granulocytes: 0.06 10*3/uL (ref 0.00–0.07)
Basophils Absolute: 0.1 10*3/uL (ref 0.0–0.1)
Basophils Relative: 1 %
Eosinophils Absolute: 0.2 10*3/uL (ref 0.0–0.5)
Eosinophils Relative: 1 %
HCT: 43.1 % (ref 39.0–52.0)
Hemoglobin: 14.7 g/dL (ref 13.0–17.0)
Immature Granulocytes: 0 %
Lymphocytes Relative: 26 %
Lymphs Abs: 3.6 10*3/uL (ref 0.7–4.0)
MCH: 29.8 pg (ref 26.0–34.0)
MCHC: 34.1 g/dL (ref 30.0–36.0)
MCV: 87.2 fL (ref 80.0–100.0)
Monocytes Absolute: 0.6 10*3/uL (ref 0.1–1.0)
Monocytes Relative: 4 %
Neutro Abs: 9.2 10*3/uL — ABNORMAL HIGH (ref 1.7–7.7)
Neutrophils Relative %: 68 %
Platelets: 371 10*3/uL (ref 150–400)
RBC: 4.94 MIL/uL (ref 4.22–5.81)
RDW: 12.9 % (ref 11.5–15.5)
WBC: 13.6 10*3/uL — ABNORMAL HIGH (ref 4.0–10.5)
nRBC: 0 % (ref 0.0–0.2)

## 2023-05-14 LAB — APTT: aPTT: 31 s (ref 24–36)

## 2023-05-14 LAB — PROTIME-INR
INR: 1.1 (ref 0.8–1.2)
Prothrombin Time: 14.1 s (ref 11.4–15.2)

## 2023-05-14 LAB — LACTIC ACID, PLASMA: Lactic Acid, Venous: 1.3 mmol/L (ref 0.5–1.9)

## 2023-05-14 LAB — CBG MONITORING, ED: Glucose-Capillary: 147 mg/dL — ABNORMAL HIGH (ref 70–99)

## 2023-05-14 LAB — LIPASE, BLOOD: Lipase: <10 U/L — ABNORMAL LOW (ref 11–51)

## 2023-05-14 MED ORDER — INSULIN ASPART 100 UNIT/ML IJ SOLN: 0.0000 [IU] | INTRAMUSCULAR | Status: DC

## 2023-05-14 MED ORDER — SODIUM CHLORIDE 0.9 % IV SOLN: INTRAVENOUS | Status: DC

## 2023-05-14 MED ORDER — INSULIN ASPART 100 UNIT/ML IJ SOLN
0.0000 [IU] | INTRAMUSCULAR | Status: DC
  Administered 2023-05-14: 2 [IU] via SUBCUTANEOUS

## 2023-05-14 MED ORDER — ONDANSETRON HCL 4 MG/2ML IJ SOLN
4.0000 mg | Freq: Once | INTRAMUSCULAR | Status: AC
  Administered 2023-05-14: 4 mg via INTRAVENOUS
  Filled 2023-05-14: qty 2

## 2023-05-14 MED ORDER — ACETAMINOPHEN 650 MG RE SUPP: 650.0000 mg | Freq: Four times a day (QID) | RECTAL | Status: DC | PRN

## 2023-05-14 MED ORDER — ACETAMINOPHEN 325 MG PO TABS
650.0000 mg | ORAL_TABLET | Freq: Four times a day (QID) | ORAL | Status: DC | PRN
  Administered 2023-05-16: 650 mg via ORAL
  Filled 2023-05-14: qty 2

## 2023-05-14 MED ORDER — PIPERACILLIN-TAZOBACTAM 3.375 G IVPB
3.3750 g | Freq: Three times a day (TID) | INTRAVENOUS | Status: DC
  Administered 2023-05-15 – 2023-05-17 (×7): 3.375 g via INTRAVENOUS
  Filled 2023-05-14 (×7): qty 50

## 2023-05-14 MED ORDER — ONDANSETRON HCL 4 MG/2ML IJ SOLN: 4.0000 mg | Freq: Four times a day (QID) | INTRAMUSCULAR | Status: DC | PRN

## 2023-05-14 MED ORDER — MORPHINE SULFATE (PF) 4 MG/ML IV SOLN
4.0000 mg | Freq: Once | INTRAVENOUS | Status: AC
  Administered 2023-05-14: 4 mg via INTRAVENOUS
  Filled 2023-05-14: qty 1

## 2023-05-14 MED ORDER — PIPERACILLIN-TAZOBACTAM 3.375 G IVPB 30 MIN
3.3750 g | Freq: Once | INTRAVENOUS | Status: AC
  Administered 2023-05-14: 3.375 g via INTRAVENOUS
  Filled 2023-05-14: qty 50

## 2023-05-14 MED ORDER — HYDROMORPHONE HCL 1 MG/ML IJ SOLN
0.5000 mg | INTRAMUSCULAR | Status: DC | PRN
  Administered 2023-05-15: 0.5 mg via INTRAVENOUS
  Filled 2023-05-14: qty 0.5

## 2023-05-14 NOTE — Progress Notes (Signed)
ED Pharmacy Antibiotic Sign Off An antibiotic consult was received from an ED provider for zosyn per pharmacy dosing for intra-abdominal infection. A chart review was completed to assess appropriateness.   The following one time order(s) were placed:  Zosyn 3.375g IV x 1 over 30 min  Further antibiotic and/or antibiotic pharmacy consults should be ordered by the admitting provider if indicated.   Thank you for allowing pharmacy to be a part of this patient's care.   Daylene Posey, [facility]  Clinical Pharmacist 05/14/23 7:45 PM

## 2023-05-14 NOTE — H&P (Signed)
History and Physical    [name]:[id number] DOB: [birth year] DOA: 05/14/2023  PCP: Joaquim Nam, MD   Patient coming from: Home   Chief Complaint: Abdominal pain, nausea   HPI: [name] is a 62 y.o. male with medical history significant for hypertension, hyperlipidemia, type 2 diabetes mellitus, nonobstructive CAD, chronic diastolic CHF, and obesity with history of gastric banding in 2011 who presents with abdominal pain, nausea, and dry heaves.  Patient has had similar episodes recently that were self-limited. He was seen in the emergency department on 05/09/2023 with similar complaints, was found to have cholelithiasis without evidence for acute cholecystitis on [name], and he was discharged home.    He had been referred to a surgeon for evaluation of this but had a particularly severe episode this afternoon with severe pain, nausea, and dry heaves that prompted his presentation to the ED.  Pain is severe and radiating from upper abdomen through to his back. He has had subjective fevers at home. He denies any chest pain or SOB.    [facility] ED Course: Upon arrival to the ED, patient is found to be afebrile initially with normal heart rate and stable blood pressure, later developing fever and tachycardia.  Labs are most notable for WBC 13,600.  Ultrasound reveals cholelithiasis with mild gallbladder wall thickening suspicious for acute cholecystitis.  Surgery (Dr. Freida Busman) was consulted by the ED physician, patient was treated with morphine, Zofran, and Zosyn, and he was transferred to [facility].  Review of Systems:  All other systems reviewed and apart from HPI, are negative.  Past Medical History:  Diagnosis Date   Allergic rhinitis, cause unspecified    Anxiety    Chronic diastolic CHF (congestive heart failure) (HCC)    a. 03/2017 Echo: EF 55%, Gr1DD, nl RV fxn.   Degeneration of intervertebral disc, site unspecified    Diabetes mellitus     Hyperlipidemia    Hypertension    Impotence of organic origin    Joint pain    Nonobstructive CAD    a. 03/2014 Cath: LM nl, LAD nl, D1 30ost/p, LCX nl, RCA 30d, EF 60%; b. 03/2017 MV: EF 56%, small, moderate basal inf/mid inf defect->prob inf thinning vs mild ischemia-->low-risk; c. 08/2021 Cath: LM nl, LAD 20ost/p, LCX large, 52m, RCA large, 30d, EF 50-55%. Nl filling pressures-->Med rx.   Obesity    Obesity, unspecified    Obstructive sleep apnea (adult) (pediatric)    Other chronic allergic conjunctivitis    Personal history of colonic polyps    SOB (shortness of breath)     Past Surgical History:  Procedure Laterality Date   admission  01/2009   [facility].  Syncopal event with nausea, diaphoresis, hypertension.  D-dimer elevated at 2.54 with negative CT chest. and VQ scan.  Cardiac enzymes negative x 3.  Labs normal.  Cardiology consult---cardiolite abn; cath, 2D-echo, holter monitor unrevealing.  SE H&V.   Allergy Consult  07/11/2010   SOB, itching, facial tingling, hypotension.  Allergy testing: no food allergies; +reaction to tree pollen, grass pollen, dust mites, mold.  Avoid diclofenac and similar products.  [name].   APPENDECTOMY     arthroscopy of knee  1980   BACK SURGERY     x3   CATARACT EXTRACTION Left    COLONOSCOPY  07/11/2009   single polyp; repeat in 5 years.  [name].   GI consult  07/11/2010   CT chest in ED (gastric thickening).  [name].  No EGD warranted due to lack of symptoms.   LAPAROSCOPIC GASTRIC BANDING  09/15/09   LEFT HEART CATHETERIZATION WITH CORONARY ANGIOGRAM N/A 04/02/2014   Procedure: LEFT HEART CATHETERIZATION WITH CORONARY ANGIOGRAM;  Surgeon: Lesleigh Noe, MD;  Location: [facility] CATH LAB;  Service: Cardiovascular;  Laterality: N/A;   QUADRICEPS REPAIR     left   QUADRICEPS TENDON REPAIR     left   RETINAL DETACHMENT SURGERY     RIGHT/LEFT HEART CATH AND CORONARY ANGIOGRAPHY N/A 08/13/2021   Procedure: RIGHT/LEFT HEART CATH AND CORONARY ANGIOGRAPHY;   Surgeon: Kathleene Hazel, MD;  Location: [facility] INVASIVE CV LAB;  Service: Cardiovascular;  Laterality: N/A;   ROTATOR CUFF REPAIR     right   Sleep study  07/11/2010   repeat sleep study due to weight loss.  Severe OSA; CPAP titration to 8 cm.     SPINE SURGERY     TONSILLECTOMY AND ADENOIDECTOMY  1960    Social History:   reports that he has quit smoking. His smoking use included cigarettes. He has a 0.5 pack-year smoking history. He has never used smokeless tobacco. He reports current alcohol use. He reports that he does not use drugs.  Allergies  Allergen Reactions   Diclofenac Sodium Shortness Of Breath, Itching and Other (See Comments)    Hypotension    Statins Shortness Of Breath   Voltaren [Diclofenac Sodium] Anaphylaxis   Lipitor [Atorvastatin]     Myalgias at 80mg  dose   Metoprolol     Lightheaded/dizzy/SOB.     Ramipril     Lightheaded/dizzy/SOB.      Family History  Problem Relation Age of Onset   Cancer Father        skin   Heart disease Father 60       AMI first age 68; stents.   Diabetes Father    Hypertension Father    Hyperlipidemia Father    Peripheral Artery Disease Father    Parkinson's disease Father    Cancer Paternal Grandfather        skin   Diabetes Mother    Arthritis Mother        total hip replacement   Kidney failure Mother    Depression Sister    Diabetes Sister    Cancer Maternal Uncle        lymphoma   Prostate cancer Maternal Uncle    Colon cancer Neg Hx      Prior to Admission medications   Medication Sig Start Date End Date Taking? Authorizing Provider  albuterol (VENTOLIN HFA) 108 (90 Base) MCG/ACT inhaler Inhale 1-2 puffs into the lungs every 6 (six) hours as needed for wheezing or shortness of breath. 10/11/22   Oretha Milch, MD  aspirin EC 81 MG tablet Take 81 mg by mouth daily.    [provider]  Continuous Blood Gluc Receiver (DEXCOM G7 RECEIVER) DEVI by Does not apply route.    [provider]   Continuous Blood Gluc Sensor (DEXCOM G7 SENSOR) MISC by Does not apply route.    [provider]  cyclobenzaprine (FLEXERIL) 5 MG tablet TAKE ONE TABLET BY MOUTH EVERY NIGHT AT BEDTIME 03/10/23   Joaquim Nam, MD  Dulaglutide (TRULICITY) 4.5 MG/0.5ML SOPN Inject 4.5 mg into the skin once a week.    [provider]  Evolocumab (REPATHA SURECLICK) 140 MG/ML SOAJ Inject 140 mg into the skin every 14 (fourteen) days. 05/10/23   Perlie Gold, PA-C  ezetimibe (ZETIA) 10 MG  tablet Take 1 tablet (10 mg total) by mouth daily. 02/14/22   Kathleene Hazel, MD  FLUoxetine (PROZAC) 20 MG tablet TAKE ONE TABLET BY MOUTH ONCE A DAY 01/24/23   Joaquim Nam, MD  gabapentin (NEURONTIN) 100 MG capsule TAKE ONE TO THREE CAPSULES BY MOUTH THREE TIMES DAILY AS NEEDED 11/29/22   Joaquim Nam, MD  HYDROcodone-acetaminophen (NORCO/VICODIN) 5-325 MG tablet Take 1-2 tablets by mouth at bedtime as needed for moderate pain. 09/22/22   Karie Schwalbe, MD  insulin aspart (NOVOLOG FLEXPEN) 100 UNIT/ML FlexPen Per sliding scale 02/26/21   Joaquim Nam, MD  insulin degludec (TRESIBA) 200 UNIT/ML FlexTouch Pen Inject 32 Units into the skin every evening. 10/14/21   Joaquim Nam, MD  JARDIANCE 25 MG TABS tablet TAKE 1 TABLET BY MOUTH ONCE A DAY 11/23/20   Carlus Pavlov, MD  metFORMIN (GLUCOPHAGE) 1000 MG tablet TAKE 1 TABLET (1,000 MG TOTAL) BY MOUTH 2 (TWO) TIMES DAILY WITH A MEAL. 10/28/19   Carlus Pavlov, MD  Multiple Vitamin (MULTIVITAMIN) capsule Take 1 capsule by mouth daily.    [provider]  nebivolol (BYSTOLIC) 5 MG tablet TAKE ONE TABLET (5 MG TOTAL) BY MOUTH DAILY. PLEASE KEEP SCHEDULED APPOINTMENT FOR FUTURE REFILLS. THANK YOU. 05/11/23   Kathleene Hazel, MD  nitroGLYCERIN (NITROSTAT) 0.4 MG SL tablet Place 1 tablet (0.4 mg total) under the tongue every 5 (five) minutes as needed for chest pain. 08/06/21 04/11/22  Creig Hines, NP  omeprazole  (PRILOSEC OTC) 20 MG tablet Take 1 tablet (20 mg total) by mouth daily. 05/09/23 05/08/24  Pilar Jarvis, MD  ondansetron (ZOFRAN) 4 MG tablet Take 1 tablet (4 mg total) by mouth every 6 (six) hours. Patient taking differently: Take 4 mg by mouth every 8 (eight) hours as needed. 02/21/22   Prosperi, Christian H, PA-C  Vitamin D, Ergocalciferol, (DRISDOL) 1.25 MG (50000 UNIT) CAPS capsule Take 1 capsule (50,000 Units total) by mouth every 7 (seven) days. 04/25/22   Worthy Rancher, MD    Physical Exam: Vitals:   05/14/23 1856 05/14/23 2100 05/14/23 2137 05/14/23 2211  BP: (!) 155/80 139/76 124/74   Pulse: 82 (!) 120 (!) 127   Resp: 20 20 16    Temp: 98 F (36.7 C) 98.4 F (36.9 C) (!) 100.4 F (38 C)   TempSrc: Oral Oral Oral   SpO2: 100% 95% 98%   Weight:    126.2 kg  Height:    6\' 1"  (1.854 m)    Constitutional: NAD, no pallor or diaphoresis  Eyes: PERTLA, lids and conjunctivae normal ENMT: Mucous membranes are moist. Posterior pharynx clear of any exudate or lesions.   Neck: supple, no masses  Respiratory: no wheezing, no crackles. No accessory muscle use.  Cardiovascular: S1 & S2 heard, regular rate and rhythm. No extremity edema.  Abdomen: Soft, no guarding, tender in upper abdomen. Bowel sounds active.  Musculoskeletal: no clubbing / cyanosis. No joint deformity upper and lower extremities.   Skin: no significant rashes, lesions, ulcers. Warm, dry, well-perfused. Neurologic: CN 2-12 grossly intact. Moving all extremities. Alert and oriented.  Psychiatric: Calm. Cooperative.    Labs and Imaging on Admission: I have personally reviewed following labs and imaging studies  CBC: Recent Labs  Lab 05/09/23 0900 05/14/23 1801  WBC 12.9* 13.6*  NEUTROABS  --  9.2*  HGB 15.4 14.7  HCT 45.6 43.1  MCV 87.5 87.2  PLT 351 371   Basic Metabolic Panel: Recent Labs  Lab 05/09/23  0900 05/14/23 1801  NA 138 136  K 4.8 4.2  CL 104 96*  CO2 26 25  GLUCOSE 224* 169*  BUN 15  15  CREATININE 0.95 0.94  CALCIUM 9.0 10.2   GFR: Estimated Creatinine Clearance: 110.5 mL/min (by C-G formula based on SCr of 0.94 mg/dL). Liver Function Tests: Recent Labs  Lab 05/09/23 0900 05/14/23 1801  AST 16 12*  ALT 15 8  ALKPHOS 77 86  BILITOT 0.8 0.8  PROT 7.2 7.8  ALBUMIN 3.6 4.2   Recent Labs  Lab 05/09/23 0900 05/14/23 1801  LIPASE 22 <10*   No results for input(s): "AMMONIA" in the last 168 hours. Coagulation Profile: No results for input(s): "INR", "PROTIME" in the last 168 hours. Cardiac Enzymes: No results for input(s): "CKTOTAL", "CKMB", "CKMBINDEX", "TROPONINI" in the last 168 hours. BNP (last 3 results) No results for input(s): "PROBNP" in the last 8760 hours. HbA1C: No results for input(s): "HGBA1C" in the last 72 hours. CBG: Recent Labs  Lab 05/14/23 2042  GLUCAP 147*   Lipid Profile: No results for input(s): "CHOL", "HDL", "LDLCALC", "TRIG", "CHOLHDL", "LDLDIRECT" in the last 72 hours. Thyroid Function Tests: No results for input(s): "TSH", "T4TOTAL", "FREET4", "T3FREE", "THYROIDAB" in the last 72 hours. Anemia Panel: No results for input(s): "VITAMINB12", "FOLATE", "FERRITIN", "TIBC", "IRON", "RETICCTPCT" in the last 72 hours. Urine analysis:    Component Value Date/Time   COLORURINE YELLOW 05/14/2023 1801   APPEARANCEUR CLEAR 05/14/2023 1801   LABSPEC 1.029 05/14/2023 1801   PHURINE 6.0 05/14/2023 1801   GLUCOSEU >1,000 (A) 05/14/2023 1801   HGBUR TRACE (A) 05/14/2023 1801   BILIRUBINUR NEGATIVE 05/14/2023 1801   BILIRUBINUR neg 05/28/2014 1456   KETONESUR 40 (A) 05/14/2023 1801   PROTEINUR NEGATIVE 05/14/2023 1801   UROBILINOGEN 0.2 05/28/2014 1456   UROBILINOGEN 1.0 01/10/2009 0001   NITRITE NEGATIVE 05/14/2023 1801   LEUKOCYTESUR NEGATIVE 05/14/2023 1801   Sepsis Labs: @LABRCNTIP (procalcitonin:4,lacticidven:4) )No results found for this or any previous visit (from the past 240 hour(s)).   Radiological Exams on  Admission: US Abdomen Limited RUQ (LIVER/GB)  Result Date: 05/14/2023 CLINICAL DATA:  Right upper quadrant pain EXAM: ULTRASOUND ABDOMEN LIMITED RIGHT UPPER QUADRANT COMPARISON:  05/09/2023 FINDINGS: Gallbladder: Moderately distended gallbladder containing sludge and multiple stones. Largest stone identified measures 8 mm in diameter. Mild gallbladder wall thickening at approximately 5 mm. Murphy sign unable to be adequately assessed as patient has been medicated. Common bile duct: Diameter: 2 mm. Liver: No focal lesion identified. Within normal limits in parenchymal echogenicity. Portal vein is patent on color Doppler imaging with normal direction of blood flow towards the liver. Other: None. IMPRESSION: Cholelithiasis with mild gallbladder wall thickening. Findings are suspicious for acute cholecystitis. Electronically Signed   By: Duanne Guess D.O.   On: 05/14/2023 19:13    EKG: Independently reviewed. Sinus rhythm.   Assessment/Plan   1. Acute calculus cholecystitis  - Continue bowel rest, IVF hydration, pain-control, and Zosyn; follow-up on surgery consultant's recommendations    2. CAD  - Mild non-obstructive CAD on cath in February 2023  - No angina    3. Chronic diastolic CHF  - Appears compensated   - Monitor weight and I/Os    4. Type II DM  - A1c was 8.1% in July 2024  - Check CBGs and use SSI only for now    5. Hypertension  - Treat as-needed only for now     DVT prophylaxis: SCDs  Code Status: Full  Level of Care: Level  of care: Med-Surg Family Communication: Daughter at bedside  Disposition Plan:  Patient is from: home  Anticipated d/c is to: Home  Anticipated d/c date is: 05/18/23  Patient currently: Pending surgical consult and management of acute cholecystitis  Consults called: General surgery  Admission status: Inpatient     Briscoe Deutscher, MD [facility]  05/14/2023, 10:17 PM

## 2023-05-14 NOTE — ED Triage Notes (Signed)
Pt c/o epigastric/ back pain onset 3wks ago, worsening since. States he was dx w gallstones & sludge, supposed to see surgeon tomorrow "but I just can't do this." Associated [place], denies diarrhea

## 2023-05-14 NOTE — ED Provider Notes (Signed)
[name] [facility]ERGENCY DEPARTMENT AT [facility] Provider Note   CSN: [id number] Arrival date & time: 05/14/23  1717     History  Chief Complaint  Patient presents with   Abdominal Pain    Epigastric/ gallbladder    [name] is a 62 y.o. male.  62 year old male with past medical history of CHF, coronary artery disease, hypertension, and hyperlipidemia presenting to the emergency department today with abdominal pain.  The patient was evaluated on the 29th.  He had a CT scan as well as an ultrasound and eventually was diagnosed with gallstones and gallbladder sludge.  The patient states that he was doing a little bit better until 2 to 3 hours prior to arrival.  He states that he started having severe epigastric and right upper quadrant pain rating to his back.  He has had dry heaving but denies any vomiting.  He states he was post to follow-up with the surgeon but the pain was too severe tonight so he came back to the ER for further evaluation.   Abdominal Pain Associated symptoms: nausea        Home Medications Prior to Admission medications   Medication Sig Start Date End Date Taking? Authorizing Provider  albuterol (VENTOLIN HFA) 108 (90 Base) MCG/ACT inhaler Inhale 1-2 puffs into the lungs every 6 (six) hours as needed for wheezing or shortness of breath. 10/11/22   Oretha Milch, MD  aspirin EC 81 MG tablet Take 81 mg by mouth daily.    [provider]  Continuous Blood Gluc Receiver (DEXCOM G7 RECEIVER) DEVI by Does not apply route.    [provider]  Continuous Blood Gluc Sensor (DEXCOM G7 SENSOR) MISC by Does not apply route.    [provider]  cyclobenzaprine (FLEXERIL) 5 MG tablet TAKE ONE TABLET BY MOUTH EVERY NIGHT AT BEDTIME 03/10/23   Joaquim Nam, MD  Dulaglutide (TRULICITY) 4.5 MG/0.5ML SOPN Inject 4.5 mg into the skin once a week.    [provider]  Evolocumab (REPATHA SURECLICK) 140 MG/ML SOAJ Inject 140 mg into  the skin every 14 (fourteen) days. 05/10/23   Perlie Gold, PA-C  ezetimibe (ZETIA) 10 MG tablet Take 1 tablet (10 mg total) by mouth daily. 02/14/22   Kathleene Hazel, MD  FLUoxetine (PROZAC) 20 MG tablet TAKE ONE TABLET BY MOUTH ONCE A DAY 01/24/23   Joaquim Nam, MD  gabapentin (NEURONTIN) 100 MG capsule TAKE ONE TO THREE CAPSULES BY MOUTH THREE TIMES DAILY AS NEEDED 11/29/22   Joaquim Nam, MD  HYDROcodone-acetaminophen (NORCO/VICODIN) 5-325 MG tablet Take 1-2 tablets by mouth at bedtime as needed for moderate pain. 09/22/22   Karie Schwalbe, MD  insulin aspart (NOVOLOG FLEXPEN) 100 UNIT/ML FlexPen Per sliding scale 02/26/21   Joaquim Nam, MD  insulin degludec (TRESIBA) 200 UNIT/ML FlexTouch Pen Inject 32 Units into the skin every evening. 10/14/21   Joaquim Nam, MD  JARDIANCE 25 MG TABS tablet TAKE 1 TABLET BY MOUTH ONCE A DAY 11/23/20   Carlus Pavlov, MD  metFORMIN (GLUCOPHAGE) 1000 MG tablet TAKE 1 TABLET (1,000 MG TOTAL) BY MOUTH 2 (TWO) TIMES DAILY WITH A MEAL. 10/28/19   Carlus Pavlov, MD  Multiple Vitamin (MULTIVITAMIN) capsule Take 1 capsule by mouth daily.    [provider]  nebivolol (BYSTOLIC) 5 MG tablet TAKE ONE TABLET (5 MG TOTAL) BY MOUTH DAILY. PLEASE KEEP SCHEDULED APPOINTMENT FOR FUTURE REFILLS. THANK YOU. 05/11/23   Kathleene Hazel, MD  nitroGLYCERIN (NITROSTAT) 0.4 MG SL tablet Place 1 tablet (0.4 mg total) under the tongue every 5 (five) minutes as needed for chest pain. 08/06/21 04/11/22  Creig Hines, NP  omeprazole (PRILOSEC OTC) 20 MG tablet Take 1 tablet (20 mg total) by mouth daily. 05/09/23 05/08/24  Pilar Jarvis, MD  ondansetron (ZOFRAN) 4 MG tablet Take 1 tablet (4 mg total) by mouth every 6 (six) hours. Patient taking differently: Take 4 mg by mouth every 8 (eight) hours as needed. 02/21/22   Prosperi, Christian H, PA-C  Vitamin D, Ergocalciferol, (DRISDOL) 1.25 MG (50000 UNIT) CAPS capsule Take 1 capsule  (50,000 Units total) by mouth every 7 (seven) days. 04/25/22   Worthy Rancher, MD      Allergies    Diclofenac sodium, Statins, Voltaren [diclofenac sodium], Lipitor [atorvastatin], Metoprolol, and Ramipril    Review of Systems   Review of Systems  Gastrointestinal:  Positive for abdominal pain and nausea.  All other systems reviewed and are negative.   Physical Exam Updated Vital Signs BP (!) 155/80 (BP Location: Right Arm)   Pulse 82   Temp 98 F (36.7 C) (Oral)   Resp 20   SpO2 100%  Physical Exam Vitals and nursing note reviewed.   Gen: Appears uncomfortable Eyes: PERRL, EOMI HEENT: no oropharyngeal swelling Neck: trachea midline Resp: clear to auscultation bilaterally Card: RRR, no murmurs, rubs, or gallops Abd: The patient is tender over the epigastrium right upper quadrant with guarding to deep palpation over the right upper quadrant Extremities: no calf tenderness, no edema Vascular: 2+ radial pulses bilaterally, 2+ DP pulses bilaterally Neuro: No focal deficits Skin: no rashes Psyc: acting appropriately   ED Results / Procedures / Treatments   Labs (all labs ordered are listed, but only abnormal results are displayed) Labs Reviewed  CBC WITH DIFFERENTIAL/PLATELET - Abnormal; Notable for the following components:      Result Value   WBC 13.6 (*)    Neutro Abs 9.2 (*)    All other components within normal limits  COMPREHENSIVE METABOLIC PANEL - Abnormal; Notable for the following components:   Chloride 96 (*)    Glucose, Bld 169 (*)    AST 12 (*)    All other components within normal limits  LIPASE, BLOOD - Abnormal; Notable for the following components:   Lipase <10 (*)    All other components within normal limits  URINALYSIS, ROUTINE W REFLEX MICROSCOPIC - Abnormal; Notable for the following components:   Glucose, UA >1,000 (*)    Hgb urine dipstick TRACE (*)    Ketones, ur 40 (*)    All other components within normal limits     EKG None  Radiology US Abdomen Limited RUQ (LIVER/GB)  Result Date: 05/14/2023 CLINICAL DATA:  Right upper quadrant pain EXAM: ULTRASOUND ABDOMEN LIMITED RIGHT UPPER QUADRANT COMPARISON:  05/09/2023 FINDINGS: Gallbladder: Moderately distended gallbladder containing sludge and multiple stones. Largest stone identified measures 8 mm in diameter. Mild gallbladder wall thickening at approximately 5 mm. Murphy sign unable to be adequately assessed as patient has been medicated. Common bile duct: Diameter: 2 mm. Liver: No focal lesion identified. Within normal limits in parenchymal echogenicity. Portal vein is patent on color Doppler imaging with normal direction of blood flow towards the liver. Other: None. IMPRESSION: Cholelithiasis with mild gallbladder wall thickening. Findings are suspicious for acute cholecystitis. Electronically Signed   By: Duanne Guess D.O.   On: 05/14/2023 19:13    Procedures Procedures    Medications Ordered in ED  Medications  piperacillin-tazobactam (ZOSYN) IVPB 3.375 g (has no administration in time range)  morphine (PF) 4 MG/ML injection 4 mg (4 mg Intravenous Given 05/14/23 1822)  ondansetron (ZOFRAN) injection 4 mg (4 mg Intravenous Given 05/14/23 1821)    ED Course/ Medical Decision Making/ A&P                                 Medical Decision Making 62 year old male with past medical history of CHF, coronary artery disease, hypertension, and hyperlipidemia presenting to the emergency department today with abdominal pain.  I will further evaluate patient here with basic labs including LFTs and a lipase to evaluate for hepatobiliary pathology or pancreatitis.  Will repeat an ultrasound here.  I give patient morphine and Zofran.  Will obtain EKG as well to screen for cardiac etiology although his symptoms are very reproducible here given his cardiac history.  Based on description of his symptoms and reassuring neurovascular exam suspicion for aortic dissection  is low at this time.  I will reevaluate for ultimate disposition.  The patient has normal LFTs.  Does have mild leukocytosis.  Ultrasound does show findings concerning for cholecystitis.  I did call discuss his case with Dr. Freida Busman.  She recommends hospital admission.  He is admitted to the hospitalist service for management.  He is given Zosyn here in the emergency department.  Amount and/or Complexity of Data Reviewed Labs: ordered. Radiology: ordered.  Risk Prescription drug management. Decision regarding hospitalization.           Final Clinical Impression(s) / ED Diagnoses Final diagnoses:  Cholecystitis    Rx / DC Orders ED Discharge Orders     None         Durwin Glaze, MD 05/14/23 2000

## 2023-05-14 NOTE — ED Notes (Signed)
Called floor x1.

## 2023-05-14 NOTE — Progress Notes (Signed)
Patient arrived to his room and ambulated to the bed. Daughter is present at bedside. Patient is alert and oriented x4, reported ABD pain 1/10. HR 127, temp 100.4. Patient requested for something to drink. Patient have a rash on the bilateral buttocks. Hospitalist admitting team was notified.  Brigetta Beckstrom

## 2023-05-14 NOTE — Progress Notes (Signed)
Pharmacy Antibiotic Note  [name] is a 62 y.o. male admitted on 05/14/2023 presenting with abdominal pain, concern for cholecystitis.  Pharmacy has been consulted for zosyn dosing.  Plan: Zosyn 3.375g IV q 8h (extended 4h infusion) Monitor renal function, surg recs/plans and LOT     Temp (24hrs), Avg:97.9 F (36.6 C), Min:97.7 F (36.5 C), Max:98 F (36.7 C)  Recent Labs  Lab 05/09/23 0900 05/14/23 1801  WBC 12.9* 13.6*  CREATININE 0.95 0.94    Estimated Creatinine Clearance: 112.1 mL/min (by C-G formula based on SCr of 0.94 mg/dL).    Allergies  Allergen Reactions   Diclofenac Sodium Shortness Of Breath, Itching and Other (See Comments)    Hypotension    Statins Shortness Of Breath   Voltaren [Diclofenac Sodium] Anaphylaxis   Lipitor [Atorvastatin]     Myalgias at 80mg  dose   Metoprolol     Lightheaded/dizzy/SOB.     Ramipril     Lightheaded/dizzy/SOB.      Daylene Posey, PharmD, [facility] Clinical Pharmacist ED Pharmacist Phone # [phone] 05/14/2023 8:34 PM

## 2023-05-15 ENCOUNTER — Encounter (HOSPITAL_COMMUNITY)
Admission: EM | Disposition: A | Discharge status: Home / Self Care | Payer: Self-pay | Source: Home / Self Care | Attending: Student

## 2023-05-15 ENCOUNTER — Other Ambulatory Visit: Payer: Self-pay

## 2023-05-15 ENCOUNTER — Encounter: Payer: Self-pay | Admitting: Surgery

## 2023-05-15 ENCOUNTER — Inpatient Hospital Stay (HOSPITAL_COMMUNITY): Payer: BC Managed Care – PPO | Admitting: Certified Registered"

## 2023-05-15 ENCOUNTER — Encounter (HOSPITAL_COMMUNITY): Payer: Self-pay | Admitting: Family Medicine

## 2023-05-15 ENCOUNTER — Ambulatory Visit: Payer: BC Managed Care – PPO | Admitting: Surgery

## 2023-05-15 DIAGNOSIS — K810 Acute cholecystitis: Secondary | ICD-10-CM | POA: Diagnosis not present

## 2023-05-15 HISTORY — PX: CHOLECYSTECTOMY: SHX55

## 2023-05-15 LAB — HEMOGLOBIN A1C
Hgb A1c MFr Bld: 8.6 % — ABNORMAL HIGH (ref 4.8–5.6)
Mean Plasma Glucose: 200.12 mg/dL

## 2023-05-15 LAB — COMPREHENSIVE METABOLIC PANEL
ALT: 32 U/L (ref 0–44)
AST: 85 U/L — ABNORMAL HIGH (ref 15–41)
Albumin: 2.8 g/dL — ABNORMAL LOW (ref 3.5–5.0)
Alkaline Phosphatase: 103 U/L (ref 38–126)
Anion gap: 10 (ref 5–15)
BUN: 13 mg/dL (ref 8–23)
CO2: 24 mmol/L (ref 22–32)
Calcium: 8.9 mg/dL (ref 8.9–10.3)
Chloride: 103 mmol/L (ref 98–111)
Creatinine, Ser: 1.03 mg/dL (ref 0.61–1.24)
GFR, Estimated: >60 mL/min (ref >60)
Glucose, Bld: 110 mg/dL — ABNORMAL HIGH (ref 70–99)
Potassium: 4.3 mmol/L (ref 3.5–5.1)
Sodium: 137 mmol/L (ref 135–145)
Total Bilirubin: 1.5 mg/dL — ABNORMAL HIGH (ref <1.2)
Total Protein: 6.4 g/dL — ABNORMAL LOW (ref 6.5–8.1)

## 2023-05-15 LAB — CBC
HCT: 41.7 % (ref 39.0–52.0)
Hemoglobin: 13.7 g/dL (ref 13.0–17.0)
MCH: 29.2 pg (ref 26.0–34.0)
MCHC: 32.9 g/dL (ref 30.0–36.0)
MCV: 88.9 fL (ref 80.0–100.0)
Platelets: 333 10*3/uL (ref 150–400)
RBC: 4.69 MIL/uL (ref 4.22–5.81)
RDW: 12.9 % (ref 11.5–15.5)
WBC: 9.3 10*3/uL (ref 4.0–10.5)
nRBC: 0 % (ref 0.0–0.2)

## 2023-05-15 LAB — GLUCOSE, CAPILLARY
Glucose-Capillary: 81 mg/dL (ref 70–99)
Glucose-Capillary: 107 mg/dL — ABNORMAL HIGH (ref 70–99)
Glucose-Capillary: 106 mg/dL — ABNORMAL HIGH (ref 70–99)
Glucose-Capillary: 115 mg/dL — ABNORMAL HIGH (ref 70–99)
Glucose-Capillary: 118 mg/dL — ABNORMAL HIGH (ref 70–99)
Glucose-Capillary: 142 mg/dL — ABNORMAL HIGH (ref 70–99)

## 2023-05-15 LAB — HIV ANTIBODY (ROUTINE TESTING W REFLEX): HIV Screen 4th Generation wRfx: NONREACTIVE

## 2023-05-15 SURGERY — LAPAROSCOPIC CHOLECYSTECTOMY
Anesthesia: General

## 2023-05-15 MED ORDER — EPHEDRINE 5 MG/ML INJ
INTRAVENOUS | Status: AC
  Filled 2023-05-15: qty 5

## 2023-05-15 MED ORDER — LABETALOL HCL 5 MG/ML IV SOLN
INTRAVENOUS | Status: DC | PRN
  Administered 2023-05-15: 5 mg via INTRAVENOUS

## 2023-05-15 MED ORDER — SUCCINYLCHOLINE CHLORIDE 200 MG/10ML IV SOSY
PREFILLED_SYRINGE | INTRAVENOUS | Status: DC | PRN
  Administered 2023-05-15: 150 mg via INTRAVENOUS

## 2023-05-15 MED ORDER — ONDANSETRON HCL 4 MG/2ML IJ SOLN
INTRAMUSCULAR | Status: AC
  Filled 2023-05-15: qty 2

## 2023-05-15 MED ORDER — ACETAMINOPHEN 500 MG PO TABS
1000.0000 mg | ORAL_TABLET | Freq: Once | ORAL | Status: AC
  Administered 2023-05-15: 1000 mg via ORAL
  Filled 2023-05-15: qty 2

## 2023-05-15 MED ORDER — ONDANSETRON HCL 4 MG/2ML IJ SOLN: 4.0000 mg | Freq: Once | INTRAMUSCULAR | Status: DC | PRN

## 2023-05-15 MED ORDER — ROCURONIUM BROMIDE 10 MG/ML (PF) SYRINGE
PREFILLED_SYRINGE | INTRAVENOUS | Status: DC | PRN
  Administered 2023-05-15: 10 mg via INTRAVENOUS
  Administered 2023-05-15: 50 mg via INTRAVENOUS

## 2023-05-15 MED ORDER — CHLORHEXIDINE GLUCONATE 0.12 % MT SOLN
OROMUCOSAL | Status: AC
  Administered 2023-05-15: 15 mL via OROMUCOSAL
  Filled 2023-05-15: qty 15

## 2023-05-15 MED ORDER — CHLORHEXIDINE GLUCONATE CLOTH 2 % EX PADS
6.0000 | MEDICATED_PAD | Freq: Once | CUTANEOUS | Status: AC
  Administered 2023-05-15: 6 via TOPICAL

## 2023-05-15 MED ORDER — LIDOCAINE 2% (20 MG/ML) 5 ML SYRINGE
INTRAMUSCULAR | Status: DC | PRN
  Administered 2023-05-15: 60 mg via INTRAVENOUS

## 2023-05-15 MED ORDER — PROPOFOL 10 MG/ML IV BOLUS
INTRAVENOUS | Status: DC | PRN
  Administered 2023-05-15: 200 mg via INTRAVENOUS

## 2023-05-15 MED ORDER — SODIUM CHLORIDE 0.9 % IV SOLN: INTRAVENOUS | Status: AC

## 2023-05-15 MED ORDER — LIDOCAINE 2% (20 MG/ML) 5 ML SYRINGE
INTRAMUSCULAR | Status: AC
  Filled 2023-05-15: qty 5

## 2023-05-15 MED ORDER — OXYCODONE HCL 5 MG/5ML PO SOLN: 5.0000 mg | Freq: Once | ORAL | Status: DC | PRN

## 2023-05-15 MED ORDER — PROPOFOL 10 MG/ML IV BOLUS
INTRAVENOUS | Status: AC
  Filled 2023-05-15: qty 20

## 2023-05-15 MED ORDER — HYDROMORPHONE HCL 1 MG/ML IJ SOLN
0.2500 mg | INTRAMUSCULAR | Status: DC | PRN
Start: 2023-05-15 — End: 2023-05-15

## 2023-05-15 MED ORDER — MIDAZOLAM HCL 2 MG/2ML IJ SOLN
INTRAMUSCULAR | Status: AC
  Filled 2023-05-15: qty 2

## 2023-05-15 MED ORDER — SUGAMMADEX SODIUM 200 MG/2ML IV SOLN
INTRAVENOUS | Status: DC | PRN
  Administered 2023-05-15: 50 mg via INTRAVENOUS
  Administered 2023-05-15: 200 mg via INTRAVENOUS

## 2023-05-15 MED ORDER — ORAL CARE MOUTH RINSE: 15.0000 mL | Freq: Once | OROMUCOSAL | Status: AC

## 2023-05-15 MED ORDER — PHENYLEPHRINE 80 MCG/ML (10ML) SYRINGE FOR IV PUSH (FOR BLOOD PRESSURE SUPPORT)
PREFILLED_SYRINGE | INTRAVENOUS | Status: AC
  Filled 2023-05-15: qty 10

## 2023-05-15 MED ORDER — FENTANYL CITRATE (PF) 250 MCG/5ML IJ SOLN
INTRAMUSCULAR | Status: DC | PRN
  Administered 2023-05-15: 100 ug via INTRAVENOUS
  Administered 2023-05-15: 50 ug via INTRAVENOUS
  Administered 2023-05-15: 100 ug via INTRAVENOUS

## 2023-05-15 MED ORDER — PHENYLEPHRINE 80 MCG/ML (10ML) SYRINGE FOR IV PUSH (FOR BLOOD PRESSURE SUPPORT)
PREFILLED_SYRINGE | INTRAVENOUS | Status: DC | PRN
  Administered 2023-05-15 (×2): 160 ug via INTRAVENOUS

## 2023-05-15 MED ORDER — FENTANYL CITRATE (PF) 250 MCG/5ML IJ SOLN
INTRAMUSCULAR | Status: AC
  Filled 2023-05-15: qty 5

## 2023-05-15 MED ORDER — OXYCODONE HCL 5 MG PO TABS
5.0000 mg | ORAL_TABLET | ORAL | Status: DC | PRN
  Administered 2023-05-15 – 2023-05-16 (×3): 5 mg via ORAL
  Filled 2023-05-15 (×3): qty 1

## 2023-05-15 MED ORDER — DEXAMETHASONE SODIUM PHOSPHATE 10 MG/ML IJ SOLN
INTRAMUSCULAR | Status: DC | PRN
  Administered 2023-05-15: 5 mg via INTRAVENOUS

## 2023-05-15 MED ORDER — DEXAMETHASONE SODIUM PHOSPHATE 10 MG/ML IJ SOLN
INTRAMUSCULAR | Status: AC
Start: 2023-05-15 — End: ?
  Filled 2023-05-15: qty 1

## 2023-05-15 MED ORDER — ROCURONIUM BROMIDE 10 MG/ML (PF) SYRINGE
PREFILLED_SYRINGE | INTRAVENOUS | Status: AC
  Filled 2023-05-15: qty 10

## 2023-05-15 MED ORDER — ENOXAPARIN SODIUM 40 MG/0.4ML IJ SOSY
40.0000 mg | PREFILLED_SYRINGE | Freq: Every evening | INTRAMUSCULAR | Status: DC
  Administered 2023-05-16: 40 mg via SUBCUTANEOUS
  Filled 2023-05-15: qty 0.4

## 2023-05-15 MED ORDER — SUGAMMADEX SODIUM 200 MG/2ML IV SOLN: INTRAVENOUS | Status: DC | PRN

## 2023-05-15 MED ORDER — INSULIN ASPART 100 UNIT/ML IJ SOLN
0.0000 [IU] | Freq: Three times a day (TID) | INTRAMUSCULAR | Status: DC
  Administered 2023-05-16 (×3): 3 [IU] via SUBCUTANEOUS
  Administered 2023-05-17: 2 [IU] via SUBCUTANEOUS

## 2023-05-15 MED ORDER — ONDANSETRON HCL 4 MG/2ML IJ SOLN
INTRAMUSCULAR | Status: DC | PRN
  Administered 2023-05-15: 4 mg via INTRAVENOUS

## 2023-05-15 MED ORDER — OXYCODONE HCL 5 MG PO TABS: 5.0000 mg | ORAL_TABLET | Freq: Once | ORAL | Status: DC | PRN

## 2023-05-15 MED ORDER — CHLORHEXIDINE GLUCONATE 0.12 % MT SOLN: 15.0000 mL | Freq: Once | OROMUCOSAL | Status: AC

## 2023-05-15 MED ORDER — ALBUMIN HUMAN 5 % IV SOLN: INTRAVENOUS | Status: DC | PRN

## 2023-05-15 MED ORDER — AMISULPRIDE (ANTIEMETIC) 5 MG/2ML IV SOLN: 10.0000 mg | Freq: Once | INTRAVENOUS | Status: DC | PRN

## 2023-05-15 MED ORDER — BUPIVACAINE-EPINEPHRINE (PF) 0.25% -1:200000 IJ SOLN
INTRAMUSCULAR | Status: AC
  Filled 2023-05-15: qty 30

## 2023-05-15 MED ORDER — LACTATED RINGERS IV SOLN: INTRAVENOUS | Status: DC | PRN

## 2023-05-15 MED ORDER — BUPIVACAINE-EPINEPHRINE 0.25% -1:200000 IJ SOLN
INTRAMUSCULAR | Status: DC | PRN
  Administered 2023-05-15: 27 mL

## 2023-05-15 SURGICAL SUPPLY — 47 items
ADH SKN CLS APL DERMABOND .7 (GAUZE/BANDAGES/DRESSINGS) ×1
APL PRP STRL LF DISP 70% ISPRP (MISCELLANEOUS) ×1
APPLIER CLIP 5 13 M/L LIGAMAX5 (MISCELLANEOUS) ×1
APR CLP MED LRG 5 ANG JAW (MISCELLANEOUS) ×1
BAG COUNTER SPONGE SURGICOUNT (BAG) ×2 IMPLANT
BAG SPEC RTRVL 10 TROC 200 (ENDOMECHANICALS) ×1
BAG SPNG CNTER NS LX DISP (BAG) ×1
BLADE CLIPPER SURG (BLADE) IMPLANT
CANISTER SUCT 3000ML PPV (MISCELLANEOUS) ×2 IMPLANT
CHLORAPREP W/TINT 26 (MISCELLANEOUS) ×2 IMPLANT
CLIP APPLIE 5 13 M/L LIGAMAX5 (MISCELLANEOUS) ×2 IMPLANT
COVER SURGICAL LIGHT HANDLE (MISCELLANEOUS) ×2 IMPLANT
DERMABOND ADVANCED .7 DNX12 (GAUZE/BANDAGES/DRESSINGS) ×2 IMPLANT
ELECT REM PT RETURN 9FT ADLT (ELECTROSURGICAL) ×1
ELECTRODE REM PT RTRN 9FT ADLT (ELECTROSURGICAL) ×2 IMPLANT
GLOVE BIO SURGEON STRL SZ8 (GLOVE) ×2 IMPLANT
GLOVE BIOGEL PI IND STRL 8 (GLOVE) ×2 IMPLANT
GOWN STRL REUS W/ TWL LRG LVL3 (GOWN DISPOSABLE) ×4 IMPLANT
GOWN STRL REUS W/ TWL XL LVL3 (GOWN DISPOSABLE) ×2 IMPLANT
GOWN STRL REUS W/TWL LRG LVL3 (GOWN DISPOSABLE) ×2
GOWN STRL REUS W/TWL XL LVL3 (GOWN DISPOSABLE) ×1
HEMOSTAT SNOW SURGICEL 2X4 (HEMOSTASIS) IMPLANT
IRRIG SUCT STRYKERFLOW 2 WTIP (MISCELLANEOUS) ×1
IRRIGATION SUCT STRKRFLW 2 WTP (MISCELLANEOUS) ×2 IMPLANT
KIT BASIN OR (CUSTOM PROCEDURE TRAY) ×2 IMPLANT
KIT TURNOVER KIT B (KITS) ×2 IMPLANT
L-HOOK LAP DISP 36CM (ELECTROSURGICAL) ×1
LHOOK LAP DISP 36CM (ELECTROSURGICAL) ×2 IMPLANT
NDL 22X1.5 STRL (OR ONLY) (MISCELLANEOUS) ×2 IMPLANT
NEEDLE 22X1.5 STRL (OR ONLY) (MISCELLANEOUS) ×1 IMPLANT
NS IRRIG 1000ML POUR BTL (IV SOLUTION) ×2 IMPLANT
PAD ARMBOARD 7.5X6 YLW CONV (MISCELLANEOUS) ×2 IMPLANT
PENCIL BUTTON HOLSTER BLD 10FT (ELECTRODE) ×2 IMPLANT
POUCH RETRIEVAL ECOSAC 10 (ENDOMECHANICALS) ×2 IMPLANT
SCISSORS LAP 5X35 DISP (ENDOMECHANICALS) ×2 IMPLANT
SET TUBE SMOKE EVAC HIGH FLOW (TUBING) ×2 IMPLANT
SLEEVE Z-THREAD 5X100MM (TROCAR) ×4 IMPLANT
SPECIMEN JAR SMALL (MISCELLANEOUS) ×2 IMPLANT
SUT VIC AB 4-0 PS2 27 (SUTURE) ×2 IMPLANT
SUT VICRYL 0 UR6 27IN ABS (SUTURE) IMPLANT
TOWEL GREEN STERILE (TOWEL DISPOSABLE) ×2 IMPLANT
TOWEL GREEN STERILE FF (TOWEL DISPOSABLE) ×2 IMPLANT
TRAY LAPAROSCOPIC MC (CUSTOM PROCEDURE TRAY) ×2 IMPLANT
TROCAR BALLN 12MMX100 BLUNT (TROCAR) ×2 IMPLANT
TROCAR Z-THREAD OPTICAL 5X100M (TROCAR) ×2 IMPLANT
WARMER LAPAROSCOPE (MISCELLANEOUS) ×2 IMPLANT
WATER STERILE IRR 1000ML POUR (IV SOLUTION) ×2 IMPLANT

## 2023-05-15 NOTE — Discharge Instructions (Signed)

## 2023-05-15 NOTE — Consult Note (Signed)
[name] May 24, 1961  [id number].    Requesting MD: Dr. Beckey Downing Chief Complaint/Reason for Consult: cholecystitis  HPI:  [name] is a 62 yo male who presented to the ED with worsening epigastric abdominal pain. He has been having intermittent upper abdominal pain for the last few weeks, and was seen in the [facility] ED last week with upper abdominal pain radiating to the back. He had a CT scan and RUQ [name] which showed cholelithiasis without signs of cholecystitis. He was discharged and referred to a surgeon, who he was scheduled to see today, however yesterday he started having severe pain again. It is in the epigastric area and radiates through to the back. He has also had nausea and vomiting. It became unbearable and he presented to the [facility] ED. Labs showed a mild leukocytosis (WBC 13.6) with a normal lipase and LFTs. RUQ [name] was done and showed mild gallbladder wall thickening, concerning for cholecystitis. The patient was admitted to [facility].  Prior abdominal surgeries include a lap gastric band in 2011 and an open appendectomy many years ago. He has a history of diastolic CHF and nonobstructive CAD which is managed medically. LHC in February 2023 showed an LVEF of 50-55%. He recently saw his cardiologist on 9/30. He has some exertional dyspnea but no chest pain. He takes a baby aspirin but no other blood thinners.  ROS: Review of Systems  Constitutional:  Negative for chills and fever.  Respiratory:  Negative for shortness of breath.   Cardiovascular:  Negative for chest pain.  Gastrointestinal:  Positive for abdominal pain, nausea and vomiting.    Family History  Problem Relation Age of Onset   Cancer Father        skin   Heart disease Father 20       AMI first age 49; stents.   Diabetes Father    Hypertension Father    Hyperlipidemia Father    Peripheral Artery Disease Father    Parkinson's disease Father    Cancer Paternal Grandfather        skin   Diabetes Mother     Arthritis Mother        total hip replacement   Kidney failure Mother    Depression Sister    Diabetes Sister    Cancer Maternal Uncle        lymphoma   Prostate cancer Maternal Uncle    Colon cancer Neg Hx     Past Medical History:  Diagnosis Date   Allergic rhinitis, cause unspecified    Anxiety    Chronic diastolic CHF (congestive heart failure) (HCC)    a. 03/2017 Echo: EF 55%, Gr1DD, nl RV fxn.   Degeneration of intervertebral disc, site unspecified    Diabetes mellitus    Hyperlipidemia    Hypertension    Impotence of organic origin    Joint pain    Nonobstructive CAD    a. 03/2014 Cath: LM nl, LAD nl, D1 30ost/p, LCX nl, RCA 30d, EF 60%; b. 03/2017 MV: EF 56%, small, moderate basal inf/mid inf defect->prob inf thinning vs mild ischemia-->low-risk; c. 08/2021 Cath: LM nl, LAD 20ost/p, LCX large, 53m, RCA large, 30d, EF 50-55%. Nl filling pressures-->Med rx.   Obesity    Obesity, unspecified    Obstructive sleep apnea (adult) (pediatric)    Other chronic allergic conjunctivitis    Personal history of colonic polyps    SOB (shortness of breath)     Past Surgical History:  Procedure Laterality Date  admission  01/2009   [facility].  Syncopal event with nausea, diaphoresis, hypertension.  D-dimer elevated at 2.54 with negative CT chest. and VQ scan.  Cardiac enzymes negative x 3.  Labs normal.  Cardiology consult---cardiolite abn; cath, 2D-echo, holter monitor unrevealing.  SE H&V.   Allergy Consult  07/11/2010   SOB, itching, facial tingling, hypotension.  Allergy testing: no food allergies; +reaction to tree pollen, grass pollen, dust mites, mold.  Avoid diclofenac and similar products.  [name].   APPENDECTOMY     arthroscopy of knee  1980   BACK SURGERY     x3   CATARACT EXTRACTION Left    COLONOSCOPY  07/11/2009   single polyp; repeat in 5 years.  [name].   GI consult  07/11/2010   CT chest in ED (gastric thickening).  [name].  No EGD warranted due to lack of symptoms.    LAPAROSCOPIC GASTRIC BANDING  09/15/09   LEFT HEART CATHETERIZATION WITH CORONARY ANGIOGRAM N/A 04/02/2014   Procedure: LEFT HEART CATHETERIZATION WITH CORONARY ANGIOGRAM;  Surgeon: Lesleigh Noe, MD;  Location: [facility] CATH LAB;  Service: Cardiovascular;  Laterality: N/A;   QUADRICEPS REPAIR     left   QUADRICEPS TENDON REPAIR     left   RETINAL DETACHMENT SURGERY     RIGHT/LEFT HEART CATH AND CORONARY ANGIOGRAPHY N/A 08/13/2021   Procedure: RIGHT/LEFT HEART CATH AND CORONARY ANGIOGRAPHY;  Surgeon: Kathleene Hazel, MD;  Location: [facility] INVASIVE CV LAB;  Service: Cardiovascular;  Laterality: N/A;   ROTATOR CUFF REPAIR     right   Sleep study  07/11/2010   repeat sleep study due to weight loss.  Severe OSA; CPAP titration to 8 cm.     SPINE SURGERY     TONSILLECTOMY AND ADENOIDECTOMY  1960    Social History:  reports that he has quit smoking. His smoking use included cigarettes. He has a 0.5 pack-year smoking history. He has never used smokeless tobacco. He reports current alcohol use. He reports that he does not use drugs.  Allergies:  Allergies  Allergen Reactions   Diclofenac Sodium Shortness Of Breath, Itching and Other (See Comments)    Hypotension    Statins Shortness Of Breath   Voltaren [Diclofenac Sodium] Anaphylaxis   Lipitor [Atorvastatin]     Myalgias at 80mg  dose   Metoprolol     Lightheaded/dizzy/SOB.     Ramipril     Lightheaded/dizzy/SOB.      Medications Prior to Admission  Medication Sig Dispense Refill   albuterol (VENTOLIN HFA) 108 (90 Base) MCG/ACT inhaler Inhale 1-2 puffs into the lungs every 6 (six) hours as needed for wheezing or shortness of breath. 1 each 1   aspirin EC 81 MG tablet Take 81 mg by mouth daily.     Continuous Blood Gluc Receiver (DEXCOM G7 RECEIVER) DEVI by Does not apply route.     Continuous Blood Gluc Sensor (DEXCOM G7 SENSOR) MISC by Does not apply route.     cyclobenzaprine (FLEXERIL) 5 MG tablet TAKE ONE TABLET BY MOUTH EVERY NIGHT  AT BEDTIME 30 tablet 1   Dulaglutide (TRULICITY) 4.5 MG/0.5ML SOPN Inject 4.5 mg into the skin once a week.     Evolocumab (REPATHA SURECLICK) 140 MG/ML SOAJ Inject 140 mg into the skin every 14 (fourteen) days. 6 mL 3   ezetimibe (ZETIA) 10 MG tablet Take 1 tablet (10 mg total) by mouth daily. 90 tablet 3   FLUoxetine (PROZAC) 20 MG tablet TAKE ONE TABLET BY MOUTH ONCE  A DAY 90 tablet 1   gabapentin (NEURONTIN) 100 MG capsule TAKE ONE TO THREE CAPSULES BY MOUTH THREE TIMES DAILY AS NEEDED 270 capsule 0   HYDROcodone-acetaminophen (NORCO/VICODIN) 5-325 MG tablet Take 1-2 tablets by mouth at bedtime as needed for moderate pain. 10 tablet 0   insulin aspart (NOVOLOG FLEXPEN) 100 UNIT/ML FlexPen Per sliding scale     insulin degludec (TRESIBA) 200 UNIT/ML FlexTouch Pen Inject 32 Units into the skin every evening.     JARDIANCE 25 MG TABS tablet TAKE 1 TABLET BY MOUTH ONCE A DAY 30 tablet 2   metFORMIN (GLUCOPHAGE) 1000 MG tablet TAKE 1 TABLET (1,000 MG TOTAL) BY MOUTH 2 (TWO) TIMES DAILY WITH A MEAL. 180 tablet 2   Multiple Vitamin (MULTIVITAMIN) capsule Take 1 capsule by mouth daily.     nebivolol (BYSTOLIC) 5 MG tablet TAKE ONE TABLET (5 MG TOTAL) BY MOUTH DAILY. PLEASE KEEP SCHEDULED APPOINTMENT FOR FUTURE REFILLS. THANK YOU. 90 tablet 2   nitroGLYCERIN (NITROSTAT) 0.4 MG SL tablet Place 1 tablet (0.4 mg total) under the tongue every 5 (five) minutes as needed for chest pain. 25 tablet 3   omeprazole (PRILOSEC OTC) 20 MG tablet Take 1 tablet (20 mg total) by mouth daily. 90 tablet 3   ondansetron (ZOFRAN) 4 MG tablet Take 1 tablet (4 mg total) by mouth every 6 (six) hours. (Patient taking differently: Take 4 mg by mouth every 8 (eight) hours as needed.) 12 tablet 0   Vitamin D, Ergocalciferol, (DRISDOL) 1.25 MG (50000 UNIT) CAPS capsule Take 1 capsule (50,000 Units total) by mouth every 7 (seven) days. 13 capsule 0     Physical Exam: Blood pressure (!) 141/63, pulse 95, temperature 98.1 F (36.7  C), resp. rate 18, height 6\' 1"  (1.854 m), weight 126.3 kg, SpO2 96%. General: resting comfortably, appears stated age, no apparent distress Neurological: alert and oriented, no focal deficits, cranial nerves grossly in tact HEENT: normocephalic, atraumatic CV: extremities warm and well-perfused Respiratory: normal work of breathing on room air Abdomen: soft, nondistended, very mildly tender in epigastric area. Well-healed surgical scars. Extremities: warm and well-perfused, no deformities, moving all extremities spontaneously Psychiatric: normal mood and affect Skin: warm and dry, no jaundice, no rashes or lesions    Assessment/Plan 62 yo male presenting with acutely worsening epigastric pain, nausea and vomiting. I personally reviewed his labs, imaging and notes. Previous symptoms are consistent with biliary colic, and given his acutely worsening pain and leukocytosis, there is concern for acute cholecystitis. I reviewed the details of laparoscopic cholecystectomy with the patient, including the benefits and risks of bleeding, infection, post-cholecystectomy diarrhea, and <0.5% risk of common bile duct injury. He expressed understanding and agrees to proceed with surgery. - Please keep NPO for surgery - Antibiotics - Pain and nausea control - OR today with Dr. Rosanne Ashing, MD [facility] Surgery General, Hepatobiliary and Pancreatic Surgery 05/15/23 6:31 AM

## 2023-05-15 NOTE — Consult Note (Signed)
WOC consulted for buttock rash, patient has been in surgery all am.  Discussed with bedside nurse, reported  "it's been there for months and it randomly itches".  No open area at this time, no topical care recommended at this time.    Re consult if needed, will not follow at this time. Thanks  Ashlee Bewley [employer], RN,CWOCN, CNS, CWON-AP [phone])

## 2023-05-15 NOTE — Plan of Care (Signed)
  Problem: Education: Goal: Ability to describe self-care measures that may prevent or decrease complications (Diabetes Survival Skills Education) will improve Outcome: Progressing Goal: Individualized Educational Video(s) Outcome: Progressing   Problem: Coping: Goal: Ability to adjust to condition or change in health will improve Outcome: Progressing   Problem: Fluid Volume: Goal: Ability to maintain a balanced intake and output will improve Outcome: Progressing   Problem: Health Behavior/Discharge Planning: Goal: Ability to identify and utilize available resources and services will improve Outcome: Progressing Goal: Ability to manage health-related needs will improve Outcome: Progressing   Problem: Metabolic: Goal: Ability to maintain appropriate glucose levels will improve Outcome: Progressing   Problem: Nutritional: Goal: Maintenance of adequate nutrition will improve Outcome: Progressing Goal: Progress toward achieving an optimal weight will improve Outcome: Progressing   Problem: Skin Integrity: Goal: Risk for impaired skin integrity will decrease Outcome: Progressing   Problem: Tissue Perfusion: Goal: Adequacy of tissue perfusion will improve Outcome: Progressing   Problem: Education: Goal: Knowledge of General Education information will improve Description: Including pain rating scale, medication(s)/side effects and non-pharmacologic comfort measures Outcome: Progressing   Problem: Health Behavior/Discharge Planning: Goal: Ability to manage health-related needs will improve Outcome: Progressing   Problem: Clinical Measurements: Goal: Ability to maintain clinical measurements within normal limits will improve Outcome: Progressing Goal: Will remain free from infection Outcome: Progressing Goal: Diagnostic test results will improve Outcome: Progressing Goal: Respiratory complications will improve Outcome: Progressing Goal: Cardiovascular complication will  be avoided Outcome: Progressing   Problem: Activity: Goal: Risk for activity intolerance will decrease Outcome: Progressing   Problem: Nutrition: Goal: Adequate nutrition will be maintained Outcome: Progressing   Problem: Coping: Goal: Level of anxiety will decrease Outcome: Progressing   Problem: Elimination: Goal: Will not experience complications related to bowel motility Outcome: Progressing Goal: Will not experience complications related to urinary retention Outcome: Progressing   Problem: Pain Management: Goal: General experience of comfort will improve Outcome: Progressing   Problem: Safety: Goal: Ability to remain free from injury will improve Outcome: Progressing   Problem: Skin Integrity: Goal: Risk for impaired skin integrity will decrease Outcome: Progressing   Problem: Fluid Volume: Goal: Hemodynamic stability will improve Outcome: Progressing   Problem: Clinical Measurements: Goal: Diagnostic test results will improve Outcome: Progressing Goal: Signs and symptoms of infection will decrease Outcome: Progressing   Problem: Respiratory: Goal: Ability to maintain adequate ventilation will improve Outcome: Progressing

## 2023-05-15 NOTE — Progress Notes (Signed)
[facility] Progress Note  Patient: [name]    [name]:[id number]  DOA: 05/14/2023     Date of Service: the patient was seen and examined on 05/15/2023  Chief Complaint  Patient presents with   Abdominal Pain    Epigastric/ gallbladder   Brief hospital course: [name] is a 62 y.o. male with medical history significant for hypertension, hyperlipidemia, type 2 diabetes mellitus, nonobstructive CAD, chronic diastolic CHF, and obesity with history of gastric banding in 2011 who presents with abdominal pain, nausea, and dry heaves.    Patient has had similar episodes recently that were self-limited. He was seen in the emergency department on 05/09/2023 with similar complaints, was found to have cholelithiasis without evidence for acute cholecystitis on [name], and he was discharged home.     He had been referred to a surgeon for evaluation of this but had a particularly severe episode this afternoon with severe pain, nausea, and dry heaves that prompted his presentation to the ED.  Pain is severe and radiating from upper abdomen through to his back. He has had subjective fevers at home. He denies any chest pain or SOB.     [facility] ED Course: Upon arrival to the ED, patient is found to be afebrile initially with normal heart rate and stable blood pressure, later developing fever and tachycardia.  Labs are most notable for WBC 13,600.  Ultrasound reveals cholelithiasis with mild gallbladder wall thickening suspicious for acute cholecystitis.   Surgery (Dr. Freida Busman) was consulted by the ED physician, patient was treated with morphine, Zofran, and Zosyn, and he was transferred to [facility] for admission.   Assessment and Plan:  Acute calculus cholecystitis  S/p IVF hydration continue pain-control, and Zosyn;  Consulted, s/p lap chole done on 05/15/2023 Patient was started on heart healthy diet, start Lovenox after 24 hours due to risk of bleeding Follow general  surgery for further recommendation and discharge planning most likely tomorrow a.m. Continue Zosyn for now, pharmacy consulted for dosing  # CAD  - Mild non-obstructive CAD on cath in February 2023  - No angina     # Chronic diastolic CHF  - Appears compensated   - Monitor weight and I/Os     # Type II DM  - A1c was 8.1% in July 2024  - Check CBGs and use SSI only for now     # Hypertension  - Treat as-needed only for now     Body mass index is 36.74 kg/m.  Interventions:  Diet: Heart healthy diet DVT Prophylaxis: Subcutaneous Lovenox SCD  Advance goals of care discussion: Full code  Family Communication: family was not present at bedside, at the time of interview.  The pt provided permission to discuss medical plan with the family. Opportunity was given to ask question and all questions were answered satisfactorily.   Disposition:  Pt is from Home, admitted with acute cholecystitis, status post lap chole done on 11/4, which precludes a safe discharge. Discharge to Home, when stable and cleared by general surgery, most likely tomorrow a.m.  Subjective: No significant overnight events, patient was seen after lap chole, tolerated procedure well.  Complaining of mild soreness at the surgical site, right shoulder pain, no any other complaints.  Physical Exam: General: NAD, lying comfortably Appear in no distress, affect appropriate Eyes: PERRLA ENT: Oral Mucosa Clear, moist  Neck: no JVD,  Cardiovascular: S1 and S2 Present, no Murmur,  Respiratory: good respiratory effort, Bilateral Air entry equal and  Decreased, no Crackles, no wheezes Abdomen: Bowel Sound present, Soft and mild Post op tenderness,  Skin: no rashes Extremities: no Pedal edema, no calf tenderness Neurologic: without any new focal findings Gait not checked due to patient safety concerns  Vitals:   05/15/23 1105 05/15/23 1115 05/15/23 1130 05/15/23 1150  BP: (!) 147/60 (!) 154/45 (!) 158/62 (!) 156/74   Pulse: 99 92 93 92  Resp: 12 12 12 14   Temp: 97.8 F (36.6 C)   97.6 F (36.4 C)  TempSrc:    Oral  SpO2: 94% 98% 95% 96%  Weight:      Height:        Intake/Output Summary (Last 24 hours) at 05/15/2023 1236 Last data filed at 05/15/2023 1038 Gross per 24 hour  Intake 1749.86 ml  Output 125 ml  Net 1624.86 ml   Filed Weights   05/14/23 2211 05/15/23 0551  Weight: 126.2 kg 126.3 kg    Data Reviewed: I have personally reviewed and interpreted daily labs, tele strips, imagings as discussed above. I reviewed all nursing notes, pharmacy notes, vitals, pertinent old records I have discussed plan of care as described above with RN and patient/family.  CBC: Recent Labs  Lab 05/09/23 0900 05/14/23 1801 05/15/23 0650  WBC 12.9* 13.6* 9.3  NEUTROABS  --  9.2*  --   HGB 15.4 14.7 13.7  HCT 45.6 43.1 41.7  MCV 87.5 87.2 88.9  PLT 351 371 333   Basic Metabolic Panel: Recent Labs  Lab 05/09/23 0900 05/14/23 1801 05/15/23 0650  NA 138 136 137  K 4.8 4.2 4.3  CL 104 96* 103  CO2 26 25 24   GLUCOSE 224* 169* 110*  BUN 15 15 13   CREATININE 0.95 0.94 1.03  CALCIUM 9.0 10.2 8.9    Studies: US Abdomen Limited RUQ (LIVER/GB)  Result Date: 05/14/2023 CLINICAL DATA:  Right upper quadrant pain EXAM: ULTRASOUND ABDOMEN LIMITED RIGHT UPPER QUADRANT COMPARISON:  05/09/2023 FINDINGS: Gallbladder: Moderately distended gallbladder containing sludge and multiple stones. Largest stone identified measures 8 mm in diameter. Mild gallbladder wall thickening at approximately 5 mm. Murphy sign unable to be adequately assessed as patient has been medicated. Common bile duct: Diameter: 2 mm. Liver: No focal lesion identified. Within normal limits in parenchymal echogenicity. Portal vein is patent on color Doppler imaging with normal direction of blood flow towards the liver. Other: None. IMPRESSION: Cholelithiasis with mild gallbladder wall thickening. Findings are suspicious for acute  cholecystitis. Electronically Signed   By: Duanne Guess D.O.   On: 05/14/2023 19:13    Scheduled Meds:  insulin aspart  0-6 Units Subcutaneous Q4H   Continuous Infusions:  sodium chloride Stopped (05/15/23 0933)   piperacillin-tazobactam (ZOSYN)  IV 3.375 g (05/15/23 1206)   PRN Meds: acetaminophen **OR** acetaminophen, HYDROmorphone (DILAUDID) injection, ondansetron (ZOFRAN) IV, oxyCODONE  Time spent: 35 minutes  Author: Gillis Santa. MD [facility] 05/15/2023 12:36 PM  To reach On-call, see care teams to locate the attending and reach out to them via [email or web address]. If 7PM-7AM, please contact night-coverage If you still have difficulty reaching the attending provider, please page the [facility] (Director on Call) for [facility] on amion for assistance.

## 2023-05-15 NOTE — Anesthesia Procedure Notes (Signed)
Procedure Name: Intubation Date/Time: 05/15/2023 9:28 AM  Performed by: Lannie Fields, DOPre-anesthesia Checklist: Patient identified, Emergency Drugs available, Suction available and Patient being monitored Patient Re-evaluated:Patient Re-evaluated prior to induction Oxygen Delivery Method: Circle system utilized Preoxygenation: Pre-oxygenation with 100% oxygen Induction Type: IV induction Ventilation: Mask ventilation with difficulty, Two handed mask ventilation required and Oral airway inserted - appropriate to patient size Laryngoscope Size: Glidescope and 3 Tube type: Oral Tube size: 7.5 mm Number of attempts: 2 Airway Equipment and Method: Stylet and Oral airway Placement Confirmation: ETT inserted through vocal cords under direct vision, positive ETCO2 and breath sounds checked- equal and bilateral Secured at: 24 cm Tube secured with: Tape Dental Injury: Teeth and Oropharynx as per pre-operative assessment  Difficulty Due To: Difficult Airway- due to anterior larynx, Difficult Airway- due to dentition and Difficult Airway- due to large tongue Comments: DLX1 crna unable to pass ETT, DL#2 MDA ETT passed blindly to esophagus. Mask ventilated w/ oral airway and 2 person ventilation required. Easy Glidescope intubation, recommend direct to glidescope in the future.

## 2023-05-15 NOTE — Anesthesia Preprocedure Evaluation (Addendum)
Anesthesia Evaluation  Patient identified by MRN, date of birth, ID band Patient awake    Reviewed: Allergy & Precautions, H&P , NPO status , Patient's Chart, lab work & pertinent test results, reviewed documented beta blocker date and time   Airway Mallampati: III  TM Distance: >3 FB Neck ROM: Full    Dental  (+) Missing, Dental Advisory Given,    Pulmonary sleep apnea (had repeat sleep study 1 year ago and did not need mask) , former smoker   Pulmonary exam normal breath sounds clear to auscultation       Cardiovascular hypertension (148/75 preop, per pt normally slightly lower), Pt. on medications and Pt. on home beta blockers + CAD (nonobstructive on cath 2023) and +CHF (LVEF 55% on echo 2018, grade 1 diastolic dysfunction)  Normal cardiovascular exam Rhythm:Regular Rate:Normal  echo 2018 - Left ventricle: The cavity size was normal. Wall thickness was    normal. The estimated ejection fraction was 55%. Wall motion was    normal; there were no regional wall motion abnormalities. Doppler    parameters are consistent with abnormal left ventricular    relaxation (grade 1 diastolic dysfunction).  - Aortic valve: There was no stenosis.  - Mitral valve: There was no significant regurgitation.  - Right ventricle: The cavity size was normal. Systolic function    was normal.  - Pulmonary arteries: No complete TR doppler jet so unable to    estimate PA systolic pressure.  - Inferior vena cava: The vessel was normal in size. The    respirophasic diameter changes were in the normal range (= 50%),    consistent with normal central venous pressure.   Cath 2023   Dist RCA lesion is 30% stenosed.   Mid Cx lesion is 30% stenosed.   Ost LAD to Prox LAD lesion is 20% stenosed.   The left ventricular systolic function is normal.   LV end diastolic pressure is normal.   The left ventricular ejection fraction is 50-55% by visual  estimate.   There is no mitral valve regurgitation.   Normal right and left heart pressures Mild non-obstructive CAD Non-cardiac chest pain.  Normal LV function   Recommendations: Medical management of mild CAD. Will encourage exercise and weight loss. Dyspnea likely due to deconditioning.       Neuro/Psych  PSYCHIATRIC DISORDERS Anxiety     negative neurological ROS     GI/Hepatic Neg liver ROS,GERD  Medicated and Controlled,,Cholecystitis S/p gastric band   Endo/Other  diabetes, Well Controlled, Type 2, Insulin Dependent, Oral Hypoglycemic Agents  Obesity BMI 37 A1c 8.6 FS 107 in preop  Renal/GU negative Renal ROS  negative genitourinary   Musculoskeletal  (+) Arthritis , Osteoarthritis,    Abdominal  (+) + obese  Peds negative pediatric ROS (+)  Hematology negative hematology ROS (+)   Anesthesia Other Findings Trulicity LD: >7d  Reproductive/Obstetrics negative OB ROS                             Anesthesia Physical Anesthesia Plan  ASA: 3  Anesthesia Plan: General   Post-op Pain Management: Tylenol PO (pre-op)*   Induction: Intravenous  PONV Risk Score and Plan: 3 and Ondansetron, Dexamethasone, Midazolam and Treatment may vary due to age or medical condition  Airway Management Planned: Oral ETT  Additional Equipment: None  Intra-op Plan:   Post-operative Plan: Extubation in OR  Informed Consent: I have reviewed the patients History and Physical,  chart, labs and discussed the procedure including the risks, benefits and alternatives for the proposed anesthesia with the patient or authorized representative who has indicated his/her understanding and acceptance.     Dental advisory given  Plan Discussed with: CRNA  Anesthesia Plan Comments:        Anesthesia Quick Evaluation

## 2023-05-15 NOTE — Op Note (Signed)
  05/15/2023  11:01 AM  PATIENT:  [name]  62 y.o. male  PRE-OPERATIVE DIAGNOSIS:  Cholecystitis  POST-OPERATIVE DIAGNOSIS:  Cholecystitis, empyema of the gallbladder  PROCEDURE:  Procedure(s): LAPAROSCOPIC CHOLECYSTECTOMY  SURGEON:  Surgeon(s): Violeta Gelinas, MD  ASSISTANTS: none   ANESTHESIA:   local and general  EBL:  Total I/O In: 950 [I.V.:700; IV Piggyback:250] Out: 125 [Other:100; Blood:25]  BLOOD ADMINISTERED:none  DRAINS: none   SPECIMEN:  Excision  DISPOSITION OF SPECIMEN:  PATHOLOGY  COUNTS:  YES  DICTATION: .Dragon Dictation Findings: Severe cholecystitis with empyema of the gallbladder  Procedure detail: Informed consent was obtained.  He is on IV antibiotics.  He was brought to the operating and general endotracheal anesthesia was administered by the anesthesia staff.  His abdomen was prepped and draped in a sterile fashion.  Timeout procedure was performed.The infraumbilical region was infiltrated with local. Infraumbilical incision was made. Subcutaneous tissues were dissected down revealing the anterior fascia. This was divided sharply along the midline. Peritoneal cavity was entered under direct vision without complication. I avoided his lap band throughout the case. A 0 Vicryl pursestring was placed around the fascial opening. Hassan trocar was inserted into the abdomen. The abdomen was insufflated with carbon dioxide in standard fashion. Under direct vision a 5 mm epigastric and 5 mm right abdominal port x 2 were placed.  Local was used at each port site.  Laparoscopic exploration revealed the gallbladder was encased in omentum.  This was gently peeled away with feeling the dome of the body of the gallbladder.  This was then retracted superiorly and medially and further omentum was swept away from the infundibulum.  The infundibulum was retracted inferior and laterally.  Dissection began laterally and progressed medially identifying the cystic duct and  the cystic artery.  Once we had a critical view of safety of the cystic duct 3 clips were placed proximally on it and 1 clip was placed distally.  It was divided.  2 clips were placed proximally on the cystic artery and it was divided distally with cautery.  The gallbladder was taken off the liver bed with cautery.  It was quite adherent to the liver bed several areas were cauterized in order to get good hemostasis.  The gallbladder was placed in a bag and removed from the abdomen.  Removing it from the umbilical site required enlarging that port site through the fascia.  The gallbladder was sent to pathology.  The liver bed was copiously irrigated and hemostasis was obtained with cautery.  I also placed a piece of Surgicel snow.  The liver bed then looked dry.  Remainder the irrigation was evacuated.  Four-quadrant inspection revealed no complications.  Ports were removed under direct vision.  Pneumoperitoneum was released.  The infraumbilical fascia was closed with multiple additional 0 Vicryl sutures in order to get a good closure.  Once this was accomplished, all wounds were irrigated and the skin of each was closed with 4-0 Vicryl followed by Dermabond.  All counts were correct.  He tolerated the procedure well without apparent complication and was taken recovery in stable condition.  PATIENT DISPOSITION:  PACU - hemodynamically stable.   Delay start of Pharmacological VTE agent (>24hrs) due to surgical blood loss or risk of bleeding:  no  Violeta Gelinas, MD, MPH, FACS Pager: [phone]  11/4/202411:01 AM

## 2023-05-15 NOTE — Transitions of Care (Post Inpatient/ED Visit) (Signed)
On 05/10/23 unable to reach pt by phone and left v/m requesting cb [phone]. Per chart review tab pt was admitted to [facility] ED on 05/14/23. Sending note to Dr Para March as Lorain Childes to PCP.      05/15/2023  Name: [name] MRN: [id number] DOB: 1960/09/11  Today's TOC FU Call Status: Today's TOC FU Call Status:: Unsuccessful Call (1st Attempt) Unsuccessful Call (1st Attempt) Date: 05/10/23 (I spoke with pt and he was going in for card appt and pt will try to cb later today or tomorrow and if he does not I am to try to call him backl.)  Attempted to reach the patient regarding the most recent Inpatient/ED visit.  Follow Up Plan: No further outreach attempts will be made at this time. We have been unable to contact the patient.  Signature Lewanda Rife, LPN

## 2023-05-15 NOTE — Progress Notes (Signed)
Mobility Specialist Progress Note:   05/15/23 1405  Mobility  Activity Ambulated with assistance in hallway  Level of Assistance Modified independent, requires aide device or extra time  Assistive Device Other (Comment) (IV pole)  Distance Ambulated (ft) 70 ft  Activity Response Tolerated well  Mobility Referral Yes  $Mobility charge 1 Mobility  Mobility Specialist Start Time (ACUTE ONLY) 1405  Mobility Specialist Stop Time (ACUTE ONLY) 1415  Mobility Specialist Time Calculation (min) (ACUTE ONLY) 10 min   Pt received in bed, agreeable to mobility session. Used ModI with IV pole during session. Tolerated well, asx throughout. Returned to room, all needs met, call bell in reach.   Feliciana Rossetti Mobility Specialist Please contact via Special educational needs teacher or  Rehab office at [phone]

## 2023-05-15 NOTE — Transfer of Care (Signed)
Immediate Anesthesia Transfer of Care Note  Patient: [name]  Procedure(s) Performed: LAPAROSCOPIC CHOLECYSTECTOMY  Patient Location: PACU  Anesthesia Type:General  Level of Consciousness: awake  Airway & Oxygen Therapy: Patient Spontanous Breathing  Post-op Assessment: Report given to RN and Post -op Vital signs reviewed and stable  Post vital signs: Reviewed and stable  Last Vitals:  Vitals Value Taken Time  BP 147/60 05/15/23 1106  Temp    Pulse 97 05/15/23 1108  Resp 21 05/15/23 1108  SpO2 95 % 05/15/23 1108  Vitals shown include unfiled device data.  Last Pain:  Vitals:   05/15/23 0824  TempSrc:   PainSc: 0-No pain      Patients Stated Pain Goal: 2 (05/15/23 0824)  Complications: No notable events documented.

## 2023-05-15 NOTE — Anesthesia Postprocedure Evaluation (Signed)
Anesthesia Post Note  Patient: [name]  Procedure(s) Performed: LAPAROSCOPIC CHOLECYSTECTOMY     Patient location during evaluation: PACU Anesthesia Type: General Level of consciousness: awake and alert, oriented and patient cooperative Pain management: pain level controlled Vital Signs Assessment: post-procedure vital signs reviewed and stable Respiratory status: spontaneous breathing, nonlabored ventilation and respiratory function stable Cardiovascular status: blood pressure returned to baseline and stable Postop Assessment: no apparent nausea or vomiting Anesthetic complications: no   No notable events documented.  Last Vitals:  Vitals:   05/15/23 0724 05/15/23 1105  BP: (!) 148/75 (!) 147/60  Pulse: 95   Resp: 16   Temp: 36.9 C 36.6 C  SpO2: 97%     Last Pain:  Vitals:   05/15/23 1105  TempSrc:   PainSc: Asleep                 Lannie Fields

## 2023-05-15 NOTE — Progress Notes (Signed)
   Subjective/Chief Complaint: RUQ pain has eased some after medication   Objective: Vital signs in last 24 hours: Temp:  [97.7 F (36.5 C)-100.4 F (38 C)] 98.4 F (36.9 C) (11/04 0724) Pulse Rate:  [81-127] 95 (11/04 0724) Resp:  [16-20] 16 (11/04 0724) BP: (124-194)/(63-86) 148/75 (11/04 0724) SpO2:  [94 %-100 %] 97 % (11/04 0724) Weight:  [126.2 kg-126.3 kg] 126.3 kg (11/04 0551) Last BM Date : 05/14/23  Intake/Output from previous day: 11/03 0701 - 11/04 0700 In: 799.9 [I.V.:737.8; IV Piggyback:62.1] Out: -  Intake/Output this shift: No intake/output data recorded.  General appearance: alert and cooperative Resp: clear to auscultation bilaterally GI: mild RUQ tenderness Extremities: calves soft  Lab Results:  Recent Labs    05/14/23 1801 05/15/23 0650  WBC 13.6* 9.3  HGB 14.7 13.7  HCT 43.1 41.7  PLT 371 333   BMET Recent Labs    05/14/23 1801  NA 136  K 4.2  CL 96*  CO2 25  GLUCOSE 169*  BUN 15  CREATININE 0.94  CALCIUM 10.2   PT/INR Recent Labs    05/14/23 2250  LABPROT 14.1  INR 1.1   ABG No results for input(s): "PHART", "HCO3" in the last 72 hours.  Invalid input(s): "PCO2", "PO2"  Studies/Results: US Abdomen Limited RUQ (LIVER/GB)  Result Date: 05/14/2023 CLINICAL DATA:  Right upper quadrant pain EXAM: ULTRASOUND ABDOMEN LIMITED RIGHT UPPER QUADRANT COMPARISON:  05/09/2023 FINDINGS: Gallbladder: Moderately distended gallbladder containing sludge and multiple stones. Largest stone identified measures 8 mm in diameter. Mild gallbladder wall thickening at approximately 5 mm. Murphy sign unable to be adequately assessed as patient has been medicated. Common bile duct: Diameter: 2 mm. Liver: No focal lesion identified. Within normal limits in parenchymal echogenicity. Portal vein is patent on color Doppler imaging with normal direction of blood flow towards the liver. Other: None. IMPRESSION: Cholelithiasis with mild gallbladder wall  thickening. Findings are suspicious for acute cholecystitis. Electronically Signed   By: Duanne Guess D.O.   On: 05/14/2023 19:13    Anti-infectives: Anti-infectives (From admission, onward)    Start     Dose/Rate Route Frequency Ordered Stop   05/15/23 0300  piperacillin-tazobactam (ZOSYN) IVPB 3.375 g        3.375 g 12.5 mL/hr over 240 Minutes Intravenous Every 8 hours 05/14/23 2035     05/14/23 1945  piperacillin-tazobactam (ZOSYN) IVPB 3.375 g        3.375 g 100 mL/hr over 30 Minutes Intravenous  Once 05/14/23 1945 05/14/23 2055       Assessment/Plan: Acute cholecystitis - IV Zosyn. To OR for laparoscopic cholecystectomy. Procedure, risks, and benefits explained and he agrees.   LOS: 1 day    Liz Malady 05/15/2023

## 2023-05-16 ENCOUNTER — Encounter (HOSPITAL_COMMUNITY): Payer: Self-pay | Admitting: General Surgery

## 2023-05-16 DIAGNOSIS — K810 Acute cholecystitis: Secondary | ICD-10-CM | POA: Diagnosis not present

## 2023-05-16 LAB — GLUCOSE, CAPILLARY
Glucose-Capillary: 161 mg/dL — ABNORMAL HIGH (ref 70–99)
Glucose-Capillary: 168 mg/dL — ABNORMAL HIGH (ref 70–99)
Glucose-Capillary: 170 mg/dL — ABNORMAL HIGH (ref 70–99)
Glucose-Capillary: 178 mg/dL — ABNORMAL HIGH (ref 70–99)
Glucose-Capillary: 186 mg/dL — ABNORMAL HIGH (ref 70–99)
Glucose-Capillary: 188 mg/dL — ABNORMAL HIGH (ref 70–99)
Glucose-Capillary: 189 mg/dL — ABNORMAL HIGH (ref 70–99)
Glucose-Capillary: 197 mg/dL — ABNORMAL HIGH (ref 70–99)

## 2023-05-16 LAB — CBC
HCT: 40 % (ref 39.0–52.0)
Hemoglobin: 12.8 g/dL — ABNORMAL LOW (ref 13.0–17.0)
MCH: 29.2 pg (ref 26.0–34.0)
MCHC: 32 g/dL (ref 30.0–36.0)
MCV: 91.3 fL (ref 80.0–100.0)
Platelets: 381 10*3/uL (ref 150–400)
RBC: 4.38 MIL/uL (ref 4.22–5.81)
RDW: 13.1 % (ref 11.5–15.5)
WBC: 11.6 10*3/uL — ABNORMAL HIGH (ref 4.0–10.5)
nRBC: 0 % (ref 0.0–0.2)

## 2023-05-16 LAB — HEPATIC FUNCTION PANEL
ALT: 34 U/L (ref 0–44)
AST: 45 U/L — ABNORMAL HIGH (ref 15–41)
Albumin: 2.8 g/dL — ABNORMAL LOW (ref 3.5–5.0)
Alkaline Phosphatase: 92 U/L (ref 38–126)
Bilirubin, Direct: 0.2 mg/dL (ref 0.0–0.2)
Indirect Bilirubin: 1.5 mg/dL — ABNORMAL HIGH (ref 0.3–0.9)
Total Bilirubin: 1.7 mg/dL — ABNORMAL HIGH (ref <1.2)
Total Protein: 6.6 g/dL (ref 6.5–8.1)

## 2023-05-16 LAB — LIPASE, BLOOD: Lipase: 19 U/L (ref 11–51)

## 2023-05-16 LAB — MAGNESIUM: Magnesium: 1.8 mg/dL (ref 1.7–2.4)

## 2023-05-16 LAB — BASIC METABOLIC PANEL
Anion gap: 17 — ABNORMAL HIGH (ref 5–15)
BUN: 22 mg/dL (ref 8–23)
CO2: 14 mmol/L — ABNORMAL LOW (ref 22–32)
Calcium: 8.4 mg/dL — ABNORMAL LOW (ref 8.9–10.3)
Chloride: 102 mmol/L (ref 98–111)
Creatinine, Ser: 1.3 mg/dL — ABNORMAL HIGH (ref 0.61–1.24)
GFR, Estimated: >60 mL/min (ref >60)
Glucose, Bld: 181 mg/dL — ABNORMAL HIGH (ref 70–99)
Potassium: 4.6 mmol/L (ref 3.5–5.1)
Sodium: 133 mmol/L — ABNORMAL LOW (ref 135–145)

## 2023-05-16 LAB — SURGICAL PATHOLOGY

## 2023-05-16 LAB — PHOSPHORUS: Phosphorus: 4 mg/dL (ref 2.5–4.6)

## 2023-05-16 MED ORDER — SODIUM CHLORIDE 0.45 % IV SOLN
INTRAVENOUS | Status: DC
  Filled 2023-05-16 (×2): qty 75

## 2023-05-16 MED ORDER — OXYCODONE HCL 5 MG PO TABS: 5.0000 mg | ORAL_TABLET | ORAL | 0 refills | Status: DC | PRN

## 2023-05-16 NOTE — Progress Notes (Signed)
[facility] Progress Note  Patient: [name]    WUJ:[id number]  DOA: 05/14/2023     Date of Service: the patient was seen and examined on 05/16/2023  Chief Complaint  Patient presents with   Abdominal Pain    Epigastric/ gallbladder   Brief hospital course: [name] is a 62 y.o. male with medical history significant for hypertension, hyperlipidemia, type 2 diabetes mellitus, nonobstructive CAD, chronic diastolic CHF, and obesity with history of gastric banding in 2011 who presents with abdominal pain, nausea, and dry heaves.    Patient has had similar episodes recently that were self-limited. He was seen in the emergency department on 05/09/2023 with similar complaints, was found to have cholelithiasis without evidence for acute cholecystitis on [name], and he was discharged home.     He had been referred to a surgeon for evaluation of this but had a particularly severe episode this afternoon with severe pain, nausea, and dry heaves that prompted his presentation to the ED.  Pain is severe and radiating from upper abdomen through to his back. He has had subjective fevers at home. He denies any chest pain or SOB.     [facility] ED Course: Upon arrival to the ED, patient is found to be afebrile initially with normal heart rate and stable blood pressure, later developing fever and tachycardia.  Labs are most notable for WBC 13,600.  Ultrasound reveals cholelithiasis with mild gallbladder wall thickening suspicious for acute cholecystitis.   Surgery (Dr. Freida Busman) was consulted by the ED physician, patient was treated with morphine, Zofran, and Zosyn, and he was transferred to [facility] for admission.   Assessment and Plan:  Acute calculus cholecystitis  S/p IVF hydration continue pain-control, and Zosyn;  General Surgery consulted, s/p lap chole done on 05/15/2023 Patient was started on heart healthy diet, start Lovenox after 24 hours due to risk of  bleeding Patient is cleared by general surgery to discharge and follow-up as an outpatient. Continue Zosyn for now, pharmacy consulted for dosing.  No need of antibiotics on discharge as per general surgery  # Metabolic acidosis, CO2 14 Creatinine 1.3 slightly elevated Started bicarb IV infusion Monitor electrolytes daily   # CAD  - Mild non-obstructive CAD on cath in February 2023  - No angina     # Chronic diastolic CHF  - Appears compensated   - Monitor weight and I/Os     # Type II DM  - A1c was 8.1% in July 2024  - Check CBGs and use SSI only for now     # Hypertension  - Treat as-needed only for now     Body mass index is 36.15 kg/m.  Interventions:  Diet: Heart healthy diet DVT Prophylaxis: Subcutaneous Lovenox SCD  Advance goals of care discussion: Full code  Family Communication: family was not present at bedside, at the time of interview.  The pt provided permission to discuss medical plan with the family. Opportunity was given to ask question and all questions were answered satisfactorily.   Disposition:  Pt is from Home, admitted with acute cholecystitis, status post lap chole done on 11/4, which precludes a safe discharge. Discharge to Home, when stable and cleared by general surgery, most likely tomorrow a.m.  Subjective: No significant overnight events, denies any abdominal pain, tolerated diet well.  No nausea vomiting or diarrhea.  No chest pain or shortness of breath, no any complaints.   Physical Exam: General: NAD, lying comfortably Appear in no distress,  affect appropriate Eyes: PERRLA ENT: Oral Mucosa Clear, moist  Neck: no JVD,  Cardiovascular: S1 and S2 Present, no Murmur,  Respiratory: good respiratory effort, Bilateral Air entry equal and Decreased, no Crackles, no wheezes Abdomen: Bowel Sound present, Soft and mild Post op tenderness,  Skin: no rashes Extremities: no Pedal edema, no calf tenderness Neurologic: without any new focal  findings Gait not checked due to patient safety concerns  Vitals:   05/15/23 1928 05/16/23 0419 05/16/23 0452 05/16/23 0722  BP: 130/67 (!) 121/48  (!) 103/43  Pulse:  (!) 103  (!) 104  Resp: 18 18    Temp: 98.4 F (36.9 C) 98.1 F (36.7 C)  97.8 F (36.6 C)  TempSrc: Oral Oral  Oral  SpO2: 97% 96%  97%  Weight:   124.3 kg   Height:        Intake/Output Summary (Last 24 hours) at 05/16/2023 1246 Last data filed at 05/16/2023 1308 Gross per 24 hour  Intake 1117.71 ml  Output 2000 ml  Net -882.29 ml   Filed Weights   05/14/23 2211 05/15/23 0551 05/16/23 0452  Weight: 126.2 kg 126.3 kg 124.3 kg    Data Reviewed: I have personally reviewed and interpreted daily labs, tele strips, imagings as discussed above. I reviewed all nursing notes, pharmacy notes, vitals, pertinent old records I have discussed plan of care as described above with RN and patient/family.  CBC: Recent Labs  Lab 05/14/23 1801 05/15/23 0650 05/16/23 0532  WBC 13.6* 9.3 11.6*  NEUTROABS 9.2*  --   --   HGB 14.7 13.7 12.8*  HCT 43.1 41.7 40.0  MCV 87.2 88.9 91.3  PLT 371 333 381   Basic Metabolic Panel: Recent Labs  Lab 05/14/23 1801 05/15/23 0650 05/16/23 0532  NA 136 137 133*  K 4.2 4.3 4.6  CL 96* 103 102  CO2 25 24 14*  GLUCOSE 169* 110* 181*  BUN 15 13 22   CREATININE 0.94 1.03 1.30*  CALCIUM 10.2 8.9 8.4*  MG  --   --  1.8  PHOS  --   --  4.0    Studies: No results found.  Scheduled Meds:  enoxaparin (LOVENOX) injection  40 mg Subcutaneous QPM   insulin aspart  0-15 Units Subcutaneous TID WC   Continuous Infusions:  sodium chloride 50 mL/hr at 05/16/23 0510   piperacillin-tazobactam (ZOSYN)  IV 3.375 g (05/16/23 1043)   sodium bicarbonate 75 mEq in sodium chloride 0.45 % 1,075 mL infusion 75 mL/hr at 05/16/23 1042   PRN Meds: acetaminophen **OR** acetaminophen, HYDROmorphone (DILAUDID) injection, ondansetron (ZOFRAN) IV, oxyCODONE  Time spent: 55 minutes  Author: Gillis Santa. MD [facility] 05/16/2023 12:46 PM  To reach On-call, see care teams to locate the attending and reach out to them via [email or web address]. If 7PM-7AM, please contact night-coverage If you still have difficulty reaching the attending provider, please page the [facility] (Director on Call) for [facility] on amion for assistance.

## 2023-05-16 NOTE — Plan of Care (Signed)
  Problem: Coping: Goal: Ability to adjust to condition or change in health will improve Outcome: Progressing   Problem: Fluid Volume: Goal: Ability to maintain a balanced intake and output will improve Outcome: Progressing   Problem: Clinical Measurements: Goal: Will remain free from infection Outcome: Progressing   Problem: Elimination: Goal: Will not experience complications related to urinary retention Outcome: Progressing   Problem: Pain Management: Goal: General experience of comfort will improve Outcome: Progressing

## 2023-05-16 NOTE — Progress Notes (Signed)
Mobility Specialist Progress Note:    05/16/23 0849  Mobility  Activity Ambulated with assistance in hallway  Level of Assistance Modified independent, requires aide device or extra time  Assistive Device Other (Comment) (IV pole)  Distance Ambulated (ft) 150 ft  Activity Response Tolerated well  Mobility Referral Yes  $Mobility charge 1 Mobility  Mobility Specialist Start Time (ACUTE ONLY) 0840  Mobility Specialist Stop Time (ACUTE ONLY) 0849  Mobility Specialist Time Calculation (min) (ACUTE ONLY) 9 min   Pt received in bed, agreeable to mobility session. Tolerated well, asx throughout. ModI required with IV pole, SBA for safety. Returned pt to room, all needs met.   Feliciana Rossetti Mobility Specialist Please contact via Special educational needs teacher or  Rehab office at [phone]

## 2023-05-16 NOTE — Progress Notes (Signed)
Mobility Specialist Progress Note:   05/16/23 1531  Mobility  Activity Ambulated with assistance in hallway  Level of Assistance Modified independent, requires aide device or extra time  Assistive Device Other (Comment) (IV pole)  Distance Ambulated (ft) 300 ft  Activity Response Tolerated well  Mobility Referral Yes  $Mobility charge 1 Mobility  Mobility Specialist Start Time (ACUTE ONLY) 1520  Mobility Specialist Stop Time (ACUTE ONLY) 1530  Mobility Specialist Time Calculation (min) (ACUTE ONLY) 10 min   Pt received in bed, daughter at bedside. Agreeable to mobility session, ambulated in hallway using ModI with IV pole. Tolerated well, asx throughout. Returned pt to room, all needs met.    Feliciana Rossetti Mobility Specialist Please contact via Special educational needs teacher or  Rehab office at [phone]

## 2023-05-16 NOTE — Progress Notes (Signed)
1 Day Post-Op   Subjective/Chief Complaint: Feels better today with less pain, now with just post op soreness.  Ate some solid food last night.  Voiding well.   Objective: Vital signs in last 24 hours: Temp:  [97.6 F (36.4 C)-98.8 F (37.1 C)] 97.8 F (36.6 C) (11/05 0722) Pulse Rate:  [92-107] 104 (11/05 0722) Resp:  [12-18] 18 (11/05 0419) BP: (103-158)/(43-74) 103/43 (11/05 0722) SpO2:  [94 %-98 %] 97 % (11/05 0722) Weight:  [124.3 kg] 124.3 kg (11/05 0452) Last BM Date : 05/14/23  Intake/Output from previous day: 11/04 0701 - 11/05 0700 In: 1947.7 [P.O.:120; I.V.:1458.4; IV Piggyback:369.3] Out: 1775 [Urine:1650; Blood:25] Intake/Output this shift: No intake/output data recorded.  PE: Abd: soft, appropriately tender, incisions c/d/I, ND  Lab Results:  Recent Labs    05/15/23 0650 05/16/23 0532  WBC 9.3 11.6*  HGB 13.7 12.8*  HCT 41.7 40.0  PLT 333 381   BMET Recent Labs    05/15/23 0650 05/16/23 0532  NA 137 133*  K 4.3 4.6  CL 103 102  CO2 24 14*  GLUCOSE 110* 181*  BUN 13 22  CREATININE 1.03 1.30*  CALCIUM 8.9 8.4*   PT/INR Recent Labs    05/14/23 2250  LABPROT 14.1  INR 1.1   ABG No results for input(s): "PHART", "HCO3" in the last 72 hours.  Invalid input(s): "PCO2", "PO2"  Studies/Results: US Abdomen Limited RUQ (LIVER/GB)  Result Date: 05/14/2023 CLINICAL DATA:  Right upper quadrant pain EXAM: ULTRASOUND ABDOMEN LIMITED RIGHT UPPER QUADRANT COMPARISON:  05/09/2023 FINDINGS: Gallbladder: Moderately distended gallbladder containing sludge and multiple stones. Largest stone identified measures 8 mm in diameter. Mild gallbladder wall thickening at approximately 5 mm. Murphy sign unable to be adequately assessed as patient has been medicated. Common bile duct: Diameter: 2 mm. Liver: No focal lesion identified. Within normal limits in parenchymal echogenicity. Portal vein is patent on color Doppler imaging with normal direction of blood flow  towards the liver. Other: None. IMPRESSION: Cholelithiasis with mild gallbladder wall thickening. Findings are suspicious for acute cholecystitis. Electronically Signed   By: Duanne Guess D.O.   On: 05/14/2023 19:13    Anti-infectives: Anti-infectives (From admission, onward)    Start     Dose/Rate Route Frequency Ordered Stop   05/15/23 0300  piperacillin-tazobactam (ZOSYN) IVPB 3.375 g        3.375 g 12.5 mL/hr over 240 Minutes Intravenous Every 8 hours 05/14/23 2035     05/14/23 1945  piperacillin-tazobactam (ZOSYN) IVPB 3.375 g        3.375 g 100 mL/hr over 30 Minutes Intravenous  Once 05/14/23 1945 05/14/23 2055       Assessment/Plan: POD 1, s/p lap chole for empyema of the gallbladder, Dr. Janee Morn 11/4.  -looks well today -tolerating HH diet -mobilize, pulm toilet -cont abx therapy while here, but doesn't need at discharge -follow up being arranged -Rx sent to pharmacy -if patient doing well and wanting to go home later today, that is ok with surgery.  If he feels he needs an extra day, that is ok as well. -d/w primary service.  FEN - HH diet VTE - Lovenox ID - Zosyn  CHF DM HLD HTN OSA   LOS: 2 days    Letha Cape 05/16/2023

## 2023-05-17 DIAGNOSIS — K810 Acute cholecystitis: Secondary | ICD-10-CM | POA: Diagnosis not present

## 2023-05-17 LAB — BASIC METABOLIC PANEL
Anion gap: 11 (ref 5–15)
BUN: 20 mg/dL (ref 8–23)
CO2: 22 mmol/L (ref 22–32)
Calcium: 8.3 mg/dL — ABNORMAL LOW (ref 8.9–10.3)
Chloride: 100 mmol/L (ref 98–111)
Creatinine, Ser: 1.19 mg/dL (ref 0.61–1.24)
GFR, Estimated: >60 mL/min (ref >60)
Glucose, Bld: 145 mg/dL — ABNORMAL HIGH (ref 70–99)
Potassium: 3.8 mmol/L (ref 3.5–5.1)
Sodium: 133 mmol/L — ABNORMAL LOW (ref 135–145)

## 2023-05-17 LAB — CBC
HCT: 37.4 % — ABNORMAL LOW (ref 39.0–52.0)
Hemoglobin: 12.2 g/dL — ABNORMAL LOW (ref 13.0–17.0)
MCH: 29.4 pg (ref 26.0–34.0)
MCHC: 32.6 g/dL (ref 30.0–36.0)
MCV: 90.1 fL (ref 80.0–100.0)
Platelets: 399 10*3/uL (ref 150–400)
RBC: 4.15 MIL/uL — ABNORMAL LOW (ref 4.22–5.81)
RDW: 13.1 % (ref 11.5–15.5)
WBC: 9.9 10*3/uL (ref 4.0–10.5)
nRBC: 0 % (ref 0.0–0.2)

## 2023-05-17 LAB — HEPATIC FUNCTION PANEL
ALT: 29 U/L (ref 0–44)
AST: 27 U/L (ref 15–41)
Albumin: 2.5 g/dL — ABNORMAL LOW (ref 3.5–5.0)
Alkaline Phosphatase: 84 U/L (ref 38–126)
Bilirubin, Direct: 0.2 mg/dL (ref 0.0–0.2)
Indirect Bilirubin: 1 mg/dL — ABNORMAL HIGH (ref 0.3–0.9)
Total Bilirubin: 1.2 mg/dL — ABNORMAL HIGH (ref <1.2)
Total Protein: 6.3 g/dL — ABNORMAL LOW (ref 6.5–8.1)

## 2023-05-17 LAB — PHOSPHORUS: Phosphorus: 1.8 mg/dL — ABNORMAL LOW (ref 2.5–4.6)

## 2023-05-17 LAB — GLUCOSE, CAPILLARY: Glucose-Capillary: 146 mg/dL — ABNORMAL HIGH (ref 70–99)

## 2023-05-17 LAB — MAGNESIUM: Magnesium: 2 mg/dL (ref 1.7–2.4)

## 2023-05-17 MED ORDER — SODIUM PHOSPHATES 45 MMOLE/15ML IV SOLN
15.0000 mmol | Freq: Once | INTRAVENOUS | Status: AC
  Administered 2023-05-17: 15 mmol via INTRAVENOUS
  Filled 2023-05-17: qty 5

## 2023-05-17 NOTE — Progress Notes (Signed)
2 Days Post-Op   Subjective/Chief Complaint: Feels well with no complaints.  Tolerating a diet, ambulating, pain well controlled.   Objective: Vital signs in last 24 hours: Temp:  [97.5 F (36.4 C)-98.4 F (36.9 C)] 98.4 F (36.9 C) (11/06 0323) Pulse Rate:  [98-106] 101 (11/06 0719) Resp:  [18] 18 (11/06 0323) BP: (128-150)/(61-80) 128/61 (11/06 0719) SpO2:  [97 %-98 %] 98 % (11/06 0719) Weight:  [125.2 kg] 125.2 kg (11/06 0500) Last BM Date : 05/14/23  Intake/Output from previous day: 11/05 0701 - 11/06 0700 In: 741.5 [P.O.:240; I.V.:364.7; IV Piggyback:136.7] Out: 1350 [Urine:1350] Intake/Output this shift: No intake/output data recorded.  PE: Abd: soft, appropriately tender, incisions c/d/I, ND  Lab Results:  Recent Labs    05/16/23 0532 05/17/23 0442  WBC 11.6* 9.9  HGB 12.8* 12.2*  HCT 40.0 37.4*  PLT 381 399   BMET Recent Labs    05/16/23 0532 05/17/23 0442  NA 133* 133*  K 4.6 3.8  CL 102 100  CO2 14* 22  GLUCOSE 181* 145*  BUN 22 20  CREATININE 1.30* 1.19  CALCIUM 8.4* 8.3*   PT/INR Recent Labs    05/14/23 2250  LABPROT 14.1  INR 1.1   ABG No results for input(s): "PHART", "HCO3" in the last 72 hours.  Invalid input(s): "PCO2", "PO2"  Studies/Results: No results found.  Anti-infectives: Anti-infectives (From admission, onward)    Start     Dose/Rate Route Frequency Ordered Stop   05/15/23 0300  piperacillin-tazobactam (ZOSYN) IVPB 3.375 g        3.375 g 12.5 mL/hr over 240 Minutes Intravenous Every 8 hours 05/14/23 2035     05/14/23 1945  piperacillin-tazobactam (ZOSYN) IVPB 3.375 g        3.375 g 100 mL/hr over 30 Minutes Intravenous  Once 05/14/23 1945 05/14/23 2055       Assessment/Plan: POD 2, s/p lap chole for empyema of the gallbladder, Dr. Janee Morn 11/4.  -looks well today -tolerating HH diet -mobilize, pulm toilet -cont abx therapy while here, but doesn't need at discharge -follow up arranged -Rx sent to  pharmacy -being discharged today -d/w primary service.  FEN - HH diet VTE - Lovenox ID - Zosyn  CHF DM HLD HTN OSA   LOS: 3 days    Letha Cape 05/17/2023

## 2023-05-17 NOTE — Progress Notes (Signed)
Mobility Specialist Progress Note:    05/17/23 0836  Mobility  Activity Ambulated with assistance in hallway  Level of Assistance Modified independent, requires aide device or extra time  Assistive Device Other (Comment) (IV pole)  Distance Ambulated (ft) 300 ft  Activity Response Tolerated well  Mobility Referral Yes  $Mobility charge 1 Mobility  Mobility Specialist Start Time (ACUTE ONLY) [id number]  Mobility Specialist Stop Time (ACUTE ONLY) [id number]  Mobility Specialist Time Calculation (min) (ACUTE ONLY) 6 min   Pt received in bed, agreeable to mobility session. Tolerated well, asx throughout. Returned pt to room, all needs met, call bell in reach.  Feliciana Rossetti Mobility Specialist Please contact via Special educational needs teacher or  Rehab office at [phone]

## 2023-05-17 NOTE — Telephone Encounter (Signed)
Noted. Thanks.

## 2023-05-17 NOTE — Plan of Care (Signed)
  Problem: Coping: Goal: Ability to adjust to condition or change in health will improve Outcome: Progressing   Problem: Fluid Volume: Goal: Ability to maintain a balanced intake and output will improve Outcome: Progressing   Problem: Metabolic: Goal: Ability to maintain appropriate glucose levels will improve Outcome: Progressing   Problem: Skin Integrity: Goal: Risk for impaired skin integrity will decrease Outcome: Progressing   Problem: Clinical Measurements: Goal: Will remain free from infection Outcome: Progressing

## 2023-05-17 NOTE — Discharge Summary (Signed)
Physician Discharge Summary  [name] YNW:[id number] DOB: Dec 03, 1960 DOA: 05/14/2023  PCP: Joaquim Nam, MD  Admit date: 05/14/2023 Discharge date: 05/17/2023  Admitted From: home Disposition:  home  Recommendations for Outpatient Follow-up:  Follow up with PCP in 1-2 weeks Follow up with general surgery in 2 weeks  Home Health: none Equipment/Devices: none  Discharge Condition: stable CODE STATUS: Full code Diet Orders (From admission, onward)     Start     Ordered   05/15/23 1142  Diet Heart Room service appropriate? Yes; Fluid consistency: Thin  Diet effective now       Question Answer Comment  Room service appropriate? Yes   Fluid consistency: Thin      05/15/23 1141            HPI: Per admitting MD, [name] is a 62 y.o. male with medical history significant for hypertension, hyperlipidemia, type 2 diabetes mellitus, nonobstructive CAD, chronic diastolic CHF, and obesity with history of gastric banding in 2011 who presents with abdominal pain, nausea, and dry heaves. Patient has had similar episodes recently that were self-limited. He was seen in the emergency department on 05/09/2023 with similar complaints, was found to have cholelithiasis without evidence for acute cholecystitis on [name], and he was discharged home.   He had been referred to a surgeon for evaluation of this but had a particularly severe episode this afternoon with severe pain, nausea, and dry heaves that prompted his presentation to the ED.  Pain is severe and radiating from upper abdomen through to his back. He has had subjective fevers at home. He denies any chest pain or SOB.    Hospital Course / Discharge diagnoses: Principal Problem:   Acute cholecystitis Active Problems:   Chronic diastolic CHF (congestive heart failure) (HCC)   CAD (coronary artery disease)   Essential hypertension   Type 2 diabetes mellitus without complication, with long-term current use of insulin  (HCC)   Principal problem Acute calculus cholecystitis-General Surgery was consulted and evaluated patient while hospitalized, he is status post laparoscopic cholecystectomy done 05/15/2023.  He recovered well postoperatively, able to tolerate a regular diet, and will be discharged home in stable condition  Active problems Metabolic acidosis-postoperatively, resolved CAD-nonobstructive, no chest pain Chronic diastolic CHF-appears compensated Essential hypertension-resume home medications DM2, poorly controlled, with hyperglycemia-resume home medications.  Lab Results  Component Value Date   HGBA1C 8.6 (H) 05/14/2023   CBG (last 3)  Recent Labs    05/16/23 1749 05/16/23 2011 05/17/23 0719  GLUCAP 189* 161* 146*    Sepsis ruled out   Discharge Instructions   Allergies as of 05/17/2023       Reactions   Diclofenac Sodium Shortness Of Breath, Itching, Other (See Comments)   Hypotension   Statins Shortness Of Breath   Voltaren [diclofenac Sodium] Anaphylaxis   Lipitor [atorvastatin]    Myalgias at 80mg  dose   Metoprolol    Lightheaded/dizzy/SOB.     Ramipril    Lightheaded/dizzy/SOB.          Medication List     TAKE these medications    acetaminophen 500 MG tablet Commonly known as: TYLENOL Take 1,000 mg by mouth once as needed for moderate pain (pain score 4-6).   albuterol 108 (90 Base) MCG/ACT inhaler Commonly known as: VENTOLIN HFA Inhale 1-2 puffs into the lungs every 6 (six) hours as needed for wheezing or shortness of breath.   aspirin EC 81 MG tablet Take 81 mg by mouth daily.  cyclobenzaprine 5 MG tablet Commonly known as: FLEXERIL TAKE ONE TABLET BY MOUTH EVERY NIGHT AT BEDTIME   ezetimibe 10 MG tablet Commonly known as: ZETIA Take 1 tablet (10 mg total) by mouth daily.   FLUoxetine 20 MG tablet Commonly known as: PROZAC TAKE ONE TABLET BY MOUTH ONCE A DAY   gabapentin 100 MG capsule Commonly known as: NEURONTIN TAKE ONE TO THREE  CAPSULES BY MOUTH THREE TIMES DAILY AS NEEDED What changed: See the new instructions.   insulin degludec 200 UNIT/ML FlexTouch Pen Commonly known as: TRESIBA Inject 32 Units into the skin every evening.   Jardiance 25 MG Tabs tablet Generic drug: empagliflozin TAKE 1 TABLET BY MOUTH ONCE A DAY   metFORMIN 1000 MG tablet Commonly known as: GLUCOPHAGE TAKE 1 TABLET (1,000 MG TOTAL) BY MOUTH 2 (TWO) TIMES DAILY WITH A MEAL.   multivitamin capsule Take 1 capsule by mouth daily.   nebivolol 5 MG tablet Commonly known as: BYSTOLIC TAKE ONE TABLET (5 MG TOTAL) BY MOUTH DAILY. PLEASE KEEP SCHEDULED APPOINTMENT FOR FUTURE REFILLS. THANK YOU.   nitroGLYCERIN 0.4 MG SL tablet Commonly known as: NITROSTAT Place 1 tablet (0.4 mg total) under the tongue every 5 (five) minutes as needed for chest pain.   NovoLOG FlexPen 100 UNIT/ML FlexPen Generic drug: insulin aspart Per sliding scale What changed:  how much to take when to take this   omeprazole 20 MG capsule Commonly known as: PRILOSEC Take 20 mg by mouth daily.   ondansetron 4 MG tablet Commonly known as: ZOFRAN Take 1 tablet (4 mg total) by mouth every 6 (six) hours. What changed:  when to take this reasons to take this   oxyCODONE 5 MG immediate release tablet Commonly known as: Oxy IR/ROXICODONE Take 1 tablet (5 mg total) by mouth every 4 (four) hours as needed.   prednisoLONE acetate 1 % ophthalmic suspension Commonly known as: PRED FORTE Place 1 drop into the right eye daily.   Repatha SureClick 140 MG/ML Soaj Generic drug: Evolocumab Inject 140 mg into the skin every 14 (fourteen) days.   Trulicity 4.5 MG/0.5ML Soaj Generic drug: Dulaglutide Inject 4.5 mg into the skin once a week. Saturday or Sunday- Last injection was 05/06/2023.   Vitamin D (Ergocalciferol) 1.25 MG (50000 UNIT) Caps capsule Commonly known as: DRISDOL Take 1 capsule (50,000 Units total) by mouth every 7 (seven) days.        Follow-up  Information     Violeta Gelinas, MD. Call on 06/14/2023.   Specialty: General Surgery Why: 9:10 am, Arrive 30 minutes prior to your appointment time, Please bring your insurance card and photo ID Contact information: [address] [place] [address] [place] [address] [phone]                Consultations: General surgery   Procedures/Studies:  US Abdomen Limited RUQ (LIVER/GB)  Result Date: 05/14/2023 CLINICAL DATA:  Right upper quadrant pain EXAM: ULTRASOUND ABDOMEN LIMITED RIGHT UPPER QUADRANT COMPARISON:  05/09/2023 FINDINGS: Gallbladder: Moderately distended gallbladder containing sludge and multiple stones. Largest stone identified measures 8 mm in diameter. Mild gallbladder wall thickening at approximately 5 mm. Murphy sign unable to be adequately assessed as patient has been medicated. Common bile duct: Diameter: 2 mm. Liver: No focal lesion identified. Within normal limits in parenchymal echogenicity. Portal vein is patent on color Doppler imaging with normal direction of blood flow towards the liver. Other: None. IMPRESSION: Cholelithiasis with mild gallbladder wall thickening. Findings are suspicious for acute cholecystitis. Electronically Signed  By: Duanne Guess D.O.   On: 05/14/2023 19:13   US ABDOMEN LIMITED RUQ (LIVER/GB)  Result Date: 05/09/2023 CLINICAL DATA:  Upper abdominal pain EXAM: ULTRASOUND ABDOMEN LIMITED RIGHT UPPER QUADRANT COMPARISON:  CT 05/09/2023 earlier FINDINGS: Gallbladder: Gallbladder is dilated. No wall thickening or adjacent fluid. There are some stones identified. Sludge as well. Common bile duct: Diameter: 2 mm Liver: No focal lesion identified. Within normal limits in parenchymal echogenicity. Portal vein is patent on color Doppler imaging with normal direction of blood flow towards the liver. Other: None. IMPRESSION: Dilated gallbladder with stones and sludge. No further sonographic evidence of acute cholecystitis. Please correlate for  clinical findings if needed further workup such as HIDA scan. No biliary ductal dilatation. Electronically Signed   By: Karen Kays M.D.   On: 05/09/2023 13:07   CT ABDOMEN PELVIS WO CONTRAST  Result Date: 05/09/2023 CLINICAL DATA:  62 year old male with postprandial abdominal pain, query gastric banding complication. EXAM: CT ABDOMEN AND PELVIS WITHOUT CONTRAST TECHNIQUE: Multidetector CT imaging of the abdomen and pelvis was performed following the standard protocol without IV contrast. RADIATION DOSE REDUCTION: This exam was performed according to the departmental dose-optimization program which includes automated exposure control, adjustment of the mA and/or kV according to patient size and/or use of iterative reconstruction technique. COMPARISON:  CT Abdomen and Pelvis 10/26/2017. FINDINGS: Lower chest: Normal heart size. No pericardial or pleural effusion. Lung bases are clear. There is retained oral contrast in the visible distal esophagus, although distal esophagus size does not appear significantly changed from 2019. Hepatobiliary: The gallbladder is larger now and the gallbladder wall appears indistinct such as on series 5, image 35. No discrete gallstone by CT. Noncontrast liver appears stable, negative. No bile duct enlargement is evident. Pancreas: Chronic fatty atrophy of the pancreas is stable. Spleen: Stable, negative.  Small splenule (normal variant). Adrenals/Urinary Tract: Normal adrenal glands. Nonobstructed kidneys. Decompressed ureters. No nephrolithiasis. Unremarkable bladder. Stomach/Bowel: Decompressed and negative large bowel from the splenic flexure distally. Mild transverse colon and right colon retained stool. Sequelae of appendectomy series 2, image 59. No large bowel inflammation. Negative terminal ileum. No dilated small bowel. No free air or free fluid. Chronic gastric banding with right abdominal port near midline. Catheter course and configuration unchanged since 2019. Lap  band configuration at the proximal stomach (series 2, image 20) also appears unchanged. There might be slightly more fluid in the band now compared to prior (compare series 2, image 18 today to series 2, image 17 previously). No inflammation adjacent to or along the course of the device. Small volume upstream gas and oral contrast in the distal esophagus, caliber not significantly changed from 2019. Distal stomach oral contrast is present, and oral contrast is also present in nondilated duodenum and multiple proximal small bowel loops. No gastro duodenal inflammation. Vascular/Lymphatic: Mild calcified atherosclerosis. Normal caliber abdominal aorta. Vascular patency is not evaluated in the absence of IV contrast. No lymphadenopathy. Reproductive: Negative. Other: No pelvis free fluid. Musculoskeletal: Progressed lumbar spine degeneration since 2019, superimposed on previous L4 through S1 fusion. New vacuum disc at L2-L3. No acute osseous abnormality identified. IMPRESSION: 1. Subtle abnormality of the Gallbladder by noncontrast CT. Consider Cholecystitis and recommend Right Upper Quadrant Ultrasound. 2. Chronic gastric band with no change in configuration compared to a 2019 CT, and no associated inflammation or adverse features. There may be slightly more fluid within the band now when compared to 2019. But administered oral contrast has reached the proximal small bowel without evidence  of obstruction. 3. No other acute or inflammatory process identified in the noncontrast abdomen or pelvis. Electronically Signed   By: Odessa Fleming M.D.   On: 05/09/2023 09:58   DG Chest 2 View  Result Date: 05/09/2023 CLINICAL DATA:  Chest pain and worsening shortness of breath EXAM: CHEST - 2 VIEW COMPARISON:  Chest radiograph dated 02/21/2022 FINDINGS: Normal lung volumes. No focal consolidations. No pleural effusion or pneumothorax. The heart size and mediastinal contours are within normal limits. No acute osseous abnormality.  IMPRESSION: No active cardiopulmonary disease. Electronically Signed   By: Agustin Cree M.D.   On: 05/09/2023 09:27     Subjective: - no chest pain, shortness of breath, no abdominal pain, nausea or vomiting.   Discharge Exam: BP 128/61 (BP Location: Right Arm)   Pulse (!) 101   Temp 98.4 F (36.9 C)   Resp 18   Ht 6\' 1"  (1.854 m)   Wt 125.2 kg   SpO2 98%   BMI 36.42 kg/m   General: Pt is alert, awake, not in acute distress Cardiovascular: RRR, S1/S2 +, no rubs, no gallops Respiratory: CTA bilaterally, no wheezing, no rhonchi Abdominal: Soft, NT, ND, bowel sounds + Extremities: no edema, no cyanosis    The results of significant diagnostics from this hospitalization (including imaging, microbiology, ancillary and laboratory) are listed below for reference.     Microbiology: Recent Results (from the past 240 hour(s))  Culture, blood (x 2)     Status: None (Preliminary result)   Collection Time: 05/14/23 10:51 PM   Specimen: BLOOD RIGHT ARM  Result Value Ref Range Status   Specimen Description BLOOD RIGHT ARM  Final   Special Requests   Final    BOTTLES DRAWN AEROBIC AND ANAEROBIC Blood Culture results may not be optimal due to an excessive volume of blood received in culture bottles   Culture   Final    NO GROWTH 2 DAYS Performed at [facility], [address], [place], [place] [address]    Report Status PENDING  Incomplete  Culture, blood (x 2)     Status: None (Preliminary result)   Collection Time: 05/14/23 10:51 PM   Specimen: BLOOD LEFT HAND  Result Value Ref Range Status   Specimen Description BLOOD LEFT HAND  Final   Special Requests   Final    BOTTLES DRAWN AEROBIC AND ANAEROBIC Blood Culture adequate volume   Culture   Final    NO GROWTH 2 DAYS Performed at [facility], [address] [place], [place] [address]    Report Status PENDING  Incomplete     Labs: Basic Metabolic Panel: Recent Labs  Lab 05/14/23 1801 05/15/23 0650  05/16/23 0532 05/17/23 0442  NA 136 137 133* 133*  K 4.2 4.3 4.6 3.8  CL 96* 103 102 100  CO2 25 24 14* 22  GLUCOSE 169* 110* 181* 145*  BUN 15 13 22 20   CREATININE 0.94 1.03 1.30* 1.19  CALCIUM 10.2 8.9 8.4* 8.3*  MG  --   --  1.8 2.0  PHOS  --   --  4.0 1.8*   Liver Function Tests: Recent Labs  Lab 05/14/23 1801 05/15/23 0650 05/16/23 0532 05/17/23 0442  AST 12* 85* 45* 27  ALT 8 32 34 29  ALKPHOS 86 103 92 84  BILITOT 0.8 1.5* 1.7* 1.2*  PROT 7.8 6.4* 6.6 6.3*  ALBUMIN 4.2 2.8* 2.8* 2.5*   CBC: Recent Labs  Lab 05/14/23 1801 05/15/23 0650 05/16/23  0532 05/17/23 0442  WBC 13.6* 9.3 11.6* 9.9  NEUTROABS 9.2*  --   --   --   HGB 14.7 13.7 12.8* 12.2*  HCT 43.1 41.7 40.0 37.4*  MCV 87.2 88.9 91.3 90.1  PLT 371 333 381 399   CBG: Recent Labs  Lab 05/16/23 1208 05/16/23 1537 05/16/23 1749 05/16/23 2011 05/17/23 0719  GLUCAP 170* 197* 189* 161* 146*   Hgb A1c Recent Labs    05/14/23 2025  HGBA1C 8.6*   Lipid Profile No results for input(s): "CHOL", "HDL", "LDLCALC", "TRIG", "CHOLHDL", "LDLDIRECT" in the last 72 hours. Thyroid function studies No results for input(s): "TSH", "T4TOTAL", "T3FREE", "THYROIDAB" in the last 72 hours.  Invalid input(s): "FREET3" Urinalysis    Component Value Date/Time   COLORURINE YELLOW 05/14/2023 1801   APPEARANCEUR CLEAR 05/14/2023 1801   LABSPEC 1.029 05/14/2023 1801   PHURINE 6.0 05/14/2023 1801   GLUCOSEU >1,000 (A) 05/14/2023 1801   HGBUR TRACE (A) 05/14/2023 1801   BILIRUBINUR NEGATIVE 05/14/2023 1801   BILIRUBINUR neg 05/28/2014 1456   KETONESUR 40 (A) 05/14/2023 1801   PROTEINUR NEGATIVE 05/14/2023 1801   UROBILINOGEN 0.2 05/28/2014 1456   UROBILINOGEN 1.0 01/10/2009 0001   NITRITE NEGATIVE 05/14/2023 1801   LEUKOCYTESUR NEGATIVE 05/14/2023 1801    FURTHER DISCHARGE INSTRUCTIONS:   Get Medicines reviewed and adjusted: Please take all your medications with you for your next visit with your Primary  MD   Laboratory/radiological data: Please request your Primary MD to go over all hospital tests and procedure/radiological results at the follow up, please ask your Primary MD to get all Hospital records sent to his/her office.   In some cases, they will be blood work, cultures and biopsy results pending at the time of your discharge. Please request that your primary care M.D. goes through all the records of your hospital data and follows up on these results.   Also Note the following: If you experience worsening of your admission symptoms, develop shortness of breath, life threatening emergency, suicidal or homicidal thoughts you must seek medical attention immediately by calling 911 or calling your MD immediately  if symptoms less severe.   You must read complete instructions/literature along with all the possible adverse reactions/side effects for all the Medicines you take and that have been prescribed to you. Take any new Medicines after you have completely understood and accpet all the possible adverse reactions/side effects.    Do not drive when taking Pain medications or sleeping medications (Benzodaizepines)   Do not take more than prescribed Pain, Sleep and Anxiety Medications. It is not advisable to combine anxiety,sleep and pain medications without talking with your primary care practitioner   Special Instructions: If you have smoked or chewed Tobacco  in the last 2 yrs please stop smoking, stop any regular Alcohol  and or any Recreational drug use.   Wear Seat belts while driving.   Please note: You were cared for by a hospitalist during your hospital stay. Once you are discharged, your primary care physician will handle any further medical issues. Please note that NO REFILLS for any discharge medications will be authorized once you are discharged, as it is imperative that you return to your primary care physician (or establish a relationship with a primary care physician if you do  not have one) for your post hospital discharge needs so that they can reassess your need for medications and monitor your lab values.  Time coordinating discharge: 35 minutes  SIGNED:  Pamella Pert, MD,  PhD 05/17/2023, 7:53 AM

## 2023-05-19 ENCOUNTER — Ambulatory Visit: Payer: BC Managed Care – PPO | Admitting: Family Medicine

## 2023-05-19 ENCOUNTER — Other Ambulatory Visit (INDEPENDENT_AMBULATORY_CARE_PROVIDER_SITE_OTHER): Payer: Self-pay | Admitting: Internal Medicine

## 2023-05-19 ENCOUNTER — Encounter: Payer: Self-pay | Admitting: Family Medicine

## 2023-05-19 VITALS — BP 122/60 | HR 101 | Temp 98.4°F | Ht 73.0 in | Wt 270.2 lb

## 2023-05-19 DIAGNOSIS — Z8719 Personal history of other diseases of the digestive system: Secondary | ICD-10-CM

## 2023-05-19 DIAGNOSIS — E559 Vitamin D deficiency, unspecified: Secondary | ICD-10-CM

## 2023-05-19 LAB — CULTURE, BLOOD (ROUTINE X 2)
Culture: NO GROWTH
Culture: NO GROWTH
Special Requests: ADEQUATE

## 2023-05-19 MED ORDER — ONDANSETRON HCL 4 MG PO TABS: 4.0000 mg | ORAL_TABLET | Freq: Four times a day (QID) | ORAL | 1 refills | Status: AC | PRN

## 2023-05-19 NOTE — Progress Notes (Unsigned)
Inpatient f/u.  Admitted with cholecystitis, had cholecystectomy and was able to be discharged.  Inpatient course discussed with patient.  He has noted a catch in the R side with a deep breath.   Prn oxycodone use but not constant pain/use.  Pain is better, prev with shoulder pain as expected.  Abd wall sites are healing.    No fevers.  No chills.  No vomiting.  No blood in stool.  Eating better now, not back to baseline.  D/w pt about avoiding fatty foods and routine cautions.    He has zofran to use if needed.    Meds, vitals, and allergies reviewed.   ROS: Per HPI unless specifically indicated in ROS section   Nad Ncat Neck supple, no LA Rrr Ctab Abd soft, RUQ not ttp.   No jaundice and surgery sites are healing normally.

## 2023-05-19 NOTE — Patient Instructions (Signed)
Update [name] as needed.  Gradually increase your meals but I would be careful with fatty foods.  Take care.  Glad to see you.

## 2023-05-20 LAB — COLOGUARD: COLOGUARD: NEGATIVE

## 2023-05-21 DIAGNOSIS — Z8719 Personal history of other diseases of the digestive system: Secondary | ICD-10-CM | POA: Insufficient documentation

## 2023-05-21 NOTE — Assessment & Plan Note (Signed)
Gradually increase diet.  Cautions regarding fatty foods discussed with patient.  If he has persistent diarrhea he can update [name].  If fever abdominal pain or jaundice then seek care.  He understood.

## 2023-06-05 ENCOUNTER — Telehealth: Payer: Self-pay | Admitting: Family Medicine

## 2023-06-05 NOTE — Telephone Encounter (Signed)
FYI: This call has been transferred to Access Nurse. Once the result note has been entered staff can address the message at that time.  Patient called in with the following symptoms:  Red Word: wheezing    Please advise at Mobile [phone] (mobile)  Message is routed to Provider Pool and [facility] Triage    Pt was transferred to access nurse by Delaney Meigs for wheezing. Pt's wife, [name], called back stating the pt was advised to see if our office had any sooner appts available. No other symptoms. No slots available until tomorrow, 11/26. Pt is scheduled to see Cable tomorrow, 11/26 @ 8:40am.

## 2023-06-05 NOTE — Telephone Encounter (Signed)
Noted. Thanks.

## 2023-06-05 NOTE — Telephone Encounter (Signed)
Recommendation was to go to ED. Patient declined was scheduled in office as message below.   Pt was transferred to access nurse by Delaney Meigs for wheezing. Pt's wife, Marchelle Folks, called back stating the pt was advised to see if our office had any sooner appts available. No other symptoms. No slots available until tomorrow, 11/26. Pt is scheduled to see Cable tomorrow, 11/26 @ 8:40am.

## 2023-06-06 ENCOUNTER — Encounter: Payer: Self-pay | Admitting: Nurse Practitioner

## 2023-06-06 ENCOUNTER — Ambulatory Visit: Payer: BC Managed Care – PPO | Admitting: Nurse Practitioner

## 2023-06-06 ENCOUNTER — Ambulatory Visit (INDEPENDENT_AMBULATORY_CARE_PROVIDER_SITE_OTHER)
Admission: RE | Admit: 2023-06-06 | Discharge: 2023-06-06 | Disposition: A | Discharge status: Home / Self Care | Payer: BC Managed Care – PPO | Source: Ambulatory Visit | Attending: Nurse Practitioner

## 2023-06-06 VITALS — BP 118/64 | HR 85 | Temp 98.3°F | Ht 73.0 in | Wt 279.0 lb

## 2023-06-06 DIAGNOSIS — J189 Pneumonia, unspecified organism: Secondary | ICD-10-CM

## 2023-06-06 DIAGNOSIS — R0609 Other forms of dyspnea: Secondary | ICD-10-CM | POA: Diagnosis not present

## 2023-06-06 DIAGNOSIS — R5383 Other fatigue: Secondary | ICD-10-CM

## 2023-06-06 DIAGNOSIS — R051 Acute cough: Secondary | ICD-10-CM

## 2023-06-06 DIAGNOSIS — R509 Fever, unspecified: Secondary | ICD-10-CM

## 2023-06-06 DIAGNOSIS — R110 Nausea: Secondary | ICD-10-CM

## 2023-06-06 LAB — COMPREHENSIVE METABOLIC PANEL
ALT: 8 U/L (ref 0–53)
AST: 11 U/L (ref 0–37)
Albumin: 3.5 g/dL (ref 3.5–5.2)
Alkaline Phosphatase: 113 U/L (ref 39–117)
BUN: 16 mg/dL (ref 6–23)
CO2: 31 meq/L (ref 19–32)
Calcium: 9.1 mg/dL (ref 8.4–10.5)
Chloride: 98 meq/L (ref 96–112)
Creatinine, Ser: 0.84 mg/dL (ref 0.40–1.50)
GFR: 92.4 mL/min (ref >60.00)
Glucose, Bld: 79 mg/dL (ref 70–99)
Potassium: 5.2 meq/L — ABNORMAL HIGH (ref 3.5–5.1)
Sodium: 137 meq/L (ref 135–145)
Total Bilirubin: 0.6 mg/dL (ref 0.2–1.2)
Total Protein: 6.8 g/dL (ref 6.0–8.3)

## 2023-06-06 LAB — CBC
HCT: 39.1 % (ref 39.0–52.0)
Hemoglobin: 12.7 g/dL — ABNORMAL LOW (ref 13.0–17.0)
MCHC: 32.4 g/dL (ref 30.0–36.0)
MCV: 90.4 fL (ref 78.0–100.0)
Platelets: 618 10*3/uL — ABNORMAL HIGH (ref 150.0–400.0)
RBC: 4.32 Mil/uL (ref 4.22–5.81)
RDW: 13.9 % (ref 11.5–15.5)
WBC: 11 10*3/uL — ABNORMAL HIGH (ref 4.0–10.5)

## 2023-06-06 LAB — BRAIN NATRIURETIC PEPTIDE: Pro B Natriuretic peptide (BNP): 67 pg/mL (ref 0.0–100.0)

## 2023-06-06 LAB — POC COVID19 BINAXNOW: SARS Coronavirus 2 Ag: NEGATIVE

## 2023-06-06 MED ORDER — PREDNISONE 20 MG PO TABS: ORAL_TABLET | ORAL | 0 refills | Status: AC

## 2023-06-06 MED ORDER — AZITHROMYCIN 250 MG PO TABS: ORAL_TABLET | ORAL | 0 refills | Status: AC

## 2023-06-06 MED ORDER — AMOXICILLIN-POT CLAVULANATE 875-125 MG PO TABS: 1.0000 | ORAL_TABLET | Freq: Two times a day (BID) | ORAL | 0 refills | Status: AC

## 2023-06-06 NOTE — Assessment & Plan Note (Signed)
Abdominal exam benign

## 2023-06-06 NOTE — Assessment & Plan Note (Signed)
COVID test in office.  Pending BNP and chest x-ray.  Patient in setting of heart failure and recent hospital admission for acute cholecystitis with cholecystectomy

## 2023-06-06 NOTE — Assessment & Plan Note (Signed)
COVID test in office pending CBC

## 2023-06-06 NOTE — Assessment & Plan Note (Signed)
COVID test in office pending basic labs

## 2023-06-06 NOTE — Assessment & Plan Note (Signed)
Ambiguous in nature.  Patient has been seen by pulmonology and cardiology in the past.  Wheezing in office albuterol inhaler somewhat beneficial.  Pending chest x-ray and basic labs

## 2023-06-06 NOTE — Patient Instructions (Signed)
Nice to see you today I will be in touch with the labs and xray once I have them Follow up if you do not improve

## 2023-06-06 NOTE — Progress Notes (Signed)
Acute Office Visit  Subjective:     Patient ID: [name], male    DOB: Dec 19, 1960, 62 y.o.   MRN: [id number]  Chief Complaint  Patient presents with   Wheezing    Pt complains of breathing difficulties that started Sunday night. Pt just had gallbladder surgery. Pt has no energy, does not have appetite. Complains of wheezing increasing since the surgery. Pt states that when he does anything he feels out of breath.      Patient is in today for wheezing/shortness of breath with a history of DM, HLD, HTN, CHF, anxiety, OSA.  Of note patient was in the hosptial on 05/15/2023 for a cholecycstitomy   States that he has been short of breath with some wheezing for approx 1 week. Statse that he is easily fatgiable. States that he is also having some decreased appeitis. States that he will eat but he is eating less than half of what he tradionally does. States that he is having some sputum that is thick. Spouse states more of a dry cough   States that the shortness of breath is with exertion. States that he is not having any shob with rest. He is having some nausea and losse stoosl to formed. States BM is every other. States no trouble peeing   Review of Systems  Constitutional:  Positive for chills, fever and malaise/fatigue.  HENT:  Positive for congestion. Negative for ear pain and sore throat.   Respiratory:  Positive for cough, sputum production, shortness of breath and wheezing.   Cardiovascular:  Negative for chest pain.  Gastrointestinal:  Positive for abdominal pain (since the surgery) and nausea. Negative for constipation and diarrhea.  Musculoskeletal:  Negative for joint pain and neck pain.  Neurological:  Positive for headaches.        Objective:    BP 118/64   Pulse 85   Temp 98.3 F (36.8 C) (Oral)   Ht 6\' 1"  (1.854 m)   Wt 279 lb (126.6 kg)   SpO2 98%   BMI 36.81 kg/m  BP Readings from Last 3 Encounters:  06/06/23 118/64  05/19/23 122/60  05/17/23 128/61    Wt Readings from Last 3 Encounters:  06/06/23 279 lb (126.6 kg)  05/19/23 270 lb 3.2 oz (122.6 kg)  05/17/23 276 lb 0.3 oz (125.2 kg)   SpO2 Readings from Last 3 Encounters:  06/06/23 98%  05/19/23 92%  05/17/23 98%      Physical Exam Vitals and nursing note reviewed.  Constitutional:      Appearance: Normal appearance.  HENT:     Right Ear: Tympanic membrane, ear canal and external ear normal.     Left Ear: Ear canal and external ear normal.     Nose:     Right Sinus: No maxillary sinus tenderness or frontal sinus tenderness.     Left Sinus: No maxillary sinus tenderness or frontal sinus tenderness.     Mouth/Throat:     Mouth: Mucous membranes are moist.     Pharynx: Oropharynx is clear.  Cardiovascular:     Rate and Rhythm: Normal rate and regular rhythm.     Heart sounds: Normal heart sounds.  Pulmonary:     Effort: Pulmonary effort is normal.     Breath sounds: Wheezing (LUL) present.  Abdominal:     General: Bowel sounds are normal. There is no distension.     Palpations: There is no mass.     Tenderness: There is no abdominal tenderness.  Hernia: No hernia is present.  Musculoskeletal:     Right lower leg: No edema.     Left lower leg: No edema.  Lymphadenopathy:     Cervical: No cervical adenopathy.  Neurological:     Mental Status: He is alert.     Results for orders placed or performed in visit on 06/06/23  POC COVID-19 BinaxNow  Result Value Ref Range   SARS Coronavirus 2 Ag Negative Negative        Assessment & Plan:   Problem List Items Addressed This Visit       Other   DOE (dyspnea on exertion)    Ambiguous in nature.  Patient has been seen by pulmonology and cardiology in the past.  Wheezing in office albuterol inhaler somewhat beneficial.  Pending chest x-ray and basic labs      Relevant Orders   CBC   Comprehensive metabolic panel   Brain natriuretic peptide   DG Chest 2 View (Completed)   Cough - Primary    COVID test in  office.  Pending BNP and chest x-ray.  Patient in setting of heart failure and recent hospital admission for acute cholecystitis with cholecystectomy      Relevant Orders   DG Chest 2 View (Completed)   POC COVID-19 BinaxNow (Completed)   Nausea    Abdominal exam benign      Fatigue    COVID test in office pending basic labs      Relevant Orders   CBC   Comprehensive metabolic panel   Fever and chills    COVID test in office pending CBC      Relevant Orders   CBC   Comprehensive metabolic panel    No orders of the defined types were placed in this encounter.   Return if symptoms worsen or fail to improve.  Audria Nine, NP

## 2023-06-07 ENCOUNTER — Other Ambulatory Visit: Payer: Self-pay | Admitting: Family Medicine

## 2023-06-07 NOTE — Telephone Encounter (Signed)
Refill request for GABAPENTIN 100MG  CAPSULE   LOV - 05/19/23 Next OV - not scheduled Last refill - 11/29/22 #270/0

## 2023-06-11 ENCOUNTER — Other Ambulatory Visit: Payer: Self-pay | Admitting: Nurse Practitioner

## 2023-06-11 ENCOUNTER — Other Ambulatory Visit: Payer: Self-pay | Admitting: Family Medicine

## 2023-06-11 DIAGNOSIS — E875 Hyperkalemia: Secondary | ICD-10-CM

## 2023-06-11 DIAGNOSIS — J189 Pneumonia, unspecified organism: Secondary | ICD-10-CM

## 2023-06-12 ENCOUNTER — Encounter: Payer: Self-pay | Admitting: Nurse Practitioner

## 2023-06-13 MED ORDER — ALBUTEROL SULFATE (2.5 MG/3ML) 0.083% IN NEBU
2.5000 mg | INHALATION_SOLUTION | Freq: Four times a day (QID) | RESPIRATORY_TRACT | 1 refills | Status: AC | PRN
Start: 2023-06-13 — End: ?

## 2023-06-14 ENCOUNTER — Telehealth: Payer: Self-pay | Admitting: Family Medicine

## 2023-06-14 ENCOUNTER — Other Ambulatory Visit: Payer: BC Managed Care – PPO

## 2023-06-14 NOTE — Telephone Encounter (Signed)
Please see MyChart message and triage patient about his breathing/fever.  Thanks.

## 2023-06-15 ENCOUNTER — Other Ambulatory Visit: Payer: BC Managed Care – PPO

## 2023-06-15 ENCOUNTER — Ambulatory Visit: Payer: BC Managed Care – PPO | Admitting: Family Medicine

## 2023-06-15 VITALS — BP 100/60 | HR 87 | Temp 98.1°F | Ht 73.0 in | Wt 282.1 lb

## 2023-06-15 DIAGNOSIS — J189 Pneumonia, unspecified organism: Secondary | ICD-10-CM | POA: Diagnosis not present

## 2023-06-15 DIAGNOSIS — R0602 Shortness of breath: Secondary | ICD-10-CM | POA: Diagnosis not present

## 2023-06-15 DIAGNOSIS — E875 Hyperkalemia: Secondary | ICD-10-CM | POA: Diagnosis not present

## 2023-06-15 LAB — CBC WITH DIFFERENTIAL/PLATELET
Basophils Absolute: 0.1 10*3/uL (ref 0.0–0.1)
Basophils Relative: 0.9 % (ref 0.0–3.0)
Eosinophils Absolute: 0.9 10*3/uL — ABNORMAL HIGH (ref 0.0–0.7)
Eosinophils Relative: 6.4 % — ABNORMAL HIGH (ref 0.0–5.0)
HCT: 41.6 % (ref 39.0–52.0)
Hemoglobin: 13.9 g/dL (ref 13.0–17.0)
Lymphocytes Relative: 27.6 % (ref 12.0–46.0)
Lymphs Abs: 3.7 10*3/uL (ref 0.7–4.0)
MCHC: 33.4 g/dL (ref 30.0–36.0)
MCV: 89.2 fL (ref 78.0–100.0)
Monocytes Absolute: 0.6 10*3/uL (ref 0.1–1.0)
Monocytes Relative: 4.7 % (ref 3.0–12.0)
Neutro Abs: 8.2 10*3/uL — ABNORMAL HIGH (ref 1.4–7.7)
Neutrophils Relative %: 60.4 % (ref 43.0–77.0)
Platelets: 565 10*3/uL — ABNORMAL HIGH (ref 150.0–400.0)
RBC: 4.66 Mil/uL (ref 4.22–5.81)
RDW: 13.8 % (ref 11.5–15.5)
WBC: 13.6 10*3/uL — ABNORMAL HIGH (ref 4.0–10.5)

## 2023-06-15 LAB — POTASSIUM: Potassium: 4.8 meq/L (ref 3.5–5.1)

## 2023-06-15 MED ORDER — LEVOFLOXACIN 500 MG PO TABS: 500.0000 mg | ORAL_TABLET | Freq: Every day | ORAL | 0 refills | Status: DC

## 2023-06-15 NOTE — Progress Notes (Signed)
Patient ID: [name], male    DOB: [birth year], 62 y.o.   MRN: [id number]  This visit was conducted in person.  BP 100/60 (BP Location: Left Arm, Patient Position: Sitting, Cuff Size: Large)   Pulse 87   Temp 98.1 F (36.7 C) (Temporal)   Ht 6\' 1"  (1.854 m)   Wt 282 lb 2 oz (128 kg)   SpO2 96%   BMI 37.22 kg/m    CC:  Chief Complaint  Patient presents with   Shortness of Breath        Pneumonia    Dx with PNA 06/06/23   Wheezing   Fever    101 last night   Cough    Subjective:   HPI: [name] is a 62 y.o. male presenting on 06/15/2023 for Shortness of Breath (/), Pneumonia (Dx with PNA 06/06/23), Wheezing, Fever (101 last night), and Cough  Seen June 06, 2023 by Audria Nine for acute cough.  Dx with right infrahilar PNA on CXR:  Negative COVID test  BNP low.. no clear heart failure component.  Treated with Augmentin, azithromycin and pred taper.  Potassium was high.. due for re-check. Not high in diet, no supplement.   He felt better on antibiotics, breathing improved some, less cough.... now more SOB returned, congestion in lasty 24- 48 hours.  Fever recurred last night... 101.0 F.   He is keeping up with liquids.  Early May 15, 2023 laparoscopic cholecystectomy ... Well healing.. no abd pain.   Has DM.  Former smoker, remote  Relevant past medical, surgical, family and social history reviewed and updated as indicated. Interim medical history since our last visit reviewed. Allergies and medications reviewed and updated. Outpatient Medications Prior to Visit  Medication Sig Dispense Refill   acetaminophen (TYLENOL) 500 MG tablet Take 1,000 mg by mouth once as needed for moderate pain (pain score 4-6).     albuterol (PROVENTIL) (2.5 MG/3ML) 0.083% nebulizer solution Take 3 mLs (2.5 mg total) by nebulization every 6 (six) hours as needed for wheezing or shortness of breath. 150 mL 1   albuterol (VENTOLIN HFA) 108 (90 Base) MCG/ACT inhaler  Inhale 1-2 puffs into the lungs every 6 (six) hours as needed for wheezing or shortness of breath. 1 each 1   aspirin EC 81 MG tablet Take 81 mg by mouth daily.     Continuous Glucose Sensor (DEXCOM G7 SENSOR) MISC See admin instructions.     cyclobenzaprine (FLEXERIL) 5 MG tablet TAKE ONE TABLET BY MOUTH EVERY NIGHT AT BEDTIME 30 tablet 1   Dulaglutide (TRULICITY) 4.5 MG/0.5ML SOPN Inject 4.5 mg into the skin once a week. Saturday or Sunday- Last injection was 05/06/2023.     Evolocumab (REPATHA SURECLICK) 140 MG/ML SOAJ Inject 140 mg into the skin every 14 (fourteen) days. 6 mL 3   ezetimibe (ZETIA) 10 MG tablet Take 1 tablet (10 mg total) by mouth daily. 90 tablet 3   FLUoxetine (PROZAC) 20 MG tablet TAKE ONE TABLET BY MOUTH ONCE A DAY 90 tablet 1   gabapentin (NEURONTIN) 100 MG capsule TAKE ONE TO THREE CAPSULES BY MOUTH THREE TIMES DAILY AS NEEDED 270 capsule 1   insulin aspart (NOVOLOG FLEXPEN) 100 UNIT/ML FlexPen Per sliding scale     insulin degludec (TRESIBA) 200 UNIT/ML FlexTouch Pen Inject 32 Units into the skin every evening.     JARDIANCE 25 MG TABS tablet TAKE 1 TABLET BY MOUTH ONCE A DAY 30 tablet 2   metFORMIN (  GLUCOPHAGE) 1000 MG tablet TAKE 1 TABLET (1,000 MG TOTAL) BY MOUTH 2 (TWO) TIMES DAILY WITH A MEAL. 180 tablet 2   Multiple Vitamin (MULTIVITAMIN) capsule Take 1 capsule by mouth daily.     nebivolol (BYSTOLIC) 5 MG tablet TAKE ONE TABLET (5 MG TOTAL) BY MOUTH DAILY. PLEASE KEEP SCHEDULED APPOINTMENT FOR FUTURE REFILLS. THANK YOU. 90 tablet 2   omeprazole (PRILOSEC) 20 MG capsule Take 20 mg by mouth daily.     ondansetron (ZOFRAN) 4 MG tablet Take 1 tablet (4 mg total) by mouth 4 (four) times daily as needed for nausea. 20 tablet 1   Vitamin D, Ergocalciferol, (DRISDOL) 1.25 MG (50000 UNIT) CAPS capsule Take 1 capsule (50,000 Units total) by mouth every 7 (seven) days. 13 capsule 0   nitroGLYCERIN (NITROSTAT) 0.4 MG SL tablet Place 1 tablet (0.4 mg total) under the tongue  every 5 (five) minutes as needed for chest pain. 25 tablet 3   No facility-administered medications prior to visit.     Per HPI unless specifically indicated in ROS section below Review of Systems  Constitutional:  Negative for fatigue and fever.  HENT:  Positive for congestion. Negative for ear pain.   Eyes:  Negative for pain.  Respiratory:  Positive for cough and shortness of breath.   Cardiovascular:  Negative for chest pain, palpitations and leg swelling.  Gastrointestinal:  Negative for abdominal pain.  Genitourinary:  Negative for dysuria.  Musculoskeletal:  Negative for arthralgias.  Neurological:  Negative for syncope, light-headedness and headaches.  Psychiatric/Behavioral:  Negative for dysphoric mood.    Objective:  BP 100/60 (BP Location: Left Arm, Patient Position: Sitting, Cuff Size: Large)   Pulse 87   Temp 98.1 F (36.7 C) (Temporal)   Ht 6\' 1"  (1.854 m)   Wt 282 lb 2 oz (128 kg)   SpO2 96%   BMI 37.22 kg/m   Wt Readings from Last 3 Encounters:  06/27/23 281 lb (127.5 kg)  06/20/23 278 lb 3.5 oz (126.2 kg)  06/15/23 282 lb 2 oz (128 kg)      Physical Exam Constitutional:      General: He is not in acute distress.    Appearance: Normal appearance. He is well-developed. He is not ill-appearing or toxic-appearing.  HENT:     Head: Normocephalic and atraumatic.     Right Ear: Hearing, tympanic membrane, ear canal and external ear normal. No tenderness. No foreign body. Tympanic membrane is not retracted or bulging.     Left Ear: Hearing, tympanic membrane, ear canal and external ear normal. No tenderness. No foreign body. Tympanic membrane is not retracted or bulging.     Nose: Nose normal. No mucosal edema or rhinorrhea.     Right Sinus: No maxillary sinus tenderness or frontal sinus tenderness.     Left Sinus: No maxillary sinus tenderness or frontal sinus tenderness.     Mouth/Throat:     Dentition: Normal dentition. No dental caries.     Pharynx: Uvula  midline. No oropharyngeal exudate.     Tonsils: No tonsillar abscesses.  Eyes:     General: Lids are normal. Lids are everted, no foreign bodies appreciated.     Conjunctiva/sclera: Conjunctivae normal.     Pupils: Pupils are equal, round, and reactive to light.  Neck:     Thyroid: No thyroid mass or thyromegaly.     Vascular: No carotid bruit.     Trachea: Trachea and phonation normal.  Cardiovascular:     Rate and Rhythm:  Normal rate and regular rhythm.     Pulses: Normal pulses.     Heart sounds: Normal heart sounds, S1 normal and S2 normal. No murmur heard.    No gallop.  Pulmonary:     Effort: Pulmonary effort is normal. No respiratory distress.     Breath sounds: Normal breath sounds. No wheezing, rhonchi or rales.  Abdominal:     General: Bowel sounds are normal.     Palpations: Abdomen is soft.     Tenderness: There is no abdominal tenderness. There is no guarding or rebound.     Hernia: No hernia is present.  Musculoskeletal:     Cervical back: Normal range of motion and neck supple.  Skin:    General: Skin is warm and dry.     Findings: No rash.  Neurological:     Mental Status: He is alert.     Deep Tendon Reflexes: Reflexes are normal and symmetric.  Psychiatric:        Speech: Speech normal.        Behavior: Behavior normal.        Judgment: Judgment normal.       Results for orders placed or performed in visit on 06/15/23  CBC with Differential/Platelet   Collection Time: 06/15/23 12:35 PM  Result Value Ref Range   WBC 13.6 (H) 4.0 - 10.5 K/uL   RBC 4.66 4.22 - 5.81 Mil/uL   Hemoglobin 13.9 13.0 - 17.0 g/dL   HCT 82.9 56.2 - 13.0 %   MCV 89.2 78.0 - 100.0 fl   MCHC 33.4 30.0 - 36.0 g/dL   RDW 86.5 78.4 - 69.6 %   Platelets 565.0 (H) 150.0 - 400.0 K/uL   Neutrophils Relative % 60.4 43.0 - 77.0 %   Lymphocytes Relative 27.6 12.0 - 46.0 %   Monocytes Relative 4.7 3.0 - 12.0 %   Eosinophils Relative 6.4 (H) 0.0 - 5.0 %   Basophils Relative 0.9 0.0 - 3.0  %   Neutro Abs 8.2 (H) 1.4 - 7.7 K/uL   Lymphs Abs 3.7 0.7 - 4.0 K/uL   Monocytes Absolute 0.6 0.1 - 1.0 K/uL   Eosinophils Absolute 0.9 (H) 0.0 - 0.7 K/uL   Basophils Absolute 0.1 0.0 - 0.1 K/uL  Potassium   Collection Time: 06/15/23 12:35 PM  Result Value Ref Range   Potassium 4.8 3.5 - 5.1 mEq/L    Assessment and Plan  SOBOE (shortness of breath on exertion) Assessment & Plan: Acute, concern for pneumonia treated witha current antibiotic.  Will broaden course of antibiotics to Levaquin. Reviewed expected course and possible side effects.    Call if continued fever, new abdominal pain or shortness of breath.  If severe shortness of breath.. go to ER.   Pneumonia of right lung due to infectious organism, unspecified part of lung -     CBC with Differential/Platelet  Hyperkalemia Assessment & Plan: Due for reevaluation.  Orders: -     Potassium    No follow-ups on file.   Kerby Nora, MD

## 2023-06-15 NOTE — Telephone Encounter (Signed)
See phone note

## 2023-06-15 NOTE — Telephone Encounter (Signed)
Agree. Thanks

## 2023-06-15 NOTE — Patient Instructions (Addendum)
Please stop at the lab to have labs drawn.   Start  levaquin course.  Call if continued fever, new abdominal pain or shortness of breath.  If severe shortness of breath.. go to ER.

## 2023-06-15 NOTE — Telephone Encounter (Signed)
I spoke with pt and pts wife and last night did have some wheezing pt said he has pneumonia and has finished his meds. No CP but does have SOB upon exertion.pt does not think has fever this morning and last took tylenol last night. Pt said he does not need to go to ED and pt scheduled appt with Dr Ermalene Searing 06/15/23 at 11:20 with UC & ED precautions and pts wife voiced understanding. Sending note to Dr Ermalene Searing and Lorain Childes to Dr Para March as PCP. Pt is scheduled for labs this morning and will go by lab after seeing Dr Ermalene Searing this morning.

## 2023-06-19 ENCOUNTER — Encounter (HOSPITAL_BASED_OUTPATIENT_CLINIC_OR_DEPARTMENT_OTHER): Payer: Self-pay

## 2023-06-19 ENCOUNTER — Other Ambulatory Visit: Payer: Self-pay

## 2023-06-19 ENCOUNTER — Emergency Department (HOSPITAL_BASED_OUTPATIENT_CLINIC_OR_DEPARTMENT_OTHER): Payer: BC Managed Care – PPO

## 2023-06-19 ENCOUNTER — Observation Stay (HOSPITAL_BASED_OUTPATIENT_CLINIC_OR_DEPARTMENT_OTHER)
Admission: EM | Admit: 2023-06-19 | Discharge: 2023-06-20 | Disposition: A | Discharge status: Home / Self Care | Payer: BC Managed Care – PPO | Attending: Internal Medicine | Admitting: Internal Medicine

## 2023-06-19 ENCOUNTER — Inpatient Hospital Stay (HOSPITAL_COMMUNITY): Payer: BC Managed Care – PPO

## 2023-06-19 ENCOUNTER — Emergency Department (HOSPITAL_BASED_OUTPATIENT_CLINIC_OR_DEPARTMENT_OTHER): Payer: BC Managed Care – PPO | Admitting: Radiology

## 2023-06-19 DIAGNOSIS — Z833 Family history of diabetes mellitus: Secondary | ICD-10-CM | POA: Insufficient documentation

## 2023-06-19 DIAGNOSIS — I10 Essential (primary) hypertension: Secondary | ICD-10-CM | POA: Diagnosis present

## 2023-06-19 DIAGNOSIS — Z9884 Bariatric surgery status: Secondary | ICD-10-CM | POA: Insufficient documentation

## 2023-06-19 DIAGNOSIS — I110 Hypertensive heart disease with heart failure: Secondary | ICD-10-CM | POA: Diagnosis not present

## 2023-06-19 DIAGNOSIS — E1143 Type 2 diabetes mellitus with diabetic autonomic (poly)neuropathy: Secondary | ICD-10-CM | POA: Diagnosis not present

## 2023-06-19 DIAGNOSIS — G4733 Obstructive sleep apnea (adult) (pediatric): Secondary | ICD-10-CM | POA: Diagnosis not present

## 2023-06-19 DIAGNOSIS — Z7985 Long-term (current) use of injectable non-insulin antidiabetic drugs: Secondary | ICD-10-CM | POA: Insufficient documentation

## 2023-06-19 DIAGNOSIS — E669 Obesity, unspecified: Secondary | ICD-10-CM | POA: Insufficient documentation

## 2023-06-19 DIAGNOSIS — E785 Hyperlipidemia, unspecified: Secondary | ICD-10-CM | POA: Insufficient documentation

## 2023-06-19 DIAGNOSIS — I2510 Atherosclerotic heart disease of native coronary artery without angina pectoris: Secondary | ICD-10-CM | POA: Diagnosis not present

## 2023-06-19 DIAGNOSIS — Z9049 Acquired absence of other specified parts of digestive tract: Secondary | ICD-10-CM | POA: Diagnosis not present

## 2023-06-19 DIAGNOSIS — I5032 Chronic diastolic (congestive) heart failure: Secondary | ICD-10-CM | POA: Diagnosis not present

## 2023-06-19 DIAGNOSIS — Z7982 Long term (current) use of aspirin: Secondary | ICD-10-CM | POA: Diagnosis not present

## 2023-06-19 DIAGNOSIS — Z8249 Family history of ischemic heart disease and other diseases of the circulatory system: Secondary | ICD-10-CM | POA: Diagnosis not present

## 2023-06-19 DIAGNOSIS — I700 Atherosclerosis of aorta: Secondary | ICD-10-CM | POA: Insufficient documentation

## 2023-06-19 DIAGNOSIS — K9181 Other intraoperative complications of digestive system: Principal | ICD-10-CM | POA: Insufficient documentation

## 2023-06-19 DIAGNOSIS — Z794 Long term (current) use of insulin: Secondary | ICD-10-CM | POA: Diagnosis not present

## 2023-06-19 DIAGNOSIS — K810 Acute cholecystitis: Secondary | ICD-10-CM | POA: Diagnosis not present

## 2023-06-19 DIAGNOSIS — Z87891 Personal history of nicotine dependence: Secondary | ICD-10-CM | POA: Diagnosis not present

## 2023-06-19 DIAGNOSIS — Z681 Body mass index (BMI) 19 or less, adult: Secondary | ICD-10-CM | POA: Insufficient documentation

## 2023-06-19 DIAGNOSIS — Z7984 Long term (current) use of oral hypoglycemic drugs: Secondary | ICD-10-CM | POA: Diagnosis not present

## 2023-06-19 DIAGNOSIS — K3184 Gastroparesis: Secondary | ICD-10-CM | POA: Diagnosis present

## 2023-06-19 DIAGNOSIS — Z79899 Other long term (current) drug therapy: Secondary | ICD-10-CM | POA: Diagnosis not present

## 2023-06-19 DIAGNOSIS — Z20822 Contact with and (suspected) exposure to covid-19: Secondary | ICD-10-CM | POA: Diagnosis not present

## 2023-06-19 DIAGNOSIS — E119 Type 2 diabetes mellitus without complications: Secondary | ICD-10-CM

## 2023-06-19 DIAGNOSIS — K651 Peritoneal abscess: Principal | ICD-10-CM

## 2023-06-19 DIAGNOSIS — Z6839 Body mass index (BMI) 39.0-39.9, adult: Secondary | ICD-10-CM | POA: Diagnosis not present

## 2023-06-19 DIAGNOSIS — R188 Other ascites: Secondary | ICD-10-CM | POA: Diagnosis not present

## 2023-06-19 DIAGNOSIS — R112 Nausea with vomiting, unspecified: Secondary | ICD-10-CM | POA: Diagnosis present

## 2023-06-19 LAB — GLUCOSE, CAPILLARY
Glucose-Capillary: 60 mg/dL — ABNORMAL LOW (ref 70–99)
Glucose-Capillary: 44 mg/dL — CL (ref 70–99)
Glucose-Capillary: 54 mg/dL — ABNORMAL LOW (ref 70–99)
Glucose-Capillary: 59 mg/dL — ABNORMAL LOW (ref 70–99)
Glucose-Capillary: 58 mg/dL — ABNORMAL LOW (ref 70–99)
Glucose-Capillary: 79 mg/dL (ref 70–99)
Glucose-Capillary: 154 mg/dL — ABNORMAL HIGH (ref 70–99)

## 2023-06-19 LAB — CBC
HCT: 38.8 % — ABNORMAL LOW (ref 39.0–52.0)
Hemoglobin: 12.9 g/dL — ABNORMAL LOW (ref 13.0–17.0)
MCH: 28.9 pg (ref 26.0–34.0)
MCHC: 33.2 g/dL (ref 30.0–36.0)
MCV: 86.8 fL (ref 80.0–100.0)
Platelets: 468 10*3/uL — ABNORMAL HIGH (ref 150–400)
RBC: 4.47 MIL/uL (ref 4.22–5.81)
RDW: 13 % (ref 11.5–15.5)
WBC: 11.7 10*3/uL — ABNORMAL HIGH (ref 4.0–10.5)
nRBC: 0 % (ref 0.0–0.2)

## 2023-06-19 LAB — COMPREHENSIVE METABOLIC PANEL
ALT: 7 U/L (ref 0–44)
AST: 10 U/L — ABNORMAL LOW (ref 15–41)
Albumin: 3.7 g/dL (ref 3.5–5.0)
Alkaline Phosphatase: 108 U/L (ref 38–126)
Anion gap: 11 (ref 5–15)
BUN: 20 mg/dL (ref 8–23)
CO2: 27 mmol/L (ref 22–32)
Calcium: 9.7 mg/dL (ref 8.9–10.3)
Chloride: 99 mmol/L (ref 98–111)
Creatinine, Ser: 1.05 mg/dL (ref 0.61–1.24)
GFR, Estimated: >60 mL/min (ref >60)
Glucose, Bld: 84 mg/dL (ref 70–99)
Potassium: 4.6 mmol/L (ref 3.5–5.1)
Sodium: 137 mmol/L (ref 135–145)
Total Bilirubin: 0.5 mg/dL (ref <1.2)
Total Protein: 7.5 g/dL (ref 6.5–8.1)

## 2023-06-19 LAB — LACTIC ACID, PLASMA
Lactic Acid, Venous: 0.7 mmol/L (ref 0.5–1.9)
Lactic Acid, Venous: 1.1 mmol/L (ref 0.5–1.9)

## 2023-06-19 LAB — RESP PANEL BY RT-PCR (RSV, FLU A&B, COVID)  RVPGX2
Influenza A by PCR: NEGATIVE
Influenza B by PCR: NEGATIVE
Resp Syncytial Virus by PCR: NEGATIVE
SARS Coronavirus 2 by RT PCR: NEGATIVE

## 2023-06-19 LAB — D-DIMER, QUANTITATIVE: D-Dimer, Quant: 1.04 ug{FEU}/mL — ABNORMAL HIGH (ref 0.00–0.50)

## 2023-06-19 LAB — TROPONIN I (HIGH SENSITIVITY): Troponin I (High Sensitivity): 8 ng/L (ref <18)

## 2023-06-19 LAB — LIPASE, BLOOD: Lipase: 11 U/L (ref 11–51)

## 2023-06-19 LAB — BRAIN NATRIURETIC PEPTIDE: B Natriuretic Peptide: 23 pg/mL (ref 0.0–100.0)

## 2023-06-19 MED ORDER — ALBUTEROL SULFATE (2.5 MG/3ML) 0.083% IN NEBU: 3.0000 mL | INHALATION_SOLUTION | Freq: Four times a day (QID) | RESPIRATORY_TRACT | Status: DC | PRN

## 2023-06-19 MED ORDER — ACETAMINOPHEN 325 MG PO TABS
650.0000 mg | ORAL_TABLET | Freq: Four times a day (QID) | ORAL | Status: DC | PRN
  Administered 2023-06-19: 650 mg via ORAL
  Filled 2023-06-19: qty 2

## 2023-06-19 MED ORDER — INSULIN ASPART 100 UNIT/ML IJ SOLN: 0.0000 [IU] | Freq: Three times a day (TID) | INTRAMUSCULAR | Status: DC

## 2023-06-19 MED ORDER — FENTANYL CITRATE (PF) 100 MCG/2ML IJ SOLN
INTRAMUSCULAR | Status: AC | PRN
  Administered 2023-06-19 (×2): 25 ug via INTRAVENOUS

## 2023-06-19 MED ORDER — PANTOPRAZOLE SODIUM 40 MG PO TBEC
40.0000 mg | DELAYED_RELEASE_TABLET | Freq: Every day | ORAL | Status: DC
  Administered 2023-06-20: 40 mg via ORAL
  Filled 2023-06-19: qty 1

## 2023-06-19 MED ORDER — FLUOXETINE HCL 20 MG PO CAPS
20.0000 mg | ORAL_CAPSULE | Freq: Every day | ORAL | Status: DC
  Administered 2023-06-20: 20 mg via ORAL
  Filled 2023-06-19 (×2): qty 1

## 2023-06-19 MED ORDER — DEXTROSE 50 % IV SOLN
INTRAVENOUS | Status: AC
  Filled 2023-06-19: qty 50

## 2023-06-19 MED ORDER — LIDOCAINE HCL 1 % IJ SOLN
10.0000 mL | Freq: Once | INTRAMUSCULAR | Status: AC
  Administered 2023-06-19: 10 mL via INTRADERMAL

## 2023-06-19 MED ORDER — PIPERACILLIN-TAZOBACTAM 3.375 G IVPB
3.3750 g | Freq: Three times a day (TID) | INTRAVENOUS | Status: DC
  Administered 2023-06-19 – 2023-06-20 (×3): 3.375 g via INTRAVENOUS
  Filled 2023-06-19 (×3): qty 50

## 2023-06-19 MED ORDER — NEBIVOLOL HCL 5 MG PO TABS
5.0000 mg | ORAL_TABLET | Freq: Every day | ORAL | Status: DC
  Administered 2023-06-20: 5 mg via ORAL
  Filled 2023-06-19: qty 1

## 2023-06-19 MED ORDER — SODIUM CHLORIDE 0.9% FLUSH
3.0000 mL | Freq: Two times a day (BID) | INTRAVENOUS | Status: DC
  Administered 2023-06-19 – 2023-06-20 (×2): 3 mL via INTRAVENOUS

## 2023-06-19 MED ORDER — MIDAZOLAM HCL 2 MG/2ML IJ SOLN
INTRAMUSCULAR | Status: AC | PRN
  Administered 2023-06-19 (×3): 1 mg via INTRAVENOUS

## 2023-06-19 MED ORDER — GABAPENTIN 100 MG PO CAPS: 100.0000 mg | ORAL_CAPSULE | Freq: Three times a day (TID) | ORAL | Status: DC | PRN

## 2023-06-19 MED ORDER — MAGNESIUM HYDROXIDE 400 MG/5ML PO SUSP: 30.0000 mL | Freq: Every day | ORAL | Status: DC | PRN

## 2023-06-19 MED ORDER — PIPERACILLIN-TAZOBACTAM 3.375 G IVPB 30 MIN
3.3750 g | Freq: Once | INTRAVENOUS | Status: AC
  Administered 2023-06-19: 3.375 g via INTRAVENOUS
  Filled 2023-06-19: qty 50

## 2023-06-19 MED ORDER — SODIUM CHLORIDE 0.9% FLUSH
5.0000 mL | Freq: Three times a day (TID) | INTRAVENOUS | Status: DC
  Administered 2023-06-19 – 2023-06-20 (×4): 5 mL

## 2023-06-19 MED ORDER — IOHEXOL 350 MG/ML SOLN
100.0000 mL | Freq: Once | INTRAVENOUS | Status: AC | PRN
  Administered 2023-06-19: 100 mL via INTRAVENOUS

## 2023-06-19 MED ORDER — MIDAZOLAM HCL 2 MG/2ML IJ SOLN
INTRAMUSCULAR | Status: AC
  Filled 2023-06-19: qty 4

## 2023-06-19 MED ORDER — ORAL CARE MOUTH RINSE: 15.0000 mL | OROMUCOSAL | Status: DC | PRN

## 2023-06-19 MED ORDER — INSULIN GLARGINE-YFGN 100 UNIT/ML ~~LOC~~ SOLN
20.0000 [IU] | Freq: Every day | SUBCUTANEOUS | Status: DC
  Filled 2023-06-19: qty 0.2

## 2023-06-19 MED ORDER — ONDANSETRON HCL 4 MG/2ML IJ SOLN
INTRAMUSCULAR | Status: AC
  Filled 2023-06-19: qty 2

## 2023-06-19 MED ORDER — EZETIMIBE 10 MG PO TABS
10.0000 mg | ORAL_TABLET | Freq: Every day | ORAL | Status: DC
  Administered 2023-06-20: 10 mg via ORAL
  Filled 2023-06-19: qty 1

## 2023-06-19 MED ORDER — ACETAMINOPHEN 650 MG RE SUPP: 650.0000 mg | Freq: Four times a day (QID) | RECTAL | Status: DC | PRN

## 2023-06-19 MED ORDER — FENTANYL CITRATE (PF) 100 MCG/2ML IJ SOLN
INTRAMUSCULAR | Status: AC
  Filled 2023-06-19: qty 4

## 2023-06-19 MED ORDER — ONDANSETRON HCL 4 MG/2ML IJ SOLN
INTRAMUSCULAR | Status: AC | PRN
  Administered 2023-06-19: 4 mg via INTRAVENOUS

## 2023-06-19 MED ORDER — GLUCOSE 40 % PO GEL
1.0000 | ORAL | Status: AC
Start: 2023-06-19 — End: 2023-06-19
  Administered 2023-06-19: 31 g via ORAL
  Filled 2023-06-19: qty 1.21

## 2023-06-19 MED ORDER — DEXTROSE 50 % IV SOLN
1.0000 | Freq: Once | INTRAVENOUS | Status: AC
  Filled 2023-06-19: qty 50

## 2023-06-19 MED ORDER — ASPIRIN 81 MG PO TBEC
81.0000 mg | DELAYED_RELEASE_TABLET | Freq: Every day | ORAL | Status: DC
  Administered 2023-06-20: 81 mg via ORAL
  Filled 2023-06-19: qty 1

## 2023-06-19 NOTE — Consult Note (Signed)
[name] 1960-12-22  [id number].    Requesting MD: Maple Hudson, MD Chief Complaint/Reason for Consult: right upper quadrant fluid collection s/p cholecystectomy 05/15/2023  HPI:  [name] is a 62 y/o M with a history of cholecystitis with empyema of the gallbladder s/p laparoscopic cholecystectomy 05/15/23 who presents [facility] for management of RUQ fluid collection. He was seen by Dr. Janee Morn in our office on 12/5 and, at that time, was recovering well from surgery - tolerating PO, having bowel movements, pain improving. He was seen the same day by a medical physician due to SOB and was prescribed a second course of antibiotics for pneumonia (per patient report: Augmentin/steroids followed by Levaquin). He presented to [facility] today due to general malaise, nausea and vomiting that started yesterday. He denies abdominal pain, fever, chills, diarrhea, melena, hematochezia, hematemesis, or urinary sxs.   Substance use: former smoker, occasional alcohol use no other drug use reported. Blood thinners: none Surgical history: lap chole 05/15/23, lap band 2011 by Dr. Daphine Deutscher  Other significant PMH: HTN, HLD, DM2, CAD   ROS: Review of Systems  All other systems reviewed and are negative.   Family History  Problem Relation Age of Onset   Cancer Father        skin   Heart disease Father 61       AMI first age 39; stents.   Diabetes Father    Hypertension Father    Hyperlipidemia Father    Peripheral Artery Disease Father    Parkinson's disease Father    Cancer Paternal Grandfather        skin   Diabetes Mother    Arthritis Mother        total hip replacement   Kidney failure Mother    Depression Sister    Diabetes Sister    Cancer Maternal Uncle        lymphoma   Prostate cancer Maternal Uncle    Colon cancer Neg Hx     Past Medical History:  Diagnosis Date   Allergic rhinitis, cause unspecified    Anxiety    Chronic diastolic CHF  (congestive heart failure) (HCC)    a. 03/2017 Echo: EF 55%, Gr1DD, nl RV fxn.   Degeneration of intervertebral disc, site unspecified    Diabetes mellitus    Type II   Hyperlipidemia    Hypertension    Impotence of organic origin    Joint pain    Nonobstructive CAD    a. 03/2014 Cath: LM nl, LAD nl, D1 30ost/p, LCX nl, RCA 30d, EF 60%; b. 03/2017 MV: EF 56%, small, moderate basal inf/mid inf defect->prob inf thinning vs mild ischemia-->low-risk; c. 08/2021 Cath: LM nl, LAD 20ost/p, LCX large, 30m, RCA large, 30d, EF 50-55%. Nl filling pressures-->Med rx.   Obesity    Obesity, unspecified    Obstructive sleep apnea (adult) (pediatric)    Other chronic allergic conjunctivitis    Personal history of colonic polyps    SOB (shortness of breath)     Past Surgical History:  Procedure Laterality Date   admission  01/2009   [facility].  Syncopal event with nausea, diaphoresis, hypertension.  D-dimer elevated at 2.54 with negative CT chest. and VQ scan.  Cardiac enzymes negative x 3.  Labs normal.  Cardiology consult---cardiolite abn; cath, 2D-echo, holter monitor unrevealing.  SE H&V.   Allergy Consult  07/11/2010   SOB, itching, facial tingling, hypotension.  Allergy testing: no food allergies; +reaction to  tree pollen, grass pollen, dust mites, mold.  Avoid diclofenac and similar products.  [name].   APPENDECTOMY     arthroscopy of knee  1980   BACK SURGERY     x3   CATARACT EXTRACTION Left    CHOLECYSTECTOMY N/A 05/15/2023   Procedure: LAPAROSCOPIC CHOLECYSTECTOMY;  Surgeon: Violeta Gelinas, MD;  Location: [facility] OR;  Service: General;  Laterality: N/A;   COLONOSCOPY  07/11/2009   single polyp; repeat in 5 years.  [name].   GI consult  07/11/2010   CT chest in ED (gastric thickening).  [name].  No EGD warranted due to lack of symptoms.   LAPAROSCOPIC GASTRIC BANDING  09/15/09   LEFT HEART CATHETERIZATION WITH CORONARY ANGIOGRAM N/A 04/02/2014   Procedure: LEFT HEART CATHETERIZATION WITH CORONARY  ANGIOGRAM;  Surgeon: Lesleigh Noe, MD;  Location: [facility] CATH LAB;  Service: Cardiovascular;  Laterality: N/A;   QUADRICEPS REPAIR     left   QUADRICEPS TENDON REPAIR     left   RETINAL DETACHMENT SURGERY     RIGHT/LEFT HEART CATH AND CORONARY ANGIOGRAPHY N/A 08/13/2021   Procedure: RIGHT/LEFT HEART CATH AND CORONARY ANGIOGRAPHY;  Surgeon: Kathleene Hazel, MD;  Location: [facility] INVASIVE CV LAB;  Service: Cardiovascular;  Laterality: N/A;   ROTATOR CUFF REPAIR     right   Sleep study  07/11/2010   repeat sleep study due to weight loss.  Severe OSA; CPAP titration to 8 cm.     SPINE SURGERY     TONSILLECTOMY AND ADENOIDECTOMY  1960    Social History:  reports that he has quit smoking. His smoking use included cigarettes. He has a 0.5 pack-year smoking history. He has never used smokeless tobacco. He reports current alcohol use. He reports that he does not use drugs.  Allergies:  Allergies  Allergen Reactions   Diclofenac Sodium Shortness Of Breath, Itching and Other (See Comments)    Hypotension    Statins Shortness Of Breath   Voltaren [Diclofenac Sodium] Anaphylaxis   Lipitor [Atorvastatin]     Myalgias at 80mg  dose   Metoprolol     Lightheaded/dizzy/SOB.     Ramipril     Lightheaded/dizzy/SOB.      (Not in a hospital admission)    Physical Exam: Blood pressure 120/71, pulse 98, temperature 97.7 F (36.5 C), temperature source Oral, resp. rate 17, height 6\' 1"  (1.854 m), weight 128 kg, SpO2 97%. General: Pleasant white male laying on hospital bed, appears stated age, NAD. HEENT: head -normocephalic, atraumatic; Eyes: PERRLA, no conjunctival injection; Ears- no external lesions or tenderness Neck- Trachea is midline CV- RRR, no lower extremity edema  Pulm- breathing is non-labored ORA Abd- soft, non-tender,  without guarding, incisions c/d/I without cellulitis  GU- deferred  MSK- UE/LE symmetrical, no cyanosis, clubbing, or edema. Neuro- non-focal exam Psych- Alert  and Oriented x3 with appropriate affect Skin: warm and dry, no rashes or lesions   Results for orders placed or performed during the hospital encounter of 06/19/23 (from the past 48 hour(s))  CBC     Status: Abnormal   Collection Time: 06/19/23  8:03 AM  Result Value Ref Range   WBC 11.7 (H) 4.0 - 10.5 K/uL   RBC 4.47 4.22 - 5.81 MIL/uL   Hemoglobin 12.9 (L) 13.0 - 17.0 g/dL   HCT 16.1 (L) 09.6 - 04.5 %   MCV 86.8 80.0 - 100.0 fL   MCH 28.9 26.0 - 34.0 pg   MCHC 33.2 30.0 - 36.0 g/dL   RDW  13.0 11.5 - 15.5 %   Platelets 468 (H) 150 - 400 K/uL   nRBC 0.0 0.0 - 0.2 %    Comment: Performed at [facility], [address] [place], [place] [address]  Comprehensive metabolic panel     Status: Abnormal   Collection Time: 06/19/23  8:03 AM  Result Value Ref Range   Sodium 137 135 - 145 mmol/L   Potassium 4.6 3.5 - 5.1 mmol/L   Chloride 99 98 - 111 mmol/L   CO2 27 22 - 32 mmol/L   Glucose, Bld 84 70 - 99 mg/dL    Comment: Glucose reference range applies only to samples taken after fasting for at least 8 hours.   BUN 20 8 - 23 mg/dL   Creatinine, Ser 6.38 0.61 - 1.24 mg/dL   Calcium 9.7 8.9 - 75.6 mg/dL   Total Protein 7.5 6.5 - 8.1 g/dL   Albumin 3.7 3.5 - 5.0 g/dL   AST 10 (L) 15 - 41 U/L   ALT 7 0 - 44 U/L   Alkaline Phosphatase 108 38 - 126 U/L   Total Bilirubin 0.5 <1.2 mg/dL   GFR, Estimated >43 >32 mL/min    Comment: (NOTE) Calculated using the CKD-EPI Creatinine Equation (2021)    Anion gap 11 5 - 15    Comment: Performed at [facility], [address] [place], [place] [address]  Lipase, blood     Status: None   Collection Time: 06/19/23  8:03 AM  Result Value Ref Range   Lipase 11 11 - 51 U/L    Comment: Performed at [facility], [address] [place], [place] [address]  Troponin I (High Sensitivity)     Status: None   Collection Time: 06/19/23  8:03 AM  Result Value Ref Range   Troponin I (High  Sensitivity) 8 <18 ng/L    Comment: (NOTE) Elevated high sensitivity troponin I (hsTnI) values and significant  changes across serial measurements may suggest ACS but many other  chronic and acute conditions are known to elevate hsTnI results.  Refer to the "Links" section for chest pain algorithms and additional  guidance. Performed at [facility], [address] [place], [place] [address]   D-dimer, quantitative     Status: Abnormal   Collection Time: 06/19/23  8:16 AM  Result Value Ref Range   D-Dimer, Quant 1.04 (H) 0.00 - 0.50 ug/mL-FEU    Comment: (NOTE) At the manufacturer cut-off value of 0.5 g/mL FEU, this assay has a negative predictive value of 95-100%.This assay is intended for use in conjunction with a clinical pretest probability (PTP) assessment model to exclude pulmonary embolism (PE) and deep venous thrombosis (DVT) in outpatients suspected of PE or DVT. Results should be correlated with clinical presentation. Performed at [facility], [address], [place], [place] [address]   Brain natriuretic peptide     Status: None   Collection Time: 06/19/23  8:16 AM  Result Value Ref Range   B Natriuretic Peptide 23.0 0.0 - 100.0 pg/mL    Comment: Performed at [facility], [address] [place], [place] [address]  Resp panel by RT-PCR (RSV, Flu A&B, Covid) Anterior Nasal Swab     Status: None   Collection Time: 06/19/23  8:26 AM   Specimen: Anterior Nasal Swab  Result Value Ref Range   SARS Coronavirus 2 by RT PCR NEGATIVE NEGATIVE    Comment: (NOTE) SARS-CoV-2 target nucleic acids are NOT DETECTED.  The  SARS-CoV-2 RNA is generally detectable in upper respiratory specimens during the acute phase of infection. The lowest concentration of SARS-CoV-2 viral copies this assay can detect is 138 copies/mL. A negative result does not preclude SARS-Cov-2 infection and should not be used as the sole basis for  treatment or other patient management decisions. A negative result may occur with  improper specimen collection/handling, submission of specimen other than nasopharyngeal swab, presence of viral mutation(s) within the areas targeted by this assay, and inadequate number of viral copies(<138 copies/mL). A negative result must be combined with clinical observations, patient history, and epidemiological information. The expected result is Negative.  Fact Sheet for Patients:  BloggerCourse.com  Fact Sheet for Healthcare Providers:  SeriousBroker.it  This test is no t yet approved or cleared by the [place] FDA and  has been authorized for detection and/or diagnosis of SARS-CoV-2 by FDA under an Emergency Use Authorization (EUA). This EUA will remain  in effect (meaning this test can be used) for the duration of the COVID-19 declaration under Section 564(b)(1) of the Act, 21 U.S.C.section 360bbb-3(b)(1), unless the authorization is terminated  or revoked sooner.       Influenza A by PCR NEGATIVE NEGATIVE   Influenza B by PCR NEGATIVE NEGATIVE    Comment: (NOTE) The Xpert Xpress SARS-CoV-2/FLU/RSV plus assay is intended as an aid in the diagnosis of influenza from Nasopharyngeal swab specimens and should not be used as a sole basis for treatment. Nasal washings and aspirates are unacceptable for Xpert Xpress SARS-CoV-2/FLU/RSV testing.  Fact Sheet for Patients: BloggerCourse.com  Fact Sheet for Healthcare Providers: SeriousBroker.it  This test is not yet approved or cleared by the [place] FDA and has been authorized for detection and/or diagnosis of SARS-CoV-2 by FDA under an Emergency Use Authorization (EUA). This EUA will remain in effect (meaning this test can be used) for the duration of the COVID-19 declaration under Section 564(b)(1) of the Act, 21 U.S.C. section  360bbb-3(b)(1), unless the authorization is terminated or revoked.     Resp Syncytial Virus by PCR NEGATIVE NEGATIVE    Comment: (NOTE) Fact Sheet for Patients: BloggerCourse.com  Fact Sheet for Healthcare Providers: SeriousBroker.it  This test is not yet approved or cleared by the [place] FDA and has been authorized for detection and/or diagnosis of SARS-CoV-2 by FDA under an Emergency Use Authorization (EUA). This EUA will remain in effect (meaning this test can be used) for the duration of the COVID-19 declaration under Section 564(b)(1) of the Act, 21 U.S.C. section 360bbb-3(b)(1), unless the authorization is terminated or revoked.  Performed at [facility], [address] [place], [place] [address]   Lactic acid, plasma     Status: None   Collection Time: 06/19/23  9:16 AM  Result Value Ref Range   Lactic Acid, Venous 0.7 0.5 - 1.9 mmol/L    Comment: Performed at [facility], [address] [place], [place] [address]   CT Angio Chest PE W and/or Wo Contrast  Result Date: 06/19/2023 CLINICAL DATA:  PE suspected, nausea, vomiting, abdominal pain, recent cholecystectomy EXAM: CT ANGIOGRAPHY CHEST CT ABDOMEN AND PELVIS WITH CONTRAST TECHNIQUE: Multidetector CT imaging of the chest was performed using the standard protocol during bolus administration of intravenous contrast. Multiplanar CT image reconstructions and MIPs were obtained to evaluate the vascular anatomy. Multidetector CT imaging of the abdomen and pelvis was performed using the standard protocol during bolus administration of intravenous contrast. RADIATION DOSE REDUCTION: This exam was performed according to the  departmental dose-optimization program which includes automated exposure control, adjustment of the mA and/or kV according to patient size and/or use of iterative reconstruction technique. CONTRAST:  OMNIPAQUE  IOHEXOL 350 MG/ML SOLN COMPARISON:  CT abdomen pelvis, 05/09/2023, CT chest angiogram, 05/30/2014 FINDINGS: CT CHEST ANGIOGRAM FINDINGS Cardiovascular: Satisfactory opacification of the pulmonary arteries to the segmental level. No evidence of pulmonary embolism. Normal heart size. Left coronary artery calcifications. No pericardial effusion. Scattered aortic atherosclerosis. Mediastinum/Nodes: Enlarged bilateral mediastinal and hilar lymph nodes right hilar nodes measuring up to 2.5 x 1.5 cm (series 5, image 106). Thyroid gland, trachea, and esophagus demonstrate no significant findings. Lungs/Pleura: Diffuse bilateral bronchial wall thickening no pleural effusion or pneumothorax. Musculoskeletal: No chest wall abnormality. No acute osseous findings. Review of the MIP images confirms the above findings. CT ABDOMEN PELVIS FINDINGS Hepatobiliary: No focal liver abnormality is seen. Hepatic steatosis. Status post cholecystectomy. Large air and fluid collection in the gallbladder fossa measuring 8.2 x 5.7 cm (series 2, image 23). No biliary dilatation. Pancreas: Diffusely atrophic pancreas. No pancreatic ductal dilatation or surrounding inflammatory changes. Spleen: Normal in size without significant abnormality. Adrenals/Urinary Tract: Adrenal glands are unremarkable. Kidneys are normal, without renal calculi, solid lesion, or hydronephrosis. Bladder is unremarkable. Stomach/Bowel: Gastric lap band and reservoir. Stomach is otherwise within normal limits. Status post appendectomy. No evidence of bowel wall thickening, distention, or inflammatory changes. Vascular/Lymphatic: No significant vascular findings are present. No enlarged abdominal or pelvic lymph nodes. Reproductive: No mass or other significant abnormality. Other: No abdominal wall hernia or abnormality. No ascites. Musculoskeletal: No acute or significant osseous findings. IMPRESSION: 1. Status post cholecystectomy. Large air and fluid collection in the  gallbladder fossa measuring 8.2 x 5.7 cm, consistent with postoperative abscess and/or biliary leak. 2. Negative examination for pulmonary embolism. 3. Coronary artery disease. Aortic Atherosclerosis (ICD10-I70.0). Electronically Signed   By: Jearld Lesch M.D.   On: 06/19/2023 09:42   CT ABDOMEN PELVIS W CONTRAST  Result Date: 06/19/2023 CLINICAL DATA:  PE suspected, nausea, vomiting, abdominal pain, recent cholecystectomy EXAM: CT ANGIOGRAPHY CHEST CT ABDOMEN AND PELVIS WITH CONTRAST TECHNIQUE: Multidetector CT imaging of the chest was performed using the standard protocol during bolus administration of intravenous contrast. Multiplanar CT image reconstructions and MIPs were obtained to evaluate the vascular anatomy. Multidetector CT imaging of the abdomen and pelvis was performed using the standard protocol during bolus administration of intravenous contrast. RADIATION DOSE REDUCTION: This exam was performed according to the departmental dose-optimization program which includes automated exposure control, adjustment of the mA and/or kV according to patient size and/or use of iterative reconstruction technique. CONTRAST:  OMNIPAQUE IOHEXOL 350 MG/ML SOLN COMPARISON:  CT abdomen pelvis, 05/09/2023, CT chest angiogram, 05/30/2014 FINDINGS: CT CHEST ANGIOGRAM FINDINGS Cardiovascular: Satisfactory opacification of the pulmonary arteries to the segmental level. No evidence of pulmonary embolism. Normal heart size. Left coronary artery calcifications. No pericardial effusion. Scattered aortic atherosclerosis. Mediastinum/Nodes: Enlarged bilateral mediastinal and hilar lymph nodes right hilar nodes measuring up to 2.5 x 1.5 cm (series 5, image 106). Thyroid gland, trachea, and esophagus demonstrate no significant findings. Lungs/Pleura: Diffuse bilateral bronchial wall thickening no pleural effusion or pneumothorax. Musculoskeletal: No chest wall abnormality. No acute osseous findings. Review of the MIP images  confirms the above findings. CT ABDOMEN PELVIS FINDINGS Hepatobiliary: No focal liver abnormality is seen. Hepatic steatosis. Status post cholecystectomy. Large air and fluid collection in the gallbladder fossa measuring 8.2 x 5.7 cm (series 2, image 23). No biliary dilatation. Pancreas: Diffusely atrophic pancreas.  No pancreatic ductal dilatation or surrounding inflammatory changes. Spleen: Normal in size without significant abnormality. Adrenals/Urinary Tract: Adrenal glands are unremarkable. Kidneys are normal, without renal calculi, solid lesion, or hydronephrosis. Bladder is unremarkable. Stomach/Bowel: Gastric lap band and reservoir. Stomach is otherwise within normal limits. Status post appendectomy. No evidence of bowel wall thickening, distention, or inflammatory changes. Vascular/Lymphatic: No significant vascular findings are present. No enlarged abdominal or pelvic lymph nodes. Reproductive: No mass or other significant abnormality. Other: No abdominal wall hernia or abnormality. No ascites. Musculoskeletal: No acute or significant osseous findings. IMPRESSION: 1. Status post cholecystectomy. Large air and fluid collection in the gallbladder fossa measuring 8.2 x 5.7 cm, consistent with postoperative abscess and/or biliary leak. 2. Negative examination for pulmonary embolism. 3. Coronary artery disease. Aortic Atherosclerosis (ICD10-I70.0). Electronically Signed   By: Jearld Lesch M.D.   On: 06/19/2023 09:42   DG Chest 2 View  Result Date: 06/19/2023 CLINICAL DATA:  Shortness of breath. EXAM: CHEST - 2 VIEW COMPARISON:  Chest two views 06/06/2023, 05/09/2023, 02/21/2022 FINDINGS: Cardiac silhouette and mediastinal contours within normal limits. The lungs are clear. No pleural effusion pneumothorax. Minimal multilevel degenerative disc changes of the thoracic spine. IMPRESSION: No active cardiopulmonary disease. Electronically Signed   By: Neita Garnet M.D.   On: 06/19/2023 08:49       Assessment/Plan RUQ fluid collection s/p laparoscopic cholecystectomy 05/15/2023 by Dr. Janee Morn where gallbladder empyema was noted and surgicel snow was used in the gallbladder fossa for hemostasis.  - afebrile, WBC 11.7  - CT abdomen/pelvis with 8.2 x 5.7 cm air fluid collection.  - IV abx and IR consult for drainage of fluid collection. I have a low suspicion for a bile leak clinically - he is afebrile, no abdominal pain, normal LFT's. It would be atypical for a bile leak to occur 4 weeks post-operatively as well. I have a higher suspicion for post-operative abscess with a later presentation due to him being on oral antibiotics for the last couple of weeks.  - no emergent surgical needs    FEN - NPO for possible IR procedure, IVF per primary; if no procedures planned for today then ok for a regular diet from [facility] standpoint VTE - SCD's ID - Zosyn Admit - TRH service  Below per primary team --  Recent history of CAP s/p outpatient PO abx HTD HLD CAD  DM2 OSA Obesity     I reviewed nursing notes, ED provider notes, hospitalist notes, last 24 h vitals and pain scores, last 48 h intake and output, last 24 h labs and trends, and last 24 h imaging results.  Adam Phenix, PA-C [facility] 06/19/2023, 11:08 AM Please see Amion for pager number during day hours 7:00am-4:30pm or 7:00am -11:30am on weekends

## 2023-06-19 NOTE — ED Notes (Signed)
 Called [facility] for transport, pt bed assignment ready

## 2023-06-19 NOTE — ED Notes (Signed)
Report given to Alwyn Ren, via phone. ETA 10-15 minutes.

## 2023-06-19 NOTE — ED Notes (Signed)
Called reports to Carley Hammed, RN for 5 center bed 12.

## 2023-06-19 NOTE — Progress Notes (Signed)
This nurse followed hypoglycemia protocol. Patient noted to be asymptomatic but hypoglycemic. FSBS 60, 4 oz orange juice given. Rechecked. FSBS 59 4 oz orange juice given  with crackers and ice cream, dinner tray ordered. Patient had been NPO all day. Recheck noted to be 44 then 58. Patient given 31 g glucose gel. Will repeat 1910 and follow protocol accordingly. MD notified by charge nurse and MD states to continue hypoglycemic protocol.

## 2023-06-19 NOTE — Consult Note (Signed)
Chief Complaint: Patient was seen in consultation today for intra-abdominal abscess  Referring Physician(s): Dr. Corliss Skains  Supervising Physician: Gilmer Mor  Patient Status: [facility] - In-pt  History of Present Illness: [name] is a 62 y.o. male with past medical history of diastolic CHF, DM, HLD, HTN, OSA, s/p recent lap cholecystectomy admitted with abdominal pain, elevated WBC.    CT Abdomen shows: 1. Status post cholecystectomy. Large air and fluid collection in the gallbladder fossa measuring 8.2 x 5.7 cm, consistent with postoperative abscess and/or biliary leak. 2. Negative examination for pulmonary embolism. 3. Coronary artery disease.  IR consulted for gall bladder fossa aspiration and drainage.  Case reviewed and approved by Dr. Loreta Ave.   Patient assessed at bedside alongside his daughter.  He is understanding of the goals of the procedure and is agreeable to proceed.     Past Medical History:  Diagnosis Date   Allergic rhinitis, cause unspecified    Anxiety    Chronic diastolic CHF (congestive heart failure) (HCC)    a. 03/2017 Echo: EF 55%, Gr1DD, nl RV fxn.   Degeneration of intervertebral disc, site unspecified    Diabetes mellitus    Type II   Hyperlipidemia    Hypertension    Impotence of organic origin    Joint pain    Nonobstructive CAD    a. 03/2014 Cath: LM nl, LAD nl, D1 30ost/p, LCX nl, RCA 30d, EF 60%; b. 03/2017 MV: EF 56%, small, moderate basal inf/mid inf defect->prob inf thinning vs mild ischemia-->low-risk; c. 08/2021 Cath: LM nl, LAD 20ost/p, LCX large, 34m, RCA large, 30d, EF 50-55%. Nl filling pressures-->Med rx.   Obesity    Obesity, unspecified    Obstructive sleep apnea (adult) (pediatric)    Other chronic allergic conjunctivitis    Personal history of colonic polyps    SOB (shortness of breath)     Past Surgical History:  Procedure Laterality Date   admission  01/2009   [facility].  Syncopal event with nausea, diaphoresis,  hypertension.  D-dimer elevated at 2.54 with negative CT chest. and VQ scan.  Cardiac enzymes negative x 3.  Labs normal.  Cardiology consult---cardiolite abn; cath, 2D-echo, holter monitor unrevealing.  SE H&V.   Allergy Consult  07/11/2010   SOB, itching, facial tingling, hypotension.  Allergy testing: no food allergies; +reaction to tree pollen, grass pollen, dust mites, mold.  Avoid diclofenac and similar products.  [name].   APPENDECTOMY     arthroscopy of knee  1980   BACK SURGERY     x3   CATARACT EXTRACTION Left    CHOLECYSTECTOMY N/A 05/15/2023   Procedure: LAPAROSCOPIC CHOLECYSTECTOMY;  Surgeon: Violeta Gelinas, MD;  Location: [facility] OR;  Service: General;  Laterality: N/A;   COLONOSCOPY  07/11/2009   single polyp; repeat in 5 years.  [name].   GI consult  07/11/2010   CT chest in ED (gastric thickening).  [name].  No EGD warranted due to lack of symptoms.   LAPAROSCOPIC GASTRIC BANDING  09/15/09   LEFT HEART CATHETERIZATION WITH CORONARY ANGIOGRAM N/A 04/02/2014   Procedure: LEFT HEART CATHETERIZATION WITH CORONARY ANGIOGRAM;  Surgeon: Lesleigh Noe, MD;  Location: [facility] CATH LAB;  Service: Cardiovascular;  Laterality: N/A;   QUADRICEPS REPAIR     left   QUADRICEPS TENDON REPAIR     left   RETINAL DETACHMENT SURGERY     RIGHT/LEFT HEART CATH AND CORONARY ANGIOGRAPHY N/A 08/13/2021   Procedure: RIGHT/LEFT HEART CATH AND CORONARY ANGIOGRAPHY;  Surgeon:  Kathleene Hazel, MD;  Location: [facility] INVASIVE CV LAB;  Service: Cardiovascular;  Laterality: N/A;   ROTATOR CUFF REPAIR     right   Sleep study  07/11/2010   repeat sleep study due to weight loss.  Severe OSA; CPAP titration to 8 cm.     SPINE SURGERY     TONSILLECTOMY AND ADENOIDECTOMY  1960    Allergies: Diclofenac sodium, Statins, Voltaren [diclofenac sodium], Lipitor [atorvastatin], Metoprolol, and Ramipril  Medications: Prior to Admission medications   Medication Sig Start Date End Date Taking? Authorizing Provider  acetaminophen  (TYLENOL) 500 MG tablet Take 1,000 mg by mouth once as needed for moderate pain (pain score 4-6).   Yes [provider]  albuterol (PROVENTIL) (2.5 MG/3ML) 0.083% nebulizer solution Take 3 mLs (2.5 mg total) by nebulization every 6 (six) hours as needed for wheezing or shortness of breath. 06/13/23  Yes Eden Emms, NP  albuterol (VENTOLIN HFA) 108 (90 Base) MCG/ACT inhaler Inhale 1-2 puffs into the lungs every 6 (six) hours as needed for wheezing or shortness of breath. 10/11/22  Yes Oretha Milch, MD  aspirin EC 81 MG tablet Take 81 mg by mouth daily.   Yes [provider]  Continuous Glucose Sensor (DEXCOM G7 SENSOR) MISC See admin instructions.   Yes [provider]  cyclobenzaprine (FLEXERIL) 5 MG tablet TAKE ONE TABLET BY MOUTH EVERY NIGHT AT BEDTIME 03/10/23  Yes Joaquim Nam, MD  Dulaglutide (TRULICITY) 4.5 MG/0.5ML SOPN Inject 4.5 mg into the skin once a week. Saturday or Sunday- Last injection was 05/06/2023.   Yes [provider]  Evolocumab (REPATHA SURECLICK) 140 MG/ML SOAJ Inject 140 mg into the skin every 14 (fourteen) days. 05/10/23  Yes Perlie Gold, PA-C  ezetimibe (ZETIA) 10 MG tablet Take 1 tablet (10 mg total) by mouth daily. 02/14/22  Yes Kathleene Hazel, MD  FLUoxetine (PROZAC) 20 MG tablet TAKE ONE TABLET BY MOUTH ONCE A DAY 01/24/23  Yes Joaquim Nam, MD  gabapentin (NEURONTIN) 100 MG capsule TAKE ONE TO THREE CAPSULES BY MOUTH THREE TIMES DAILY AS NEEDED 06/07/23  Yes Joaquim Nam, MD  insulin aspart (NOVOLOG FLEXPEN) 100 UNIT/ML FlexPen Per sliding scale 02/26/21  Yes Joaquim Nam, MD  insulin degludec (TRESIBA) 200 UNIT/ML FlexTouch Pen Inject 32 Units into the skin every evening. 10/14/21  Yes Joaquim Nam, MD  JARDIANCE 25 MG TABS tablet TAKE 1 TABLET BY MOUTH ONCE A DAY 11/23/20  Yes Carlus Pavlov, MD  levofloxacin (LEVAQUIN) 500 MG tablet Take 1 tablet (500 mg total) by mouth daily for 7 days. 06/15/23  06/22/23 Yes Bedsole, Amy E, MD  metFORMIN (GLUCOPHAGE) 1000 MG tablet TAKE 1 TABLET (1,000 MG TOTAL) BY MOUTH 2 (TWO) TIMES DAILY WITH A MEAL. 10/28/19  Yes Carlus Pavlov, MD  Multiple Vitamin (MULTIVITAMIN) capsule Take 1 capsule by mouth daily.   Yes [provider]  nebivolol (BYSTOLIC) 5 MG tablet TAKE ONE TABLET (5 MG TOTAL) BY MOUTH DAILY. PLEASE KEEP SCHEDULED APPOINTMENT FOR FUTURE REFILLS. THANK YOU. 05/11/23  Yes Kathleene Hazel, MD  omeprazole (PRILOSEC) 20 MG capsule Take 20 mg by mouth daily. 05/09/23  Yes [provider]  ondansetron (ZOFRAN) 4 MG tablet Take 1 tablet (4 mg total) by mouth 4 (four) times daily as needed for nausea. 05/19/23  Yes Joaquim Nam, MD  Vitamin D, Ergocalciferol, (DRISDOL) 1.25 MG (50000 UNIT) CAPS capsule Take 1 capsule (50,000 Units total) by mouth every 7 (seven) days.  04/25/22  Yes Worthy Rancher, MD  nitroGLYCERIN (NITROSTAT) 0.4 MG SL tablet Place 1 tablet (0.4 mg total) under the tongue every 5 (five) minutes as needed for chest pain. 08/06/21 05/15/23  Creig Hines, NP     Family History  Problem Relation Age of Onset   Cancer Father        skin   Heart disease Father 56       AMI first age 74; stents.   Diabetes Father    Hypertension Father    Hyperlipidemia Father    Peripheral Artery Disease Father    Parkinson's disease Father    Cancer Paternal Grandfather        skin   Diabetes Mother    Arthritis Mother        total hip replacement   Kidney failure Mother    Depression Sister    Diabetes Sister    Cancer Maternal Uncle        lymphoma   Prostate cancer Maternal Uncle    Colon cancer Neg Hx     Social History   Socioeconomic History   Marital status: Married    Spouse name: Marchelle Folks   Number of children: 3   Years of education: Not on file   Highest education level: 12th grade  Occupational History   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, broadcasting/film/video    Comment: Supervisor x 23 yrs   Occupation: Retired  Tobacco Use   Smoking status: Former    Current packs/day: 0.25    Average packs/day: 0.3 packs/day for 2.0 years (0.5 ttl pk-yrs)    Types: Cigarettes   Smokeless tobacco: Never  Vaping Use   Vaping status: Never Used  Substance and Sexual Activity   Alcohol use: Yes    Alcohol/week: 0.0 standard drinks of alcohol    Comment: occasional Beer once every two weeks on the weekends   Drug use: No   Sexual activity: Yes  Other Topics Concern   Not on file  Social History Narrative   Married 1986       Children: 3 children, 3 grandchildren; 2 great grandkids      Lives: with wife, dog.      Employment: employed.      Tobacco: never      Alcohol: beer twice per month on weekends.      Drugs: none      Exercise: walking three days per week.      Seatbelt: 100% of time      Guns: loaded and secured/locked.      Sunscreen: yes.   Social Determinants of Health   Financial Resource Strain: Low Risk  (05/18/2023)   Overall Financial Resource Strain (CARDIA)    Difficulty of Paying Living Expenses: Not very hard  Food Insecurity: No Food Insecurity (06/19/2023)   Hunger Vital Sign    Worried About Running Out of Food in the Last Year: Never true    Ran Out of Food in the Last Year: Never true  Transportation Needs: No Transportation Needs (06/19/2023)   PRAPARE - Administrator, Civil Service (Medical): No    Lack of Transportation (Non-Medical): No  Physical Activity: Sufficiently Active (05/18/2023)   Exercise Vital Sign    Days of Exercise per Week: 7 days    Minutes of Exercise per Session: 30 min  Stress: Not on file  Social Connections: Socially Integrated (05/18/2023)   Social Connection and Isolation  Panel [NHANES]    Frequency of Communication with Friends and Family: Three times a week    Frequency of Social Gatherings with Friends and Family: Once a week    Attends Religious  Services: More than 4 times per year    Active Member of [employer] or Organizations: Yes    Attends Engineer, structural: More than 4 times per year    Marital Status: Married     Review of Systems: A 12 point ROS discussed and pertinent positives are indicated in the HPI above.  All other systems are negative.  Review of Systems  Constitutional:  Negative for fatigue and fever.  Respiratory:  Negative for cough and shortness of breath.   Cardiovascular:  Negative for chest pain.  Gastrointestinal:  Negative for abdominal pain, nausea and vomiting.  Musculoskeletal:  Negative for back pain.  Psychiatric/Behavioral:  Negative for behavioral problems and confusion.     Vital Signs: BP (!) 140/69 (BP Location: Left Arm)   Pulse 94   Temp 97.8 F (36.6 C) (Oral)   Resp 18   Ht 6\' 1"  (1.854 m)   Wt 282 lb 2 oz (128 kg)   SpO2 98%   BMI 37.22 kg/m   Physical Exam Vitals and nursing note reviewed.  Constitutional:      General: He is not in acute distress.    Appearance: Normal appearance. He is not ill-appearing.  HENT:     Mouth/Throat:     Mouth: Mucous membranes are moist.     Pharynx: Oropharynx is clear.  Cardiovascular:     Rate and Rhythm: Normal rate and regular rhythm.  Pulmonary:     Effort: Pulmonary effort is normal. No respiratory distress.     Breath sounds: Normal breath sounds.  Abdominal:     General: Abdomen is flat. There is no distension.     Palpations: Abdomen is soft.  Skin:    General: Skin is warm and dry.  Neurological:     General: No focal deficit present.     Mental Status: He is alert and oriented to person, place, and time. Mental status is at baseline.  Psychiatric:        Mood and Affect: Mood normal.        Behavior: Behavior normal.        Thought Content: Thought content normal.        Judgment: Judgment normal.      MD Evaluation Airway: WNL Heart: WNL Abdomen: WNL Chest/ Lungs: WNL ASA  Classification:  3 Mallampati/Airway Score: Two   Imaging: CT Angio Chest PE W and/or Wo Contrast  Result Date: 06/19/2023 CLINICAL DATA:  PE suspected, nausea, vomiting, abdominal pain, recent cholecystectomy EXAM: CT ANGIOGRAPHY CHEST CT ABDOMEN AND PELVIS WITH CONTRAST TECHNIQUE: Multidetector CT imaging of the chest was performed using the standard protocol during bolus administration of intravenous contrast. Multiplanar CT image reconstructions and MIPs were obtained to evaluate the vascular anatomy. Multidetector CT imaging of the abdomen and pelvis was performed using the standard protocol during bolus administration of intravenous contrast. RADIATION DOSE REDUCTION: This exam was performed according to the departmental dose-optimization program which includes automated exposure control, adjustment of the mA and/or kV according to patient size and/or use of iterative reconstruction technique. CONTRAST:  OMNIPAQUE IOHEXOL 350 MG/ML SOLN COMPARISON:  CT abdomen pelvis, 05/09/2023, CT chest angiogram, 05/30/2014 FINDINGS: CT CHEST ANGIOGRAM FINDINGS Cardiovascular: Satisfactory opacification of the pulmonary arteries to the segmental level. No evidence of pulmonary embolism.  Normal heart size. Left coronary artery calcifications. No pericardial effusion. Scattered aortic atherosclerosis. Mediastinum/Nodes: Enlarged bilateral mediastinal and hilar lymph nodes right hilar nodes measuring up to 2.5 x 1.5 cm (series 5, image 106). Thyroid gland, trachea, and esophagus demonstrate no significant findings. Lungs/Pleura: Diffuse bilateral bronchial wall thickening no pleural effusion or pneumothorax. Musculoskeletal: No chest wall abnormality. No acute osseous findings. Review of the MIP images confirms the above findings. CT ABDOMEN PELVIS FINDINGS Hepatobiliary: No focal liver abnormality is seen. Hepatic steatosis. Status post cholecystectomy. Large air and fluid collection in the gallbladder fossa measuring 8.2 x 5.7  cm (series 2, image 23). No biliary dilatation. Pancreas: Diffusely atrophic pancreas. No pancreatic ductal dilatation or surrounding inflammatory changes. Spleen: Normal in size without significant abnormality. Adrenals/Urinary Tract: Adrenal glands are unremarkable. Kidneys are normal, without renal calculi, solid lesion, or hydronephrosis. Bladder is unremarkable. Stomach/Bowel: Gastric lap band and reservoir. Stomach is otherwise within normal limits. Status post appendectomy. No evidence of bowel wall thickening, distention, or inflammatory changes. Vascular/Lymphatic: No significant vascular findings are present. No enlarged abdominal or pelvic lymph nodes. Reproductive: No mass or other significant abnormality. Other: No abdominal wall hernia or abnormality. No ascites. Musculoskeletal: No acute or significant osseous findings. IMPRESSION: 1. Status post cholecystectomy. Large air and fluid collection in the gallbladder fossa measuring 8.2 x 5.7 cm, consistent with postoperative abscess and/or biliary leak. 2. Negative examination for pulmonary embolism. 3. Coronary artery disease. Aortic Atherosclerosis (ICD10-I70.0). Electronically Signed   By: Jearld Lesch M.D.   On: 06/19/2023 09:42   CT ABDOMEN PELVIS W CONTRAST  Result Date: 06/19/2023 CLINICAL DATA:  PE suspected, nausea, vomiting, abdominal pain, recent cholecystectomy EXAM: CT ANGIOGRAPHY CHEST CT ABDOMEN AND PELVIS WITH CONTRAST TECHNIQUE: Multidetector CT imaging of the chest was performed using the standard protocol during bolus administration of intravenous contrast. Multiplanar CT image reconstructions and MIPs were obtained to evaluate the vascular anatomy. Multidetector CT imaging of the abdomen and pelvis was performed using the standard protocol during bolus administration of intravenous contrast. RADIATION DOSE REDUCTION: This exam was performed according to the departmental dose-optimization program which includes automated exposure  control, adjustment of the mA and/or kV according to patient size and/or use of iterative reconstruction technique. CONTRAST:  OMNIPAQUE IOHEXOL 350 MG/ML SOLN COMPARISON:  CT abdomen pelvis, 05/09/2023, CT chest angiogram, 05/30/2014 FINDINGS: CT CHEST ANGIOGRAM FINDINGS Cardiovascular: Satisfactory opacification of the pulmonary arteries to the segmental level. No evidence of pulmonary embolism. Normal heart size. Left coronary artery calcifications. No pericardial effusion. Scattered aortic atherosclerosis. Mediastinum/Nodes: Enlarged bilateral mediastinal and hilar lymph nodes right hilar nodes measuring up to 2.5 x 1.5 cm (series 5, image 106). Thyroid gland, trachea, and esophagus demonstrate no significant findings. Lungs/Pleura: Diffuse bilateral bronchial wall thickening no pleural effusion or pneumothorax. Musculoskeletal: No chest wall abnormality. No acute osseous findings. Review of the MIP images confirms the above findings. CT ABDOMEN PELVIS FINDINGS Hepatobiliary: No focal liver abnormality is seen. Hepatic steatosis. Status post cholecystectomy. Large air and fluid collection in the gallbladder fossa measuring 8.2 x 5.7 cm (series 2, image 23). No biliary dilatation. Pancreas: Diffusely atrophic pancreas. No pancreatic ductal dilatation or surrounding inflammatory changes. Spleen: Normal in size without significant abnormality. Adrenals/Urinary Tract: Adrenal glands are unremarkable. Kidneys are normal, without renal calculi, solid lesion, or hydronephrosis. Bladder is unremarkable. Stomach/Bowel: Gastric lap band and reservoir. Stomach is otherwise within normal limits. Status post appendectomy. No evidence of bowel wall thickening, distention, or inflammatory changes. Vascular/Lymphatic: No significant vascular findings  are present. No enlarged abdominal or pelvic lymph nodes. Reproductive: No mass or other significant abnormality. Other: No abdominal wall hernia or abnormality. No ascites.  Musculoskeletal: No acute or significant osseous findings. IMPRESSION: 1. Status post cholecystectomy. Large air and fluid collection in the gallbladder fossa measuring 8.2 x 5.7 cm, consistent with postoperative abscess and/or biliary leak. 2. Negative examination for pulmonary embolism. 3. Coronary artery disease. Aortic Atherosclerosis (ICD10-I70.0). Electronically Signed   By: Jearld Lesch M.D.   On: 06/19/2023 09:42   DG Chest 2 View  Result Date: 06/19/2023 CLINICAL DATA:  Shortness of breath. EXAM: CHEST - 2 VIEW COMPARISON:  Chest two views 06/06/2023, 05/09/2023, 02/21/2022 FINDINGS: Cardiac silhouette and mediastinal contours within normal limits. The lungs are clear. No pleural effusion pneumothorax. Minimal multilevel degenerative disc changes of the thoracic spine. IMPRESSION: No active cardiopulmonary disease. Electronically Signed   By: Neita Garnet M.D.   On: 06/19/2023 08:49   DG Chest 2 View  Result Date: 06/06/2023 CLINICAL DATA:  cough, doe, wheezing, recent surgery EXAM: CHEST - 2 VIEW COMPARISON:  CXR 05/09/23 FINDINGS: Pleural effusion. No pneumothorax. Normal cardiac and mediastinal contours. No radiographically apparent displaced rib fractures. Visualized upper abdomen unremarkable. There is a focal airspace opacity in the right infrahilar region, which could represent atelectasis or infection. Visualized upper abdomen unremarkable. Vertebral body heights are maintained. IMPRESSION: Focal airspace opacity in the right infrahilar region, which could represent atelectasis or infection. Electronically Signed   By: Lorenza Cambridge M.D.   On: 06/06/2023 09:27    Labs:  CBC: Recent Labs    05/17/23 0442 06/06/23 0917 06/15/23 1235 06/19/23 0803  WBC 9.9 11.0* 13.6* 11.7*  HGB 12.2* 12.7* 13.9 12.9*  HCT 37.4* 39.1 41.6 38.8*  PLT 399 618.0* 565.0* 468*    COAGS: Recent Labs    05/14/23 2250  INR 1.1  APTT 31    BMP: Recent Labs    05/15/23 0650 05/16/23 0532  05/17/23 0442 06/06/23 0917 06/15/23 1235 06/19/23 0803  NA 137 133* 133* 137  --  137  K 4.3 4.6 3.8 5.2 No hemolysis seen* 4.8 4.6  CL 103 102 100 98  --  99  CO2 24 14* 22 31  --  27  GLUCOSE 110* 181* 145* 79  --  84  BUN 13 22 20 16   --  20  CALCIUM 8.9 8.4* 8.3* 9.1  --  9.7  CREATININE 1.03 1.30* 1.19 0.84  --  1.05  GFRNONAA >60 >60 >60  --   --  >60    LIVER FUNCTION TESTS: Recent Labs    05/16/23 0532 05/17/23 0442 06/06/23 0917 06/19/23 0803  BILITOT 1.7* 1.2* 0.6 0.5  AST 45* 27 11 10*  ALT 34 29 8 7   ALKPHOS 92 84 113 108  PROT 6.6 6.3* 6.8 7.5  ALBUMIN 2.8* 2.5* 3.5 3.7    TUMOR MARKERS: No results for input(s): "AFPTM", "CEA", "CA199", "CHROMGRNA" in the last 8760 hours.  Assessment and Plan: Gall bladder fossa abscess Patient admitted with general malaise, nausea, vomiting after recent gall bladder removal.  CT imaging shows gall bladder fossa abscess.  IR consutled for aspriation and drainage.  Case reviewed by Dr. Loreta Ave who approves patient for the procedure.   PA to bedside. Patient NPO.  Does not take blood thinners.  Agreeable to procedure.  WBC 13.2, afebrile.   Risks and benefits discussed with the patient including bleeding, infection, damage to adjacent structures, bowel perforation/fistula connection, and sepsis.  All of the patient's questions were answered, patient is agreeable to proceed. Consent signed and in chart.   Thank you for this interesting consult.  I greatly enjoyed meeting DIMAGGIO WINEY and look forward to participating in their care.  A copy of this report was sent to the requesting provider on this date.  Electronically Signed: Hoyt Koch, PA 06/19/2023, 3:14 PM   I spent a total of 40 Minutes    in face to face in clinical consultation, greater than 50% of which was counseling/coordinating care for gall bladder fossa abscess.

## 2023-06-19 NOTE — ED Provider Notes (Signed)
[facility] GENERAL MED/SURG UNIT Provider Note   CSN: [id number] Arrival date & time: 06/19/23  0744     History  Chief Complaint  Patient presents with   Nausea   Emesis    [name] is a 62 y.o. male.  This is a for 62 year old male presenting emergency department with nausea and vomiting.  Reports developing symptoms last night.  Vomited several times.  No abdominal pain.  Reports recent surgery in November.  Also complaining of shortness of breath that is seemingly been worsening since the surgery.  He notes that he is currently on antibiotics for pneumonia.  This is his second round.   Emesis      Home Medications Prior to Admission medications   Medication Sig Start Date End Date Taking? Authorizing Provider  acetaminophen (TYLENOL) 500 MG tablet Take 1,000 mg by mouth once as needed for moderate pain (pain score 4-6).   Yes [provider]  albuterol (PROVENTIL) (2.5 MG/3ML) 0.083% nebulizer solution Take 3 mLs (2.5 mg total) by nebulization every 6 (six) hours as needed for wheezing or shortness of breath. 06/13/23  Yes Eden Emms, NP  albuterol (VENTOLIN HFA) 108 (90 Base) MCG/ACT inhaler Inhale 1-2 puffs into the lungs every 6 (six) hours as needed for wheezing or shortness of breath. 10/11/22  Yes Oretha Milch, MD  aspirin EC 81 MG tablet Take 81 mg by mouth daily.   Yes [provider]  Continuous Glucose Sensor (DEXCOM G7 SENSOR) MISC See admin instructions.   Yes [provider]  cyclobenzaprine (FLEXERIL) 5 MG tablet TAKE ONE TABLET BY MOUTH EVERY NIGHT AT BEDTIME 03/10/23  Yes Joaquim Nam, MD  Dulaglutide (TRULICITY) 4.5 MG/0.5ML SOPN Inject 4.5 mg into the skin once a week. Saturday or Sunday- Last injection was 05/06/2023.   Yes [provider]  Evolocumab (REPATHA SURECLICK) 140 MG/ML SOAJ Inject 140 mg into the skin every 14 (fourteen) days. 05/10/23  Yes Perlie Gold, PA-C  ezetimibe (ZETIA) 10 MG tablet  Take 1 tablet (10 mg total) by mouth daily. 02/14/22  Yes Kathleene Hazel, MD  FLUoxetine (PROZAC) 20 MG tablet TAKE ONE TABLET BY MOUTH ONCE A DAY 01/24/23  Yes Joaquim Nam, MD  gabapentin (NEURONTIN) 100 MG capsule TAKE ONE TO THREE CAPSULES BY MOUTH THREE TIMES DAILY AS NEEDED 06/07/23  Yes Joaquim Nam, MD  insulin aspart (NOVOLOG FLEXPEN) 100 UNIT/ML FlexPen Per sliding scale 02/26/21  Yes Joaquim Nam, MD  insulin degludec (TRESIBA) 200 UNIT/ML FlexTouch Pen Inject 32 Units into the skin every evening. 10/14/21  Yes Joaquim Nam, MD  JARDIANCE 25 MG TABS tablet TAKE 1 TABLET BY MOUTH ONCE A DAY 11/23/20  Yes Carlus Pavlov, MD  levofloxacin (LEVAQUIN) 500 MG tablet Take 1 tablet (500 mg total) by mouth daily for 7 days. 06/15/23 06/22/23 Yes Bedsole, Amy E, MD  metFORMIN (GLUCOPHAGE) 1000 MG tablet TAKE 1 TABLET (1,000 MG TOTAL) BY MOUTH 2 (TWO) TIMES DAILY WITH A MEAL. 10/28/19  Yes Carlus Pavlov, MD  Multiple Vitamin (MULTIVITAMIN) capsule Take 1 capsule by mouth daily.   Yes [provider]  nebivolol (BYSTOLIC) 5 MG tablet TAKE ONE TABLET (5 MG TOTAL) BY MOUTH DAILY. PLEASE KEEP SCHEDULED APPOINTMENT FOR FUTURE REFILLS. THANK YOU. 05/11/23  Yes Kathleene Hazel, MD  omeprazole (PRILOSEC) 20 MG capsule Take 20 mg by mouth daily. 05/09/23  Yes [provider]  ondansetron (ZOFRAN) 4 MG tablet Take 1 tablet (4  mg total) by mouth 4 (four) times daily as needed for nausea. 05/19/23  Yes Joaquim Nam, MD  Vitamin D, Ergocalciferol, (DRISDOL) 1.25 MG (50000 UNIT) CAPS capsule Take 1 capsule (50,000 Units total) by mouth every 7 (seven) days. 04/25/22  Yes Worthy Rancher, MD  nitroGLYCERIN (NITROSTAT) 0.4 MG SL tablet Place 1 tablet (0.4 mg total) under the tongue every 5 (five) minutes as needed for chest pain. 08/06/21 05/15/23  Creig Hines, NP      Allergies    Diclofenac sodium, Statins, Voltaren [diclofenac sodium], Lipitor  [atorvastatin], Metoprolol, and Ramipril    Review of Systems   Review of Systems  Gastrointestinal:  Positive for vomiting.    Physical Exam Updated Vital Signs BP (!) 140/69 (BP Location: Left Arm)   Pulse 94   Temp 97.8 F (36.6 C) (Oral)   Resp 18   Ht 6\' 1"  (1.854 m)   Wt 128 kg   SpO2 98%   BMI 37.22 kg/m  Physical Exam Vitals reviewed.  HENT:     Head: Normocephalic and atraumatic.     Nose: Nose normal.     Mouth/Throat:     Mouth: Mucous membranes are moist.  Eyes:     Conjunctiva/sclera: Conjunctivae normal.  Cardiovascular:     Rate and Rhythm: Normal rate and regular rhythm.  Pulmonary:     Effort: Pulmonary effort is normal.  Abdominal:     General: Abdomen is flat. There is no distension.     Palpations: Abdomen is soft.     Tenderness: There is no abdominal tenderness. There is no guarding or rebound.  Musculoskeletal:        General: Normal range of motion.  Skin:    General: Skin is warm and dry.     Capillary Refill: Capillary refill takes less than 2 seconds.  Neurological:     Mental Status: He is alert and oriented to person, place, and time.  Psychiatric:        Mood and Affect: Mood normal.        Behavior: Behavior normal.     ED Results / Procedures / Treatments   Labs (all labs ordered are listed, but only abnormal results are displayed) Labs Reviewed  CBC - Abnormal; Notable for the following components:      Result Value   WBC 11.7 (*)    Hemoglobin 12.9 (*)    HCT 38.8 (*)    Platelets 468 (*)    All other components within normal limits  COMPREHENSIVE METABOLIC PANEL - Abnormal; Notable for the following components:   AST 10 (*)    All other components within normal limits  D-DIMER, QUANTITATIVE - Abnormal; Notable for the following components:   D-Dimer, Quant 1.04 (*)    All other components within normal limits  RESP PANEL BY RT-PCR (RSV, FLU A&B, COVID)  [id number]  LIPASE, BLOOD  BRAIN NATRIURETIC PEPTIDE  LACTIC  ACID, PLASMA  LACTIC ACID, PLASMA  TROPONIN I (HIGH SENSITIVITY)    EKG None  Radiology CT Angio Chest PE W and/or Wo Contrast  Result Date: 06/19/2023 CLINICAL DATA:  PE suspected, nausea, vomiting, abdominal pain, recent cholecystectomy EXAM: CT ANGIOGRAPHY CHEST CT ABDOMEN AND PELVIS WITH CONTRAST TECHNIQUE: Multidetector CT imaging of the chest was performed using the standard protocol during bolus administration of intravenous contrast. Multiplanar CT image reconstructions and MIPs were obtained to evaluate the vascular anatomy. Multidetector CT imaging of the abdomen and pelvis was performed using the standard protocol  during bolus administration of intravenous contrast. RADIATION DOSE REDUCTION: This exam was performed according to the departmental dose-optimization program which includes automated exposure control, adjustment of the mA and/or kV according to patient size and/or use of iterative reconstruction technique. CONTRAST:  OMNIPAQUE IOHEXOL 350 MG/ML SOLN COMPARISON:  CT abdomen pelvis, 05/09/2023, CT chest angiogram, 05/30/2014 FINDINGS: CT CHEST ANGIOGRAM FINDINGS Cardiovascular: Satisfactory opacification of the pulmonary arteries to the segmental level. No evidence of pulmonary embolism. Normal heart size. Left coronary artery calcifications. No pericardial effusion. Scattered aortic atherosclerosis. Mediastinum/Nodes: Enlarged bilateral mediastinal and hilar lymph nodes right hilar nodes measuring up to 2.5 x 1.5 cm (series 5, image 106). Thyroid gland, trachea, and esophagus demonstrate no significant findings. Lungs/Pleura: Diffuse bilateral bronchial wall thickening no pleural effusion or pneumothorax. Musculoskeletal: No chest wall abnormality. No acute osseous findings. Review of the MIP images confirms the above findings. CT ABDOMEN PELVIS FINDINGS Hepatobiliary: No focal liver abnormality is seen. Hepatic steatosis. Status post cholecystectomy. Large air and fluid  collection in the gallbladder fossa measuring 8.2 x 5.7 cm (series 2, image 23). No biliary dilatation. Pancreas: Diffusely atrophic pancreas. No pancreatic ductal dilatation or surrounding inflammatory changes. Spleen: Normal in size without significant abnormality. Adrenals/Urinary Tract: Adrenal glands are unremarkable. Kidneys are normal, without renal calculi, solid lesion, or hydronephrosis. Bladder is unremarkable. Stomach/Bowel: Gastric lap band and reservoir. Stomach is otherwise within normal limits. Status post appendectomy. No evidence of bowel wall thickening, distention, or inflammatory changes. Vascular/Lymphatic: No significant vascular findings are present. No enlarged abdominal or pelvic lymph nodes. Reproductive: No mass or other significant abnormality. Other: No abdominal wall hernia or abnormality. No ascites. Musculoskeletal: No acute or significant osseous findings. IMPRESSION: 1. Status post cholecystectomy. Large air and fluid collection in the gallbladder fossa measuring 8.2 x 5.7 cm, consistent with postoperative abscess and/or biliary leak. 2. Negative examination for pulmonary embolism. 3. Coronary artery disease. Aortic Atherosclerosis (ICD10-I70.0). Electronically Signed   By: Jearld Lesch M.D.   On: 06/19/2023 09:42   CT ABDOMEN PELVIS W CONTRAST  Result Date: 06/19/2023 CLINICAL DATA:  PE suspected, nausea, vomiting, abdominal pain, recent cholecystectomy EXAM: CT ANGIOGRAPHY CHEST CT ABDOMEN AND PELVIS WITH CONTRAST TECHNIQUE: Multidetector CT imaging of the chest was performed using the standard protocol during bolus administration of intravenous contrast. Multiplanar CT image reconstructions and MIPs were obtained to evaluate the vascular anatomy. Multidetector CT imaging of the abdomen and pelvis was performed using the standard protocol during bolus administration of intravenous contrast. RADIATION DOSE REDUCTION: This exam was performed according to the departmental  dose-optimization program which includes automated exposure control, adjustment of the mA and/or kV according to patient size and/or use of iterative reconstruction technique. CONTRAST:  OMNIPAQUE IOHEXOL 350 MG/ML SOLN COMPARISON:  CT abdomen pelvis, 05/09/2023, CT chest angiogram, 05/30/2014 FINDINGS: CT CHEST ANGIOGRAM FINDINGS Cardiovascular: Satisfactory opacification of the pulmonary arteries to the segmental level. No evidence of pulmonary embolism. Normal heart size. Left coronary artery calcifications. No pericardial effusion. Scattered aortic atherosclerosis. Mediastinum/Nodes: Enlarged bilateral mediastinal and hilar lymph nodes right hilar nodes measuring up to 2.5 x 1.5 cm (series 5, image 106). Thyroid gland, trachea, and esophagus demonstrate no significant findings. Lungs/Pleura: Diffuse bilateral bronchial wall thickening no pleural effusion or pneumothorax. Musculoskeletal: No chest wall abnormality. No acute osseous findings. Review of the MIP images confirms the above findings. CT ABDOMEN PELVIS FINDINGS Hepatobiliary: No focal liver abnormality is seen. Hepatic steatosis. Status post cholecystectomy. Large air and fluid collection in the gallbladder fossa measuring  8.2 x 5.7 cm (series 2, image 23). No biliary dilatation. Pancreas: Diffusely atrophic pancreas. No pancreatic ductal dilatation or surrounding inflammatory changes. Spleen: Normal in size without significant abnormality. Adrenals/Urinary Tract: Adrenal glands are unremarkable. Kidneys are normal, without renal calculi, solid lesion, or hydronephrosis. Bladder is unremarkable. Stomach/Bowel: Gastric lap band and reservoir. Stomach is otherwise within normal limits. Status post appendectomy. No evidence of bowel wall thickening, distention, or inflammatory changes. Vascular/Lymphatic: No significant vascular findings are present. No enlarged abdominal or pelvic lymph nodes. Reproductive: No mass or other significant abnormality.  Other: No abdominal wall hernia or abnormality. No ascites. Musculoskeletal: No acute or significant osseous findings. IMPRESSION: 1. Status post cholecystectomy. Large air and fluid collection in the gallbladder fossa measuring 8.2 x 5.7 cm, consistent with postoperative abscess and/or biliary leak. 2. Negative examination for pulmonary embolism. 3. Coronary artery disease. Aortic Atherosclerosis (ICD10-I70.0). Electronically Signed   By: Jearld Lesch M.D.   On: 06/19/2023 09:42   DG Chest 2 View  Result Date: 06/19/2023 CLINICAL DATA:  Shortness of breath. EXAM: CHEST - 2 VIEW COMPARISON:  Chest two views 06/06/2023, 05/09/2023, 02/21/2022 FINDINGS: Cardiac silhouette and mediastinal contours within normal limits. The lungs are clear. No pleural effusion pneumothorax. Minimal multilevel degenerative disc changes of the thoracic spine. IMPRESSION: No active cardiopulmonary disease. Electronically Signed   By: Neita Garnet M.D.   On: 06/19/2023 08:49    Procedures Procedures    Medications Ordered in ED Medications  piperacillin-tazobactam (ZOSYN) IVPB 3.375 g (has no administration in time range)  FLUoxetine (PROZAC) tablet 20 mg (has no administration in time range)  nebivolol (BYSTOLIC) tablet 5 mg (has no administration in time range)  ezetimibe (ZETIA) tablet 10 mg (has no administration in time range)  aspirin EC tablet 81 mg (has no administration in time range)  pantoprazole (PROTONIX) EC tablet 40 mg (has no administration in time range)  gabapentin (NEURONTIN) capsule 100 mg (has no administration in time range)  albuterol (PROVENTIL) (2.5 MG/3ML) 0.083% nebulizer solution 3 mL (has no administration in time range)  sodium chloride flush (NS) 0.9 % injection 3 mL (3 mLs Intravenous Not Given 06/19/23 1530)  acetaminophen (TYLENOL) tablet 650 mg (has no administration in time range)    Or  acetaminophen (TYLENOL) suppository 650 mg (has no administration in time range)  magnesium  hydroxide (MILK OF MAGNESIA) suspension 30 mL (has no administration in time range)  insulin aspart (novoLOG) injection 0-15 Units (has no administration in time range)  insulin glargine-yfgn (SEMGLEE) injection 20 Units (has no administration in time range)  iohexol (OMNIPAQUE) 350 MG/ML injection 100 mL (100 mLs Intravenous Contrast Given 06/19/23 0921)  piperacillin-tazobactam (ZOSYN) IVPB 3.375 g (3.375 g Intravenous New Bag/Given 06/19/23 1034)    ED Course/ Medical Decision Making/ A&P Clinical Course as of 06/19/23 1555  Mon Jun 19, 2023  0917 DG Chest 2 View IMPRESSION: No active cardiopulmonary disease.   [TY]  1610 CT Angio Chest PE W and/or Wo Contrast IMPRESSION: 1. Status post cholecystectomy. Large air and fluid collection in the gallbladder fossa measuring 8.2 x 5.7 cm, consistent with postoperative abscess and/or biliary leak. 2. Negative examination for pulmonary embolism. 3. Coronary artery disease.  Aortic Atherosclerosis (ICD10-I70.0).   Electronically Signed   [TY]  [id number] Per chart review: "PRE-OPERATIVE DIAGNOSIS:  Cholecystitis   POST-OPERATIVE DIAGNOSIS:  Cholecystitis, empyema of the gallbladder   PROCEDURE:  Procedure(s): LAPAROSCOPIC CHOLECYSTECTOMY   SURGEON:  Surgeon(s): Violeta Gelinas, MD   " [TY]  1003 PA  Lanora Manis simaan working with Dr Jerolyn Center on general surgery recommending medical admission with surgery consult. IR drain placement + scan for biliary leak.   [TY]    Clinical Course User Index [TY] Coral Spikes, DO                                 Medical Decision Making 62 year old male present emergency department for nausea vomiting and shortness of breath.  He is afebrile nontachycardic hemodynamically stable.  Given clear lungs and chest x-ray concern for possible DVT given recent surgery and hospitalization.  D-dimer was elevated.  Obtain CT scan, CTA negative.  Also given recent surgery with his nausea vomiting obtained CT scan  showed large abscess.  Lab workup otherwise reassuring no metabolic derangements.  No transaminitis to suggest hepatobiliary disease.  Lipase normal.  Pancreatitis unlikely.  Lactate normal.  Mesenteric ischemia and systemic infection less likely.  Given his complaint of nausea vomiting concern for possible viral etiology.  Flu/COVID was negative.  Troponin 8.  EKG without ST segment changes to indicate ischemia.  Unclear cause for patient's shortness of breath.  Possible residual bronchitis/pneumonia?  He is taking Levaquin currently.  Case discussed with surgery.  Recommending admission for IR drainage and starting antibiotics.  Patient started on Zosyn.  Case discussed with hospitalist who agrees to admit patient.  Amount and/or Complexity of Data Reviewed Independent Historian:     Details: Daughter notes that patient can walk roughly half the distance as he did 3 weeks ago External Data Reviewed:     Details: See ED course Labs: ordered. Radiology: ordered. Decision-making details documented in ED Course. ECG/medicine tests: ordered.  Risk Prescription drug management. Decision regarding hospitalization.         Final Clinical Impression(s) / ED Diagnoses Final diagnoses:  Intra-abdominal abscess [facility])    Rx / DC Orders ED Discharge Orders     None         Coral Spikes, DO 06/19/23 1558

## 2023-06-19 NOTE — Progress Notes (Signed)
62 year old make with cholecystectomy about one month ago presents with nausea. CT find evidence of fluid in gallbladder fossa abscess versus biliary leak.   ER provider spoke with surgery who requests hospitalist admission to [facility] and IR drain.   Started on Zosyn and IR consulted.

## 2023-06-19 NOTE — H&P (Signed)
History and Physical   [name] ZOX:[id number] DOB: 1960/08/09 DOA: 06/19/2023  PCP: Joaquim Nam, MD   Patient coming from: Home  Chief Complaint: Nausea vomiting, malaise  HPI: [name] is a 62 y.o. male with medical history significant of hypertension, hyperlipidemia, diabetes, gastroparesis, CAD, diastolic CHF, history of lap band, obesity, OSA, recent admission for cholecystitis presenting with nausea vomiting and malaise.  As above patient was recently admitted for cholecystitis.  He was admitted for 11/3 until 11/6.  Present with abdominal pain found to have cholecystitis and underwent successful laparoscopic cholecystectomy on 11/4.  Postop notes indicate that empyema of the gallbladder was also in noted.  Patient had good recovery and was discharged home.    Was doing well at home for a while but had some shortness of breath.  Chest x-ray obtained outpatient and showed possible atelectasis versus infection on 11/24.  Was prescribed a course of Augmentin and azithromycin and translate felt better but then symptoms returned and he was seen again on 12/5 and prescribed Levaquin which he remains on.  He denies fevers, chills, chest pain, shortness of breath, abdominal pain, constipation, diarrhea.  ED Course: Vital signs in the ED notable for blood pressure in the 120s to 140s systolic.  Lab workup included CMP within normal limits.  CBC with leukocytosis to 11.7, hemoglobin stable 12.9, platelets 468.  Lactic acid normal with repeat pending.  Troponin negative.  BNP normal.  D-dimer mildly elevated at 1.04.  Lipase normal.  Respiratory panel for flu COVID and RSV negative.  Chest x-ray showed no acute abnormality.  CTA PE study with abdomen pelvis showed no evidence of PE, showed patient is being status post cholecystectomy.  Noted a fluid and air collection measuring 8.2 x 5.7 cm consistent with postoperative abscess versus biliary leak.  General surgery was consulted and are  following recommending IR drain placement.  Patient received Zosyn in the ED and IR also consulted.  Review of Systems: As per HPI otherwise all other systems reviewed and are negative.  Past Medical History:  Diagnosis Date   Allergic rhinitis, cause unspecified    Anxiety    Chronic diastolic CHF (congestive heart failure) (HCC)    a. 03/2017 Echo: EF 55%, Gr1DD, nl RV fxn.   Degeneration of intervertebral disc, site unspecified    Diabetes mellitus    Type II   Hyperlipidemia    Hypertension    Impotence of organic origin    Joint pain    Nonobstructive CAD    a. 03/2014 Cath: LM nl, LAD nl, D1 30ost/p, LCX nl, RCA 30d, EF 60%; b. 03/2017 MV: EF 56%, small, moderate basal inf/mid inf defect->prob inf thinning vs mild ischemia-->low-risk; c. 08/2021 Cath: LM nl, LAD 20ost/p, LCX large, 52m, RCA large, 30d, EF 50-55%. Nl filling pressures-->Med rx.   Obesity    Obesity, unspecified    Obstructive sleep apnea (adult) (pediatric)    Other chronic allergic conjunctivitis    Personal history of colonic polyps    SOB (shortness of breath)     Past Surgical History:  Procedure Laterality Date   admission  01/2009   [facility].  Syncopal event with nausea, diaphoresis, hypertension.  D-dimer elevated at 2.54 with negative CT chest. and VQ scan.  Cardiac enzymes negative x 3.  Labs normal.  Cardiology consult---cardiolite abn; cath, 2D-echo, holter monitor unrevealing.  SE H&V.   Allergy Consult  07/11/2010   SOB, itching, facial tingling, hypotension.  Allergy testing: no  food allergies; +reaction to tree pollen, grass pollen, dust mites, mold.  Avoid diclofenac and similar products.  [name].   APPENDECTOMY     arthroscopy of knee  1980   BACK SURGERY     x3   CATARACT EXTRACTION Left    CHOLECYSTECTOMY N/A 05/15/2023   Procedure: LAPAROSCOPIC CHOLECYSTECTOMY;  Surgeon: Violeta Gelinas, MD;  Location: [facility] OR;  Service: General;  Laterality: N/A;   COLONOSCOPY  07/11/2009   single polyp;  repeat in 5 years.  [name].   GI consult  07/11/2010   CT chest in ED (gastric thickening).  [name].  No EGD warranted due to lack of symptoms.   LAPAROSCOPIC GASTRIC BANDING  09/15/09   LEFT HEART CATHETERIZATION WITH CORONARY ANGIOGRAM N/A 04/02/2014   Procedure: LEFT HEART CATHETERIZATION WITH CORONARY ANGIOGRAM;  Surgeon: Lesleigh Noe, MD;  Location: [facility] CATH LAB;  Service: Cardiovascular;  Laterality: N/A;   QUADRICEPS REPAIR     left   QUADRICEPS TENDON REPAIR     left   RETINAL DETACHMENT SURGERY     RIGHT/LEFT HEART CATH AND CORONARY ANGIOGRAPHY N/A 08/13/2021   Procedure: RIGHT/LEFT HEART CATH AND CORONARY ANGIOGRAPHY;  Surgeon: Kathleene Hazel, MD;  Location: [facility] INVASIVE CV LAB;  Service: Cardiovascular;  Laterality: N/A;   ROTATOR CUFF REPAIR     right   Sleep study  07/11/2010   repeat sleep study due to weight loss.  Severe OSA; CPAP titration to 8 cm.     SPINE SURGERY     TONSILLECTOMY AND ADENOIDECTOMY  1960    Social History  reports that he has quit smoking. His smoking use included cigarettes. He has a 0.5 pack-year smoking history. He has never used smokeless tobacco. He reports current alcohol use. He reports that he does not use drugs.  Allergies  Allergen Reactions   Diclofenac Sodium Shortness Of Breath, Itching and Other (See Comments)    Hypotension    Statins Shortness Of Breath   Voltaren [Diclofenac Sodium] Anaphylaxis   Lipitor [Atorvastatin]     Myalgias at 80mg  dose   Metoprolol     Lightheaded/dizzy/SOB.     Ramipril     Lightheaded/dizzy/SOB.      Family History  Problem Relation Age of Onset   Cancer Father        skin   Heart disease Father 54       AMI first age 38; stents.   Diabetes Father    Hypertension Father    Hyperlipidemia Father    Peripheral Artery Disease Father    Parkinson's disease Father    Cancer Paternal Grandfather        skin   Diabetes Mother    Arthritis Mother        total hip replacement   Kidney  failure Mother    Depression Sister    Diabetes Sister    Cancer Maternal Uncle        lymphoma   Prostate cancer Maternal Uncle    Colon cancer Neg Hx   Reviewed on admission  Prior to Admission medications   Medication Sig Start Date End Date Taking? Authorizing Provider  acetaminophen (TYLENOL) 500 MG tablet Take 1,000 mg by mouth once as needed for moderate pain (pain score 4-6).   Yes [provider]  albuterol (PROVENTIL) (2.5 MG/3ML) 0.083% nebulizer solution Take 3 mLs (2.5 mg total) by nebulization every 6 (six) hours as needed for wheezing or shortness of breath. 06/13/23  Yes Eden Emms,  NP  albuterol (VENTOLIN HFA) 108 (90 Base) MCG/ACT inhaler Inhale 1-2 puffs into the lungs every 6 (six) hours as needed for wheezing or shortness of breath. 10/11/22  Yes Oretha Milch, MD  aspirin EC 81 MG tablet Take 81 mg by mouth daily.   Yes [provider]  Continuous Glucose Sensor (DEXCOM G7 SENSOR) MISC See admin instructions.   Yes [provider]  cyclobenzaprine (FLEXERIL) 5 MG tablet TAKE ONE TABLET BY MOUTH EVERY NIGHT AT BEDTIME 03/10/23  Yes Joaquim Nam, MD  Dulaglutide (TRULICITY) 4.5 MG/0.5ML SOPN Inject 4.5 mg into the skin once a week. Saturday or Sunday- Last injection was 05/06/2023.   Yes [provider]  Evolocumab (REPATHA SURECLICK) 140 MG/ML SOAJ Inject 140 mg into the skin every 14 (fourteen) days. 05/10/23  Yes Perlie Gold, PA-C  ezetimibe (ZETIA) 10 MG tablet Take 1 tablet (10 mg total) by mouth daily. 02/14/22  Yes Kathleene Hazel, MD  FLUoxetine (PROZAC) 20 MG tablet TAKE ONE TABLET BY MOUTH ONCE A DAY 01/24/23  Yes Joaquim Nam, MD  gabapentin (NEURONTIN) 100 MG capsule TAKE ONE TO THREE CAPSULES BY MOUTH THREE TIMES DAILY AS NEEDED 06/07/23  Yes Joaquim Nam, MD  insulin aspart (NOVOLOG FLEXPEN) 100 UNIT/ML FlexPen Per sliding scale 02/26/21  Yes Joaquim Nam, MD  insulin degludec (TRESIBA) 200 UNIT/ML  FlexTouch Pen Inject 32 Units into the skin every evening. 10/14/21  Yes Joaquim Nam, MD  JARDIANCE 25 MG TABS tablet TAKE 1 TABLET BY MOUTH ONCE A DAY 11/23/20  Yes Carlus Pavlov, MD  levofloxacin (LEVAQUIN) 500 MG tablet Take 1 tablet (500 mg total) by mouth daily for 7 days. 06/15/23 06/22/23 Yes Bedsole, Amy E, MD  metFORMIN (GLUCOPHAGE) 1000 MG tablet TAKE 1 TABLET (1,000 MG TOTAL) BY MOUTH 2 (TWO) TIMES DAILY WITH A MEAL. 10/28/19  Yes Carlus Pavlov, MD  Multiple Vitamin (MULTIVITAMIN) capsule Take 1 capsule by mouth daily.   Yes [provider]  nebivolol (BYSTOLIC) 5 MG tablet TAKE ONE TABLET (5 MG TOTAL) BY MOUTH DAILY. PLEASE KEEP SCHEDULED APPOINTMENT FOR FUTURE REFILLS. THANK YOU. 05/11/23  Yes Kathleene Hazel, MD  omeprazole (PRILOSEC) 20 MG capsule Take 20 mg by mouth daily. 05/09/23  Yes [provider]  ondansetron (ZOFRAN) 4 MG tablet Take 1 tablet (4 mg total) by mouth 4 (four) times daily as needed for nausea. 05/19/23  Yes Joaquim Nam, MD  Vitamin D, Ergocalciferol, (DRISDOL) 1.25 MG (50000 UNIT) CAPS capsule Take 1 capsule (50,000 Units total) by mouth every 7 (seven) days. 04/25/22  Yes Worthy Rancher, MD  nitroGLYCERIN (NITROSTAT) 0.4 MG SL tablet Place 1 tablet (0.4 mg total) under the tongue every 5 (five) minutes as needed for chest pain. 08/06/21 05/15/23  Creig Hines, NP    Physical Exam: Vitals:   06/19/23 0800 06/19/23 1030 06/19/23 1224 06/19/23 1344  BP: 123/65 120/71 (!) 140/69   Pulse: 100 98 94   Resp: 14 17 18    Temp:   97.8 F (36.6 C)   TempSrc:   Oral   SpO2: 100% 97% 98%   Weight:    128 kg  Height:    6\' 1"  (1.854 m)    Physical Exam Constitutional:      General: He is not in acute distress.    Appearance: Normal appearance. He is obese.  HENT:     Head: Normocephalic and atraumatic.     Mouth/Throat:     Mouth:  Mucous membranes are moist.     Pharynx: Oropharynx is clear.  Eyes:      Extraocular Movements: Extraocular movements intact.     Pupils: Pupils are equal, round, and reactive to light.  Cardiovascular:     Rate and Rhythm: Normal rate and regular rhythm.     Pulses: Normal pulses.     Heart sounds: Normal heart sounds.  Pulmonary:     Effort: Pulmonary effort is normal. No respiratory distress.     Breath sounds: Normal breath sounds.  Abdominal:     General: Bowel sounds are normal. There is no distension.     Palpations: Abdomen is soft.     Tenderness: There is no abdominal tenderness.  Musculoskeletal:        General: No swelling or deformity.  Skin:    General: Skin is warm and dry.  Neurological:     General: No focal deficit present.     Mental Status: Mental status is at baseline.    Labs on Admission: I have personally reviewed following labs and imaging studies  CBC: Recent Labs  Lab 06/15/23 1235 06/19/23 0803  WBC 13.6* 11.7*  NEUTROABS 8.2*  --   HGB 13.9 12.9*  HCT 41.6 38.8*  MCV 89.2 86.8  PLT 565.0* 468*    Basic Metabolic Panel: Recent Labs  Lab 06/15/23 1235 06/19/23 0803  NA  --  137  K 4.8 4.6  CL  --  99  CO2  --  27  GLUCOSE  --  84  BUN  --  20  CREATININE  --  1.05  CALCIUM  --  9.7    GFR: Estimated Creatinine Clearance: 99.6 mL/min (by C-G formula based on SCr of 1.05 mg/dL).  Liver Function Tests: Recent Labs  Lab 06/19/23 0803  AST 10*  ALT 7  ALKPHOS 108  BILITOT 0.5  PROT 7.5  ALBUMIN 3.7    Urine analysis:    Component Value Date/Time   COLORURINE YELLOW 05/14/2023 1801   APPEARANCEUR CLEAR 05/14/2023 1801   LABSPEC 1.029 05/14/2023 1801   PHURINE 6.0 05/14/2023 1801   GLUCOSEU >1,000 (A) 05/14/2023 1801   HGBUR TRACE (A) 05/14/2023 1801   BILIRUBINUR NEGATIVE 05/14/2023 1801   BILIRUBINUR neg 05/28/2014 1456   KETONESUR 40 (A) 05/14/2023 1801   PROTEINUR NEGATIVE 05/14/2023 1801   UROBILINOGEN 0.2 05/28/2014 1456   UROBILINOGEN 1.0 01/10/2009 0001   NITRITE NEGATIVE  05/14/2023 1801   LEUKOCYTESUR NEGATIVE 05/14/2023 1801    Radiological Exams on Admission: CT Angio Chest PE W and/or Wo Contrast  Result Date: 06/19/2023 CLINICAL DATA:  PE suspected, nausea, vomiting, abdominal pain, recent cholecystectomy EXAM: CT ANGIOGRAPHY CHEST CT ABDOMEN AND PELVIS WITH CONTRAST TECHNIQUE: Multidetector CT imaging of the chest was performed using the standard protocol during bolus administration of intravenous contrast. Multiplanar CT image reconstructions and MIPs were obtained to evaluate the vascular anatomy. Multidetector CT imaging of the abdomen and pelvis was performed using the standard protocol during bolus administration of intravenous contrast. RADIATION DOSE REDUCTION: This exam was performed according to the departmental dose-optimization program which includes automated exposure control, adjustment of the mA and/or kV according to patient size and/or use of iterative reconstruction technique. CONTRAST:  OMNIPAQUE IOHEXOL 350 MG/ML SOLN COMPARISON:  CT abdomen pelvis, 05/09/2023, CT chest angiogram, 05/30/2014 FINDINGS: CT CHEST ANGIOGRAM FINDINGS Cardiovascular: Satisfactory opacification of the pulmonary arteries to the segmental level. No evidence of pulmonary embolism. Normal heart size. Left coronary artery calcifications. No pericardial  effusion. Scattered aortic atherosclerosis. Mediastinum/Nodes: Enlarged bilateral mediastinal and hilar lymph nodes right hilar nodes measuring up to 2.5 x 1.5 cm (series 5, image 106). Thyroid gland, trachea, and esophagus demonstrate no significant findings. Lungs/Pleura: Diffuse bilateral bronchial wall thickening no pleural effusion or pneumothorax. Musculoskeletal: No chest wall abnormality. No acute osseous findings. Review of the MIP images confirms the above findings. CT ABDOMEN PELVIS FINDINGS Hepatobiliary: No focal liver abnormality is seen. Hepatic steatosis. Status post cholecystectomy. Large air and fluid  collection in the gallbladder fossa measuring 8.2 x 5.7 cm (series 2, image 23). No biliary dilatation. Pancreas: Diffusely atrophic pancreas. No pancreatic ductal dilatation or surrounding inflammatory changes. Spleen: Normal in size without significant abnormality. Adrenals/Urinary Tract: Adrenal glands are unremarkable. Kidneys are normal, without renal calculi, solid lesion, or hydronephrosis. Bladder is unremarkable. Stomach/Bowel: Gastric lap band and reservoir. Stomach is otherwise within normal limits. Status post appendectomy. No evidence of bowel wall thickening, distention, or inflammatory changes. Vascular/Lymphatic: No significant vascular findings are present. No enlarged abdominal or pelvic lymph nodes. Reproductive: No mass or other significant abnormality. Other: No abdominal wall hernia or abnormality. No ascites. Musculoskeletal: No acute or significant osseous findings. IMPRESSION: 1. Status post cholecystectomy. Large air and fluid collection in the gallbladder fossa measuring 8.2 x 5.7 cm, consistent with postoperative abscess and/or biliary leak. 2. Negative examination for pulmonary embolism. 3. Coronary artery disease. Aortic Atherosclerosis (ICD10-I70.0). Electronically Signed   By: Jearld Lesch M.D.   On: 06/19/2023 09:42   CT ABDOMEN PELVIS W CONTRAST  Result Date: 06/19/2023 CLINICAL DATA:  PE suspected, nausea, vomiting, abdominal pain, recent cholecystectomy EXAM: CT ANGIOGRAPHY CHEST CT ABDOMEN AND PELVIS WITH CONTRAST TECHNIQUE: Multidetector CT imaging of the chest was performed using the standard protocol during bolus administration of intravenous contrast. Multiplanar CT image reconstructions and MIPs were obtained to evaluate the vascular anatomy. Multidetector CT imaging of the abdomen and pelvis was performed using the standard protocol during bolus administration of intravenous contrast. RADIATION DOSE REDUCTION: This exam was performed according to the departmental  dose-optimization program which includes automated exposure control, adjustment of the mA and/or kV according to patient size and/or use of iterative reconstruction technique. CONTRAST:  OMNIPAQUE IOHEXOL 350 MG/ML SOLN COMPARISON:  CT abdomen pelvis, 05/09/2023, CT chest angiogram, 05/30/2014 FINDINGS: CT CHEST ANGIOGRAM FINDINGS Cardiovascular: Satisfactory opacification of the pulmonary arteries to the segmental level. No evidence of pulmonary embolism. Normal heart size. Left coronary artery calcifications. No pericardial effusion. Scattered aortic atherosclerosis. Mediastinum/Nodes: Enlarged bilateral mediastinal and hilar lymph nodes right hilar nodes measuring up to 2.5 x 1.5 cm (series 5, image 106). Thyroid gland, trachea, and esophagus demonstrate no significant findings. Lungs/Pleura: Diffuse bilateral bronchial wall thickening no pleural effusion or pneumothorax. Musculoskeletal: No chest wall abnormality. No acute osseous findings. Review of the MIP images confirms the above findings. CT ABDOMEN PELVIS FINDINGS Hepatobiliary: No focal liver abnormality is seen. Hepatic steatosis. Status post cholecystectomy. Large air and fluid collection in the gallbladder fossa measuring 8.2 x 5.7 cm (series 2, image 23). No biliary dilatation. Pancreas: Diffusely atrophic pancreas. No pancreatic ductal dilatation or surrounding inflammatory changes. Spleen: Normal in size without significant abnormality. Adrenals/Urinary Tract: Adrenal glands are unremarkable. Kidneys are normal, without renal calculi, solid lesion, or hydronephrosis. Bladder is unremarkable. Stomach/Bowel: Gastric lap band and reservoir. Stomach is otherwise within normal limits. Status post appendectomy. No evidence of bowel wall thickening, distention, or inflammatory changes. Vascular/Lymphatic: No significant vascular findings are present. No enlarged abdominal or pelvic lymph nodes.  Reproductive: No mass or other significant abnormality.  Other: No abdominal wall hernia or abnormality. No ascites. Musculoskeletal: No acute or significant osseous findings. IMPRESSION: 1. Status post cholecystectomy. Large air and fluid collection in the gallbladder fossa measuring 8.2 x 5.7 cm, consistent with postoperative abscess and/or biliary leak. 2. Negative examination for pulmonary embolism. 3. Coronary artery disease. Aortic Atherosclerosis (ICD10-I70.0). Electronically Signed   By: Jearld Lesch M.D.   On: 06/19/2023 09:42   DG Chest 2 View  Result Date: 06/19/2023 CLINICAL DATA:  Shortness of breath. EXAM: CHEST - 2 VIEW COMPARISON:  Chest two views 06/06/2023, 05/09/2023, 02/21/2022 FINDINGS: Cardiac silhouette and mediastinal contours within normal limits. The lungs are clear. No pleural effusion pneumothorax. Minimal multilevel degenerative disc changes of the thoracic spine. IMPRESSION: No active cardiopulmonary disease. Electronically Signed   By: Neita Garnet M.D.   On: 06/19/2023 08:49    EKG: Independently reviewed.  Sinus tachycardia at 100 bpm.  Nonspecific T wave flattening multiple leads.  Significant baseline wander.  Assessment/Plan Principal Problem:   Biliary anastomotic leak Active Problems:   Dyslipidemia   Obesity (BMI 30-39.9)   HTN (hypertension)   Gastroparesis   Hx of laparoscopic gastric banding   Diabetes mellitus (HCC)   OSA (obstructive sleep apnea)   Chronic diastolic CHF (congestive heart failure) (HCC)   CAD (coronary artery disease)   Intra-abdominal fluid collection > Postoperative abscess favored over biliary leak. > Delayed presentation may be secondary to receiving multiple courses of antibiotics for possible pneumonia in the interim. > Continues to have mild leukocytosis at 11.4.  Lactic acid normal.  CT showing fluid collection measuring 8.2 x 5.7 cm containing fluid and air. > Patient remains hemodynamically stable and only symptoms are nausea vomiting malaise. > General Surgery consulted in  the ED and are following.  IR consulted for drain placement. - Monitor on MedSurg - Appreciate general surgery and IR recommendations and assistance - Likely drain placement today - Continue with IV Zosyn as started in the ED - Follow-up analysis of fluid - Trend fever curve and WBC - Hold off on anticoagulation until IR intervention is successful  Hypertension - Continue home nebivolol  Hyperlipidemia - Continue home Zetia - On Repatha outpatient  Diabetes > 32 units long-acting nightly and SSI at home. - 20 units long-acting nightly and SSI  CAD > Nonobstructive by cath in 2023. - Continue home aspirin, nebivolol, Zetia - On Repatha outpatient as above  Diastolic CHF > Last echo was in 2018 with EF 55%, G1 DD, normal RV function.  Cath in 2023 estimated EF at 50-55%. - Continue nebivolol - Not on diuretic  Status post lap band surgery Obesity - Noted  OSA - Continue home CPAP  DVT prophylaxis: SCDs for now Code Status:   Full Family Communication:  Updated at bedside Disposition Plan:   Patient is from:  Home  Anticipated DC to:  Home  Anticipated DC date:  2 to 4 days  Anticipated DC barriers: None  Consults called:  General surgery, and interventional radiology Admission status:  Inpatient, MedSurg  Severity of Illness: The appropriate patient status for this patient is INPATIENT. Inpatient status is judged to be reasonable and necessary in order to provide the required intensity of service to ensure the patient's safety. The patient's presenting symptoms, physical exam findings, and initial radiographic and laboratory data in the context of their chronic comorbidities is felt to place them at high risk for further clinical deterioration. Furthermore, it is not  anticipated that the patient will be medically stable for discharge from the hospital within 2 midnights of admission.   * I certify that at the point of admission it is my clinical judgment that the  patient will require inpatient hospital care spanning beyond 2 midnights from the point of admission due to high intensity of service, high risk for further deterioration and high frequency of surveillance required.Synetta Fail MD [facility]  How to contact the [facility] Attending or Consulting provider 7A - 7P or covering provider during after hours 7P -7A, for this patient?   Check the care team in [facility] and look for a) attending/consulting [facility] provider listed and b) the [facility] team listed Log into [email or web address] and use [place]'s universal password to access. If you do not have the password, please contact the hospital operator. Locate the [facility] provider you are looking for under [facility] and page to a number that you can be directly reached. If you still have difficulty reaching the provider, please page the [facility] (Director on Call) for the Hospitalists listed on amion for assistance.  06/19/2023, 2:02 PM

## 2023-06-19 NOTE — Plan of Care (Signed)

## 2023-06-19 NOTE — Progress Notes (Signed)
   06/19/23 2306  BiPAP/CPAP/SIPAP  Reason BIPAP/CPAP not in use Non-compliant (Pt refused. States he doesn't need to wear any longer. Pt did not wish to use our equipment. Advised pt that if he changes his mind, to have RN contact RT)  BiPAP/CPAP /SiPAP Vitals  Pulse Rate 100  Resp 19  SpO2 97 %

## 2023-06-19 NOTE — Procedures (Signed)
Interventional Radiology Procedure Note  Procedure: Image guided drain placement, GB fossa.  36F pigtail drain.  Complications: None  EBL: None Sample: Culture sent  Recommendations: - Routine drain care, with sterile flushes, record output - follow up Cx - routine wound care -  diet per primary order    Signed,  Yvone Neu. Loreta Ave, DO

## 2023-06-19 NOTE — ED Notes (Signed)
Called lab to add BNP and d-dimer, spoke with Marchelle Folks.

## 2023-06-19 NOTE — ED Triage Notes (Addendum)
In for eval of nausea, vomiting, and insomnia. Denies any pain. Took zofran @ 0600 today. SOB with exertion.   05/14/2023 visit with cholecystectomy. PNA 2 weeks ago with 2nd round of antibiotics.

## 2023-06-20 ENCOUNTER — Other Ambulatory Visit (HOSPITAL_COMMUNITY): Payer: Self-pay

## 2023-06-20 DIAGNOSIS — R188 Other ascites: Secondary | ICD-10-CM | POA: Diagnosis not present

## 2023-06-20 LAB — COMPREHENSIVE METABOLIC PANEL
ALT: 10 U/L (ref 0–44)
AST: 13 U/L — ABNORMAL LOW (ref 15–41)
Albumin: 2.5 g/dL — ABNORMAL LOW (ref 3.5–5.0)
Alkaline Phosphatase: 85 U/L (ref 38–126)
Anion gap: 8 (ref 5–15)
BUN: 17 mg/dL (ref 8–23)
CO2: 27 mmol/L (ref 22–32)
Calcium: 8.8 mg/dL — ABNORMAL LOW (ref 8.9–10.3)
Chloride: 100 mmol/L (ref 98–111)
Creatinine, Ser: 0.98 mg/dL (ref 0.61–1.24)
GFR, Estimated: >60 mL/min (ref >60)
Glucose, Bld: 140 mg/dL — ABNORMAL HIGH (ref 70–99)
Potassium: 4.2 mmol/L (ref 3.5–5.1)
Sodium: 135 mmol/L (ref 135–145)
Total Bilirubin: 0.5 mg/dL (ref <1.2)
Total Protein: 6.2 g/dL — ABNORMAL LOW (ref 6.5–8.1)

## 2023-06-20 LAB — CBC
HCT: 37.8 % — ABNORMAL LOW (ref 39.0–52.0)
Hemoglobin: 12.3 g/dL — ABNORMAL LOW (ref 13.0–17.0)
MCH: 28.4 pg (ref 26.0–34.0)
MCHC: 32.5 g/dL (ref 30.0–36.0)
MCV: 87.3 fL (ref 80.0–100.0)
Platelets: 400 10*3/uL (ref 150–400)
RBC: 4.33 MIL/uL (ref 4.22–5.81)
RDW: 13 % (ref 11.5–15.5)
WBC: 8 10*3/uL (ref 4.0–10.5)
nRBC: 0 % (ref 0.0–0.2)

## 2023-06-20 LAB — GLUCOSE, CAPILLARY
Glucose-Capillary: 120 mg/dL — ABNORMAL HIGH (ref 70–99)
Glucose-Capillary: 147 mg/dL — ABNORMAL HIGH (ref 70–99)
Glucose-Capillary: 93 mg/dL (ref 70–99)
Glucose-Capillary: 95 mg/dL (ref 70–99)

## 2023-06-20 MED ORDER — AMOXICILLIN-POT CLAVULANATE 875-125 MG PO TABS
1.0000 | ORAL_TABLET | Freq: Two times a day (BID) | ORAL | 0 refills | Status: AC
  Filled 2023-06-20: qty 10, 5d supply, fill #0

## 2023-06-20 NOTE — Progress Notes (Signed)
IR Note  Patient for discharge today per [facility]. Patient will go home with drain. Please be sure patient has prescription for saline flushes. Patient should flush the drain daily with 5-10 cc sterile saline and record the output. Patient will hear from IR outpatient scheduler for time and date of follow up appointment.

## 2023-06-20 NOTE — Discharge Instructions (Signed)
Follow with Joaquim Nam, MD in 5-7 days  Please get a complete blood count and chemistry panel checked by your Primary MD at your next visit, and again as instructed by your Primary MD. Please get your medications reviewed and adjusted by your Primary MD.  Please request your Primary MD to go over all Hospital Tests and Procedure/Radiological results at the follow up, please get all Hospital records sent to your Prim MD by signing hospital release before you go home.  In some cases, there will be blood work, cultures and biopsy results pending at the time of your discharge. Please request that your primary care M.D. goes through all the records of your hospital data and follows up on these results.  If you had Pneumonia of Lung problems at the Hospital: Please get a 2 view Chest X ray done in 6-8 weeks after hospital discharge or sooner if instructed by your Primary MD.  If you have Congestive Heart Failure: Please call your Cardiologist or Primary MD anytime you have any of the following symptoms:  1) 3 pound weight gain in 24 hours or 5 pounds in 1 week  2) shortness of breath, with or without a dry hacking cough  3) swelling in the hands, feet or stomach  4) if you have to sleep on extra pillows at night in order to breathe  Follow cardiac low salt diet and 1.5 lit/day fluid restriction.  If you have diabetes Accuchecks 4 times/day, Once in AM empty stomach and then before each meal. Log in all results and show them to your primary doctor at your next visit. If any glucose reading is under 80 or above 300 call your primary MD immediately.  If you have Seizure/Convulsions/Epilepsy: Please do not drive, operate heavy machinery, participate in activities at heights or participate in high speed sports until you have seen by Primary MD or a Neurologist and advised to do so again. Per [facility] statutes, patients with seizures are not allowed to drive until they have been  seizure-free for six months.  Use caution when using heavy equipment or power tools. Avoid working on ladders or at heights. Take showers instead of baths. Ensure the water temperature is not too high on the home water heater. Do not go swimming alone. Do not lock yourself in a room alone (i.e. bathroom). When caring for infants or small children, sit down when holding, feeding, or changing them to minimize risk of injury to the child in the event you have a seizure. Maintain good sleep hygiene. Avoid alcohol.   If you had Gastrointestinal Bleeding: Please ask your Primary MD to check a complete blood count within one week of discharge or at your next visit. Your endoscopic/colonoscopic biopsies that are pending at the time of discharge, will also need to followed by your Primary MD.  Get Medicines reviewed and adjusted. Please take all your medications with you for your next visit with your Primary MD  Please request your Primary MD to go over all hospital tests and procedure/radiological results at the follow up, please ask your Primary MD to get all Hospital records sent to his/her office.  If you experience worsening of your admission symptoms, develop shortness of breath, life threatening emergency, suicidal or homicidal thoughts you must seek medical attention immediately by calling 911 or calling your MD immediately  if symptoms less severe.  You must read complete instructions/literature along with all the possible adverse reactions/side effects for all the Medicines you  take and that have been prescribed to you. Take any new Medicines after you have completely understood and accpet all the possible adverse reactions/side effects.   Do not drive or operate heavy machinery when taking Pain medications.   Do not take more than prescribed Pain, Sleep and Anxiety Medications  Special Instructions: If you have smoked or chewed Tobacco  in the last 2 yrs please stop smoking, stop any regular  Alcohol  and or any Recreational drug use.  Wear Seat belts while driving.  Please note You were cared for by a hospitalist during your hospital stay. If you have any questions about your discharge medications or the care you received while you were in the hospital after you are discharged, you can call the unit and asked to speak with the hospitalist on call if the hospitalist that took care of you is not available. Once you are discharged, your primary care physician will handle any further medical issues. Please note that NO REFILLS for any discharge medications will be authorized once you are discharged, as it is imperative that you return to your primary care physician (or establish a relationship with a primary care physician if you do not have one) for your aftercare needs so that they can reassess your need for medications and monitor your lab values.  You can reach the hospitalist office at phone [phone] or fax [phone]   If you do not have a primary care physician, you can call [phone] for a physician referral.  Activity: As tolerated with Full fall precautions use walker/cane & assistance as needed    Diet: regular  Disposition Home

## 2023-06-20 NOTE — TOC Transition Note (Signed)
Transition of Care [facility]) - CM/SW Discharge Note   Patient Details  Name: [name] MRN: [id number] Date of Birth: [birth year]  Transition of [facility]) CM/SW Contact:  Gordy Clement, RN Phone Number: 06/20/2023, 10:18 AM   Clinical Narrative:    Patient to DC to home today with Wife. No TOC needs identified. Will follow up as directed on DC instructions            Patient Goals and CMS Choice      Discharge Placement                         Discharge Plan and Services Additional resources added to the After Visit Summary for                                       Social Determinants of Health (SDOH) Interventions SDOH Screenings   Food Insecurity: No Food Insecurity (06/19/2023)  Housing: Low Risk  (06/19/2023)  Transportation Needs: No Transportation Needs (06/19/2023)  Utilities: Not At Risk (06/19/2023)  Alcohol Screen: Low Risk  (05/18/2023)  Depression (PHQ2-9): Low Risk  (06/06/2023)  Recent Concern: Depression (PHQ2-9) - Medium Risk (05/19/2023)  Financial Resource Strain: Low Risk  (05/18/2023)  Physical Activity: Sufficiently Active (05/18/2023)  Social Connections: Socially Integrated (05/18/2023)  Tobacco Use: Medium Risk (06/19/2023)     Readmission Risk Interventions     No data to display

## 2023-06-20 NOTE — Progress Notes (Signed)
Patient ID: [name], male   DOB: [birth year], 62 y.o.   MRN: [id number] [name] Surgery Progress Note     Subjective: CC-  Abdomen slightly sore after drain placement, but he denies any significant abdominal pain. Nausea has resolved. Tolerated breakfast and ambulated this morning. WBC WNL, afebrile.  Objective: Vital signs in last 24 hours: Temp:  [97.7 F (36.5 C)-98.5 F (36.9 C)] 98 F (36.7 C) (12/10 0807) Pulse Rate:  [88-106] 89 (12/10 0807) Resp:  [10-19] 18 (12/10 0807) BP: (117-147)/(42-82) 122/76 (12/10 0807) SpO2:  [95 %-100 %] 95 % (12/10 0807) Weight:  [126.2 kg-128 kg] 126.2 kg (12/10 0408) Last BM Date : 06/19/23  Intake/Output from previous day: 12/09 0701 - 12/10 0700 In: 78.4 [IV Piggyback:78.4] Out: 80 [Drains:80] Intake/Output this shift: No intake/output data recorded.  PE: Gen:  Alert, NAD, pleasant Pulm:  rate and effort normal on room air Abd: soft, non-tender, nondistended, incisions c/d/I without cellulitis, right sided drain with scant serosanguinous fluid in bulb (80cc)  Lab Results:  Recent Labs    06/19/23 0803 06/20/23 0440  WBC 11.7* 8.0  HGB 12.9* 12.3*  HCT 38.8* 37.8*  PLT 468* 400   BMET Recent Labs    06/19/23 0803 06/20/23 0440  NA 137 135  K 4.6 4.2  CL 99 100  CO2 27 27  GLUCOSE 84 140*  BUN 20 17  CREATININE 1.05 0.98  CALCIUM 9.7 8.8*   PT/INR No results for input(s): "LABPROT", "INR" in the last 72 hours. CMP     Component Value Date/Time   NA 135 06/20/2023 0440   NA 141 04/10/2023 1135   K 4.2 06/20/2023 0440   CL 100 06/20/2023 0440   CO2 27 06/20/2023 0440   GLUCOSE 140 (H) 06/20/2023 0440   GLUCOSE 260 (H) 07/24/2006 1009   BUN 17 06/20/2023 0440   BUN 14 04/10/2023 1135   CREATININE 0.98 06/20/2023 0440   CREATININE 1.02 09/27/2017 1209   CALCIUM 8.8 (L) 06/20/2023 0440   PROT 6.2 (L) 06/20/2023 0440   PROT 7.0 12/27/2019 0811   ALBUMIN 2.5 (L) 06/20/2023 0440   ALBUMIN 4.2  12/27/2019 0811   AST 13 (L) 06/20/2023 0440   ALT 10 06/20/2023 0440   ALKPHOS 85 06/20/2023 0440   BILITOT 0.5 06/20/2023 0440   BILITOT 0.4 12/27/2019 0811   GFRNONAA >60 06/20/2023 0440   GFRNONAA 81 09/27/2017 1209   GFRAA 81 05/10/2019 1129   GFRAA 93 09/27/2017 1209   Lipase     Component Value Date/Time   LIPASE 11 06/19/2023 0803       Studies/Results: CT GUIDED PERITONEAL/RETROPERITONEAL FLUID DRAIN BY PERC CATH  Result Date: 06/20/2023 INDICATION: 62 year old male referred for abscess drain EXAM: IMAGE GUIDED SUB HEPATIC ABSCESS DRAIN TECHNIQUE: Multidetector CT imaging of the abdomen was performed following the standard protocol without IV contrast. RADIATION DOSE REDUCTION: This exam was performed according to the departmental dose-optimization program which includes automated exposure control, adjustment of the mA and/or kV according to patient size and/or use of iterative reconstruction technique. MEDICATIONS: None ANESTHESIA/SEDATION: Moderate (conscious) sedation was employed during this procedure. A total of Versed 2.0 mg and Fentanyl 50 mcg was administered intravenously by the radiology nurse. Total intra-service moderate Sedation Time: 18 minutes. The patient's level of consciousness and vital signs were monitored continuously by radiology nursing throughout the procedure under my direct supervision. COMPLICATIONS: None PROCEDURE: Informed written consent was obtained from the patient after a thorough discussion of the procedural  risks, benefits and alternatives. All questions were addressed. Maximal Sterile Barrier Technique was utilized including caps, mask, sterile gowns, sterile gloves, sterile drape, hand hygiene and skin antiseptic. A timeout was performed prior to the initiation of the procedure. Patient positioned supine on the CT gantry table. Scout CT acquired for planning purposes. The patient is prepped and draped in the usual sterile fashion. 1% lidocaine was  used for local anesthesia. Using CT guidance a Yueh needle was advanced into the abscess of the right upper quadrant. Modified Seldinger technique was used to place a 12 [place] drain. Approximately 60 cc of purulent material aspirated. Drain was attached to bulb suction. Final CT was acquired. Patient tolerated procedure well and remained hemodynamically stable throughout. No complications were encountered and no significant blood loss. IMPRESSION: Status post CT-guided drainage of subhepatic abscess in the gallbladder fossa. Signed, Yvone Neu. Miachel Roux, RPVI [facility] Radiology Electronically Signed   By: Gilmer Mor D.O.   On: 06/20/2023 06:23   CT Angio Chest PE W and/or Wo Contrast  Result Date: 06/19/2023 CLINICAL DATA:  PE suspected, nausea, vomiting, abdominal pain, recent cholecystectomy EXAM: CT ANGIOGRAPHY CHEST CT ABDOMEN AND PELVIS WITH CONTRAST TECHNIQUE: Multidetector CT imaging of the chest was performed using the standard protocol during bolus administration of intravenous contrast. Multiplanar CT image reconstructions and MIPs were obtained to evaluate the vascular anatomy. Multidetector CT imaging of the abdomen and pelvis was performed using the standard protocol during bolus administration of intravenous contrast. RADIATION DOSE REDUCTION: This exam was performed according to the departmental dose-optimization program which includes automated exposure control, adjustment of the mA and/or kV according to patient size and/or use of iterative reconstruction technique. CONTRAST:  OMNIPAQUE IOHEXOL 350 MG/ML SOLN COMPARISON:  CT abdomen pelvis, 05/09/2023, CT chest angiogram, 05/30/2014 FINDINGS: CT CHEST ANGIOGRAM FINDINGS Cardiovascular: Satisfactory opacification of the pulmonary arteries to the segmental level. No evidence of pulmonary embolism. Normal heart size. Left coronary artery calcifications. No pericardial effusion.  Scattered aortic atherosclerosis. Mediastinum/Nodes: Enlarged bilateral mediastinal and hilar lymph nodes right hilar nodes measuring up to 2.5 x 1.5 cm (series 5, image 106). Thyroid gland, trachea, and esophagus demonstrate no significant findings. Lungs/Pleura: Diffuse bilateral bronchial wall thickening no pleural effusion or pneumothorax. Musculoskeletal: No chest wall abnormality. No acute osseous findings. Review of the MIP images confirms the above findings. CT ABDOMEN PELVIS FINDINGS Hepatobiliary: No focal liver abnormality is seen. Hepatic steatosis. Status post cholecystectomy. Large air and fluid collection in the gallbladder fossa measuring 8.2 x 5.7 cm (series 2, image 23). No biliary dilatation. Pancreas: Diffusely atrophic pancreas. No pancreatic ductal dilatation or surrounding inflammatory changes. Spleen: Normal in size without significant abnormality. Adrenals/Urinary Tract: Adrenal glands are unremarkable. Kidneys are normal, without renal calculi, solid lesion, or hydronephrosis. Bladder is unremarkable. Stomach/Bowel: Gastric lap band and reservoir. Stomach is otherwise within normal limits. Status post appendectomy. No evidence of bowel wall thickening, distention, or inflammatory changes. Vascular/Lymphatic: No significant vascular findings are present. No enlarged abdominal or pelvic lymph nodes. Reproductive: No mass or other significant abnormality. Other: No abdominal wall hernia or abnormality. No ascites. Musculoskeletal: No acute or significant osseous findings. IMPRESSION: 1. Status post cholecystectomy. Large air and fluid collection in the gallbladder fossa measuring 8.2 x 5.7 cm, consistent with postoperative abscess and/or biliary leak. 2. Negative examination for pulmonary embolism. 3. Coronary artery disease. Aortic Atherosclerosis (ICD10-I70.0). Electronically Signed   By: Jearld Lesch M.D.   On: 06/19/2023 09:42  CT ABDOMEN PELVIS W CONTRAST  Result Date:  06/19/2023 CLINICAL DATA:  PE suspected, nausea, vomiting, abdominal pain, recent cholecystectomy EXAM: CT ANGIOGRAPHY CHEST CT ABDOMEN AND PELVIS WITH CONTRAST TECHNIQUE: Multidetector CT imaging of the chest was performed using the standard protocol during bolus administration of intravenous contrast. Multiplanar CT image reconstructions and MIPs were obtained to evaluate the vascular anatomy. Multidetector CT imaging of the abdomen and pelvis was performed using the standard protocol during bolus administration of intravenous contrast. RADIATION DOSE REDUCTION: This exam was performed according to the departmental dose-optimization program which includes automated exposure control, adjustment of the mA and/or kV according to patient size and/or use of iterative reconstruction technique. CONTRAST:  OMNIPAQUE IOHEXOL 350 MG/ML SOLN COMPARISON:  CT abdomen pelvis, 05/09/2023, CT chest angiogram, 05/30/2014 FINDINGS: CT CHEST ANGIOGRAM FINDINGS Cardiovascular: Satisfactory opacification of the pulmonary arteries to the segmental level. No evidence of pulmonary embolism. Normal heart size. Left coronary artery calcifications. No pericardial effusion. Scattered aortic atherosclerosis. Mediastinum/Nodes: Enlarged bilateral mediastinal and hilar lymph nodes right hilar nodes measuring up to 2.5 x 1.5 cm (series 5, image 106). Thyroid gland, trachea, and esophagus demonstrate no significant findings. Lungs/Pleura: Diffuse bilateral bronchial wall thickening no pleural effusion or pneumothorax. Musculoskeletal: No chest wall abnormality. No acute osseous findings. Review of the MIP images confirms the above findings. CT ABDOMEN PELVIS FINDINGS Hepatobiliary: No focal liver abnormality is seen. Hepatic steatosis. Status post cholecystectomy. Large air and fluid collection in the gallbladder fossa measuring 8.2 x 5.7 cm (series 2, image 23). No biliary dilatation. Pancreas: Diffusely atrophic pancreas. No pancreatic  ductal dilatation or surrounding inflammatory changes. Spleen: Normal in size without significant abnormality. Adrenals/Urinary Tract: Adrenal glands are unremarkable. Kidneys are normal, without renal calculi, solid lesion, or hydronephrosis. Bladder is unremarkable. Stomach/Bowel: Gastric lap band and reservoir. Stomach is otherwise within normal limits. Status post appendectomy. No evidence of bowel wall thickening, distention, or inflammatory changes. Vascular/Lymphatic: No significant vascular findings are present. No enlarged abdominal or pelvic lymph nodes. Reproductive: No mass or other significant abnormality. Other: No abdominal wall hernia or abnormality. No ascites. Musculoskeletal: No acute or significant osseous findings. IMPRESSION: 1. Status post cholecystectomy. Large air and fluid collection in the gallbladder fossa measuring 8.2 x 5.7 cm, consistent with postoperative abscess and/or biliary leak. 2. Negative examination for pulmonary embolism. 3. Coronary artery disease. Aortic Atherosclerosis (ICD10-I70.0). Electronically Signed   By: Jearld Lesch M.D.   On: 06/19/2023 09:42   DG Chest 2 View  Result Date: 06/19/2023 CLINICAL DATA:  Shortness of breath. EXAM: CHEST - 2 VIEW COMPARISON:  Chest two views 06/06/2023, 05/09/2023, 02/21/2022 FINDINGS: Cardiac silhouette and mediastinal contours within normal limits. The lungs are clear. No pleural effusion pneumothorax. Minimal multilevel degenerative disc changes of the thoracic spine. IMPRESSION: No active cardiopulmonary disease. Electronically Signed   By: Neita Garnet M.D.   On: 06/19/2023 08:49    Anti-infectives: Anti-infectives (From admission, onward)    Start     Dose/Rate Route Frequency Ordered Stop   06/19/23 1700  piperacillin-tazobactam (ZOSYN) IVPB 3.375 g        3.375 g 12.5 mL/hr over 240 Minutes Intravenous Every 8 hours 06/19/23 1009     06/19/23 1015  piperacillin-tazobactam (ZOSYN) IVPB 3.375 g        3.375 g 100  mL/hr over 30 Minutes Intravenous  Once 06/19/23 1009 06/19/23 1104        Assessment/Plan RUQ fluid collection s/p laparoscopic cholecystectomy 05/15/2023 by Dr. Janee Morn where gallbladder empyema  was noted and surgicel snow was used in the gallbladder fossa for hemostasis.  - s/p IR drain placement 12/9, approximately 60 cc of purulent material aspirated, culture pending - WBC WNL, afebrile. LFTs WNL. No signs of bile leak. -  Patient is stable for discharge from surgical standpoint. Recommend going home with drain and 5 days of antibiotics. He will need IR follow up. I have asked his nurse to teach him how to perform drain care. Surgical follow up on AVS.   FEN - regular diet VTE - SCD's ID - Zosyn Admit - TRH service   Below per primary team --  Recent history of CAP s/p outpatient PO abx HTD HLD CAD  DM2 OSA Obesity     LOS: 1 day    Franne Forts, [facility] 06/20/2023, 9:37 AM Please see Amion for pager number during day hours 7:00am-4:30pm

## 2023-06-20 NOTE — Discharge Summary (Signed)
Physician Discharge Summary  JIOVANNY CARMONA UJW:[id number] DOB: [birth year] DOA: 06/19/2023  PCP: Joaquim Nam, MD  Admit date: 06/19/2023 Discharge date: 06/20/2023  Admitted From: home Disposition:  home  Recommendations for Outpatient Follow-up:  Follow up with PCP in 1-2 weeks Follow-up with general surgery as scheduled IR will arrange follow-up  Home Health: none Equipment/Devices: none  Discharge Condition: stable CODE STATUS: Full code Diet Orders (From admission, onward)     Start     Ordered   06/19/23 1639  Diet regular Fluid consistency: Thin  Diet effective now       Question:  Fluid consistency:  Answer:  Thin   06/19/23 1639            HPI: Per admitting MD, CHARLENE MURRIE is a 62 y.o. male with medical history significant of hypertension, hyperlipidemia, diabetes, gastroparesis, CAD, diastolic CHF, history of lap band, obesity, OSA, recent admission for cholecystitis presenting with nausea vomiting and malaise. As above patient was recently admitted for cholecystitis.  He was admitted for 11/3 until 11/6.  Present with abdominal pain found to have cholecystitis and underwent successful laparoscopic cholecystectomy on 11/4.  Postop notes indicate that empyema of the gallbladder was also in noted.  Patient had good recovery and was discharged home.   Was doing well at home for a while but had some shortness of breath.  Chest x-ray obtained outpatient and showed possible atelectasis versus infection on 11/24.  Was prescribed a course of Augmentin and azithromycin and translate felt better but then symptoms returned and he was seen again on 12/5 and prescribed Levaquin which he remains on. He denies fevers, chills, chest pain, shortness of breath, abdominal pain, constipation, diarrhea.  Hospital Course / Discharge diagnoses: Principal Problem:   Intra-abdominal fluid collection Active Problems:   Dyslipidemia   Obesity (BMI 30-39.9)   HTN (hypertension)    Gastroparesis   Hx of laparoscopic gastric banding   Diabetes mellitus (HCC)   OSA (obstructive sleep apnea)   Chronic diastolic CHF (congestive heart failure) (HCC)   CAD (coronary artery disease)   Principal problem Intra-abdominal fluid collection -this is following his laparoscopic cholecystectomy several weeks ago.  A postoperative abscess is favored over the biliary leak, and his delayed presentation may have been secondary to received multiple courses of antibiotics for possible pneumonia as an outpatient.  IR was consulted as well as general surgery.  Underwent drain placement and initially placed on Zosyn.  General surgery evaluated the patient the next day, he appears stable, improved, clinically back to baseline and will be discharged home in stable condition with 5 additional days of Augmentin per general surgery recommendations.  It is unlikely that his microbiology will speciate anything given the fact that he has been on and off antibiotics prior to drainage.  Cultures from the fluid collection are pending at the time of discharge though.  Will have follow-up with general surgery as well as IR  Active problems Essential hypertension-resume home medications Hyperlipidemia-resume home medications CAD-nonobstructive by cath in 2023, continue medications Diastolic CHF-appears euvolemic Obesity, status post lap band surgery -would benefit from additional weight loss OSA-not using CPAP anymore DM2, poorly controlled-last A1c in the 8 range.  Continue medications and outpatient follow-up  Sepsis ruled out   Discharge Instructions   Allergies as of 06/20/2023       Reactions   Diclofenac Sodium Shortness Of Breath, Itching, Other (See Comments)   Hypotension   Statins Shortness Of Breath  Voltaren [diclofenac Sodium] Anaphylaxis   Lipitor [atorvastatin]    Myalgias at 80mg  dose   Metoprolol    Lightheaded/dizzy/SOB.     Ramipril    Lightheaded/dizzy/SOB.           Medication List     STOP taking these medications    levofloxacin 500 MG tablet Commonly known as: LEVAQUIN       TAKE these medications    acetaminophen 500 MG tablet Commonly known as: TYLENOL Take 1,000 mg by mouth once as needed for moderate pain (pain score 4-6).   albuterol 108 (90 Base) MCG/ACT inhaler Commonly known as: VENTOLIN HFA Inhale 1-2 puffs into the lungs every 6 (six) hours as needed for wheezing or shortness of breath.   albuterol (2.5 MG/3ML) 0.083% nebulizer solution Commonly known as: PROVENTIL Take 3 mLs (2.5 mg total) by nebulization every 6 (six) hours as needed for wheezing or shortness of breath.   amoxicillin-clavulanate 875-125 MG tablet Commonly known as: AUGMENTIN Take 1 tablet by mouth 2 (two) times daily for 5 days.   aspirin EC 81 MG tablet Take 81 mg by mouth daily.   cyclobenzaprine 5 MG tablet Commonly known as: FLEXERIL TAKE ONE TABLET BY MOUTH EVERY NIGHT AT BEDTIME   Dexcom G7 Sensor Misc See admin instructions.   ezetimibe 10 MG tablet Commonly known as: ZETIA Take 1 tablet (10 mg total) by mouth daily.   FLUoxetine 20 MG tablet Commonly known as: PROZAC TAKE ONE TABLET BY MOUTH ONCE A DAY   gabapentin 100 MG capsule Commonly known as: NEURONTIN TAKE ONE TO THREE CAPSULES BY MOUTH THREE TIMES DAILY AS NEEDED   insulin degludec 200 UNIT/ML FlexTouch Pen Commonly known as: TRESIBA Inject 32 Units into the skin every evening.   Jardiance 25 MG Tabs tablet Generic drug: empagliflozin TAKE 1 TABLET BY MOUTH ONCE A DAY   metFORMIN 1000 MG tablet Commonly known as: GLUCOPHAGE TAKE 1 TABLET (1,000 MG TOTAL) BY MOUTH 2 (TWO) TIMES DAILY WITH A MEAL.   multivitamin capsule Take 1 capsule by mouth daily.   nebivolol 5 MG tablet Commonly known as: BYSTOLIC TAKE ONE TABLET (5 MG TOTAL) BY MOUTH DAILY. PLEASE KEEP SCHEDULED APPOINTMENT FOR FUTURE REFILLS. THANK YOU.   nitroGLYCERIN 0.4 MG SL tablet Commonly known as:  NITROSTAT Place 1 tablet (0.4 mg total) under the tongue every 5 (five) minutes as needed for chest pain.   NovoLOG FlexPen 100 UNIT/ML FlexPen Generic drug: insulin aspart Per sliding scale   omeprazole 20 MG capsule Commonly known as: PRILOSEC Take 20 mg by mouth daily.   ondansetron 4 MG tablet Commonly known as: ZOFRAN Take 1 tablet (4 mg total) by mouth 4 (four) times daily as needed for nausea.   Repatha SureClick 140 MG/ML Soaj Generic drug: Evolocumab Inject 140 mg into the skin every 14 (fourteen) days.   Trulicity 4.5 MG/0.5ML Soaj Generic drug: Dulaglutide Inject 4.5 mg into the skin once a week. Saturday or Sunday- Last injection was 05/06/2023.   Vitamin D (Ergocalciferol) 1.25 MG (50000 UNIT) Caps capsule Commonly known as: DRISDOL Take 1 capsule (50,000 Units total) by mouth every 7 (seven) days.        Follow-up Information     Violeta Gelinas, MD. Go on 07/06/2023.   Specialty: General Surgery Why: Your appointment is 12/26 at 3:20pm Arrive 15 minutes early for check in. Contact information: [address] [place] [address] [phone]  Consultations: General Surgery IR  Procedures/Studies:  CT GUIDED PERITONEAL/RETROPERITONEAL FLUID DRAIN BY PERC CATH  Result Date: 06/20/2023 INDICATION: 62 year old male referred for abscess drain EXAM: IMAGE GUIDED SUB HEPATIC ABSCESS DRAIN TECHNIQUE: Multidetector CT imaging of the abdomen was performed following the standard protocol without IV contrast. RADIATION DOSE REDUCTION: This exam was performed according to the departmental dose-optimization program which includes automated exposure control, adjustment of the mA and/or kV according to patient size and/or use of iterative reconstruction technique. MEDICATIONS: None ANESTHESIA/SEDATION: Moderate (conscious) sedation was employed during this procedure. A total of Versed 2.0 mg and Fentanyl 50 mcg was administered  intravenously by the radiology nurse. Total intra-service moderate Sedation Time: 18 minutes. The patient's level of consciousness and vital signs were monitored continuously by radiology nursing throughout the procedure under my direct supervision. COMPLICATIONS: None PROCEDURE: Informed written consent was obtained from the patient after a thorough discussion of the procedural risks, benefits and alternatives. All questions were addressed. Maximal Sterile Barrier Technique was utilized including caps, mask, sterile gowns, sterile gloves, sterile drape, hand hygiene and skin antiseptic. A timeout was performed prior to the initiation of the procedure. Patient positioned supine on the CT gantry table. Scout CT acquired for planning purposes. The patient is prepped and draped in the usual sterile fashion. 1% lidocaine was used for local anesthesia. Using CT guidance a Yueh needle was advanced into the abscess of the right upper quadrant. Modified Seldinger technique was used to place a 12 [place] drain. Approximately 60 cc of purulent material aspirated. Drain was attached to bulb suction. Final CT was acquired. Patient tolerated procedure well and remained hemodynamically stable throughout. No complications were encountered and no significant blood loss. IMPRESSION: Status post CT-guided drainage of subhepatic abscess in the gallbladder fossa. Signed, Yvone Neu. Miachel Roux, RPVI [facility] Radiology Electronically Signed   By: Gilmer Mor D.O.   On: 06/20/2023 06:23   CT Angio Chest PE W and/or Wo Contrast  Result Date: 06/19/2023 CLINICAL DATA:  PE suspected, nausea, vomiting, abdominal pain, recent cholecystectomy EXAM: CT ANGIOGRAPHY CHEST CT ABDOMEN AND PELVIS WITH CONTRAST TECHNIQUE: Multidetector CT imaging of the chest was performed using the standard protocol during bolus administration of intravenous contrast. Multiplanar CT image reconstructions  and MIPs were obtained to evaluate the vascular anatomy. Multidetector CT imaging of the abdomen and pelvis was performed using the standard protocol during bolus administration of intravenous contrast. RADIATION DOSE REDUCTION: This exam was performed according to the departmental dose-optimization program which includes automated exposure control, adjustment of the mA and/or kV according to patient size and/or use of iterative reconstruction technique. CONTRAST:  OMNIPAQUE IOHEXOL 350 MG/ML SOLN COMPARISON:  CT abdomen pelvis, 05/09/2023, CT chest angiogram, 05/30/2014 FINDINGS: CT CHEST ANGIOGRAM FINDINGS Cardiovascular: Satisfactory opacification of the pulmonary arteries to the segmental level. No evidence of pulmonary embolism. Normal heart size. Left coronary artery calcifications. No pericardial effusion. Scattered aortic atherosclerosis. Mediastinum/Nodes: Enlarged bilateral mediastinal and hilar lymph nodes right hilar nodes measuring up to 2.5 x 1.5 cm (series 5, image 106). Thyroid gland, trachea, and esophagus demonstrate no significant findings. Lungs/Pleura: Diffuse bilateral bronchial wall thickening no pleural effusion or pneumothorax. Musculoskeletal: No chest wall abnormality. No acute osseous findings. Review of the MIP images confirms the above findings. CT ABDOMEN PELVIS FINDINGS Hepatobiliary: No focal liver abnormality is seen. Hepatic steatosis. Status post cholecystectomy. Large air and fluid collection in the gallbladder fossa measuring 8.2 x 5.7 cm (series 2, image 23). No  biliary dilatation. Pancreas: Diffusely atrophic pancreas. No pancreatic ductal dilatation or surrounding inflammatory changes. Spleen: Normal in size without significant abnormality. Adrenals/Urinary Tract: Adrenal glands are unremarkable. Kidneys are normal, without renal calculi, solid lesion, or hydronephrosis. Bladder is unremarkable. Stomach/Bowel: Gastric lap band and reservoir. Stomach is otherwise within  normal limits. Status post appendectomy. No evidence of bowel wall thickening, distention, or inflammatory changes. Vascular/Lymphatic: No significant vascular findings are present. No enlarged abdominal or pelvic lymph nodes. Reproductive: No mass or other significant abnormality. Other: No abdominal wall hernia or abnormality. No ascites. Musculoskeletal: No acute or significant osseous findings. IMPRESSION: 1. Status post cholecystectomy. Large air and fluid collection in the gallbladder fossa measuring 8.2 x 5.7 cm, consistent with postoperative abscess and/or biliary leak. 2. Negative examination for pulmonary embolism. 3. Coronary artery disease. Aortic Atherosclerosis (ICD10-I70.0). Electronically Signed   By: Jearld Lesch M.D.   On: 06/19/2023 09:42   CT ABDOMEN PELVIS W CONTRAST  Result Date: 06/19/2023 CLINICAL DATA:  PE suspected, nausea, vomiting, abdominal pain, recent cholecystectomy EXAM: CT ANGIOGRAPHY CHEST CT ABDOMEN AND PELVIS WITH CONTRAST TECHNIQUE: Multidetector CT imaging of the chest was performed using the standard protocol during bolus administration of intravenous contrast. Multiplanar CT image reconstructions and MIPs were obtained to evaluate the vascular anatomy. Multidetector CT imaging of the abdomen and pelvis was performed using the standard protocol during bolus administration of intravenous contrast. RADIATION DOSE REDUCTION: This exam was performed according to the departmental dose-optimization program which includes automated exposure control, adjustment of the mA and/or kV according to patient size and/or use of iterative reconstruction technique. CONTRAST:  OMNIPAQUE IOHEXOL 350 MG/ML SOLN COMPARISON:  CT abdomen pelvis, 05/09/2023, CT chest angiogram, 05/30/2014 FINDINGS: CT CHEST ANGIOGRAM FINDINGS Cardiovascular: Satisfactory opacification of the pulmonary arteries to the segmental level. No evidence of pulmonary embolism. Normal heart size. Left coronary artery  calcifications. No pericardial effusion. Scattered aortic atherosclerosis. Mediastinum/Nodes: Enlarged bilateral mediastinal and hilar lymph nodes right hilar nodes measuring up to 2.5 x 1.5 cm (series 5, image 106). Thyroid gland, trachea, and esophagus demonstrate no significant findings. Lungs/Pleura: Diffuse bilateral bronchial wall thickening no pleural effusion or pneumothorax. Musculoskeletal: No chest wall abnormality. No acute osseous findings. Review of the MIP images confirms the above findings. CT ABDOMEN PELVIS FINDINGS Hepatobiliary: No focal liver abnormality is seen. Hepatic steatosis. Status post cholecystectomy. Large air and fluid collection in the gallbladder fossa measuring 8.2 x 5.7 cm (series 2, image 23). No biliary dilatation. Pancreas: Diffusely atrophic pancreas. No pancreatic ductal dilatation or surrounding inflammatory changes. Spleen: Normal in size without significant abnormality. Adrenals/Urinary Tract: Adrenal glands are unremarkable. Kidneys are normal, without renal calculi, solid lesion, or hydronephrosis. Bladder is unremarkable. Stomach/Bowel: Gastric lap band and reservoir. Stomach is otherwise within normal limits. Status post appendectomy. No evidence of bowel wall thickening, distention, or inflammatory changes. Vascular/Lymphatic: No significant vascular findings are present. No enlarged abdominal or pelvic lymph nodes. Reproductive: No mass or other significant abnormality. Other: No abdominal wall hernia or abnormality. No ascites. Musculoskeletal: No acute or significant osseous findings. IMPRESSION: 1. Status post cholecystectomy. Large air and fluid collection in the gallbladder fossa measuring 8.2 x 5.7 cm, consistent with postoperative abscess and/or biliary leak. 2. Negative examination for pulmonary embolism. 3. Coronary artery disease. Aortic Atherosclerosis (ICD10-I70.0). Electronically Signed   By: Jearld Lesch M.D.   On: 06/19/2023 09:42   DG Chest 2  View  Result Date: 06/19/2023 CLINICAL DATA:  Shortness of breath. EXAM: CHEST - 2 VIEW COMPARISON:  Chest two views 06/06/2023, 05/09/2023, 02/21/2022 FINDINGS: Cardiac silhouette and mediastinal contours within normal limits. The lungs are clear. No pleural effusion pneumothorax. Minimal multilevel degenerative disc changes of the thoracic spine. IMPRESSION: No active cardiopulmonary disease. Electronically Signed   By: Neita Garnet M.D.   On: 06/19/2023 08:49   DG Chest 2 View  Result Date: 06/06/2023 CLINICAL DATA:  cough, doe, wheezing, recent surgery EXAM: CHEST - 2 VIEW COMPARISON:  CXR 05/09/23 FINDINGS: Pleural effusion. No pneumothorax. Normal cardiac and mediastinal contours. No radiographically apparent displaced rib fractures. Visualized upper abdomen unremarkable. There is a focal airspace opacity in the right infrahilar region, which could represent atelectasis or infection. Visualized upper abdomen unremarkable. Vertebral body heights are maintained. IMPRESSION: Focal airspace opacity in the right infrahilar region, which could represent atelectasis or infection. Electronically Signed   By: Lorenza Cambridge M.D.   On: 06/06/2023 09:27     Subjective: - no chest pain, shortness of breath, no abdominal pain, nausea or vomiting.   Discharge Exam: BP 122/76   Pulse 89   Temp 98 F (36.7 C)   Resp 18   Ht 6\' 1"  (1.854 m)   Wt 126.2 kg   SpO2 95%   BMI 36.71 kg/m   General: Pt is alert, awake, not in acute distress Cardiovascular: RRR, S1/S2 +, no rubs, no gallops Respiratory: CTA bilaterally, no wheezing, no rhonchi Abdominal: Soft, NT, ND, bowel sounds + Extremities: no edema, no cyanosis    The results of significant diagnostics from this hospitalization (including imaging, microbiology, ancillary and laboratory) are listed below for reference.     Microbiology: Recent Results (from the past 240 hour(s))  Resp panel by RT-PCR (RSV, Flu A&B, Covid) Anterior Nasal Swab      Status: None   Collection Time: 06/19/23  8:26 AM   Specimen: Anterior Nasal Swab  Result Value Ref Range Status   SARS Coronavirus 2 by RT PCR NEGATIVE NEGATIVE Final    Comment: (NOTE) SARS-CoV-2 target nucleic acids are NOT DETECTED.  The SARS-CoV-2 RNA is generally detectable in upper respiratory specimens during the acute phase of infection. The lowest concentration of SARS-CoV-2 viral copies this assay can detect is 138 copies/mL. A negative result does not preclude SARS-Cov-2 infection and should not be used as the sole basis for treatment or other patient management decisions. A negative result may occur with  improper specimen collection/handling, submission of specimen other than nasopharyngeal swab, presence of viral mutation(s) within the areas targeted by this assay, and inadequate number of viral copies(<138 copies/mL). A negative result must be combined with clinical observations, patient history, and epidemiological information. The expected result is Negative.  Fact Sheet for Patients:  BloggerCourse.com  Fact Sheet for Healthcare Providers:  SeriousBroker.it  This test is no t yet approved or cleared by the [place] FDA and  has been authorized for detection and/or diagnosis of SARS-CoV-2 by FDA under an Emergency Use Authorization (EUA). This EUA will remain  in effect (meaning this test can be used) for the duration of the COVID-19 declaration under Section 564(b)(1) of the Act, 21 U.S.C.section 360bbb-3(b)(1), unless the authorization is terminated  or revoked sooner.       Influenza A by PCR NEGATIVE NEGATIVE Final   Influenza B by PCR NEGATIVE NEGATIVE Final    Comment: (NOTE) The Xpert Xpress SARS-CoV-2/FLU/RSV plus assay is intended as an aid in the diagnosis of influenza from Nasopharyngeal swab specimens and should not be used as a sole basis for  treatment. Nasal washings and aspirates are  unacceptable for Xpert Xpress SARS-CoV-2/FLU/RSV testing.  Fact Sheet for Patients: BloggerCourse.com  Fact Sheet for Healthcare Providers: SeriousBroker.it  This test is not yet approved or cleared by the [place] FDA and has been authorized for detection and/or diagnosis of SARS-CoV-2 by FDA under an Emergency Use Authorization (EUA). This EUA will remain in effect (meaning this test can be used) for the duration of the COVID-19 declaration under Section 564(b)(1) of the Act, 21 U.S.C. section 360bbb-3(b)(1), unless the authorization is terminated or revoked.     Resp Syncytial Virus by PCR NEGATIVE NEGATIVE Final    Comment: (NOTE) Fact Sheet for Patients: BloggerCourse.com  Fact Sheet for Healthcare Providers: SeriousBroker.it  This test is not yet approved or cleared by the [place] FDA and has been authorized for detection and/or diagnosis of SARS-CoV-2 by FDA under an Emergency Use Authorization (EUA). This EUA will remain in effect (meaning this test can be used) for the duration of the COVID-19 declaration under Section 564(b)(1) of the Act, 21 U.S.C. section 360bbb-3(b)(1), unless the authorization is terminated or revoked.  Performed at [facility], [address] [place], [place] [address]   Aerobic/Anaerobic Culture w Gram Stain (surgical/deep wound)     Status: None (Preliminary result)   Collection Time: 06/19/23  4:36 PM   Specimen: Abdomen; Abscess  Result Value Ref Range Status   Specimen Description ABDOMEN ABSCESS  Final   Special Requests NONE  Final   Gram Stain   Final    ABUNDANT WBC PRESENT, PREDOMINANTLY PMN NO ORGANISMS SEEN    Culture   Final    NO GROWTH < 24 HOURS Performed at [facility], [address], [place], [place] [address]    Report Status PENDING  Incomplete     Labs: Basic Metabolic  Panel: Recent Labs  Lab 06/15/23 1235 06/19/23 0803 06/20/23 0440  NA  --  137 135  K 4.8 4.6 4.2  CL  --  99 100  CO2  --  27 27  GLUCOSE  --  84 140*  BUN  --  20 17  CREATININE  --  1.05 0.98  CALCIUM  --  9.7 8.8*   Liver Function Tests: Recent Labs  Lab 06/19/23 0803 06/20/23 0440  AST 10* 13*  ALT 7 10  ALKPHOS 108 85  BILITOT 0.5 0.5  PROT 7.5 6.2*  ALBUMIN 3.7 2.5*   CBC: Recent Labs  Lab 06/15/23 1235 06/19/23 0803 06/20/23 0440  WBC 13.6* 11.7* 8.0  NEUTROABS 8.2*  --   --   HGB 13.9 12.9* 12.3*  HCT 41.6 38.8* 37.8*  MCV 89.2 86.8 87.3  PLT 565.0* 468* 400   CBG: Recent Labs  Lab 06/19/23 1915 06/19/23 2029 06/20/23 0027 06/20/23 0407 06/20/23 0807  GLUCAP 79 154* 120* 147* 95   Hgb A1c No results for input(s): "HGBA1C" in the last 72 hours. Lipid Profile No results for input(s): "CHOL", "HDL", "LDLCALC", "TRIG", "CHOLHDL", "LDLDIRECT" in the last 72 hours. Thyroid function studies No results for input(s): "TSH", "T4TOTAL", "T3FREE", "THYROIDAB" in the last 72 hours.  Invalid input(s): "FREET3" Urinalysis    Component Value Date/Time   COLORURINE YELLOW 05/14/2023 1801   APPEARANCEUR CLEAR 05/14/2023 1801   LABSPEC 1.029 05/14/2023 1801   PHURINE 6.0 05/14/2023 1801   GLUCOSEU >1,000 (A) 05/14/2023 1801   HGBUR TRACE (A) 05/14/2023 1801   BILIRUBINUR NEGATIVE 05/14/2023 1801   BILIRUBINUR neg 05/28/2014 1456  KETONESUR 40 (A) 05/14/2023 1801   PROTEINUR NEGATIVE 05/14/2023 1801   UROBILINOGEN 0.2 05/28/2014 1456   UROBILINOGEN 1.0 01/10/2009 0001   NITRITE NEGATIVE 05/14/2023 1801   LEUKOCYTESUR NEGATIVE 05/14/2023 1801    FURTHER DISCHARGE INSTRUCTIONS:   Get Medicines reviewed and adjusted: Please take all your medications with you for your next visit with your Primary MD   Laboratory/radiological data: Please request your Primary MD to go over all hospital tests and procedure/radiological results at the follow up,  please ask your Primary MD to get all Hospital records sent to his/her office.   In some cases, they will be blood work, cultures and biopsy results pending at the time of your discharge. Please request that your primary care M.D. goes through all the records of your hospital data and follows up on these results.   Also Note the following: If you experience worsening of your admission symptoms, develop shortness of breath, life threatening emergency, suicidal or homicidal thoughts you must seek medical attention immediately by calling 911 or calling your MD immediately  if symptoms less severe.   You must read complete instructions/literature along with all the possible adverse reactions/side effects for all the Medicines you take and that have been prescribed to you. Take any new Medicines after you have completely understood and accpet all the possible adverse reactions/side effects.    Do not drive when taking Pain medications or sleeping medications (Benzodaizepines)   Do not take more than prescribed Pain, Sleep and Anxiety Medications. It is not advisable to combine anxiety,sleep and pain medications without talking with your primary care practitioner   Special Instructions: If you have smoked or chewed Tobacco  in the last 2 yrs please stop smoking, stop any regular Alcohol  and or any Recreational drug use.   Wear Seat belts while driving.   Please note: You were cared for by a hospitalist during your hospital stay. Once you are discharged, your primary care physician will handle any further medical issues. Please note that NO REFILLS for any discharge medications will be authorized once you are discharged, as it is imperative that you return to your primary care physician (or establish a relationship with a primary care physician if you do not have one) for your post hospital discharge needs so that they can reassess your need for medications and monitor your lab values.  Time  coordinating discharge: 35 minutes  SIGNED:  Pamella Pert, MD, PhD 06/20/2023, 10:02 AM

## 2023-06-21 ENCOUNTER — Telehealth: Payer: Self-pay

## 2023-06-21 NOTE — Transitions of Care (Post Inpatient/ED Visit) (Signed)
   06/21/2023  Name: [name] MRN: [id number] DOB: May 03, 1961  Today's TOC FU Call Status: Today's TOC FU Call Status:: Unsuccessful Call (1st Attempt) Unsuccessful Call (1st Attempt) Date: 06/21/23  Attempted to reach the patient regarding the most recent Inpatient/ED visit.  Follow Up Plan: Additional outreach attempts will be made to reach the patient to complete the Transitions of Care (Post Inpatient/ED visit) call.   Deidre Ala, RN Medical illustrator [facility] [phone]

## 2023-06-22 ENCOUNTER — Other Ambulatory Visit: Payer: Self-pay | Admitting: General Surgery

## 2023-06-22 DIAGNOSIS — R188 Other ascites: Secondary | ICD-10-CM

## 2023-06-23 ENCOUNTER — Telehealth: Payer: Self-pay

## 2023-06-23 NOTE — Transitions of Care (Post Inpatient/ED Visit) (Signed)
06/23/2023  Name: [name] MRN: [id number] DOB: October 28, 1960  Today's TOC FU Call Status: Today's TOC FU Call Status:: Successful TOC FU Call Completed Unsuccessful Call (1st Attempt) Date: 06/22/23 [facility] FU Call Complete Date: 06/23/23 Patient's Name and Date of Birth confirmed.  Transition Care Management Follow-up Telephone Call Date of Discharge: 06/20/23 Discharge Facility: [facility]) Type of Discharge: Inpatient Admission Primary Inpatient Discharge Diagnosis:: Intra-abdominal fluid collection How have you been since you were released from the hospital?: Better Any questions or concerns?: No  Items Reviewed: Did you receive and understand the discharge instructions provided?: Yes Medications obtained,verified, and reconciled?: Yes (Medications Reviewed) Any new allergies since your discharge?: No Dietary orders reviewed?: Yes Type of Diet Ordered:: Reg Heart Healthy Do you have support at home?:  (Spouse and Daughter is staying with them a few days) People in Home: spouse Name of Support/Comfort Primary Source: Spouse Amanda  Medications Reviewed Today: Medications Reviewed Today     Reviewed by Johnnette Barrios, RN (Registered Nurse) on 06/23/23 at 1418  Med List Status: <None>   Medication Order Taking? Sig Documenting Provider Last Dose Status Informant  acetaminophen (TYLENOL) 500 MG tablet [id number] Yes Take 1,000 mg by mouth once as needed for moderate pain (pain score 4-6). [provider] Taking Active Self  albuterol (PROVENTIL) (2.5 MG/3ML) 0.083% nebulizer solution [id number] Yes Take 3 mLs (2.5 mg total) by nebulization every 6 (six) hours as needed for wheezing or shortness of breath. Eden Emms, NP Taking Active   albuterol (VENTOLIN HFA) 108 (90 Base) MCG/ACT inhaler [id number] Yes Inhale 1-2 puffs into the lungs every 6 (six) hours as needed for wheezing or shortness of breath. Oretha Milch, MD Taking Active Self  amoxicillin-clavulanate  (AUGMENTIN) 875-125 MG tablet [id number] Yes Take 1 tablet by mouth 2 (two) times daily for 5 days. Leatha Gilding, MD Taking Active   aspirin EC 81 MG tablet [id number] Yes Take 81 mg by mouth daily. [provider] Taking Active Self  Continuous Glucose Sensor (DEXCOM G7 SENSOR) MISC [id number] Yes See admin instructions. [provider] Taking Active   cyclobenzaprine (FLEXERIL) 5 MG tablet [id number] Yes TAKE ONE TABLET BY MOUTH EVERY NIGHT AT BEDTIME Joaquim Nam, MD Taking Active Self  Dulaglutide (TRULICITY) 4.5 MG/0.5ML SOPN [id number] Yes Inject 4.5 mg into the skin once a week. Saturday or Sunday- Last injection was 05/06/2023. [provider] Taking Active Self  Evolocumab (REPATHA SURECLICK) 140 MG/ML Ivory Broad [id number] Yes Inject 140 mg into the skin every 14 (fourteen) days. Perlie Gold, PA-C Taking Active Self  ezetimibe (ZETIA) 10 MG tablet [id number] Yes Take 1 tablet (10 mg total) by mouth daily. Kathleene Hazel, MD Taking Active Self  FLUoxetine (PROZAC) 20 MG tablet [id number] Yes TAKE ONE TABLET BY MOUTH ONCE A DAY Joaquim Nam, MD Taking Active Self  gabapentin (NEURONTIN) 100 MG capsule [id number] Yes TAKE ONE TO THREE CAPSULES BY MOUTH THREE TIMES DAILY AS NEEDED Joaquim Nam, MD Taking Active   insulin aspart (NOVOLOG FLEXPEN) 100 UNIT/ML FlexPen [id number] Yes Per sliding scale Joaquim Nam, MD Taking Active Self  insulin degludec (TRESIBA) 200 UNIT/ML FlexTouch Pen [id number] Yes Inject 32 Units into the skin every evening. Joaquim Nam, MD Taking Active Self  JARDIANCE 25 MG TABS tablet [id number] Yes TAKE 1 TABLET BY MOUTH ONCE A Sammuel Bailiff, MD Taking Active Self  metFORMIN (GLUCOPHAGE) 1000 MG tablet [id number] Yes TAKE 1 TABLET (1,000 MG TOTAL) BY  MOUTH 2 (TWO) TIMES DAILY WITH A MEAL. Carlus Pavlov, MD Taking Active Self  Multiple Vitamin (MULTIVITAMIN) capsule [id number] Yes Take 1 capsule by mouth daily.  [provider] Taking Active Self  nebivolol (BYSTOLIC) 5 MG tablet [id number] Yes TAKE ONE TABLET (5 MG TOTAL) BY MOUTH DAILY. PLEASE KEEP SCHEDULED APPOINTMENT FOR FUTURE REFILLS. THANK YOU. Kathleene Hazel, MD Taking Active Self  nitroGLYCERIN (NITROSTAT) 0.4 MG SL tablet [id number]  Place 1 tablet (0.4 mg total) under the tongue every 5 (five) minutes as needed for chest pain. Creig Hines, NP  Expired 05/15/23 2359 Self           Med Note Sharon Seller, Ingris Pasquarella L   Fri Jun 23, 2023  2:17 PM) Patient has some   omeprazole (PRILOSEC) 20 MG capsule [id number] Yes Take 20 mg by mouth daily. [provider] Taking Active Self  ondansetron (ZOFRAN) 4 MG tablet [id number] Yes Take 1 tablet (4 mg total) by mouth 4 (four) times daily as needed for nausea. Joaquim Nam, MD Taking Active   Vitamin D, Ergocalciferol, (DRISDOL) 1.25 MG (50000 UNIT) CAPS capsule [id number] Yes Take 1 capsule (50,000 Units total) by mouth every 7 (seven) days. Worthy Rancher, MD Taking Active Self            Home Care and Equipment/Supplies: Were Home Health Services Ordered?: No Any new equipment or medical supplies ordered?: No  Functional Questionnaire: Do you need assistance with bathing/showering or dressing?: No Do you need assistance with meal preparation?: No Do you need assistance with eating?: No Do you have difficulty maintaining continence: No Do you need assistance with getting out of bed/getting out of a chair/moving?: No Do you have difficulty managing or taking your medications?: No  Follow up appointments reviewed: PCP Follow-up appointment confirmed?: NA (PCP is aware deferred to Surgeon) Specialist Hospital Follow-up appointment confirmed?: Yes Date of Specialist follow-up appointment?: 07/06/23 Follow-Up Specialty Provider:: Surgical Follow-up 12/20 [facility] for T possible drain removal Do you need transportation to your follow-up appointment?: No (He  drives) Do you understand care options if your condition(s) worsen?: Yes-patient verbalized understanding  SDOH Interventions Today    Flowsheet Row Most Recent Value  SDOH Interventions   Food Insecurity Interventions Intervention Not Indicated  Housing Interventions Intervention Not Indicated  Transportation Interventions Intervention Not Indicated  Utilities Interventions Intervention Not Indicated      Discussed [facility]  TOC program and weekly calls to patient to assess condition/status, medication management  and provide support/education as indicated . Patient/ Caregiver voiced understanding and declined enrollment in the 30-day TOC Program.      The patient has been provided with contact information for the care management team and has been advised to call with any health related questions or concerns.    Susa Loffler , BSN, RN Care Management Coordinator [place]   [facility] [email or web address] Direct Dial: [phone]

## 2023-06-24 LAB — AEROBIC/ANAEROBIC CULTURE W GRAM STAIN (SURGICAL/DEEP WOUND)

## 2023-06-27 ENCOUNTER — Emergency Department (HOSPITAL_BASED_OUTPATIENT_CLINIC_OR_DEPARTMENT_OTHER): Payer: BC Managed Care – PPO | Admitting: Radiology

## 2023-06-27 ENCOUNTER — Emergency Department (HOSPITAL_BASED_OUTPATIENT_CLINIC_OR_DEPARTMENT_OTHER)
Admission: EM | Admit: 2023-06-27 | Discharge: 2023-06-27 | Disposition: A | Discharge status: Home / Self Care | Payer: BC Managed Care – PPO | Attending: Emergency Medicine | Admitting: Emergency Medicine

## 2023-06-27 ENCOUNTER — Encounter (HOSPITAL_BASED_OUTPATIENT_CLINIC_OR_DEPARTMENT_OTHER): Payer: Self-pay | Admitting: Emergency Medicine

## 2023-06-27 ENCOUNTER — Other Ambulatory Visit: Payer: Self-pay

## 2023-06-27 ENCOUNTER — Emergency Department (HOSPITAL_BASED_OUTPATIENT_CLINIC_OR_DEPARTMENT_OTHER): Payer: BC Managed Care – PPO

## 2023-06-27 DIAGNOSIS — I2510 Atherosclerotic heart disease of native coronary artery without angina pectoris: Secondary | ICD-10-CM | POA: Diagnosis not present

## 2023-06-27 DIAGNOSIS — I110 Hypertensive heart disease with heart failure: Secondary | ICD-10-CM | POA: Insufficient documentation

## 2023-06-27 DIAGNOSIS — Z955 Presence of coronary angioplasty implant and graft: Secondary | ICD-10-CM | POA: Insufficient documentation

## 2023-06-27 DIAGNOSIS — I5032 Chronic diastolic (congestive) heart failure: Secondary | ICD-10-CM | POA: Diagnosis not present

## 2023-06-27 DIAGNOSIS — K567 Ileus, unspecified: Secondary | ICD-10-CM | POA: Diagnosis not present

## 2023-06-27 DIAGNOSIS — Z794 Long term (current) use of insulin: Secondary | ICD-10-CM | POA: Diagnosis not present

## 2023-06-27 DIAGNOSIS — Z7982 Long term (current) use of aspirin: Secondary | ICD-10-CM | POA: Diagnosis not present

## 2023-06-27 DIAGNOSIS — E119 Type 2 diabetes mellitus without complications: Secondary | ICD-10-CM | POA: Insufficient documentation

## 2023-06-27 DIAGNOSIS — Z7984 Long term (current) use of oral hypoglycemic drugs: Secondary | ICD-10-CM | POA: Diagnosis not present

## 2023-06-27 DIAGNOSIS — Z79899 Other long term (current) drug therapy: Secondary | ICD-10-CM | POA: Insufficient documentation

## 2023-06-27 DIAGNOSIS — R1013 Epigastric pain: Secondary | ICD-10-CM

## 2023-06-27 DIAGNOSIS — R109 Unspecified abdominal pain: Secondary | ICD-10-CM | POA: Diagnosis present

## 2023-06-27 LAB — URINALYSIS, W/ REFLEX TO CULTURE (INFECTION SUSPECTED)
Bacteria, UA: NONE SEEN
Bilirubin Urine: NEGATIVE
Glucose, UA: >1000 mg/dL — AB
Hgb urine dipstick: NEGATIVE
Ketones, ur: 15 mg/dL — AB
Leukocytes,Ua: NEGATIVE
Nitrite: NEGATIVE
Specific Gravity, Urine: >1.046 — ABNORMAL HIGH (ref 1.005–1.030)
pH: 7 (ref 5.0–8.0)

## 2023-06-27 LAB — CBC WITH DIFFERENTIAL/PLATELET
Abs Immature Granulocytes: 0.03 10*3/uL (ref 0.00–0.07)
Basophils Absolute: 0.1 10*3/uL (ref 0.0–0.1)
Basophils Relative: 1 %
Eosinophils Absolute: 0.4 10*3/uL (ref 0.0–0.5)
Eosinophils Relative: 4 %
HCT: 44.4 % (ref 39.0–52.0)
Hemoglobin: 14.5 g/dL (ref 13.0–17.0)
Immature Granulocytes: 0 %
Lymphocytes Relative: 39 %
Lymphs Abs: 3.9 10*3/uL (ref 0.7–4.0)
MCH: 28.4 pg (ref 26.0–34.0)
MCHC: 32.7 g/dL (ref 30.0–36.0)
MCV: 86.9 fL (ref 80.0–100.0)
Monocytes Absolute: 0.4 10*3/uL (ref 0.1–1.0)
Monocytes Relative: 4 %
Neutro Abs: 5.2 10*3/uL (ref 1.7–7.7)
Neutrophils Relative %: 52 %
Platelets: 466 10*3/uL — ABNORMAL HIGH (ref 150–400)
RBC: 5.11 MIL/uL (ref 4.22–5.81)
RDW: 13.5 % (ref 11.5–15.5)
WBC: 10.1 10*3/uL (ref 4.0–10.5)
nRBC: 0 % (ref 0.0–0.2)

## 2023-06-27 LAB — LACTIC ACID, PLASMA: Lactic Acid, Venous: 3.3 mmol/L (ref 0.5–1.9)

## 2023-06-27 LAB — COMPREHENSIVE METABOLIC PANEL
ALT: 11 U/L (ref 0–44)
AST: 14 U/L — ABNORMAL LOW (ref 15–41)
Albumin: 4.1 g/dL (ref 3.5–5.0)
Alkaline Phosphatase: 87 U/L (ref 38–126)
Anion gap: 12 (ref 5–15)
BUN: 16 mg/dL (ref 8–23)
CO2: 27 mmol/L (ref 22–32)
Calcium: 9.7 mg/dL (ref 8.9–10.3)
Chloride: 98 mmol/L (ref 98–111)
Creatinine, Ser: 0.83 mg/dL (ref 0.61–1.24)
GFR, Estimated: >60 mL/min (ref >60)
Glucose, Bld: 177 mg/dL — ABNORMAL HIGH (ref 70–99)
Potassium: 4.3 mmol/L (ref 3.5–5.1)
Sodium: 137 mmol/L (ref 135–145)
Total Bilirubin: 0.4 mg/dL (ref <1.2)
Total Protein: 7.7 g/dL (ref 6.5–8.1)

## 2023-06-27 LAB — CBG MONITORING, ED: Glucose-Capillary: 181 mg/dL — ABNORMAL HIGH (ref 70–99)

## 2023-06-27 LAB — PROTIME-INR
INR: 0.9 (ref 0.8–1.2)
Prothrombin Time: 12.7 s (ref 11.4–15.2)

## 2023-06-27 LAB — LIPASE, BLOOD: Lipase: 23 U/L (ref 11–51)

## 2023-06-27 MED ORDER — ONDANSETRON HCL 4 MG/2ML IJ SOLN
4.0000 mg | Freq: Once | INTRAMUSCULAR | Status: AC
  Administered 2023-06-27: 4 mg via INTRAVENOUS
  Filled 2023-06-27: qty 2

## 2023-06-27 MED ORDER — IOHEXOL 300 MG/ML  SOLN
100.0000 mL | Freq: Once | INTRAMUSCULAR | Status: AC | PRN
  Administered 2023-06-27: 100 mL via INTRAVENOUS

## 2023-06-27 NOTE — ED Provider Notes (Signed)
[facility] EMERGENCY DEPARTMENT AT [facility] Provider Note   CSN: [id number] Arrival date & time: 06/27/23  1641     History  Chief Complaint  Patient presents with   Abdominal Pain    [name] is a 62 y.o. male.   Abdominal Pain Patient with recent cholecystectomy with abscess.  Currently has percutaneous drain.  Today developed upper abdominal pain.  States it felt more full.  States he had a bowel movement that was initially normal then became more diarrhea.  Has not had a bowel movement since.  Not on pain medicines.  No fevers.  Drainage through the drain has been about 5 to 10 cc/day and is unchanged.    Past Medical History:  Diagnosis Date   Allergic rhinitis, cause unspecified    Anxiety    Chronic diastolic CHF (congestive heart failure) (HCC)    a. 03/2017 Echo: EF 55%, Gr1DD, nl RV fxn.   Degeneration of intervertebral disc, site unspecified    Diabetes mellitus    Type II   Hyperlipidemia    Hypertension    Impotence of organic origin    Joint pain    Nonobstructive CAD    a. 03/2014 Cath: LM nl, LAD nl, D1 30ost/p, LCX nl, RCA 30d, EF 60%; b. 03/2017 MV: EF 56%, small, moderate basal inf/mid inf defect->prob inf thinning vs mild ischemia-->low-risk; c. 08/2021 Cath: LM nl, LAD 20ost/p, LCX large, 19m, RCA large, 30d, EF 50-55%. Nl filling pressures-->Med rx.   Obesity    Obesity, unspecified    Obstructive sleep apnea (adult) (pediatric)    Other chronic allergic conjunctivitis    Personal history of colonic polyps    SOB (shortness of breath)    Past Surgical History:  Procedure Laterality Date   admission  01/2009   [facility].  Syncopal event with nausea, diaphoresis, hypertension.  D-dimer elevated at 2.54 with negative CT chest. and VQ scan.  Cardiac enzymes negative x 3.  Labs normal.  Cardiology consult---cardiolite abn; cath, 2D-echo, holter monitor unrevealing.  SE H&V.   Allergy Consult  07/11/2010   SOB, itching, facial tingling,  hypotension.  Allergy testing: no food allergies; +reaction to tree pollen, grass pollen, dust mites, mold.  Avoid diclofenac and similar products.  [name].   APPENDECTOMY     arthroscopy of knee  1980   BACK SURGERY     x3   CATARACT EXTRACTION Left    CHOLECYSTECTOMY N/A 05/15/2023   Procedure: LAPAROSCOPIC CHOLECYSTECTOMY;  Surgeon: Violeta Gelinas, MD;  Location: [facility] OR;  Service: General;  Laterality: N/A;   COLONOSCOPY  07/11/2009   single polyp; repeat in 5 years.  [name].   GI consult  07/11/2010   CT chest in ED (gastric thickening).  [name].  No EGD warranted due to lack of symptoms.   LAPAROSCOPIC GASTRIC BANDING  09/15/09   LEFT HEART CATHETERIZATION WITH CORONARY ANGIOGRAM N/A 04/02/2014   Procedure: LEFT HEART CATHETERIZATION WITH CORONARY ANGIOGRAM;  Surgeon: Lesleigh Noe, MD;  Location: [facility] CATH LAB;  Service: Cardiovascular;  Laterality: N/A;   QUADRICEPS REPAIR     left   QUADRICEPS TENDON REPAIR     left   RETINAL DETACHMENT SURGERY     RIGHT/LEFT HEART CATH AND CORONARY ANGIOGRAPHY N/A 08/13/2021   Procedure: RIGHT/LEFT HEART CATH AND CORONARY ANGIOGRAPHY;  Surgeon: Kathleene Hazel, MD;  Location: [facility] INVASIVE CV LAB;  Service: Cardiovascular;  Laterality: N/A;   ROTATOR CUFF REPAIR     right  Sleep study  07/11/2010   repeat sleep study due to weight loss.  Severe OSA; CPAP titration to 8 cm.     SPINE SURGERY     TONSILLECTOMY AND ADENOIDECTOMY  1960    Home Medications Prior to Admission medications   Medication Sig Start Date End Date Taking? Authorizing Provider  acetaminophen (TYLENOL) 500 MG tablet Take 1,000 mg by mouth once as needed for moderate pain (pain score 4-6).    [provider]  albuterol (PROVENTIL) (2.5 MG/3ML) 0.083% nebulizer solution Take 3 mLs (2.5 mg total) by nebulization every 6 (six) hours as needed for wheezing or shortness of breath. 06/13/23   Eden Emms, NP  albuterol (VENTOLIN HFA) 108 (90 Base) MCG/ACT inhaler Inhale  1-2 puffs into the lungs every 6 (six) hours as needed for wheezing or shortness of breath. 10/11/22   Oretha Milch, MD  aspirin EC 81 MG tablet Take 81 mg by mouth daily.    [provider]  Continuous Glucose Sensor (DEXCOM G7 SENSOR) MISC See admin instructions.    [provider]  cyclobenzaprine (FLEXERIL) 5 MG tablet TAKE ONE TABLET BY MOUTH EVERY NIGHT AT BEDTIME 03/10/23   Joaquim Nam, MD  Dulaglutide (TRULICITY) 4.5 MG/0.5ML SOPN Inject 4.5 mg into the skin once a week. Saturday or Sunday- Last injection was 05/06/2023.    [provider]  Evolocumab (REPATHA SURECLICK) 140 MG/ML SOAJ Inject 140 mg into the skin every 14 (fourteen) days. 05/10/23   Perlie Gold, PA-C  ezetimibe (ZETIA) 10 MG tablet Take 1 tablet (10 mg total) by mouth daily. 02/14/22   Kathleene Hazel, MD  FLUoxetine (PROZAC) 20 MG tablet TAKE ONE TABLET BY MOUTH ONCE A DAY 01/24/23   Joaquim Nam, MD  gabapentin (NEURONTIN) 100 MG capsule TAKE ONE TO THREE CAPSULES BY MOUTH THREE TIMES DAILY AS NEEDED 06/07/23   Joaquim Nam, MD  insulin aspart (NOVOLOG FLEXPEN) 100 UNIT/ML FlexPen Per sliding scale 02/26/21   Joaquim Nam, MD  insulin degludec (TRESIBA) 200 UNIT/ML FlexTouch Pen Inject 32 Units into the skin every evening. 10/14/21   Joaquim Nam, MD  JARDIANCE 25 MG TABS tablet TAKE 1 TABLET BY MOUTH ONCE A DAY 11/23/20   Carlus Pavlov, MD  metFORMIN (GLUCOPHAGE) 1000 MG tablet TAKE 1 TABLET (1,000 MG TOTAL) BY MOUTH 2 (TWO) TIMES DAILY WITH A MEAL. 10/28/19   Carlus Pavlov, MD  Multiple Vitamin (MULTIVITAMIN) capsule Take 1 capsule by mouth daily.    [provider]  nebivolol (BYSTOLIC) 5 MG tablet TAKE ONE TABLET (5 MG TOTAL) BY MOUTH DAILY. PLEASE KEEP SCHEDULED APPOINTMENT FOR FUTURE REFILLS. THANK YOU. 05/11/23   Kathleene Hazel, MD  nitroGLYCERIN (NITROSTAT) 0.4 MG SL tablet Place 1 tablet (0.4 mg total) under the tongue every 5 (five) minutes  as needed for chest pain. 08/06/21 05/15/23  Creig Hines, NP  omeprazole (PRILOSEC) 20 MG capsule Take 20 mg by mouth daily. 05/09/23   [provider]  ondansetron (ZOFRAN) 4 MG tablet Take 1 tablet (4 mg total) by mouth 4 (four) times daily as needed for nausea. 05/19/23   Joaquim Nam, MD  Vitamin D, Ergocalciferol, (DRISDOL) 1.25 MG (50000 UNIT) CAPS capsule Take 1 capsule (50,000 Units total) by mouth every 7 (seven) days. 04/25/22   Worthy Rancher, MD      Allergies    Diclofenac sodium, Statins, Voltaren [diclofenac sodium], Lipitor [atorvastatin], Metoprolol, and Ramipril    Review of Systems  Review of Systems  Gastrointestinal:  Positive for abdominal pain.    Physical Exam Updated Vital Signs BP 130/82   Pulse 97   Temp 98.6 F (37 C) (Oral)   Resp 16   Ht 6\' 1"  (1.854 m)   Wt 127.5 kg   SpO2 100%   BMI 37.07 kg/m  Physical Exam Vitals reviewed.  HENT:     Head: Normocephalic.  Cardiovascular:     Rate and Rhythm: Regular rhythm.  Abdominal:     Hernia: No hernia is present.     Comments: Percutaneous drain in right upper abdomen.  Does have some dark drainage.  No purulent drainage.  Minimal upper abdominal tenderness.  No rebound or guarding.  Neurological:     Mental Status: He is alert.     ED Results / Procedures / Treatments   Labs (all labs ordered are listed, but only abnormal results are displayed) Labs Reviewed  COMPREHENSIVE METABOLIC PANEL - Abnormal; Notable for the following components:      Result Value   Glucose, Bld 177 (*)    AST 14 (*)    All other components within normal limits  LACTIC ACID, PLASMA - Abnormal; Notable for the following components:   Lactic Acid, Venous 3.3 (*)    All other components within normal limits  CBC WITH DIFFERENTIAL/PLATELET - Abnormal; Notable for the following components:   Platelets 466 (*)    All other components within normal limits  URINALYSIS, W/ REFLEX TO CULTURE  (INFECTION SUSPECTED) - Abnormal; Notable for the following components:   Specific Gravity, Urine >1.046 (*)    Glucose, UA >1,000 (*)    Ketones, ur 15 (*)    Protein, ur TRACE (*)    All other components within normal limits  CBG MONITORING, ED - Abnormal; Notable for the following components:   Glucose-Capillary 181 (*)    All other components within normal limits  CULTURE, BLOOD (ROUTINE X 2)  CULTURE, BLOOD (ROUTINE X 2)  PROTIME-INR  LIPASE, BLOOD  LACTIC ACID, PLASMA    EKG None  Radiology DG Chest 2 View Result Date: 06/27/2023 CLINICAL DATA:  Suspected sepsis EXAM: CHEST - 2 VIEW COMPARISON:  06/19/2023 FINDINGS: The heart size and mediastinal contours are within normal limits. Both lungs are clear. The visualized skeletal structures are unremarkable. Interval right upper quadrant drainage catheter. IMPRESSION: No active cardiopulmonary disease. Electronically Signed   By: Jasmine Pang M.D.   On: 06/27/2023 20:36   CT ABDOMEN PELVIS W CONTRAST Result Date: 06/27/2023 CLINICAL DATA:  Recent gallbladder removal developed abscess has drain nausea EXAM: CT ABDOMEN AND PELVIS WITH CONTRAST TECHNIQUE: Multidetector CT imaging of the abdomen and pelvis was performed using the standard protocol following bolus administration of intravenous contrast. RADIATION DOSE REDUCTION: This exam was performed according to the departmental dose-optimization program which includes automated exposure control, adjustment of the mA and/or kV according to patient size and/or use of iterative reconstruction technique. CONTRAST:  OMNIPAQUE IOHEXOL 300 MG/ML  SOLN COMPARISON:  CT 06/19/2023 FINDINGS: Lower chest: Lung bases demonstrate no acute airspace disease. Hepatobiliary: Status post cholecystectomy. Interval placement of right upper quadrant percutaneous drainage catheter with evacuation of previously noted air and fluid collection at the gallbladder fossa. No residual measurable collection on  today's examination. No biliary dilatation. Pancreas: Unremarkable. No pancreatic ductal dilatation or surrounding inflammatory changes. Atrophic Spleen: Normal in size without focal abnormality. Adrenals/Urinary Tract: Adrenal glands are within normal limits. Kidneys show no hydronephrosis. The bladder is unremarkable.  Stomach/Bowel: Gastric band is in place. Moderate fluid distension of the stomach. Diffuse fluid-filled small bowel with slight dilatation but no well-defined transition point. Some fluid in the colon. No acute bowel wall thickening. Appendectomy. Vascular/Lymphatic: Nonaneurysmal aorta.  No suspicious lymph nodes Reproductive: Prostate is unremarkable. Other: Negative for pelvic effusion or free air. Some soft tissue thickening and edema at the umbilicus which may relate to resolving postsurgical change. Musculoskeletal: No acute or suspicious osseous abnormality. Posterior lumbar fusion hardware L4 through S1. IMPRESSION: 1. Interval placement of right upper quadrant percutaneous drainage catheter with evacuation of previously noted air and fluid collection at the gallbladder fossa. No residual measurable collection on today's examination. 2. Diffuse fluid-filled small bowel with slight dilatation but no well-defined transition point, findings are favored to represent ileus over partial obstruction. Moderate to marked fluid distension of stomach with gastric band in place. Electronically Signed   By: Jasmine Pang M.D.   On: 06/27/2023 20:35    Procedures Procedures    Medications Ordered in ED Medications  iohexol (OMNIPAQUE) 300 MG/ML solution 100 mL (100 mLs Intravenous Contrast Given 06/27/23 1844)  ondansetron (ZOFRAN) injection 4 mg (4 mg Intravenous Given 06/27/23 2051)    ED Course/ Medical Decision Making/ A&P                                 Medical Decision Making Amount and/or Complexity of Data Reviewed Labs: ordered. Radiology: ordered.  Risk Prescription drug  management.   Patient abdominal pain.  Recent complicated cholecystitis.  Had abscess after removal.  Now increased pain.  Benign abdominal exam.  White count reassuring.  LFTs reassuring.  CT scan done and showed resolution of the fluid collection.  Does have potentially some fluid on his stomach from his gastric banding.  Also has potential ileus.  Obstruction felt less likely.  Benign abdominal exam.  Patient has been able to tolerate orals.  I think he is reasonable for discharge home.  Has short-term follow-up with IR and general surgery but can return if needed.  Will discharge.        Final Clinical Impression(s) / ED Diagnoses Final diagnoses:  Epigastric pain  Ileus [facility])    Rx / DC Orders ED Discharge Orders     None         Benjiman Core, MD 06/27/23 2056

## 2023-06-27 NOTE — Discharge Instructions (Signed)
It looks like the fluid collection has decreased in size.  You may have an ileus.  Easier oral intake up gently.  Follow-up with your doctors as planned.  Return for worsening symptoms.

## 2023-06-28 NOTE — Progress Notes (Signed)
Referring Physician(s): Dr. Violeta Gelinas   Chief Complaint: The patient is seen in follow up today s/p gallbladder fossa abscess drain placed 06/19/23  History of present illness: [name] is a 62 year old male with a history of cholecystitis with empyema of the gallbladder s/p laparoscopic cholecystectomy 05/15/23. He was discharged home from the hospital 05/17/23 and was doing well until early December when he developed general malaise, nausea and vomiting. He was evaluated in the ED 06/19/23 and found to have an air/fluid collection in the gallbladder fossa. IR was consulted by Surgery and the patient underwent aspiration with 12 Fr drain placement 06/19/23. Approximately 60 ml of purulent fluid was aspirated with cultures positive for E. Coli.    He was discharged home on oral antibiotics 06/20/23. He presents today for a drain evaluation with CT imaging and possible drain injection under fluoroscopy.  He has had 5-15 mL fluid in bulb over past week. No fevers, chills, abdominal pain.  No problems with drain.  Past Medical History:  Diagnosis Date   Allergic rhinitis, cause unspecified    Anxiety    Chronic diastolic CHF (congestive heart failure) (HCC)    a. 03/2017 Echo: EF 55%, Gr1DD, nl RV fxn.   Degeneration of intervertebral disc, site unspecified    Diabetes mellitus    Type II   Hyperlipidemia    Hypertension    Impotence of organic origin    Joint pain    Nonobstructive CAD    a. 03/2014 Cath: LM nl, LAD nl, D1 30ost/p, LCX nl, RCA 30d, EF 60%; b. 03/2017 MV: EF 56%, small, moderate basal inf/mid inf defect->prob inf thinning vs mild ischemia-->low-risk; c. 08/2021 Cath: LM nl, LAD 20ost/p, LCX large, 61m, RCA large, 30d, EF 50-55%. Nl filling pressures-->Med rx.   Obesity    Obesity, unspecified    Obstructive sleep apnea (adult) (pediatric)    Other chronic allergic conjunctivitis    Personal history of colonic polyps    SOB (shortness of breath)     Past Surgical  History:  Procedure Laterality Date   admission  01/2009   [facility].  Syncopal event with nausea, diaphoresis, hypertension.  D-dimer elevated at 2.54 with negative CT chest. and VQ scan.  Cardiac enzymes negative x 3.  Labs normal.  Cardiology consult---cardiolite abn; cath, 2D-echo, holter monitor unrevealing.  SE H&V.   Allergy Consult  07/11/2010   SOB, itching, facial tingling, hypotension.  Allergy testing: no food allergies; +reaction to tree pollen, grass pollen, dust mites, mold.  Avoid diclofenac and similar products.  [name].   APPENDECTOMY     arthroscopy of knee  1980   BACK SURGERY     x3   CATARACT EXTRACTION Left    CHOLECYSTECTOMY N/A 05/15/2023   Procedure: LAPAROSCOPIC CHOLECYSTECTOMY;  Surgeon: Violeta Gelinas, MD;  Location[facility] OR;  Service: General;  Laterality: N/A;   COLONOSCOPY  07/11/2009   single polyp; repeat in 5 years.  [name].   GI consult  07/11/2010   CT chest in ED (gastric thickening).  [name].  No EGD warranted due to lack of symptoms.   LAPAROSCOPIC GASTRIC BANDING  09/15/09   LEFT HEART CATHETERIZATION WITH CORONARY ANGIOGRAM N/A 04/02/2014   Procedure: LEFT HEART CATHETERIZATION WITH CORONARY ANGIOGRAM;  Surgeon: Lesleigh Noe, MD;  Location: [facility] CATH LAB;  Service: Cardiovascular;  Laterality: N/A;   QUADRICEPS REPAIR     left   QUADRICEPS TENDON REPAIR     left   RETINAL DETACHMENT SURGERY  RIGHT/LEFT HEART CATH AND CORONARY ANGIOGRAPHY N/A 08/13/2021   Procedure: RIGHT/LEFT HEART CATH AND CORONARY ANGIOGRAPHY;  Surgeon: Kathleene Hazel, MD;  Location: [facility] INVASIVE CV LAB;  Service: Cardiovascular;  Laterality: N/A;   ROTATOR CUFF REPAIR     right   Sleep study  07/11/2010   repeat sleep study due to weight loss.  Severe OSA; CPAP titration to 8 cm.     SPINE SURGERY     TONSILLECTOMY AND ADENOIDECTOMY  1960    Allergies: Diclofenac sodium, Statins, Voltaren [diclofenac sodium], Lipitor [atorvastatin], Metoprolol, and  Ramipril  Medications: Prior to Admission medications   Medication Sig Start Date End Date Taking? Authorizing Provider  acetaminophen (TYLENOL) 500 MG tablet Take 1,000 mg by mouth once as needed for moderate pain (pain score 4-6).    [provider]  albuterol (PROVENTIL) (2.5 MG/3ML) 0.083% nebulizer solution Take 3 mLs (2.5 mg total) by nebulization every 6 (six) hours as needed for wheezing or shortness of breath. 06/13/23   Eden Emms, NP  albuterol (VENTOLIN HFA) 108 (90 Base) MCG/ACT inhaler Inhale 1-2 puffs into the lungs every 6 (six) hours as needed for wheezing or shortness of breath. 10/11/22   Oretha Milch, MD  aspirin EC 81 MG tablet Take 81 mg by mouth daily.    [provider]  Continuous Glucose Sensor (DEXCOM G7 SENSOR) MISC See admin instructions.    [provider]  cyclobenzaprine (FLEXERIL) 5 MG tablet TAKE ONE TABLET BY MOUTH EVERY NIGHT AT BEDTIME 03/10/23   Joaquim Nam, MD  Dulaglutide (TRULICITY) 4.5 MG/0.5ML SOPN Inject 4.5 mg into the skin once a week. Saturday or Sunday- Last injection was 05/06/2023.    [provider]  Evolocumab (REPATHA SURECLICK) 140 MG/ML SOAJ Inject 140 mg into the skin every 14 (fourteen) days. 05/10/23   Perlie Gold, PA-C  ezetimibe (ZETIA) 10 MG tablet Take 1 tablet (10 mg total) by mouth daily. 02/14/22   Kathleene Hazel, MD  FLUoxetine (PROZAC) 20 MG tablet TAKE ONE TABLET BY MOUTH ONCE A DAY 01/24/23   Joaquim Nam, MD  gabapentin (NEURONTIN) 100 MG capsule TAKE ONE TO THREE CAPSULES BY MOUTH THREE TIMES DAILY AS NEEDED 06/07/23   Joaquim Nam, MD  insulin aspart (NOVOLOG FLEXPEN) 100 UNIT/ML FlexPen Per sliding scale 02/26/21   Joaquim Nam, MD  insulin degludec (TRESIBA) 200 UNIT/ML FlexTouch Pen Inject 32 Units into the skin every evening. 10/14/21   Joaquim Nam, MD  JARDIANCE 25 MG TABS tablet TAKE 1 TABLET BY MOUTH ONCE A DAY 11/23/20   Carlus Pavlov, MD  metFORMIN  (GLUCOPHAGE) 1000 MG tablet TAKE 1 TABLET (1,000 MG TOTAL) BY MOUTH 2 (TWO) TIMES DAILY WITH A MEAL. 10/28/19   Carlus Pavlov, MD  Multiple Vitamin (MULTIVITAMIN) capsule Take 1 capsule by mouth daily.    [provider]  nebivolol (BYSTOLIC) 5 MG tablet TAKE ONE TABLET (5 MG TOTAL) BY MOUTH DAILY. PLEASE KEEP SCHEDULED APPOINTMENT FOR FUTURE REFILLS. THANK YOU. 05/11/23   Kathleene Hazel, MD  nitroGLYCERIN (NITROSTAT) 0.4 MG SL tablet Place 1 tablet (0.4 mg total) under the tongue every 5 (five) minutes as needed for chest pain. 08/06/21 05/15/23  Creig Hines, NP  omeprazole (PRILOSEC) 20 MG capsule Take 20 mg by mouth daily. 05/09/23   [provider]  ondansetron (ZOFRAN) 4 MG tablet Take 1 tablet (4 mg total) by mouth 4 (four) times daily as needed for nausea. 05/19/23   Para March,  Dwana Curd, MD  Vitamin D, Ergocalciferol, (DRISDOL) 1.25 MG (50000 UNIT) CAPS capsule Take 1 capsule (50,000 Units total) by mouth every 7 (seven) days. 04/25/22   Worthy Rancher, MD     Family History  Problem Relation Age of Onset   Cancer Father        skin   Heart disease Father 45       AMI first age 5; stents.   Diabetes Father    Hypertension Father    Hyperlipidemia Father    Peripheral Artery Disease Father    Parkinson's disease Father    Cancer Paternal Grandfather        skin   Diabetes Mother    Arthritis Mother        total hip replacement   Kidney failure Mother    Depression Sister    Diabetes Sister    Cancer Maternal Uncle        lymphoma   Prostate cancer Maternal Uncle    Colon cancer Neg Hx     Social History   Socioeconomic History   Marital status: Married    Spouse name: Marchelle Folks   Number of children: 3   Years of education: Not on file   Highest education level: 12th grade  Occupational History   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    Occupation: Retired  Tobacco Use   Smoking status: Former    Current packs/day: 0.25    Average packs/day: 0.3 packs/day for 2.0 years (0.5 ttl pk-yrs)    Types: Cigarettes   Smokeless tobacco: Never  Vaping Use   Vaping status: Never Used  Substance and Sexual Activity   Alcohol use: Yes    Alcohol/week: 0.0 standard drinks of alcohol    Comment: occasional Beer once every two weeks on the weekends   Drug use: No   Sexual activity: Yes  Other Topics Concern   Not on file  Social History Narrative   Married 1986       Children: 3 children, 3 grandchildren; 2 great grandkids      Lives: with wife, dog.      Employment: employed.      Tobacco: never      Alcohol: beer twice per month on weekends.      Drugs: none      Exercise: walking three days per week.      Seatbelt: 100% of time      Guns: loaded and secured/locked.      Sunscreen: yes.   Social Drivers of Corporate investment banker Strain: Low Risk  (05/18/2023)   Overall Financial Resource Strain (CARDIA)    Difficulty of Paying Living Expenses: Not very hard  Food Insecurity: No Food Insecurity (06/23/2023)   Hunger Vital Sign    Worried About Running Out of Food in the Last Year: Never true    Ran Out of Food in the Last Year: Never true  Transportation Needs: No Transportation Needs (06/23/2023)   PRAPARE - Administrator, Civil Service (Medical): No    Lack of Transportation (Non-Medical): No  Physical Activity: Sufficiently Active (05/18/2023)   Exercise Vital Sign    Days of Exercise per Week: 7 days    Minutes of Exercise per Session: 30 min  Stress: Not on file  Social Connections: Socially Integrated (05/18/2023)   Social Connection and Isolation Panel [NHANES]    Frequency  of Communication with Friends and Family: Three times a week    Frequency of Social Gatherings with Friends and Family: Once a week    Attends Religious Services: More than 4 times per year    Active Member of [employer] or  Organizations: Yes    Attends Engineer, structural: More than 4 times per year    Marital Status: Married     Vital Signs: There were no vitals taken for this visit.  Physical Exam Constitutional:      General: He is not in acute distress. HENT:     Head: Normocephalic.     Mouth/Throat:     Mouth: Mucous membranes are moist.  Eyes:     General: No scleral icterus. Cardiovascular:     Rate and Rhythm: Normal rate.  Pulmonary:     Effort: Pulmonary effort is normal. No respiratory distress.  Abdominal:     General: There is no distension.     Comments: RUQ drain in place, trace serosanguinous fluid in bulb.  Skin:    General: Skin is warm and dry.     Coloration: Skin is not jaundiced.  Neurological:     Mental Status: He is alert and oriented to person, place, and time.     Imaging: CT AP 06/30/23   Drain injection 06/30/23   CBC: Recent Labs    06/15/23 1235 06/19/23 0803 06/20/23 0440 06/27/23 1705  WBC 13.6* 11.7* 8.0 10.1  HGB 13.9 12.9* 12.3* 14.5  HCT 41.6 38.8* 37.8* 44.4  PLT 565.0* 468* 400 466*    COAGS: Recent Labs    05/14/23 2250 06/27/23 1705  INR 1.1 0.9  APTT 31  --     BMP: Recent Labs    05/17/23 0442 06/06/23 0917 06/15/23 1235 06/19/23 0803 06/20/23 0440 06/27/23 1705  NA 133* 137  --  137 135 137  K 3.8 5.2 No hemolysis seen* 4.8 4.6 4.2 4.3  CL 100 98  --  99 100 98  CO2 22 31  --  27 27 27   GLUCOSE 145* 79  --  84 140* 177*  BUN 20 16  --  20 17 16   CALCIUM 8.3* 9.1  --  9.7 8.8* 9.7  CREATININE 1.19 0.84  --  1.05 0.98 0.83  GFRNONAA >60  --   --  >60 >60 >60    LIVER FUNCTION TESTS: Recent Labs    06/06/23 0917 06/19/23 0803 06/20/23 0440 06/27/23 1705  BILITOT 0.6 0.5 0.5 0.4  AST 11 10* 13* 14*  ALT 8 7 10 11   ALKPHOS 113 108 85 87  PROT 6.8 7.5 6.2* 7.7  ALBUMIN 3.5 3.7 2.5* 4.1    Assessment and Plan:  62 year old male with a history of empyema of the gallbladder s/p laparoscopic  cholecystectomy 05/15/23. His procedure was complicated by a post-op abscess in the gallbladder fossa and he was seen in IR 06/19/23 for drain placement. He has had trace output from the drain.  The collection has resolved and drain injection demonstrates no evidence of fistula.  The drain was removed.  Marliss Coots, MD Pager: [phone]    I spent a total of 25 Minutes in face to face in clinical consultation, greater than 50% of which was counseling/coordinating care for gallbladder fossa abscess drain.

## 2023-06-30 ENCOUNTER — Ambulatory Visit
Admission: RE | Admit: 2023-06-30 | Discharge: 2023-06-30 | Disposition: A | Discharge status: Home / Self Care | Payer: BC Managed Care – PPO | Source: Ambulatory Visit | Attending: General Surgery | Admitting: General Surgery

## 2023-06-30 ENCOUNTER — Ambulatory Visit (HOSPITAL_COMMUNITY): Payer: BC Managed Care – PPO

## 2023-06-30 ENCOUNTER — Ambulatory Visit
Admission: RE | Admit: 2023-06-30 | Discharge: 2023-06-30 | Disposition: A | Discharge status: Home / Self Care | Payer: BC Managed Care – PPO | Source: Ambulatory Visit | Attending: Radiology | Admitting: Radiology

## 2023-06-30 DIAGNOSIS — R188 Other ascites: Secondary | ICD-10-CM

## 2023-06-30 HISTORY — PX: IR RADIOLOGIST EVAL & MGMT: IMG5224

## 2023-06-30 MED ORDER — IOPAMIDOL (ISOVUE-300) INJECTION 61%
100.0000 mL | Freq: Once | INTRAVENOUS | Status: AC | PRN
  Administered 2023-06-30: 100 mL via INTRAVENOUS

## 2023-07-02 DIAGNOSIS — E875 Hyperkalemia: Secondary | ICD-10-CM | POA: Insufficient documentation

## 2023-07-02 LAB — CULTURE, BLOOD (ROUTINE X 2)
Culture: NO GROWTH
Culture: NO GROWTH
Special Requests: ADEQUATE
Special Requests: ADEQUATE

## 2023-07-02 NOTE — Assessment & Plan Note (Addendum)
Acute, concern for pneumonia treated witha current antibiotic.  Will broaden course of antibiotics to Levaquin. Reviewed expected course and possible side effects. Check CBC to rule out anemia and to reevaluate white blood cells.    Call if continued fever, new abdominal pain or shortness of breath.  If severe shortness of breath.. go to ER.

## 2023-07-02 NOTE — Assessment & Plan Note (Signed)
Due for reevaluation 

## 2023-07-07 ENCOUNTER — Other Ambulatory Visit: Payer: BC Managed Care – PPO

## 2023-07-10 ENCOUNTER — Other Ambulatory Visit: Payer: Self-pay | Admitting: Family Medicine

## 2023-07-26 ENCOUNTER — Other Ambulatory Visit: Payer: Self-pay | Admitting: Family Medicine

## 2023-08-23 LAB — LIPID PANEL
Chol/HDL Ratio: 1.9 {ratio} (ref 0.0–5.0)
Cholesterol, Total: 99 mg/dL — ABNORMAL LOW (ref 100–199)
HDL: 51 mg/dL (ref >39)
LDL Chol Calc (NIH): 35 mg/dL (ref 0–99)
Triglycerides: 58 mg/dL (ref 0–149)
VLDL Cholesterol Cal: 13 mg/dL (ref 5–40)

## 2023-08-24 ENCOUNTER — Encounter: Payer: Self-pay | Admitting: Pharmacist

## 2023-10-02 LAB — HEMOGLOBIN A1C: Hemoglobin A1C: 8.4

## 2023-10-26 IMAGING — DX DG CHEST 2V
2 series · 2 of 2 positions shown · non-contrast
Comparison: Chest radiograph 09/01/2014

CLINICAL DATA: Dyspnea on exertion

EXAM:
CHEST - 2 VIEW

[chest pa]
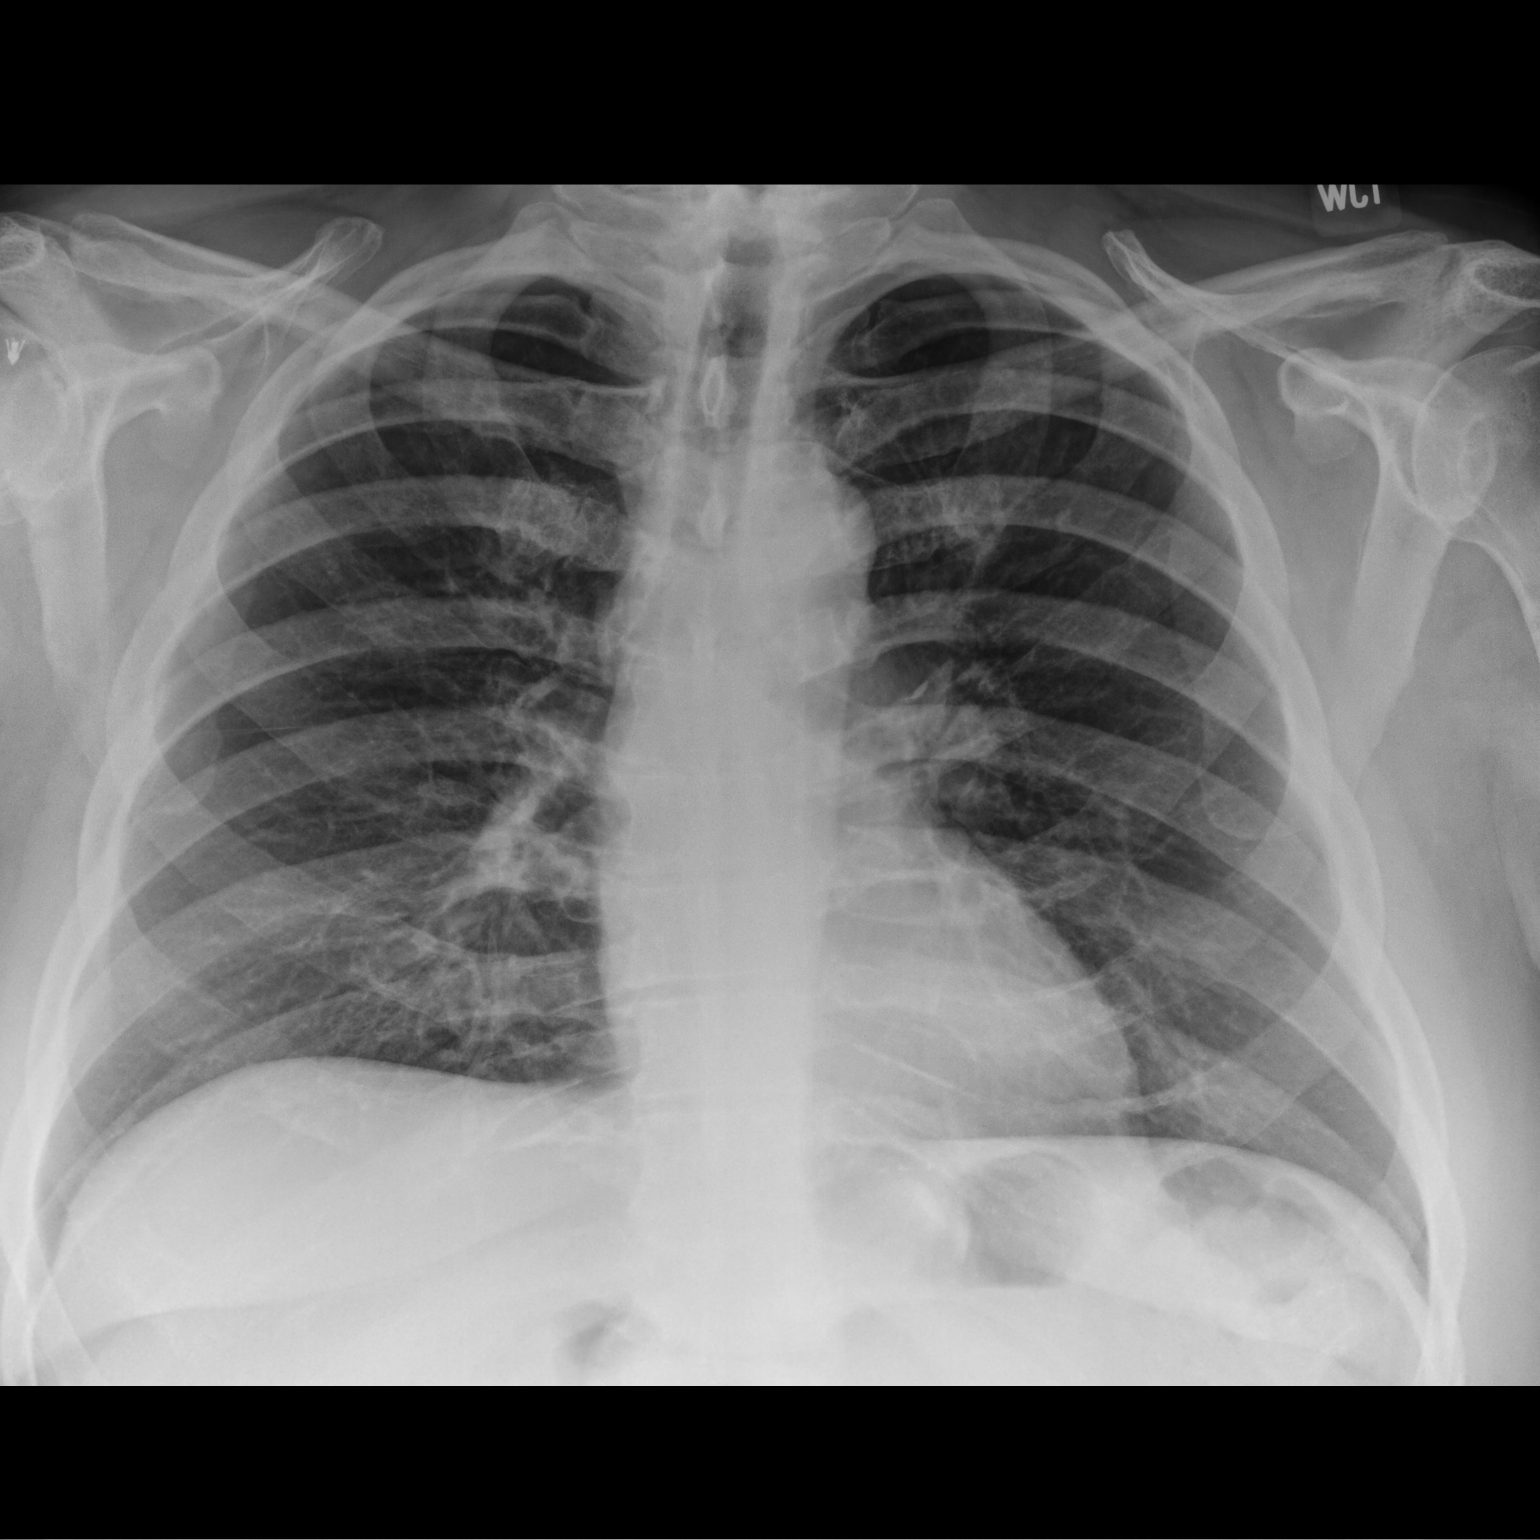

[chest lat]
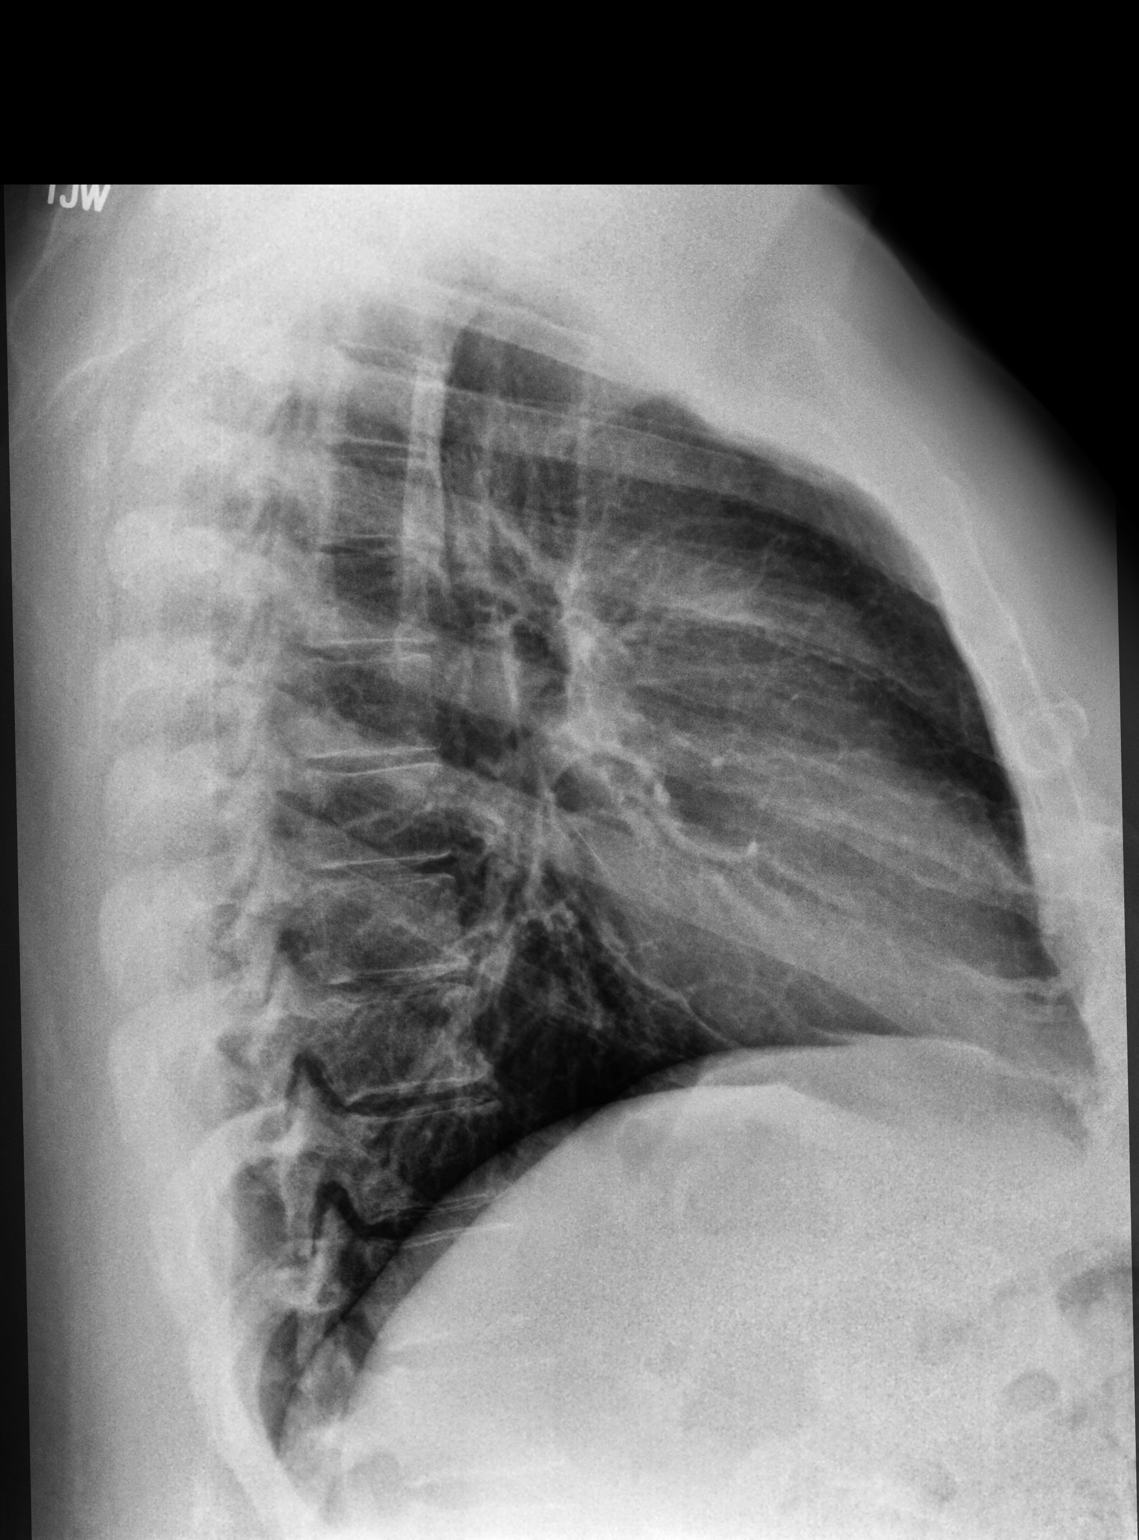

[2 of 2 positions shown; findings below may reference images not displayed]

FINDINGS: The cardiomediastinal silhouette is normal.

There is no focal consolidation or pulmonary edema. There is no
pleural effusion or pneumothorax

There is no acute osseous abnormality.
IMPRESSION: No radiographic evidence of acute cardiopulmonary process.

## 2023-11-07 ENCOUNTER — Other Ambulatory Visit: Payer: Self-pay | Admitting: Family Medicine

## 2023-12-13 ENCOUNTER — Encounter: Payer: Self-pay | Admitting: Family Medicine

## 2023-12-21 ENCOUNTER — Encounter: Payer: Self-pay | Admitting: Family Medicine

## 2023-12-21 ENCOUNTER — Other Ambulatory Visit: Payer: Self-pay | Admitting: Family Medicine

## 2023-12-22 ENCOUNTER — Other Ambulatory Visit: Payer: Self-pay | Admitting: Family Medicine

## 2023-12-22 MED ORDER — ACETAZOLAMIDE ER 500 MG PO CP12: 500.0000 mg | ORAL_CAPSULE | Freq: Every day | ORAL | Status: DC

## 2024-01-10 LAB — HM DIABETES EYE EXAM

## 2024-01-15 ENCOUNTER — Encounter: Payer: Self-pay | Admitting: Family Medicine

## 2024-01-15 ENCOUNTER — Ambulatory Visit (INDEPENDENT_AMBULATORY_CARE_PROVIDER_SITE_OTHER): Admitting: Family Medicine

## 2024-01-15 VITALS — BP 126/54 | HR 87 | Temp 98.6°F | Ht 72.0 in | Wt 269.5 lb

## 2024-01-15 DIAGNOSIS — Z0000 Encounter for general adult medical examination without abnormal findings: Secondary | ICD-10-CM

## 2024-01-15 DIAGNOSIS — Z7189 Other specified counseling: Secondary | ICD-10-CM

## 2024-01-15 DIAGNOSIS — I10 Essential (primary) hypertension: Secondary | ICD-10-CM

## 2024-01-15 DIAGNOSIS — E1169 Type 2 diabetes mellitus with other specified complication: Secondary | ICD-10-CM

## 2024-01-15 DIAGNOSIS — E785 Hyperlipidemia, unspecified: Secondary | ICD-10-CM

## 2024-01-15 DIAGNOSIS — Z125 Encounter for screening for malignant neoplasm of prostate: Secondary | ICD-10-CM | POA: Diagnosis not present

## 2024-01-15 DIAGNOSIS — Z794 Long term (current) use of insulin: Secondary | ICD-10-CM

## 2024-01-15 MED ORDER — INSULIN DEGLUDEC 200 UNIT/ML ~~LOC~~ SOPN
30.0000 [IU] | PEN_INJECTOR | Freq: Every evening | SUBCUTANEOUS | Status: AC
Start: 2024-01-15 — End: ?

## 2024-01-15 NOTE — Patient Instructions (Addendum)
 Go to the lab on the way out.   If you have mychart we'll likely use that to update you.    I would get a flu shot each fall.   Take care.  Glad to see you.  If your labs are fine, then I would see about taking a lower dose of bystolic .  I'll update cardiology.

## 2024-01-15 NOTE — Progress Notes (Unsigned)
 CPE- See plan.  Routine anticipatory guidance given to patient.  See health maintenance.  The possibility exists that previously documented standard health maintenance information may have been brought forward from a previous encounter into this note.  If needed, that same information has been updated to reflect the current situation based on today's encounter.    Tetanus 2022 Flu to be done yearly PNA 2016 Shingles d/w pt.   covid vaccine prev done.   Cologuard neg 2024 PSA pending 2025.  Living will d/w pt.  Wife designated if patient were incapacitated.   Elevated Cholesterol: Using medications without problems: yes Muscle aches: no Diet compliance: d/w pt.  Exercise: d/w pt.  Off zetia .  Prev lipids controlled.   Hypertension:    Using medication without problems or lightheadedness: he can get lightheaded Chest pain with exertion:no Edema:no Short of breath:yes, with med changes since he had elevated eye pressure.  Off acetazolamide  now.    DM2 per endo.  Last A1c 8.4 on 10/02/23.  14 day average based on dexcom is 7.7, 90 day average 7.3.  He is going to follow-up with endocrinology.  Wife is wheelchair bound.  Discussed.    He had eye clinic eval re: pressure.  Pressure has improved.    PMH and SH reviewed  Meds, vitals, and allergies reviewed.   ROS: Per HPI.  Unless specifically indicated otherwise in HPI, the patient denies:  General: fever. Eyes: acute vision changes ENT: sore throat Cardiovascular: chest pain Respiratory: SOB GI: vomiting GU: dysuria Musculoskeletal: acute back pain Derm: acute rash Neuro: acute motor dysfunction Psych: worsening mood Endocrine: polydipsia Heme: bleeding Allergy: hayfever  GEN: nad, alert and oriented HEENT: ncat NECK: supple w/o LA CV: rrr. PULM: ctab, no inc wob ABD: soft, +bs EXT: no edema SKIN: no acute rash  Diabetic foot exam: Normal inspection No skin breakdown No calluses  Normal DP pulses Normal  sensation to light touch but dec to L foot monofilament R 1st nail absent, other nails thickened.

## 2024-01-16 LAB — COMPREHENSIVE METABOLIC PANEL WITH GFR
ALT: 11 U/L (ref 0–53)
AST: 15 U/L (ref 0–37)
Albumin: 4.1 g/dL (ref 3.5–5.2)
Alkaline Phosphatase: 61 U/L (ref 39–117)
BUN: 14 mg/dL (ref 6–23)
CO2: 31 meq/L (ref 19–32)
Calcium: 9.1 mg/dL (ref 8.4–10.5)
Chloride: 102 meq/L (ref 96–112)
Creatinine, Ser: 1.05 mg/dL (ref 0.40–1.50)
GFR: 74.89 mL/min (ref >60.00)
Glucose, Bld: 122 mg/dL — ABNORMAL HIGH (ref 70–99)
Potassium: 4.7 meq/L (ref 3.5–5.1)
Sodium: 142 meq/L (ref 135–145)
Total Bilirubin: 0.6 mg/dL (ref 0.2–1.2)
Total Protein: 6.4 g/dL (ref 6.0–8.3)

## 2024-01-16 LAB — MICROALBUMIN / CREATININE URINE RATIO
Creatinine,U: 207.3 mg/dL
Microalb Creat Ratio: 14.9 mg/g (ref 0.0–30.0)
Microalb, Ur: 3.1 mg/dL — ABNORMAL HIGH (ref 0.0–1.9)

## 2024-01-16 LAB — CBC WITH DIFFERENTIAL/PLATELET
Basophils Absolute: 0.1 K/uL (ref 0.0–0.1)
Basophils Relative: 0.7 % (ref 0.0–3.0)
Eosinophils Absolute: 0.3 K/uL (ref 0.0–0.7)
Eosinophils Relative: 2.6 % (ref 0.0–5.0)
HCT: 42.5 % (ref 39.0–52.0)
Hemoglobin: 14 g/dL (ref 13.0–17.0)
Lymphocytes Relative: 26.4 % (ref 12.0–46.0)
Lymphs Abs: 2.8 K/uL (ref 0.7–4.0)
MCHC: 32.8 g/dL (ref 30.0–36.0)
MCV: 89.9 fl (ref 78.0–100.0)
Monocytes Absolute: 0.4 K/uL (ref 0.1–1.0)
Monocytes Relative: 3.8 % (ref 3.0–12.0)
Neutro Abs: 7 K/uL (ref 1.4–7.7)
Neutrophils Relative %: 66.5 % (ref 43.0–77.0)
Platelets: 314 K/uL (ref 150.0–400.0)
RBC: 4.73 Mil/uL (ref 4.22–5.81)
RDW: 15.2 % (ref 11.5–15.5)
WBC: 10.5 K/uL (ref 4.0–10.5)

## 2024-01-16 LAB — PSA: PSA: 0.57 ng/mL (ref 0.10–4.00)

## 2024-01-16 LAB — VITAMIN D 25 HYDROXY (VIT D DEFICIENCY, FRACTURES): VITD: 33.3 ng/mL (ref 30.00–100.00)

## 2024-01-16 LAB — TSH: TSH: 4.66 u[IU]/mL (ref 0.35–5.50)

## 2024-01-17 ENCOUNTER — Ambulatory Visit: Payer: Self-pay | Admitting: Family Medicine

## 2024-01-17 MED ORDER — NEBIVOLOL HCL 2.5 MG PO TABS: 2.5000 mg | ORAL_TABLET | Freq: Every day | ORAL | 1 refills | Status: AC

## 2024-01-17 NOTE — Assessment & Plan Note (Signed)
 With his 90-day average yielding A1c of 7.3, based on his meter.  He is on follow-up with endocrinology.

## 2024-01-17 NOTE — Assessment & Plan Note (Signed)
 Tetanus 2022 Flu to be done yearly PNA 2016 Shingles d/w pt.   covid vaccine prev done.   Cologuard neg 2024 PSA pending 2025.  Living will d/w pt.  Wife designated if patient were incapacitated.

## 2024-01-17 NOTE — Assessment & Plan Note (Signed)
 Off zetia .  Prev lipids controlled.  Continue with Repatha .

## 2024-01-17 NOTE — Assessment & Plan Note (Signed)
 He can get lightheaded and occasionally short of breath with med changes since he had elevated eye pressure.  Off acetazolamide  now.  Discussed options.  If his labs are unremarkable then it may be reasonable to decrease Bystolic  to 2.5 mg.

## 2024-01-17 NOTE — Assessment & Plan Note (Signed)
 Living will d/w pt.  Wife designated if patient were incapacitated.   ?

## 2024-01-19 ENCOUNTER — Encounter: Payer: Self-pay | Admitting: Family Medicine

## 2024-02-19 ENCOUNTER — Other Ambulatory Visit: Payer: Self-pay | Admitting: Family Medicine

## 2024-02-19 NOTE — Telephone Encounter (Signed)
 LOV: 01/15/24 NOV: nothing scheduled  Last Refill: gabapentin  (NEURONTIN ) 100 MG capsule 12/22/2023 270 capsules

## 2024-02-22 ENCOUNTER — Other Ambulatory Visit: Payer: Self-pay | Admitting: Physician Assistant

## 2024-02-22 DIAGNOSIS — R42 Dizziness and giddiness: Secondary | ICD-10-CM

## 2024-02-22 DIAGNOSIS — R2689 Other abnormalities of gait and mobility: Secondary | ICD-10-CM

## 2024-02-23 ENCOUNTER — Encounter: Payer: Self-pay | Admitting: Physician Assistant

## 2024-02-24 ENCOUNTER — Ambulatory Visit
Admission: RE | Admit: 2024-02-24 | Discharge: 2024-02-24 | Disposition: A | Discharge status: Home / Self Care | Source: Ambulatory Visit | Attending: Physician Assistant | Admitting: Physician Assistant

## 2024-02-24 DIAGNOSIS — R42 Dizziness and giddiness: Secondary | ICD-10-CM

## 2024-02-24 DIAGNOSIS — R2689 Other abnormalities of gait and mobility: Secondary | ICD-10-CM

## 2024-05-06 ENCOUNTER — Encounter: Payer: Self-pay | Admitting: Cardiovascular Disease

## 2024-05-07 ENCOUNTER — Telehealth: Payer: Self-pay | Admitting: Pharmacy Technician

## 2024-05-07 ENCOUNTER — Other Ambulatory Visit (HOSPITAL_COMMUNITY): Payer: Self-pay

## 2024-05-07 NOTE — Telephone Encounter (Signed)
   Pharmacy Patient Advocate Encounter   Received notification from Pt Calls Messages that prior authorization for repatha  is required/requested.   Insurance verification completed.   The patient is insured through [employer].   Per test claim: PA required; PA submitted to above mentioned insurance via Latent Key/confirmation #/EOC [id number] Status is pending

## 2024-05-07 NOTE — Telephone Encounter (Signed)
 Pharmacy Patient Advocate Encounter   Received notification from [employer] that Prior Authorization for repatha  has been APPROVED from 05/07/24 to 07/10/25    PA #/Case ID/Reference #: [id number]

## 2024-06-17 ENCOUNTER — Other Ambulatory Visit: Payer: Self-pay | Admitting: Family Medicine

## 2024-07-01 ENCOUNTER — Other Ambulatory Visit: Payer: Self-pay | Admitting: Cardiology

## 2024-08-12 ENCOUNTER — Other Ambulatory Visit: Payer: Self-pay | Admitting: Family Medicine
